# Patient Record
Sex: Male | Born: 1955
Health system: Southern US, Community
[De-identification: ages and names within clinical notes are randomized; demographics above are authoritative.]

## PROBLEM LIST (undated history)

## (undated) DIAGNOSIS — Z86010 Personal history of colonic polyps: Secondary | ICD-10-CM

## (undated) DIAGNOSIS — N481 Balanitis: Secondary | ICD-10-CM

## (undated) DIAGNOSIS — L309 Dermatitis, unspecified: Secondary | ICD-10-CM

## (undated) DIAGNOSIS — M545 Low back pain, unspecified: Secondary | ICD-10-CM

## (undated) DIAGNOSIS — N400 Enlarged prostate without lower urinary tract symptoms: Secondary | ICD-10-CM

## (undated) DIAGNOSIS — E538 Deficiency of other specified B group vitamins: Secondary | ICD-10-CM

## (undated) DIAGNOSIS — M25562 Pain in left knee: Secondary | ICD-10-CM

## (undated) DIAGNOSIS — K649 Unspecified hemorrhoids: Secondary | ICD-10-CM

## (undated) DIAGNOSIS — K219 Gastro-esophageal reflux disease without esophagitis: Secondary | ICD-10-CM

## (undated) DIAGNOSIS — M179 Osteoarthritis of knee, unspecified: Secondary | ICD-10-CM

## (undated) DIAGNOSIS — M1710 Unilateral primary osteoarthritis, unspecified knee: Secondary | ICD-10-CM

## (undated) DIAGNOSIS — S83249A Other tear of medial meniscus, current injury, unspecified knee, initial encounter: Secondary | ICD-10-CM

## (undated) HISTORY — DX: Pain in left knee: M25.562

## (undated) HISTORY — PX: COLONOSCOPY: SHX174

## (undated) HISTORY — DX: Personal history of colonic polyps: Z86.010

## (undated) HISTORY — DX: Other tear of medial meniscus, current injury, unspecified knee, initial encounter: S83.249A

---

## 1998-07-21 ENCOUNTER — Encounter: Payer: Self-pay | Admitting: Internal Medicine

## 1998-07-21 ENCOUNTER — Other Ambulatory Visit: Admission: RE | Admit: 1998-07-21 | Discharge: 1998-07-21 | Payer: Self-pay | Admitting: Internal Medicine

## 2002-03-14 ENCOUNTER — Ambulatory Visit (HOSPITAL_COMMUNITY): Admission: RE | Admit: 2002-03-14 | Discharge: 2002-03-14 | Payer: Self-pay | Admitting: Internal Medicine

## 2002-03-14 ENCOUNTER — Encounter: Payer: Self-pay | Admitting: Internal Medicine

## 2004-03-20 ENCOUNTER — Encounter: Admission: RE | Admit: 2004-03-20 | Discharge: 2004-03-20 | Payer: Self-pay | Admitting: Internal Medicine

## 2004-11-29 HISTORY — PX: OTHER SURGICAL HISTORY: SHX169

## 2005-05-06 ENCOUNTER — Ambulatory Visit: Payer: Self-pay | Admitting: Internal Medicine

## 2005-10-13 ENCOUNTER — Ambulatory Visit: Payer: Self-pay | Admitting: Internal Medicine

## 2005-12-16 ENCOUNTER — Ambulatory Visit: Payer: Self-pay | Admitting: Internal Medicine

## 2005-12-24 ENCOUNTER — Ambulatory Visit: Payer: Self-pay | Admitting: Cardiology

## 2006-02-17 ENCOUNTER — Ambulatory Visit: Payer: Self-pay | Admitting: Internal Medicine

## 2006-03-18 ENCOUNTER — Ambulatory Visit: Payer: Self-pay | Admitting: Internal Medicine

## 2006-03-23 ENCOUNTER — Encounter (INDEPENDENT_AMBULATORY_CARE_PROVIDER_SITE_OTHER): Payer: Self-pay | Admitting: *Deleted

## 2006-03-23 ENCOUNTER — Ambulatory Visit: Payer: Self-pay | Admitting: Internal Medicine

## 2006-03-30 ENCOUNTER — Encounter: Payer: Self-pay | Admitting: Internal Medicine

## 2006-05-04 ENCOUNTER — Ambulatory Visit: Payer: Self-pay | Admitting: Internal Medicine

## 2006-09-06 ENCOUNTER — Ambulatory Visit: Payer: Self-pay | Admitting: Internal Medicine

## 2006-09-13 ENCOUNTER — Ambulatory Visit: Payer: Self-pay | Admitting: Internal Medicine

## 2007-02-03 ENCOUNTER — Ambulatory Visit: Payer: Self-pay | Admitting: Internal Medicine

## 2007-02-03 LAB — CONVERTED CEMR LAB
ALT: 17 units/L (ref 0–40)
AST: 17 units/L (ref 0–37)
Albumin: 4.1 g/dL (ref 3.5–5.2)
Alkaline Phosphatase: 77 units/L (ref 39–117)
BUN: 16 mg/dL (ref 6–23)
Basophils Absolute: 0 10*3/uL (ref 0.0–0.1)
Basophils Relative: 0.3 % (ref 0.0–1.0)
Bilirubin, Direct: 0.2 mg/dL (ref 0.0–0.3)
CO2: 32 meq/L (ref 19–32)
Calcium: 9.3 mg/dL (ref 8.4–10.5)
Chloride: 103 meq/L (ref 96–112)
Creatinine, Ser: 1.1 mg/dL (ref 0.4–1.5)
Eosinophils Absolute: 0 10*3/uL (ref 0.0–0.6)
Eosinophils Relative: 1.2 % (ref 0.0–5.0)
GFR calc Af Amer: 91 mL/min
GFR calc non Af Amer: 75 mL/min
Glucose, Bld: 96 mg/dL (ref 70–99)
HCT: 43.3 % (ref 39.0–52.0)
Hemoglobin: 14.6 g/dL (ref 13.0–17.0)
Lymphocytes Relative: 36.9 % (ref 12.0–46.0)
MCHC: 33.8 g/dL (ref 30.0–36.0)
MCV: 91.2 fL (ref 78.0–100.0)
Monocytes Absolute: 0.4 10*3/uL (ref 0.2–0.7)
Monocytes Relative: 16.3 % — ABNORMAL HIGH (ref 3.0–11.0)
Neutro Abs: 1.2 10*3/uL — ABNORMAL LOW (ref 1.4–7.7)
Neutrophils Relative %: 45.3 % (ref 43.0–77.0)
Platelets: 239 10*3/uL (ref 150–400)
Potassium: 4.8 meq/L (ref 3.5–5.1)
RBC: 4.75 M/uL (ref 4.22–5.81)
RDW: 12.8 % (ref 11.5–14.6)
Sed Rate: 6 mm/hr (ref 0–20)
Sodium: 139 meq/L (ref 135–145)
TSH: 1.91 microintl units/mL (ref 0.35–5.50)
Total Bilirubin: 1.4 mg/dL — ABNORMAL HIGH (ref 0.3–1.2)
Total Protein: 7.1 g/dL (ref 6.0–8.3)
WBC: 2.6 10*3/uL — ABNORMAL LOW (ref 4.5–10.5)

## 2007-02-09 ENCOUNTER — Ambulatory Visit: Payer: Self-pay | Admitting: Cardiology

## 2007-02-23 ENCOUNTER — Ambulatory Visit: Payer: Self-pay | Admitting: Endocrinology

## 2007-03-14 ENCOUNTER — Ambulatory Visit: Payer: Self-pay | Admitting: Internal Medicine

## 2007-03-29 ENCOUNTER — Ambulatory Visit: Payer: Self-pay | Admitting: Internal Medicine

## 2007-06-13 ENCOUNTER — Ambulatory Visit: Payer: Self-pay | Admitting: Internal Medicine

## 2007-08-03 DIAGNOSIS — R6889 Other general symptoms and signs: Secondary | ICD-10-CM | POA: Insufficient documentation

## 2007-10-21 ENCOUNTER — Ambulatory Visit: Payer: Self-pay | Admitting: Internal Medicine

## 2007-10-21 DIAGNOSIS — M6580 Other synovitis and tenosynovitis, unspecified site: Secondary | ICD-10-CM | POA: Insufficient documentation

## 2008-04-23 ENCOUNTER — Ambulatory Visit: Payer: Self-pay | Admitting: Endocrinology

## 2008-04-23 DIAGNOSIS — K649 Unspecified hemorrhoids: Secondary | ICD-10-CM | POA: Insufficient documentation

## 2008-04-23 DIAGNOSIS — L723 Sebaceous cyst: Secondary | ICD-10-CM | POA: Insufficient documentation

## 2008-04-29 ENCOUNTER — Encounter: Payer: Self-pay | Admitting: Internal Medicine

## 2008-04-30 ENCOUNTER — Ambulatory Visit: Payer: Self-pay | Admitting: Internal Medicine

## 2008-04-30 DIAGNOSIS — K219 Gastro-esophageal reflux disease without esophagitis: Secondary | ICD-10-CM | POA: Insufficient documentation

## 2008-04-30 DIAGNOSIS — R634 Abnormal weight loss: Secondary | ICD-10-CM | POA: Insufficient documentation

## 2008-05-22 ENCOUNTER — Encounter: Payer: Self-pay | Admitting: Internal Medicine

## 2008-07-26 ENCOUNTER — Ambulatory Visit: Payer: Self-pay | Admitting: Internal Medicine

## 2008-07-26 LAB — CONVERTED CEMR LAB
BUN: 24 mg/dL — ABNORMAL HIGH (ref 6–23)
Creatinine, Ser: 1.1 mg/dL (ref 0.4–1.5)
GFR calc Af Amer: 91 mL/min
Glucose, Bld: 96 mg/dL (ref 70–99)
Potassium: 4.6 meq/L (ref 3.5–5.1)

## 2008-07-29 ENCOUNTER — Ambulatory Visit: Payer: Self-pay | Admitting: Internal Medicine

## 2008-07-29 DIAGNOSIS — B009 Herpesviral infection, unspecified: Secondary | ICD-10-CM | POA: Insufficient documentation

## 2008-07-29 DIAGNOSIS — M549 Dorsalgia, unspecified: Secondary | ICD-10-CM | POA: Insufficient documentation

## 2008-10-07 ENCOUNTER — Ambulatory Visit: Payer: Self-pay | Admitting: Internal Medicine

## 2008-10-07 DIAGNOSIS — H5710 Ocular pain, unspecified eye: Secondary | ICD-10-CM | POA: Insufficient documentation

## 2008-11-11 ENCOUNTER — Ambulatory Visit: Payer: Self-pay | Admitting: Internal Medicine

## 2009-04-12 ENCOUNTER — Ambulatory Visit: Payer: Self-pay | Admitting: Family Medicine

## 2009-04-12 DIAGNOSIS — B369 Superficial mycosis, unspecified: Secondary | ICD-10-CM | POA: Insufficient documentation

## 2009-04-25 ENCOUNTER — Ambulatory Visit: Payer: Self-pay | Admitting: Internal Medicine

## 2009-04-25 DIAGNOSIS — R071 Chest pain on breathing: Secondary | ICD-10-CM | POA: Insufficient documentation

## 2009-04-25 DIAGNOSIS — IMO0002 Reserved for concepts with insufficient information to code with codable children: Secondary | ICD-10-CM | POA: Insufficient documentation

## 2009-06-03 ENCOUNTER — Ambulatory Visit: Payer: Self-pay | Admitting: Internal Medicine

## 2009-06-03 DIAGNOSIS — E538 Deficiency of other specified B group vitamins: Secondary | ICD-10-CM | POA: Insufficient documentation

## 2009-06-03 DIAGNOSIS — M25539 Pain in unspecified wrist: Secondary | ICD-10-CM | POA: Insufficient documentation

## 2009-06-04 LAB — CONVERTED CEMR LAB
TSH: 2 microintl units/mL (ref 0.35–5.50)
Vitamin B-12: 262 pg/mL (ref 211–911)

## 2009-06-06 ENCOUNTER — Encounter: Payer: Self-pay | Admitting: Internal Medicine

## 2009-06-06 LAB — CONVERTED CEMR LAB: Vit D, 25-Hydroxy: 7 ng/mL — ABNORMAL LOW (ref 30–89)

## 2010-01-01 ENCOUNTER — Ambulatory Visit: Payer: Self-pay | Admitting: Internal Medicine

## 2010-01-01 DIAGNOSIS — M79609 Pain in unspecified limb: Secondary | ICD-10-CM | POA: Insufficient documentation

## 2010-01-01 LAB — CONVERTED CEMR LAB
Sed Rate: 3 mm/hr (ref 0–22)
Uric Acid, Serum: 5.5 mg/dL (ref 4.0–7.8)

## 2010-01-05 ENCOUNTER — Telehealth: Payer: Self-pay | Admitting: Internal Medicine

## 2010-01-06 ENCOUNTER — Encounter: Payer: Self-pay | Admitting: Internal Medicine

## 2010-01-16 ENCOUNTER — Ambulatory Visit: Payer: Self-pay | Admitting: Internal Medicine

## 2010-01-23 ENCOUNTER — Ambulatory Visit: Payer: Self-pay | Admitting: Internal Medicine

## 2010-01-23 DIAGNOSIS — N400 Enlarged prostate without lower urinary tract symptoms: Secondary | ICD-10-CM | POA: Insufficient documentation

## 2010-01-23 DIAGNOSIS — K921 Melena: Secondary | ICD-10-CM | POA: Insufficient documentation

## 2010-01-26 ENCOUNTER — Encounter (INDEPENDENT_AMBULATORY_CARE_PROVIDER_SITE_OTHER): Payer: Self-pay | Admitting: *Deleted

## 2010-01-26 ENCOUNTER — Telehealth: Payer: Self-pay | Admitting: Internal Medicine

## 2010-01-27 ENCOUNTER — Ambulatory Visit: Payer: Self-pay | Admitting: Internal Medicine

## 2010-01-27 DIAGNOSIS — K625 Hemorrhage of anus and rectum: Secondary | ICD-10-CM | POA: Insufficient documentation

## 2010-01-27 DIAGNOSIS — R9389 Abnormal findings on diagnostic imaging of other specified body structures: Secondary | ICD-10-CM | POA: Insufficient documentation

## 2010-01-27 DIAGNOSIS — N509 Disorder of male genital organs, unspecified: Secondary | ICD-10-CM | POA: Insufficient documentation

## 2010-01-27 DIAGNOSIS — R350 Frequency of micturition: Secondary | ICD-10-CM | POA: Insufficient documentation

## 2010-02-04 ENCOUNTER — Encounter: Payer: Self-pay | Admitting: Internal Medicine

## 2010-02-19 ENCOUNTER — Encounter: Payer: Self-pay | Admitting: Internal Medicine

## 2010-03-03 ENCOUNTER — Ambulatory Visit: Payer: Self-pay | Admitting: Internal Medicine

## 2010-03-03 DIAGNOSIS — N433 Hydrocele, unspecified: Secondary | ICD-10-CM | POA: Insufficient documentation

## 2010-03-03 DIAGNOSIS — Z860100 Personal history of colon polyps, unspecified: Secondary | ICD-10-CM

## 2010-03-03 DIAGNOSIS — R351 Nocturia: Secondary | ICD-10-CM | POA: Insufficient documentation

## 2010-03-03 HISTORY — DX: Personal history of colonic polyps: Z86.010

## 2010-03-03 HISTORY — DX: Personal history of colon polyps, unspecified: Z86.0100

## 2010-03-16 ENCOUNTER — Ambulatory Visit: Payer: Self-pay | Admitting: Internal Medicine

## 2010-03-19 ENCOUNTER — Encounter: Payer: Self-pay | Admitting: Internal Medicine

## 2010-03-23 ENCOUNTER — Ambulatory Visit: Payer: Self-pay | Admitting: Internal Medicine

## 2010-03-23 DIAGNOSIS — D235 Other benign neoplasm of skin of trunk: Secondary | ICD-10-CM | POA: Insufficient documentation

## 2010-03-23 DIAGNOSIS — L309 Dermatitis, unspecified: Secondary | ICD-10-CM | POA: Insufficient documentation

## 2010-10-19 ENCOUNTER — Ambulatory Visit: Payer: Self-pay | Admitting: Internal Medicine

## 2010-10-19 DIAGNOSIS — B029 Zoster without complications: Secondary | ICD-10-CM | POA: Insufficient documentation

## 2010-10-19 DIAGNOSIS — M542 Cervicalgia: Secondary | ICD-10-CM | POA: Insufficient documentation

## 2010-10-19 DIAGNOSIS — M25569 Pain in unspecified knee: Secondary | ICD-10-CM | POA: Insufficient documentation

## 2010-10-29 ENCOUNTER — Encounter: Payer: Self-pay | Admitting: Internal Medicine

## 2010-11-03 ENCOUNTER — Ambulatory Visit: Payer: Self-pay | Admitting: Internal Medicine

## 2010-11-04 ENCOUNTER — Ambulatory Visit: Payer: Self-pay | Admitting: Internal Medicine

## 2010-11-04 LAB — CONVERTED CEMR LAB
Albumin: 4.1 g/dL (ref 3.5–5.2)
Basophils Relative: 0.9 % (ref 0.0–3.0)
CO2: 28 meq/L (ref 19–32)
Chloride: 108 meq/L (ref 96–112)
Cholesterol: 228 mg/dL — ABNORMAL HIGH (ref 0–200)
Eosinophils Absolute: 0 10*3/uL (ref 0.0–0.7)
Hemoglobin: 15.2 g/dL (ref 13.0–17.0)
Lymphs Abs: 1 10*3/uL (ref 0.7–4.0)
MCHC: 33.9 g/dL (ref 30.0–36.0)
MCV: 91.7 fL (ref 78.0–100.0)
Monocytes Absolute: 0.4 10*3/uL (ref 0.1–1.0)
Neutro Abs: 1.1 10*3/uL — ABNORMAL LOW (ref 1.4–7.7)
PSA: 2.6 ng/mL (ref 0.10–4.00)
RBC: 4.89 M/uL (ref 4.22–5.81)
Sodium: 144 meq/L (ref 135–145)
TSH: 1.74 microintl units/mL (ref 0.35–5.50)
Total CHOL/HDL Ratio: 3
Total Protein: 6.9 g/dL (ref 6.0–8.3)
Urine Glucose: NEGATIVE mg/dL
Urobilinogen, UA: 0.2 (ref 0.0–1.0)

## 2010-11-13 ENCOUNTER — Ambulatory Visit
Admission: RE | Admit: 2010-11-13 | Discharge: 2010-11-13 | Payer: Self-pay | Source: Home / Self Care | Attending: Urology | Admitting: Urology

## 2010-11-13 HISTORY — PX: OTHER SURGICAL HISTORY: SHX169

## 2010-11-27 ENCOUNTER — Encounter: Payer: Self-pay | Admitting: Internal Medicine

## 2010-12-31 NOTE — Letter (Signed)
Summary: [facility] Instructions  [facility]  [address] [place], [place] [address]   Phone: [phone]  Fax: [phone]       Jorge Turner    [birth year]    MRN: [id number]        Procedure Day /Date:MONDAY, 03/16/10     Arrival Time:1:30 PM     Procedure Time:2:30 PM     Location of Procedure:                    X  [facility] (4th Floor)                        PREPARATION FOR COLONOSCOPY WITH MOVIPREP   Starting 5 days prior to your procedure 03/11/10 do not eat nuts, seeds, popcorn, corn, beans, peas,  salads, or any raw vegetables.  Do not take any fiber supplements (e.g. Metamucil, Citrucel, and Benefiber).  THE DAY BEFORE YOUR PROCEDURE         DATE: 03/15/10  DAY: SUNDAY  1.  Drink clear liquids the entire day-NO SOLID FOOD  2.  Do not drink anything colored red or purple.  Avoid juices with pulp.  No orange juice.  3.  Drink at least 64 oz. (8 glasses) of fluid/clear liquids during the day to prevent dehydration and help the prep work efficiently.  CLEAR LIQUIDS INCLUDE: Water Jello Ice Popsicles Tea (sugar ok, no milk/cream) Powdered fruit flavored drinks Coffee (sugar ok, no milk/cream) Gatorade Juice: apple, white grape, white cranberry  Lemonade Clear bullion, consomm, broth Carbonated beverages (any kind) Strained chicken noodle soup Hard Candy                             4.  In the morning, mix first dose of MoviPrep solution:    Empty 1 Pouch A and 1 Pouch B into the disposable container    Add lukewarm drinking water to the top line of the container. Mix to dissolve    Refrigerate (mixed solution should be used within 24 hrs)  5.  Begin drinking the prep at 5:00 p.m. The MoviPrep container is divided by 4 marks.   Every 15 minutes drink the solution down to the next mark (approximately 8 oz) until the full liter is complete.   6.  Follow completed prep with 16 oz of clear liquid of your choice (Nothing red or  purple).  Continue to drink clear liquids until bedtime.  7.  Before going to bed, mix second dose of MoviPrep solution:    Empty 1 Pouch A and 1 Pouch B into the disposable container    Add lukewarm drinking water to the top line of the container. Mix to dissolve    Refrigerate  THE DAY OF YOUR PROCEDURE      DATE: 03/16/10 DAY: MONDAY  Beginning at 9:30 a.m. (5 hours before procedure):         1. Every 15 minutes, drink the solution down to the next mark (approx 8 oz) until the full liter is complete.  2. Follow completed prep with 16 oz. of clear liquid of your choice.    3. You may drink clear liquids until 12:30 PM(2 HOURS BEFORE PROCEDURE).   MEDICATION INSTRUCTIONS  Unless otherwise instructed, you should take regular prescription medications with a small sip of water   as early as possible the morning of your procedure.  OTHER INSTRUCTIONS  You will need a responsible adult at least 54 years of age to accompany you and drive you home.   This person must remain in the waiting room during your procedure.  Wear loose fitting clothing that is easily removed.  Leave jewelry and other valuables at home.  However, you may wish to bring a book to read or  an iPod/MP3 player to listen to music as you wait for your procedure to start.  Remove all body piercing jewelry and leave at home.  Total time from sign-in until discharge is approximately 2-3 hours.  You should go home directly after your procedure and rest.  You can resume normal activities the  day after your procedure.  The day of your procedure you should not:   Drive   Make legal decisions   Operate machinery   Drink alcohol   Return to work  You will receive specific instructions about eating, activities and medications before you leave.    The above instructions have been reviewed and explained to me by   _______________________    I fully understand and can verbalize these instructions  _____________________________ Date _________

## 2010-12-31 NOTE — Procedures (Signed)
Summary: Colonoscopy  Patient: [name] Note: All result statuses are Final unless otherwise noted.  Tests: (1) Colonoscopy (COL)   COL Colonoscopy           DONE     [facility]     [address]     [place], [place]  [address]           COLONOSCOPY PROCEDURE REPORT           PATIENT:  Jorge, Turner  MR#:  [id number]     BIRTHDATE:  December 10, 1955, 54 yrs. old  GENDER:  male     ENDOSCOPIST:  Wilhemina Bonito. Eda Keys, MD     REF. BY:  Office     PROCEDURE DATE:  03/16/2010     PROCEDURE:  Colonoscopy with snare polypectomy     ASA CLASS:  Class I     INDICATIONS:  rectal bleeding ; hx nonadenomatous polyp 2007     MEDICATIONS:   Fentanyl 75 mcg IV, Versed 8 mg IV           DESCRIPTION OF PROCEDURE:   After the risks benefits and     alternatives of the procedure were thoroughly explained, informed     consent was obtained.  Digital rectal exam was performed and     revealed no abnormalities.   The LB CF-[id number] endoscope     was introduced through the anus and advanced to the cecum, which     was identified by both the appendix and ileocecal valve, without     limitations. Time to cecum = 1:55 min.The quality of the prep was     good, using MoviPrep.  The instrument was then slowly withdrawn     (time = 10:17 min) as the colon was fully examined.     <<PROCEDUREIMAGES>>           FINDINGS:  Severe diverticulosis was found throughout the colon.     A diminutive polyp was found in the ascending colon. Polyp was     snared without cautery. Retrieval was successful. snare polyp     Retroflexed views in the rectum revealed internal hemorrhoids.     The scope was then withdrawn from the patient and the procedure     completed.           COMPLICATIONS:  None     ENDOSCOPIC IMPRESSION:     1) Severe diverticulosis throughout the colon     2) Diminutive polyp in the ascending colon     3) Internal hemorrhoids           RECOMMENDATIONS:     1) Repeat colonoscopy in 5 years  if polyp adenomatous; otherwise     10 years           ______________________________     Wilhemina Bonito. Eda Keys, MD           CC:  The Patient; Oliver Barre, MD           n.     Rosalie DoctorWilhemina Bonito. Eda Keys at 03/16/2010 03:23 PM           [name], [id number]  Note: An exclamation mark (!) indicates a result that was not dispersed into the flowsheet. Document Creation Date: 03/16/2010 3:24 PM _______________________________________________________________________  (1) Order result status: Final Collection or observation date-time: 03/16/2010 15:14 Requested date-time:  Receipt date-time:  Reported date-time:  Referring Physician:   Ordering Physician: Yancey Flemings  Montez Hageman [phone]) Specimen Source:  Source: Launa Grill Order Number: [id number] Lab site:   Appended Document: Colonoscopy recall 10 yrs/02-2020     Procedures Next Due Date:    Colonoscopy: 02/2020

## 2010-12-31 NOTE — Medication Information (Signed)
Summary: Prior authorization/[employer]  Prior authorization/[employer]   Imported By: Lester  01/07/2010 08:45:19  _____________________________________________________________________  External Attachment:    Type:   Image     Comment:   External Document
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
## 2010-12-31 NOTE — Consult Note (Signed)
Summary: [facility]   Imported By: Sherian Rein 02/16/2010 08:02:38  _____________________________________________________________________  External Attachment:    Type:   Image     Comment:   External Document

## 2010-12-31 NOTE — Letter (Signed)
Summary: [facility]  [facility]   Imported By: Lennie Odor 12/04/2010 14:30:02  _____________________________________________________________________  External Attachment:    Type:   Image     Comment:   External Document

## 2010-12-31 NOTE — Letter (Signed)
Summary: [facility]  [facility]   Imported By: Sherian Rein 03/02/2010 08:01:41  _____________________________________________________________________  External Attachment:    Type:   Image     Comment:   External Document

## 2010-12-31 NOTE — Progress Notes (Signed)
Summary: Appt sooner than next available   Phone Note From Other Clinic   Caller: Corrie Dandy [phone] @ DR Jonny Ruiz Call For: Dr Marina Goodell Reason for Call: Schedule Patient Appt Summary of Call: Hematochezia wonders if he can be worked in a little sooner than 03-05-10. Call back to [facility] in primary care. Initial call taken by: Leanor Kail [facility],  January 26, 2010 8:50 AM  Follow-up for Phone Call         Pt. given Appt. with Gunnar Fusi Guenther,N.P. for tomorrow.Is aware. Follow-up by: Teryl Lucy RN,  January 26, 2010 10:15 AM

## 2010-12-31 NOTE — Letter (Signed)
Summary: New Patient letter  [facility]  [address] [place], [place] [address]   Phone: [phone]  Fax: [phone]       01/26/2010 MRN: [id number]  [facility] [address] [place], [place]  [address]  Dear Mr. LOVER,  Welcome to the Gastroenterology Division at [facility].    You are scheduled to see Dr.  Marina Goodell on 03-05-10 at  9:30AM _ on the 3rd floor at [facility], [address]  We ask that you try to arrive at our office 15 minutes prior to your appointment time to allow for check-in.  We would like you to complete the enclosed self-administered evaluation form prior to your visit and bring it with you on the day of your appointment.  We will review it with you.  Also, please bring a complete list of all your medications or, if you prefer, bring the medication bottles and we will list them.  Please bring your insurance card so that we may make a copy of it.  If your insurance requires a referral to see a specialist, please bring your referral form from your primary care physician.  Co-payments are due at the time of your visit and may be paid by cash, check or credit card.     Your office visit will consist of a consult with your physician (includes a physical exam), any laboratory testing he/she may order, scheduling of any necessary diagnostic testing (e.g. x-ray, ultrasound, CT-scan), and scheduling of a procedure (e.g. Endoscopy, Colonoscopy) if required.  Please allow enough time on your schedule to allow for any/all of these possibilities.    If you cannot keep your appointment, please call [phone] to cancel or reschedule prior to your appointment date.  This allows [place] the opportunity to schedule an appointment for another patient in need of care.  If you do not cancel or reschedule by 5 p.m. the business day prior to your appointment date, you will be charged a $50.00 late cancellation/no-show fee.    Thank you for choosing  [facility] for your medical needs.  We appreciate the opportunity to care for you.  Please visit [place] at our website  to learn more about our practice.                     Sincerely,                                                             The Gastroenterology Division
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
## 2010-12-31 NOTE — Progress Notes (Signed)
Summary: Meloxicam PA  Phone Note From Pharmacy   Caller: [employer] [phone] Call For: ID:  [id number]  Summary of Call: PA request--Meloxicam. They will fax form. Initial call taken by: Lucious Groves,  January 05, 2010 10:24 AM  Follow-up for Phone Call        Completed. What is the expected duration of the Meloxicam therapy? Please advise. Follow-up by: Lucious Groves,  January 05, 2010 11:31 AM  Additional Follow-up for Phone Call Additional follow up Details #1::        1-2 months  Additional Follow-up by: Tresa Garter MD,  January 05, 2010 11:51 AM    Additional Follow-up for Phone Call Additional follow up Details #2::    form faxed, will wait for reply. Follow-up by: Lucious Groves,  January 06, 2010 11:55 AM  Additional Follow-up for Phone Call Additional follow up Details #3:: Details for Additional Follow-up Action Taken: Called to check on status and it is denied due to non covered condition. Please advise. Lucious Groves  January 08, 2010 1:05 PM  OK Naproxen Tresa Garter MD  January 09, 2010 7:54 AM   Pt informed  Additional Follow-up by: Lamar Sprinkles, CMA,  January 09, 2010 9:49 AM  New/Updated Medications: NAPROXEN 500 MG TABS (NAPROXEN) 1 by mouth two times a day pc for pain/arthritis Prescriptions: NAPROXEN 500 MG TABS (NAPROXEN) 1 by mouth two times a day pc for pain/arthritis  #60 x 3   Entered and Authorized by:   Tresa Garter MD   Signed by:   Lamar Sprinkles, CMA on 01/09/2010   Method used:   [facility]        [facility] [address] [phone]* (retail)       [address]       [place], [place]  [address]       Ph: [phone] or [phone]       Fax: [phone]   RxID:   [id number]

## 2010-12-31 NOTE — Procedures (Signed)
Summary: LEC COLON   Colonoscopy  Procedure date:  03/23/2006  Findings:      Location:  [facility].    Procedures Next Due Date:    Colonoscopy: 03/2011 Patient Name: Jorge Turner, Jorge Turner MRN:  Procedure Procedures: Colonoscopy CPT: 95188.    with polypectomy. CPT: A3573898.  Personnel: Endoscopist: Wilhemina Bonito. Marina Goodell, MD.  Referred By: Linda Hedges Plotnikov, MD.  Exam Location: Exam performed in Outpatient Clinic. Outpatient  Patient Consent: Procedure, Alternatives, Risks and Benefits discussed, consent obtained, from patient. Consent was obtained by the RN.  Indications Symptoms: Hematochezia.  Comments: Colonoscopy 06-1998 revealed diverticulosis History  Current Medications: Patient is not currently taking Coumadin.  Pre-Exam Physical: Performed Mar 23, 2006. Cardio-pulmonary exam, Rectal exam, HEENT exam , Mental status exam WNL.  Comments: Pt. history reviewed/updated, physical exam performed prior to initiation of sedation?yes Exam Exam: Extent of exam reached: Cecum, extent intended: Cecum.  The cecum was identified by appendiceal orifice and IC valve. Patient position: on left side. The Cecum was reached at 12:24 PM. ended at 12:35 PM. Time for Withdrawl: 00:11. Colon retroflexion performed. Images taken. ASA Classification: I. Tolerance: excellent.  Monitoring: Pulse and BP monitoring, Oximetry used. Supplemental O2 given.  Colon Prep Used osmo prep for colon prep. Prep results: excellent.  Sedation Meds: Patient assessed and found to be appropriate for moderate (conscious) sedation. Fentanyl 50 mcg. given IV. Versed 6 mg. given IV.  Findings NORMAL EXAM: Cecum to Rectum.  POLYP: Cecum, Maximum size: 5 mm. pedunculated polyp. Procedure:  snare without cautery, removed, retrieved, Polyp sent to pathology. ICD9: Colon Polyps: 211.3. Comments: mild oozing at base cauterized.  - DIVERTICULOSIS: Cecum to Sigmoid Colon. ICD9: Diverticulosis,  Colon: 562.10. Comments: moderate changes.   Assessment  Diagnoses: 562.10: Diverticulosis, Colon.  211.3: Colon Polyps.   Events  Unplanned Interventions: No intervention was required.  Unplanned Events: There were no complications. Plans Disposition: After procedure patient sent to recovery. After recovery patient sent home.  Scheduling/Referral: Colonoscopy, to Wilhemina Bonito. Marina Goodell, MD, in 5 years,    cc:  Linda Hedges. Plotnikov, MD  This report was created from the original endoscopy report, which was reviewed and signed by the above listed endoscopist.

## 2010-12-31 NOTE — Letter (Signed)
Summary: Patient Notice- Polyp Results  [facility]  [address] [place], [place] [address]   Phone: [phone]  Fax: [phone]        March 19, 2010 MRN: [id number]    [facility] [address] [place], [place]  [address]    Dear Mr. ROBISON,  I am pleased to inform you that the colon polyp(s) removed during your recent colonoscopy was (were) found to be benign (no cancer detected) upon pathologic examination.  I recommend you have a repeat colonoscopy examination in 10 years to look for recurrent polyps, as having colon polyps increases your risk for having recurrent polyps or even colon cancer in the future.  Should you develop new or worsening symptoms of abdominal pain, bowel habit changes or bleeding from the rectum or bowels, please schedule an evaluation with either your primary care physician or with me.  Additional information/recommendations:  __ No further action with gastroenterology is needed at this time. Please      follow-up with your primary care physician for your other healthcare      needs.   Please call us if you are having persistent problems or have questions about your condition that have not been fully answered at this time.  Sincerely,  Hilarie Fredrickson MD  This letter has been electronically signed by your physician.  Appended Document: Patient Notice- Polyp Results letter mailed 4.26.11

## 2010-12-31 NOTE — Procedures (Signed)
Summary: [facility]   Imported By: Sherian Rein 03/04/2010 10:56:20  _____________________________________________________________________  External Attachment:    Type:   Image     Comment:   External Document

## 2010-12-31 NOTE — Letter (Signed)
Summary: [facility]  [facility]   Imported By: Lennie Odor 11/03/2010 15:16:41  _____________________________________________________________________  External Attachment:    Type:   Image     Comment:   External Document
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
## 2011-01-25 ENCOUNTER — Ambulatory Visit (INDEPENDENT_AMBULATORY_CARE_PROVIDER_SITE_OTHER): Payer: BC Managed Care – PPO | Admitting: Internal Medicine

## 2011-01-25 ENCOUNTER — Encounter: Payer: Self-pay | Admitting: Internal Medicine

## 2011-01-25 DIAGNOSIS — Z0000 Encounter for general adult medical examination without abnormal findings: Secondary | ICD-10-CM
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
## 2011-02-08 LAB — POCT HEMOGLOBIN-HEMACUE: Hemoglobin: 15.5 g/dL (ref 13.0–17.0)

## 2011-04-05 ENCOUNTER — Ambulatory Visit (INDEPENDENT_AMBULATORY_CARE_PROVIDER_SITE_OTHER): Payer: BC Managed Care – PPO | Admitting: Internal Medicine

## 2011-04-05 ENCOUNTER — Encounter: Payer: Self-pay | Admitting: Internal Medicine

## 2011-04-05 DIAGNOSIS — K219 Gastro-esophageal reflux disease without esophagitis: Secondary | ICD-10-CM

## 2011-04-05 DIAGNOSIS — R109 Unspecified abdominal pain: Secondary | ICD-10-CM | POA: Insufficient documentation

## 2011-04-05 DIAGNOSIS — R110 Nausea: Secondary | ICD-10-CM

## 2011-04-05 DIAGNOSIS — R519 Headache, unspecified: Secondary | ICD-10-CM | POA: Insufficient documentation

## 2011-04-05 DIAGNOSIS — R51 Headache: Secondary | ICD-10-CM

## 2011-04-05 DIAGNOSIS — J029 Acute pharyngitis, unspecified: Secondary | ICD-10-CM

## 2011-04-05 DIAGNOSIS — R1031 Right lower quadrant pain: Secondary | ICD-10-CM

## 2011-04-05 MED ORDER — OMEPRAZOLE 40 MG PO CPDR: 40.0000 mg | DELAYED_RELEASE_CAPSULE | Freq: Every day | ORAL | Status: DC

## 2011-04-05 MED ORDER — BUTALBITAL-APAP-CAFFEINE 50-325-40 MG PO TABS: 1.0000 | ORAL_TABLET | Freq: Two times a day (BID) | ORAL | Status: AC | PRN

## 2011-04-05 MED ORDER — AMOXICILLIN-POT CLAVULANATE 875-125 MG PO TABS: 1.0000 | ORAL_TABLET | Freq: Two times a day (BID) | ORAL | Status: AC

## 2011-04-05 NOTE — Assessment & Plan Note (Signed)
Worse - restart Rx

## 2011-04-05 NOTE — Patient Instructions (Signed)
Come back if not better

## 2011-04-05 NOTE — Assessment & Plan Note (Signed)
RTC if not better or if worse

## 2011-04-05 NOTE — Assessment & Plan Note (Signed)
Sinusitis vs migraine.

## 2011-04-05 NOTE — Assessment & Plan Note (Signed)
Better off Doxy

## 2011-04-05 NOTE — Assessment & Plan Note (Signed)
We will use another abx

## 2011-04-05 NOTE — Progress Notes (Signed)
  Subjective:    Patient ID: Jorge Turner, male    DOB: 09-11-1956, 55 y.o.   MRN: [id number]  HPI   C/o HA C/o sinus infection Dr Donnetta Simpers gave him Doxy - he got very nauseated - he stopped after 4 days C/o RLQ discomfort and GERD sx's  Review of Systems  Constitutional: Positive for appetite change (not hungry). Negative for fever and chills.  HENT: Negative for ear pain, nosebleeds, rhinorrhea, sneezing, neck pain and postnasal drip.   Eyes: Negative for discharge.  Gastrointestinal: Positive for nausea and abdominal pain (mild). Negative for vomiting and abdominal distention.  Genitourinary: Negative for flank pain.  Skin: Negative for rash.  Psychiatric/Behavioral: Negative for dysphoric mood.       Objective:   Physical Exam  Constitutional: He is oriented to person, place, and time. He appears well-developed. No distress.  HENT:  Mouth/Throat: Oropharynx is clear and moist. No oropharyngeal exudate.       eryth pharynx  Eyes: Conjunctivae are normal. Pupils are equal, round, and reactive to light.  Neck: Normal range of motion. No JVD present. No thyromegaly present.  Cardiovascular: Normal rate, regular rhythm, normal heart sounds and intact distal pulses.  Exam reveals no gallop and no friction rub.   No murmur heard. Pulmonary/Chest: Effort normal and breath sounds normal. No respiratory distress. He has no wheezes. He has no rales. He exhibits no tenderness.  Abdominal: Soft. Bowel sounds are normal. He exhibits no distension and no mass. There is no tenderness. There is no rebound and no guarding.       RLQ is hardly sensitive to deep palpation  Musculoskeletal: Normal range of motion. He exhibits no edema and no tenderness.  Lymphadenopathy:    He has no cervical adenopathy.  Neurological: He is alert and oriented to person, place, and time. He has normal reflexes. No cranial nerve deficit. He exhibits normal muscle tone. Coordination normal.  Skin: Skin is warm and  dry. No rash noted.  Psychiatric: He has a normal mood and affect. His behavior is normal. Judgment and thought content normal.          Assessment & Plan:  PHARYNGITIS We will use another abx  Nausea Better off Doxy  Headache Sinusitis vs migraine.  RLQ abdominal pain RTC if not better or if worse  GERD Worse - restart Rx

## 2011-06-01 ENCOUNTER — Ambulatory Visit (INDEPENDENT_AMBULATORY_CARE_PROVIDER_SITE_OTHER): Payer: BC Managed Care – PPO | Admitting: Internal Medicine

## 2011-06-01 ENCOUNTER — Other Ambulatory Visit: Payer: Self-pay | Admitting: Internal Medicine

## 2011-06-01 ENCOUNTER — Encounter: Payer: Self-pay | Admitting: Internal Medicine

## 2011-06-01 ENCOUNTER — Other Ambulatory Visit (INDEPENDENT_AMBULATORY_CARE_PROVIDER_SITE_OTHER): Payer: BC Managed Care – PPO

## 2011-06-01 DIAGNOSIS — N342 Other urethritis: Secondary | ICD-10-CM

## 2011-06-01 DIAGNOSIS — J029 Acute pharyngitis, unspecified: Secondary | ICD-10-CM | POA: Insufficient documentation

## 2011-06-01 LAB — URINALYSIS
Ketones, ur: NEGATIVE
Specific Gravity, Urine: 1.025 (ref 1.000–1.030)
Total Protein, Urine: NEGATIVE
Urine Glucose: NEGATIVE
Urobilinogen, UA: 0.2 (ref 0.0–1.0)
pH: 5.5 (ref 5.0–8.0)

## 2011-06-01 MED ORDER — LEVOFLOXACIN 500 MG PO TABS: 500.0000 mg | ORAL_TABLET | Freq: Every day | ORAL | Status: AC

## 2011-06-01 NOTE — Patient Instructions (Signed)
Call if problems 

## 2011-06-01 NOTE — Assessment & Plan Note (Signed)
Safe sex discussed Levaquin UA Urine GC and Chlam probe Declined blood tests

## 2011-06-01 NOTE — Progress Notes (Signed)
  Subjective:    Patient ID: Jorge Turner, male    DOB: [birth year], 55 y.o.   MRN: [id number]  HPI  C/o ST x 2 d, fever C/u urinary pain and burning (no d/c) x 1-2 wks. He broke up w/his long-term GF No rash  Review of Systems  Constitutional: Positive for fever. Negative for appetite change, fatigue and unexpected weight change.  HENT: Positive for sore throat. Negative for nosebleeds, congestion, sneezing, trouble swallowing and neck pain.   Eyes: Negative for itching and visual disturbance.  Respiratory: Negative for cough.   Cardiovascular: Negative for chest pain, palpitations and leg swelling.  Gastrointestinal: Negative for nausea, diarrhea, blood in stool and abdominal distention.  Genitourinary: Positive for dysuria. Negative for frequency and hematuria.  Musculoskeletal: Negative for back pain, joint swelling and gait problem.  Skin: Negative for rash.  Neurological: Negative for dizziness, tremors, speech difficulty and weakness.  Psychiatric/Behavioral: Negative for sleep disturbance, dysphoric mood and agitation. The patient is not nervous/anxious.        Objective:   Physical Exam  Constitutional: He is oriented to person, place, and time. He appears well-developed.  HENT:  Mouth/Throat: Oropharynx is clear and moist.       eryth throat  Eyes: Conjunctivae are normal. Pupils are equal, round, and reactive to light.  Neck: Normal range of motion. No JVD present. No thyromegaly present.  Cardiovascular: Normal rate, regular rhythm, normal heart sounds and intact distal pulses.  Exam reveals no gallop and no friction rub.   No murmur heard. Pulmonary/Chest: Effort normal and breath sounds normal. No respiratory distress. He has no wheezes. He has no rales. He exhibits no tenderness.  Abdominal: Soft. Bowel sounds are normal. He exhibits no distension and no mass. There is no tenderness. There is no rebound and no guarding.  Genitourinary: Penis normal. No penile  tenderness.       No rash or d/c  Musculoskeletal: Normal range of motion. He exhibits no edema and no tenderness.  Lymphadenopathy:    He has no cervical adenopathy.  Neurological: He is alert and oriented to person, place, and time. He has normal reflexes. No cranial nerve deficit. He exhibits normal muscle tone. Coordination normal.  Skin: Skin is warm and dry. No rash noted.  Psychiatric: He has a normal mood and affect. His behavior is normal. Judgment and thought content normal.          Assessment & Plan:

## 2011-06-01 NOTE — Assessment & Plan Note (Signed)
Start Abx 

## 2011-06-03 LAB — GC/CHLAMYDIA PROBE AMP, URINE

## 2011-06-05 LAB — GC/CHLAMYDIA PROBE AMP, URINE: Chlamydia, Swab/Urine, PCR: NEGATIVE

## 2011-06-07 ENCOUNTER — Telehealth: Payer: Self-pay | Admitting: Internal Medicine

## 2011-06-07 NOTE — Telephone Encounter (Signed)
Jorge Turner, please, inform patient that all urinary labs are normal Thx

## 2011-06-08 NOTE — Telephone Encounter (Signed)
Pt informed

## 2011-07-16 ENCOUNTER — Encounter: Payer: Self-pay | Admitting: *Deleted

## 2011-07-16 ENCOUNTER — Ambulatory Visit (INDEPENDENT_AMBULATORY_CARE_PROVIDER_SITE_OTHER): Payer: BC Managed Care – PPO | Admitting: Internal Medicine

## 2011-07-16 DIAGNOSIS — K219 Gastro-esophageal reflux disease without esophagitis: Secondary | ICD-10-CM

## 2011-07-16 DIAGNOSIS — R21 Rash and other nonspecific skin eruption: Secondary | ICD-10-CM | POA: Insufficient documentation

## 2011-07-16 DIAGNOSIS — J019 Acute sinusitis, unspecified: Secondary | ICD-10-CM

## 2011-07-16 MED ORDER — DOXYCYCLINE MONOHYDRATE 100 MG PO CAPS: 100.0000 mg | ORAL_CAPSULE | Freq: Two times a day (BID) | ORAL | Status: AC

## 2011-07-16 NOTE — Patient Instructions (Signed)
Take all new medications as prescribed Continue all other medications as before  

## 2011-07-16 NOTE — Progress Notes (Signed)
Subjective:    Patient ID: Jorge Turner, male    DOB: 1956/03/03, 55 y.o.   MRN: [id number]  HPI  Here with 3 days acute onset fever, facial pain and pain worst to behind the nose left more than right, pressure, general weakness and malaise, and greenish d/c, with slight ST, but little to no cough and Pt denies chest pain, increased sob or doe, wheezing, orthopnea, PND, increased LE swelling, palpitations, dizziness or syncope. Pt denies new neurological symptoms such as new headache, or facial or extremity weakness or numbness   Denies ongoing nasal allergy symptoms with clear congestion, itch and sneeze, cough or wheezing.  Pt denies polydipsia, polyuria.  Does have several red spots come up on the bilat shoudlers when awoke one morning a few days with minor itching but near gone now; no swelling or red streaks or axillary adenopathy.  Denies worsening reflux, dysphagia, abd pain, n/v, bowel change or blood.  Past Medical History  Diagnosis Date  . HERPES ZOSTER 10/19/2010  . VITAMIN B12 DEFICIENCY 06/03/2009  . HEMORRHOIDS 04/23/2008  . GERD 04/30/2008  . RECTAL BLEEDING 01/27/2010  . HEMATOCHEZIA 01/23/2010  . BENIGN PROSTATIC HYPERTROPHY 01/23/2010  . HYDROCELE, RIGHT 03/03/2010  . Unspecified disorder of male genital organs 01/27/2010  . ECZEMA 03/23/2010  . LOW BACK PAIN 07/29/2008  . CHEST PAIN 04/25/2009  . Personal history of colonic polyps 03/03/2010  . COLD SORE 07/29/2008  . DYSPLASTIC NEVUS, CHEST 03/23/2010   Past Surgical History  Procedure Date  . Thumb surgery     reports that he has never smoked. He does not have any smokeless tobacco history on file. He reports that he does not drink alcohol or use illicit drugs. family history includes Diabetes in his father and mother and Hypertension in his father and mother. Allergies  Allergen Reactions  . Doxycycline     nausea  . Erythromycin Ethylsuccinate     Current Outpatient Prescriptions on File Prior to Visit  Medication Sig  Dispense Refill  . butalbital-acetaminophen-caffeine (FIORICET) 50-325-40 MG per tablet Take 1-2 tablets by mouth 2 (two) times daily as needed for headache.  60 tablet  0  . cholecalciferol (VITAMIN D) 1000 UNITS tablet Take 1,000 Units by mouth daily.        Marland Kitchen ibuprofen (ADVIL,MOTRIN) 600 MG tablet Take 600 mg by mouth as needed.        . Omega-3 Fatty Acids (FISH OIL) 1200 MG CAPS Take 1 capsule by mouth daily.        Marland Kitchen omeprazole (PRILOSEC) 40 MG capsule Take 1 capsule (40 mg total) by mouth daily.  30 capsule  6  . polyethylene glycol (MIRALAX / GLYCOLAX) packet Take 17 g by mouth daily.        . traMADol (ULTRAM) 50 MG tablet Take 50-100 mg by mouth 2 (two) times daily.        Marland Kitchen triamcinolone (KENALOG) 0.5 % cream Apply 1 application topically 2 (two) times daily.         Review of Systems Review of Systems  Constitutional: Negative for diaphoresis and unexpected weight change.  HENT: Negative for drooling and tinnitus.   Eyes: Negative for photophobia and visual disturbance.  Respiratory: Negative for choking and stridor.   Gastrointestinal: Negative for vomiting and blood in stool.  Genitourinary: Negative for hematuria and decreased urine volume.        Objective:   Physical Exam BP 100/72  Pulse 61  Temp(Src) 98.1 F (36.7 C) (Oral)  Ht 5\' 10"  (1.778 m)  Wt 148 lb 1.9 oz (67.187 kg)  BMI 21.25 kg/m2  SpO2 96% Physical Exam  VS noted Constitutional: Pt appears well-developed and well-nourished.  HENT: Head: Normocephalic.  Right Ear: External ear normal.  Left Ear: External ear normal.  Bilat tm's mild erythema.  Sinus nontender except marked tender to immediate left nasal area with mild nondiscrete paranasal swelling without erythema, fluctuance.  Pharynx mild erythema Eyes: Conjunctivae and EOM are normal. Pupils are equal, round, and reactive to light.  Neck: Normal range of motion. Neck supple.  Cardiovascular: Normal rate and regular rhythm.   Pulmonary/Chest:  Effort normal and breath sounds normal.  Neurological: Pt is alert. No cranial nerve deficit.  Skin: Skin is warm. No erythema except Has several very small erythem spots to the upper arms but nontender, nonraised      Assessment & Plan:

## 2011-07-18 ENCOUNTER — Encounter: Payer: Self-pay | Admitting: Internal Medicine

## 2011-07-18 NOTE — Assessment & Plan Note (Signed)
Minimal rash today, nontender, afeb and nonpruritic - ? Insect related, no evidence cellultitis, ok to  to f/u any worsening symptoms or concerns

## 2011-07-18 NOTE — Assessment & Plan Note (Signed)
Mild to mod, for antibx course,  to f/u any worsening symptoms or concerns 

## 2011-07-18 NOTE — Assessment & Plan Note (Signed)
stable overall by hx and exam, most recent data reviewed with pt, and pt to continue medical treatment as before  Lab Results  Component Value Date   WBC 2.5* 11/03/2010   HGB 15.5 11/13/2010   HCT 44.8 11/03/2010   PLT 229.0 11/03/2010   CHOL 228* 11/03/2010   TRIG 44.0 11/03/2010   HDL 79.00 11/03/2010   LDLDIRECT 125.7 11/03/2010   ALT 13 11/03/2010   AST 19 11/03/2010   NA 144 11/03/2010   K 4.4 11/03/2010   CL 108 11/03/2010   CREATININE 1.2 11/03/2010   BUN 19 11/03/2010   CO2 28 11/03/2010   TSH 1.74 11/03/2010   PSA 2.60 11/03/2010

## 2011-07-22 ENCOUNTER — Encounter: Payer: Self-pay | Admitting: Internal Medicine

## 2011-07-22 DIAGNOSIS — E538 Deficiency of other specified B group vitamins: Secondary | ICD-10-CM

## 2011-07-23 ENCOUNTER — Other Ambulatory Visit: Payer: BC Managed Care – PPO

## 2011-07-26 ENCOUNTER — Ambulatory Visit (INDEPENDENT_AMBULATORY_CARE_PROVIDER_SITE_OTHER): Payer: BC Managed Care – PPO | Admitting: Internal Medicine

## 2011-07-26 ENCOUNTER — Encounter: Payer: Self-pay | Admitting: Internal Medicine

## 2011-07-26 VITALS — BP 94/68 | HR 63 | Temp 98.2°F | Wt 147.0 lb

## 2011-07-26 DIAGNOSIS — M542 Cervicalgia: Secondary | ICD-10-CM

## 2011-07-26 DIAGNOSIS — R110 Nausea: Secondary | ICD-10-CM

## 2011-07-26 DIAGNOSIS — J019 Acute sinusitis, unspecified: Secondary | ICD-10-CM

## 2011-07-26 DIAGNOSIS — G8929 Other chronic pain: Secondary | ICD-10-CM

## 2011-07-26 MED ORDER — AMOXICILLIN 500 MG PO CAPS: 1000.0000 mg | ORAL_CAPSULE | Freq: Two times a day (BID) | ORAL | Status: AC

## 2011-07-26 NOTE — Assessment & Plan Note (Signed)
Start PT 

## 2011-07-26 NOTE — Assessment & Plan Note (Signed)
D/c Doxy 

## 2011-07-26 NOTE — Assessment & Plan Note (Signed)
Not better on Doxy Start Amoxicillin
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
## 2011-07-27 ENCOUNTER — Telehealth: Payer: Self-pay | Admitting: *Deleted

## 2011-07-27 DIAGNOSIS — Z0389 Encounter for observation for other suspected diseases and conditions ruled out: Secondary | ICD-10-CM

## 2011-07-27 DIAGNOSIS — Z0000 Encounter for general adult medical examination without abnormal findings: Secondary | ICD-10-CM

## 2011-07-27 NOTE — Telephone Encounter (Signed)
done

## 2011-07-27 NOTE — Telephone Encounter (Signed)
Message copied by Merrilyn Puma on Tue Jul 27, 2011 11:39 AM ------      Message from: Earl Lagos      Created: Mon Jul 26, 2011  1:53 PM      Regarding: labs       Please to sch cpx labs for appt 10/25/11

## 2011-10-25 ENCOUNTER — Ambulatory Visit (INDEPENDENT_AMBULATORY_CARE_PROVIDER_SITE_OTHER): Payer: BC Managed Care – PPO | Admitting: Internal Medicine

## 2011-10-25 ENCOUNTER — Encounter: Payer: Self-pay | Admitting: Internal Medicine

## 2011-10-25 VITALS — BP 112/68 | HR 77 | Temp 97.9°F | Wt 152.0 lb

## 2011-10-25 DIAGNOSIS — Z136 Encounter for screening for cardiovascular disorders: Secondary | ICD-10-CM

## 2011-10-25 DIAGNOSIS — E538 Deficiency of other specified B group vitamins: Secondary | ICD-10-CM

## 2011-10-25 DIAGNOSIS — Z0000 Encounter for general adult medical examination without abnormal findings: Secondary | ICD-10-CM

## 2011-10-25 DIAGNOSIS — N320 Bladder-neck obstruction: Secondary | ICD-10-CM | POA: Insufficient documentation

## 2011-10-25 DIAGNOSIS — B359 Dermatophytosis, unspecified: Secondary | ICD-10-CM | POA: Insufficient documentation

## 2011-10-25 MED ORDER — KETOCONAZOLE 200 MG PO TABS: 200.0000 mg | ORAL_TABLET | Freq: Every day | ORAL | Status: DC

## 2011-10-25 NOTE — Patient Instructions (Signed)
Pityriasis rosea -- rash Try Saw Palmetto capsules

## 2011-10-25 NOTE — Assessment & Plan Note (Addendum)
We can try meds. A trial of Saw Palmetto now [employer] offered

## 2011-10-25 NOTE — Progress Notes (Signed)
  Subjective:    Patient ID: Jorge Turner, male    DOB: September 14, 1956, 55 y.o.   MRN: [id number]  HPI The patient is here for a wellness exam. The patient has been doing well overall without major physical or psychological issues going on lately. C/o prostate issues x 1 year    Review of Systems  Constitutional: Negative for appetite change, fatigue and unexpected weight change.  HENT: Negative for nosebleeds, congestion, sore throat, sneezing, trouble swallowing and neck pain.   Eyes: Negative for itching and visual disturbance.  Respiratory: Negative for cough.   Cardiovascular: Negative for chest pain, palpitations and leg swelling.  Gastrointestinal: Negative for nausea, diarrhea, blood in stool and abdominal distention.  Genitourinary: Positive for urgency and difficulty urinating (rare). Negative for frequency, hematuria, decreased urine volume and scrotal swelling.  Musculoskeletal: Negative for back pain, joint swelling and gait problem.  Skin: Negative for rash.  Neurological: Negative for dizziness, tremors, speech difficulty and weakness.  Psychiatric/Behavioral: Negative for suicidal ideas, sleep disturbance, dysphoric mood and agitation. The patient is not nervous/anxious.        Objective:   Physical Exam  Constitutional: He is oriented to person, place, and time. He appears well-developed. No distress.  HENT:  Head: Normocephalic and atraumatic.  Right Ear: External ear normal.  Left Ear: External ear normal.  Nose: Nose normal.  Mouth/Throat: Oropharynx is clear and moist. No oropharyngeal exudate.  Eyes: Conjunctivae and EOM are normal. Pupils are equal, round, and reactive to light. Right eye exhibits no discharge. Left eye exhibits no discharge. No scleral icterus.  Neck: Normal range of motion. Neck supple. No JVD present. No tracheal deviation present. No thyromegaly present.  Cardiovascular: Normal rate, regular rhythm, normal heart sounds and intact distal pulses.   Exam reveals no gallop and no friction rub.   No murmur heard. Pulmonary/Chest: Effort normal and breath sounds normal. No stridor. No respiratory distress. He has no wheezes. He has no rales. He exhibits no tenderness.  Abdominal: Soft. Bowel sounds are normal. He exhibits no distension and no mass. There is no tenderness. There is no rebound and no guarding.  Genitourinary: Rectum normal and penis normal. Guaiac negative stool. No penile tenderness.       Prostate 1+  Musculoskeletal: Normal range of motion. He exhibits no edema and no tenderness.  Lymphadenopathy:    He has no cervical adenopathy.  Neurological: He is alert and oriented to person, place, and time. He has normal reflexes. No cranial nerve deficit. He exhibits normal muscle tone. Coordination normal.  Skin: Skin is warm and dry. Rash (hypopigmented maculas) noted. He is not diaphoretic. No erythema. No pallor.  Psychiatric: He has a normal mood and affect. His behavior is normal. Judgment and thought content normal.    Hypopigmented mucules - diffuse 1+ prostate EKG ok     Assessment & Plan:

## 2011-10-25 NOTE — Assessment & Plan Note (Signed)
We discussed age appropriate health related issues, including available/recomended screening tests and vaccinations. We discussed a need for adhering to healthy diet and exercise. Labs/EKG were reviewed/ordered. All questions were answered. EKG was ok

## 2011-10-25 NOTE — Assessment & Plan Note (Signed)
Ketoconazole po 2012

## 2011-10-26 ENCOUNTER — Other Ambulatory Visit: Payer: Self-pay | Admitting: Internal Medicine

## 2011-10-26 ENCOUNTER — Other Ambulatory Visit (INDEPENDENT_AMBULATORY_CARE_PROVIDER_SITE_OTHER): Payer: BC Managed Care – PPO

## 2011-10-26 DIAGNOSIS — Z0000 Encounter for general adult medical examination without abnormal findings: Secondary | ICD-10-CM

## 2011-10-26 DIAGNOSIS — Z0389 Encounter for observation for other suspected diseases and conditions ruled out: Secondary | ICD-10-CM

## 2011-10-26 LAB — HEPATIC FUNCTION PANEL
AST: 16 U/L (ref 0–37)
Albumin: 4.1 g/dL (ref 3.5–5.2)
Alkaline Phosphatase: 79 U/L (ref 39–117)
Total Protein: 7.3 g/dL (ref 6.0–8.3)

## 2011-10-26 LAB — URINALYSIS, ROUTINE W REFLEX MICROSCOPIC
Leukocytes, UA: NEGATIVE
Nitrite: NEGATIVE
Specific Gravity, Urine: 1.01 (ref 1.000–1.030)
Urobilinogen, UA: 0.2 (ref 0.0–1.0)

## 2011-10-26 LAB — LIPID PANEL
HDL: 75 mg/dL (ref >39.00)
Triglycerides: 53 mg/dL (ref 0.0–149.0)

## 2011-10-26 LAB — BASIC METABOLIC PANEL
BUN: 22 mg/dL (ref 6–23)
CO2: 27 mEq/L (ref 19–32)
GFR: 75.11 mL/min (ref >60.00)
Glucose, Bld: 95 mg/dL (ref 70–99)
Potassium: 4.7 mEq/L (ref 3.5–5.1)
Sodium: 139 mEq/L (ref 135–145)

## 2011-10-26 LAB — CBC WITH DIFFERENTIAL/PLATELET
Basophils Absolute: 0 10*3/uL (ref 0.0–0.1)
Basophils Relative: 0.6 % (ref 0.0–3.0)
Eosinophils Absolute: 0 10*3/uL (ref 0.0–0.7)
Lymphocytes Relative: 23.4 % (ref 12.0–46.0)
MCHC: 33.5 g/dL (ref 30.0–36.0)
MCV: 91.9 fl (ref 78.0–100.0)
Monocytes Absolute: 0.6 10*3/uL (ref 0.1–1.0)
Neutrophils Relative %: 61.9 % (ref 43.0–77.0)
RDW: 13.5 % (ref 11.5–14.6)

## 2011-10-26 LAB — PSA: PSA: 3.15 ng/mL (ref 0.10–4.00)

## 2011-10-27 ENCOUNTER — Telehealth: Payer: Self-pay | Admitting: Internal Medicine

## 2011-10-27 ENCOUNTER — Encounter: Payer: Self-pay | Admitting: Internal Medicine

## 2011-10-27 NOTE — Telephone Encounter (Signed)
Stacey , please, inform the patient: labs are OK  Thank you !   

## 2011-10-28 NOTE — Telephone Encounter (Signed)
Pt informed

## 2011-12-31 ENCOUNTER — Other Ambulatory Visit (INDEPENDENT_AMBULATORY_CARE_PROVIDER_SITE_OTHER): Payer: BC Managed Care – PPO

## 2011-12-31 ENCOUNTER — Ambulatory Visit (INDEPENDENT_AMBULATORY_CARE_PROVIDER_SITE_OTHER): Payer: BC Managed Care – PPO | Admitting: Internal Medicine

## 2011-12-31 ENCOUNTER — Encounter: Payer: Self-pay | Admitting: Internal Medicine

## 2011-12-31 VITALS — BP 98/62 | HR 80 | Temp 97.8°F | Resp 16 | Wt 155.0 lb

## 2011-12-31 DIAGNOSIS — Z0000 Encounter for general adult medical examination without abnormal findings: Secondary | ICD-10-CM

## 2011-12-31 DIAGNOSIS — E538 Deficiency of other specified B group vitamins: Secondary | ICD-10-CM

## 2011-12-31 DIAGNOSIS — N342 Other urethritis: Secondary | ICD-10-CM

## 2011-12-31 DIAGNOSIS — R300 Dysuria: Secondary | ICD-10-CM

## 2011-12-31 LAB — URINALYSIS
Leukocytes, UA: NEGATIVE
Specific Gravity, Urine: 1.01 (ref 1.000–1.030)
Urine Glucose: NEGATIVE
Urobilinogen, UA: 0.2 (ref 0.0–1.0)
pH: 6 (ref 5.0–8.0)

## 2011-12-31 MED ORDER — CIPROFLOXACIN HCL 500 MG PO TABS: 500.0000 mg | ORAL_TABLET | Freq: Two times a day (BID) | ORAL | Status: AC

## 2011-12-31 NOTE — Progress Notes (Signed)
  Subjective:    Patient ID: Jorge Turner, male    DOB: [birth year], 56 y.o.   MRN: [id number]  HPI  C/o urinary frequency, pain, urgency x 3 d No d/c  Review of Systems  Constitutional: Positive for chills. Negative for fever and fatigue.  Cardiovascular: Negative for leg swelling.  Genitourinary: Positive for dysuria and frequency. Negative for flank pain and difficulty urinating.  Musculoskeletal: Negative for back pain.  Neurological: Negative for weakness.       Objective:   Physical Exam  Constitutional: He appears well-developed and well-nourished. No distress.  Abdominal: He exhibits no distension. There is no tenderness. There is no guarding.  Genitourinary:       declined  Skin: No rash noted.  Psychiatric: Judgment normal.          Assessment & Plan:

## 2011-12-31 NOTE — Assessment & Plan Note (Addendum)
1/13 - likely prostatitis AZO Cipro x 2 wks

## 2012-01-02 ENCOUNTER — Encounter: Payer: Self-pay | Admitting: Internal Medicine

## 2012-01-02 NOTE — Assessment & Plan Note (Signed)
Continue with current prescription therapy as reflected on the Med list.  

## 2012-01-02 NOTE — Assessment & Plan Note (Signed)
Start Cipro 

## 2012-04-25 ENCOUNTER — Ambulatory Visit: Payer: BC Managed Care – PPO | Admitting: Internal Medicine

## 2012-05-02 ENCOUNTER — Ambulatory Visit: Payer: BC Managed Care – PPO | Admitting: Internal Medicine

## 2012-05-02 DIAGNOSIS — Z0289 Encounter for other administrative examinations: Secondary | ICD-10-CM

## 2012-06-14 ENCOUNTER — Telehealth: Payer: Self-pay | Admitting: Internal Medicine

## 2012-06-14 NOTE — Telephone Encounter (Signed)
Noted. Thx.

## 2012-06-14 NOTE — Telephone Encounter (Signed)
Caller: Jorge Turner; PCP: Plotnikov, Alex; CB#: [phone]; Call regarding left side of neck, glands, tongue and jaw are sore.  No known trauma.  Denies any respiratory distress.  RN override to have pt. seen in 24 hours.  Scheduled for 215 on Thursday.  Triaged per Neck Lump.

## 2012-06-15 ENCOUNTER — Encounter: Payer: Self-pay | Admitting: Internal Medicine

## 2012-06-15 ENCOUNTER — Ambulatory Visit (INDEPENDENT_AMBULATORY_CARE_PROVIDER_SITE_OTHER): Payer: BC Managed Care – PPO | Admitting: Internal Medicine

## 2012-06-15 ENCOUNTER — Other Ambulatory Visit: Payer: Self-pay | Admitting: Internal Medicine

## 2012-06-15 VITALS — BP 104/64 | HR 62 | Temp 97.1°F | Ht 70.0 in | Wt 151.5 lb

## 2012-06-15 DIAGNOSIS — K047 Periapical abscess without sinus: Secondary | ICD-10-CM | POA: Insufficient documentation

## 2012-06-15 DIAGNOSIS — K044 Acute apical periodontitis of pulpal origin: Secondary | ICD-10-CM

## 2012-06-15 MED ORDER — AMOXICILLIN 500 MG PO CAPS: ORAL_CAPSULE | ORAL | Status: DC

## 2012-06-15 NOTE — Patient Instructions (Addendum)
Take all new medications as prescribed Continue all other medications as before  

## 2012-06-17 ENCOUNTER — Encounter: Payer: Self-pay | Admitting: Internal Medicine

## 2012-06-17 NOTE — Progress Notes (Signed)
  Subjective:    Patient ID: Jorge Turner, male    DOB: 1956-07-04, 56 y.o.   MRN: [id number]  HPI  Here with 2-3 days onset left gum pain near the last lower post tooth, now with tender knots to the neck on left, without fever, drainage, sT, cough and Pt denies chest pain, increased sob or doe, wheezing, orthopnea, PND, increased LE swelling, palpitations, dizziness or syncope. Past Medical History  Diagnosis Date  . HERPES ZOSTER 10/19/2010  . VITAMIN B12 DEFICIENCY 06/03/2009  . HEMORRHOIDS 04/23/2008  . GERD 04/30/2008  . RECTAL BLEEDING 01/27/2010  . HEMATOCHEZIA 01/23/2010  . BENIGN PROSTATIC HYPERTROPHY 01/23/2010  . HYDROCELE, RIGHT 03/03/2010  . Unspecified disorder of male genital organs 01/27/2010  . ECZEMA 03/23/2010  . LOW BACK PAIN 07/29/2008  . CHEST PAIN 04/25/2009  . Personal history of colonic polyps 03/03/2010  . COLD SORE 07/29/2008  . DYSPLASTIC NEVUS, CHEST 03/23/2010   Past Surgical History  Procedure Date  . Thumb surgery     reports that he has never smoked. He does not have any smokeless tobacco history on file. He reports that he does not drink alcohol or use illicit drugs. family history includes Diabetes in his father and mother and Hypertension in his father and mother. Allergies  Allergen Reactions  . Doxycycline     nausea  . Erythromycin Ethylsuccinate    Current Outpatient Prescriptions on File Prior to Visit  Medication Sig Dispense Refill  . cholecalciferol (VITAMIN D) 1000 UNITS tablet Take 1,000 Units by mouth daily.        Marland Kitchen ibuprofen (ADVIL,MOTRIN) 600 MG tablet Take 600 mg by mouth as needed.        Marland Kitchen ketoconazole (NIZORAL) 200 MG tablet Take 1 tablet (200 mg total) by mouth daily.  14 tablet  0  . Omega-3 Fatty Acids (FISH OIL) 1200 MG CAPS Take 1 capsule by mouth daily.        . polyethylene glycol (MIRALAX / GLYCOLAX) packet Take 17 g by mouth daily.        . traMADol (ULTRAM) 50 MG tablet Take 50-100 mg by mouth 2 (two) times daily.        Marland Kitchen  triamcinolone (KENALOG) 0.5 % cream Apply 1 application topically 2 (two) times daily.        Marland Kitchen omeprazole (PRILOSEC) 40 MG capsule TAKE 1 CAPSULE BY MOUTH ONCE DAILY  30 capsule  3   Review of Systems All otherwise neg per pt    Objective:   Physical Exam BP 104/64  Pulse 62  Temp 97.1 F (36.2 C) (Oral)  Ht 5\' 10"  (1.778 m)  Wt 151 lb 8 oz (68.72 kg)  BMI 21.74 kg/m2  SpO2 97% Physical Exam  VS noted Constitutional: Pt appears well-developed and well-nourished.  HENT: Head: Normocephalic.  Right Ear: External ear normal.  Left Ear: External ear normal.  Eyes: Conjunctivae and EOM are normal. Pupils are equal, round, and reactive to light.  Mouth:  Left gumline near lower molar mild red, tender, swelling without flucutance or drainage Neck: Normal range of motion. Neck supple. several small tender mobile left submandibular LA noted Cardiovascular: Normal rate and regular rhythm.   Pulmonary/Chest: Effort normal and breath sounds normal.  Neurological: Pt is alert.     Assessment & Plan:

## 2012-06-17 NOTE — Assessment & Plan Note (Signed)
Mild to mod, for antibx course,  to f/u any worsening symptoms or concerns 

## 2012-09-22 ENCOUNTER — Other Ambulatory Visit: Payer: Self-pay | Admitting: Internal Medicine

## 2012-10-24 ENCOUNTER — Encounter: Payer: Self-pay | Admitting: Internal Medicine

## 2012-10-24 ENCOUNTER — Ambulatory Visit (INDEPENDENT_AMBULATORY_CARE_PROVIDER_SITE_OTHER): Payer: BC Managed Care – PPO | Admitting: Internal Medicine

## 2012-10-24 VITALS — BP 100/72 | HR 60 | Temp 98.2°F | Resp 16 | Wt 155.0 lb

## 2012-10-24 DIAGNOSIS — M25561 Pain in right knee: Secondary | ICD-10-CM

## 2012-10-24 DIAGNOSIS — M25569 Pain in unspecified knee: Secondary | ICD-10-CM

## 2012-10-24 MED ORDER — IBUPROFEN 600 MG PO TABS: 600.0000 mg | ORAL_TABLET | Freq: Two times a day (BID) | ORAL | Status: DC

## 2012-10-24 NOTE — Assessment & Plan Note (Signed)
11/13 acute - lateral/posterior ACE Ibuprofen

## 2012-10-24 NOTE — Progress Notes (Signed)
Patient ID: Jorge Turner, male   DOB: [birth year], 56 y.o.   MRN: [id number]  Subjective:    Patient ID: Jorge Turner, male    DOB: 10-24-1956, 56 y.o.   MRN: [id number]  Knee Pain  The incident occurred 3 to 5 days ago. The incident occurred at work. The injury mechanism is unknown (stepped wrong). The pain is present in the right knee. The quality of the pain is described as aching. The pain is moderate. The pain has been intermittent since onset. He reports no foreign bodies present. Exacerbated by: sliding when bowling. He has tried ice for the symptoms. The treatment provided mild relief.      Review of Systems  Constitutional: Negative for appetite change, fatigue and unexpected weight change.  HENT: Negative for nosebleeds, congestion, sore throat, sneezing, trouble swallowing and neck pain.   Eyes: Negative for itching and visual disturbance.  Respiratory: Negative for cough.   Cardiovascular: Negative for chest pain, palpitations and leg swelling.  Gastrointestinal: Negative for nausea, diarrhea, blood in stool and abdominal distention.  Genitourinary: Positive for urgency and difficulty urinating (rare). Negative for frequency, hematuria, decreased urine volume and scrotal swelling.  Musculoskeletal: Negative for back pain, joint swelling and gait problem.  Skin: Negative for rash.  Neurological: Negative for dizziness, tremors, speech difficulty and weakness.  Psychiatric/Behavioral: Negative for suicidal ideas, sleep disturbance, dysphoric mood and agitation. The patient is not nervous/anxious.        Objective:   Physical Exam  Constitutional: He is oriented to person, place, and time. He appears well-developed. No distress.  HENT:  Head: Normocephalic and atraumatic.  Right Ear: External ear normal.  Left Ear: External ear normal.  Nose: Nose normal.  Mouth/Throat: Oropharynx is clear and moist. No oropharyngeal exudate.  Eyes: Conjunctivae normal and EOM are normal. Pupils  are equal, round, and reactive to light. Right eye exhibits no discharge. Left eye exhibits no discharge. No scleral icterus.  Neck: Normal range of motion. Neck supple. No JVD present. No tracheal deviation present. No thyromegaly present.  Cardiovascular: Normal rate, regular rhythm, normal heart sounds and intact distal pulses.  Exam reveals no gallop and no friction rub.   No murmur heard. Pulmonary/Chest: Effort normal and breath sounds normal. No stridor. No respiratory distress. He has no wheezes. He has no rales. He exhibits no tenderness.  Abdominal: Soft. Bowel sounds are normal. He exhibits no distension and no mass. There is no tenderness. There is no rebound and no guarding.  Genitourinary: Rectum normal and penis normal. Guaiac negative stool. No penile tenderness.       Prostate 1+  Musculoskeletal: Normal range of motion. He exhibits no edema and no tenderness.  Lymphadenopathy:    He has no cervical adenopathy.  Neurological: He is alert and oriented to person, place, and time. He has normal reflexes. No cranial nerve deficit. He exhibits normal muscle tone. Coordination normal.  Skin: Skin is warm and dry. Rash (hypopigmented maculas) noted. He is not diaphoretic. No erythema. No pallor.  Psychiatric: He has a normal mood and affect. His behavior is normal. Judgment and thought content normal.  R posterior knee fullness and tenderness is present       Assessment & Plan:

## 2012-12-06 ENCOUNTER — Ambulatory Visit (INDEPENDENT_AMBULATORY_CARE_PROVIDER_SITE_OTHER): Payer: BC Managed Care – PPO | Admitting: Internal Medicine

## 2012-12-06 ENCOUNTER — Encounter: Payer: Self-pay | Admitting: Internal Medicine

## 2012-12-06 ENCOUNTER — Telehealth: Payer: Self-pay | Admitting: Internal Medicine

## 2012-12-06 VITALS — BP 108/72 | HR 67 | Temp 98.0°F | Ht 70.0 in | Wt 155.5 lb

## 2012-12-06 DIAGNOSIS — J019 Acute sinusitis, unspecified: Secondary | ICD-10-CM

## 2012-12-06 MED ORDER — AMOXICILLIN-POT CLAVULANATE 875-125 MG PO TABS: 1.0000 | ORAL_TABLET | Freq: Two times a day (BID) | ORAL | Status: DC

## 2012-12-06 NOTE — Telephone Encounter (Signed)
Patient Information:  Caller Name: Spenser  Phone: [phone]  Patient: Jorge, Turner  Gender: Male  DOB: 28-Oct-1956  Age: 57 Years  PCP: Plotnikov, Alex (Adults only)  Office Follow Up:  Does the office need to follow up with this patient?: No  Instructions For The Office: N/A   Symptoms  Reason For Call & Symptoms: headache, cough  Reviewed Health History In EMR: Yes  Reviewed Medications In EMR: Yes  Reviewed Allergies In EMR: Yes  Reviewed Surgeries / Procedures: Yes  Date of Onset of Symptoms: 12/02/2012  Treatments Tried: mucinex  Treatments Tried Worked: No  Guideline(s) Used:  Sinus Pain and Congestion  Disposition Per Guideline:   See Today or Tomorrow in Office  Reason For Disposition Reached:   Patient wants to be seen  Advice Given:  N/A  Appointment Scheduled:  12/06/2012 11:15:00 Appointment Scheduled Provider:  Nicki Reaper  RN offered patient appt with Dr Posey Rea at 1600 or 1615 but pt could not make that appt time.  Pt requested earlier appt.

## 2012-12-06 NOTE — Progress Notes (Signed)
HPI  Pt presents to the clinic today with c/o fever, nasal congestion, sinus pain and pressure and headache. This started 4 days ago. He tried OTC Mucinex without relief. The symptoms seem to get worse every day. He has no history of asthma or allergies. He has had sick contacts.  Review of Systems    Past Medical History  Diagnosis Date  . HERPES ZOSTER 10/19/2010  . VITAMIN B12 DEFICIENCY 06/03/2009  . HEMORRHOIDS 04/23/2008  . GERD 04/30/2008  . RECTAL BLEEDING 01/27/2010  . HEMATOCHEZIA 01/23/2010  . BENIGN PROSTATIC HYPERTROPHY 01/23/2010  . HYDROCELE, RIGHT 03/03/2010  . Unspecified disorder of male genital organs 01/27/2010  . ECZEMA 03/23/2010  . LOW BACK PAIN 07/29/2008  . CHEST PAIN 04/25/2009  . Personal history of colonic polyps 03/03/2010  . COLD SORE 07/29/2008  . DYSPLASTIC NEVUS, CHEST 03/23/2010    Family History  Problem Relation Age of Onset  . Hypertension Mother   . Hypertension Father   . Diabetes Mother   . Diabetes Father     History   Social History  . Marital Status: Divorced    Spouse Name: N/A    Number of Children: N/A  . Years of Education: N/A   Occupational History  . Radio producer    Social History Main Topics  . Smoking status: Never Smoker   . Smokeless tobacco: Not on file  . Alcohol Use: No  . Drug Use: No  . Sexually Active: Yes   Other Topics Concern  . Not on file   Social History Narrative   He was in the army; jumped off the plane many timesRegular exercise-yes bowlingDaily Caffeine Use-Tea    Allergies  Allergen Reactions  . Doxycycline     nausea  . Erythromycin Ethylsuccinate      Constitutional: Positive headache, fatigue and fever. Denies abrupt weight changes.  HEENT:  Positive eye pain, pressure behind the eyes, facial pain, nasal congestion and sore throat. Denies eye redness, ear pain, ringing in the ears, wax buildup, runny nose or bloody nose. Respiratory: Positive cough and thick green sputum production.  Denies difficulty breathing or shortness of breath.  Cardiovascular: Denies chest pain, chest tightness, palpitations or swelling in the hands or feet.   No other specific complaints in a complete review of systems (except as listed in HPI above).  Objective:    BP 108/72  Pulse 67  Temp 98 F (36.7 C) (Oral)  Ht 5\' 10"  (1.778 m)  Wt 155 lb 8 oz (70.534 kg)  BMI 22.31 kg/m2  SpO2 97% Wt Readings from Last 3 Encounters:  12/06/12 155 lb 8 oz (70.534 kg)  10/24/12 155 lb (70.308 kg)  06/15/12 151 lb 8 oz (68.72 kg)    General: Appears his stated age, well developed, well nourished in NAD. HEENT: Head: normal shape and size; Eyes: sclera white, no icterus, conjunctiva pink, PERRLA and EOMs intact; Ears: Tm's gray and intact, normal light reflex; Nose: mucosa pink and moist, septum midline; Throat/Mouth: + PND. Teeth present, mucosa pink and moist, no exudate noted, no lesions or ulcerations noted.  Neck: Mild cervical lymphadenopathy. Neck supple, trachea midline. No massses, lumps or thyromegaly present.  Cardiovascular: Normal rate and rhythm. S1,S2 noted.  No murmur, rubs or gallops noted. No JVD or BLE edema. No carotid bruits noted. Pulmonary/Chest: Normal effort and positive vesicular breath sounds. No respiratory distress. No wheezes, rales or ronchi noted.      Assessment & Plan:   Acute bacterial sinusitis  Can use a Neti Pot which can be purchased from your local drug store. Flonase 2 sprays each nostril for 3 days and then as needed. Augmentin BID for 10 days  RTC as needed or if symptoms persist.

## 2012-12-06 NOTE — Patient Instructions (Signed)

## 2013-02-09 ENCOUNTER — Ambulatory Visit (INDEPENDENT_AMBULATORY_CARE_PROVIDER_SITE_OTHER): Payer: BC Managed Care – PPO | Admitting: Internal Medicine

## 2013-02-09 ENCOUNTER — Encounter: Payer: Self-pay | Admitting: Internal Medicine

## 2013-02-09 VITALS — BP 100/80 | HR 72 | Temp 98.1°F | Resp 16 | Wt 155.0 lb

## 2013-02-09 DIAGNOSIS — K219 Gastro-esophageal reflux disease without esophagitis: Secondary | ICD-10-CM

## 2013-02-09 DIAGNOSIS — R05 Cough: Secondary | ICD-10-CM

## 2013-02-09 DIAGNOSIS — E538 Deficiency of other specified B group vitamins: Secondary | ICD-10-CM

## 2013-02-09 DIAGNOSIS — M25569 Pain in unspecified knee: Secondary | ICD-10-CM

## 2013-02-09 DIAGNOSIS — R059 Cough, unspecified: Secondary | ICD-10-CM | POA: Insufficient documentation

## 2013-02-09 DIAGNOSIS — M25562 Pain in left knee: Secondary | ICD-10-CM

## 2013-02-09 DIAGNOSIS — K625 Hemorrhage of anus and rectum: Secondary | ICD-10-CM

## 2013-02-09 DIAGNOSIS — K649 Unspecified hemorrhoids: Secondary | ICD-10-CM

## 2013-02-09 DIAGNOSIS — M25561 Pain in right knee: Secondary | ICD-10-CM | POA: Insufficient documentation

## 2013-02-09 MED ORDER — VITAMIN B-12 1000 MCG SL SUBL: 1.0000 | SUBLINGUAL_TABLET | Freq: Every day | SUBLINGUAL | Status: AC

## 2013-02-09 MED ORDER — HYDROCORTISONE 2.5 % RE CREA: TOPICAL_CREAM | RECTAL | Status: DC

## 2013-02-09 MED ORDER — TRAMADOL HCL 50 MG PO TABS: 50.0000 mg | ORAL_TABLET | Freq: Two times a day (BID) | ORAL | Status: DC

## 2013-02-09 MED ORDER — IBUPROFEN 600 MG PO TABS: 600.0000 mg | ORAL_TABLET | Freq: Two times a day (BID) | ORAL | Status: DC

## 2013-02-09 MED ORDER — OMEPRAZOLE 40 MG PO CPDR: 40.0000 mg | DELAYED_RELEASE_CAPSULE | Freq: Every day | ORAL | Status: DC

## 2013-02-09 NOTE — Assessment & Plan Note (Signed)
Anusol HC prn 

## 2013-02-09 NOTE — Assessment & Plan Note (Signed)
Continue with current  therapy as reflected on the Med list.  

## 2013-02-09 NOTE — Assessment & Plan Note (Signed)
anusol [facility] Rx

## 2013-02-09 NOTE — Assessment & Plan Note (Signed)
Continue with current prescription therapy as reflected on the Med list.  

## 2013-02-09 NOTE — Progress Notes (Signed)
   Subjective:    HPI  C/o R>L knee pain worse w/walking up the steps x 3-4 wks. No pain w/walking, bowling C/o occ hemorrhoids flare up   Review of Systems  Constitutional: Negative for appetite change, fatigue and unexpected weight change.  HENT: Negative for nosebleeds, congestion, sore throat, sneezing, trouble swallowing and neck pain.   Eyes: Negative for itching and visual disturbance.  Respiratory: Negative for cough.   Cardiovascular: Negative for chest pain, palpitations and leg swelling.  Gastrointestinal: Negative for nausea, diarrhea, blood in stool and abdominal distention.  Genitourinary: Positive for urgency and difficulty urinating (rare). Negative for frequency, hematuria, decreased urine volume and scrotal swelling.  Musculoskeletal: Negative for back pain, joint swelling and gait problem.  Skin: Negative for rash.  Neurological: Negative for dizziness, tremors, speech difficulty and weakness.  Psychiatric/Behavioral: Negative for suicidal ideas, sleep disturbance, dysphoric mood and agitation. The patient is not nervous/anxious.        Objective:   Physical Exam  Constitutional: He is oriented to person, place, and time. He appears well-developed. No distress.  HENT:  Head: Normocephalic and atraumatic.  Right Ear: External ear normal.  Left Ear: External ear normal.  Nose: Nose normal.  Mouth/Throat: Oropharynx is clear and moist. No oropharyngeal exudate.  Eyes: Conjunctivae and EOM are normal. Pupils are equal, round, and reactive to light. Right eye exhibits no discharge. Left eye exhibits no discharge. No scleral icterus.  Neck: Normal range of motion. Neck supple. No JVD present. No tracheal deviation present. No thyromegaly present.  Cardiovascular: Normal rate, regular rhythm, normal heart sounds and intact distal pulses.  Exam reveals no gallop and no friction rub.   No murmur heard. Pulmonary/Chest: Effort normal and breath sounds normal. No  stridor. No respiratory distress. He has no wheezes. He has no rales. He exhibits no tenderness.  Abdominal: Soft. Bowel sounds are normal. He exhibits no distension and no mass. There is no tenderness. There is no rebound and no guarding.  Genitourinary: Rectum normal and penis normal. Guaiac negative stool. No penile tenderness.  Prostate 1+  Musculoskeletal: Normal range of motion. He exhibits no edema and no tenderness.  Lymphadenopathy:    He has no cervical adenopathy.  Neurological: He is alert and oriented to person, place, and time. He has normal reflexes. No cranial nerve deficit. He exhibits normal muscle tone. Coordination normal.  Skin: Skin is warm and dry. Rash (hypopigmented maculas) noted. He is not diaphoretic. No erythema. No pallor.  Psychiatric: He has a normal mood and affect. His behavior is normal. Judgment and thought content normal.  R posterior knee fullness and tenderness is present       Assessment & Plan:

## 2013-02-09 NOTE — Assessment & Plan Note (Addendum)
3/14 post URI Tramadol prn

## 2013-02-09 NOTE — Assessment & Plan Note (Addendum)
worse w/walking up the steps Ibuprofen/Tramadol prn ?patella chondromalacia vs other Sports Med consult

## 2013-02-21 ENCOUNTER — Ambulatory Visit (INDEPENDENT_AMBULATORY_CARE_PROVIDER_SITE_OTHER): Payer: BC Managed Care – PPO | Admitting: Sports Medicine

## 2013-02-21 VITALS — BP 118/80 | Ht 70.0 in | Wt 162.0 lb

## 2013-02-21 DIAGNOSIS — M25569 Pain in unspecified knee: Secondary | ICD-10-CM

## 2013-02-21 NOTE — Progress Notes (Signed)
Subjective: Jorge Turner is a 57-y.o. new patient who presents to clinic c/o bilateral knee pain and right shoulder pain.  His bilateral knee pain has started gradually over the past year with no apparent trauma or injury related to onset.  Pain is localized to the proximal aspect of the knee over the quadriceps tendon; he has no pain within the knee joint itself.  He also notes some pain on the posterior aspect of the right knee over the medial hamstring tendon.  Describes pain as aching, non-radiating, and intermittent.  It occurs mainly when he is walking up stairs.  He is an avid bowler, and denies any pain with bowling, crouching, or squatting.  He typically bowls 3 to 4 times per week.  No recent changes in the frequency or intensity at which he has been bowling.  His pain is alleviated with rest and Ibuprofen.  Denies swelling, paresthesias, or weakness.  No previous knee injuries.  His right shoulder pain began approximately 2 weeks ago.  Denies any trauma or injury related to onset.  No previous shoulder injuries.  He is right-handed.  Pain is localized to the posterior aspect of his shoulder over the trapezius muscle.  Described as "throbbing" but non-radiating.  He and his family just returned from a road trip to [place], and he states that sitting in the car for that extended time really aggravated the pain.  Denies paresthesias of the upper right extremity.  Some pain relief with Ibuprofen.  PMHx of hemorrhoids and esophageal reflux.  No surgical hx.  Current medications include Vitamin B12 and Motrin.  He is allergic to Erythromycin and Doxycycline.  Denies tobacco or alcohol use.  He works as an Ambulance person.  FHx is postive for diabetes [father], heart disease [father], and high blood pressure [father, mother, sisters].  Review of Systems: Negative except as noted above.  Objective: GENERAL - Well-developed, well-nourished. NAD. RIGHT SHOULDER - No apparent  deformity on inspection.  Patient sits with a slouched posture.  No swelling or bruising.  Mild TTP to posterior aspect of shoulder over the trapezius muscle.  Full range of motion of shoulder with no pain.  Good shoulder strength with normal muscle tone.  Sensation intact.  Upper extremity DTRs intact B/L.  Right upper extremity is neurovascularly intact. KNEES - No effusion or bruising.  No palpable defect over quadriceps or hamstring  tendons.  Mild TTP to distal quadriceps tendon B/L and to medial hamstring tendon on right leg.  Pain with resisted flexion of right knee.  Full range of motion.  Good quadriceps and hamstring strength with normal muscle tone.  Sensation intact.  Lower extremity DTRs intact B/L.  Lower extremities are neurovascularly intact.  Assessment/Plan:  1. Right shoulder pain secondary to trapezius muscle strain/spasm - Directed to apply moist heat to the area of pain.  He may continue taking Ibuprofen if it gives him relief and does not aggravate his stomach.  He should focus on sitting up straight and improving his posture.  His symptoms should resolve in the next 1 to 2 weeks.  2. Bilateral knee pain secondary to quadriceps and hamstring tendonitis - He was given a home exercise program to increase strength of his quadriceps and hamstrings.  He may continue to bowl and do other activities as limited by pain.  He will return to clinic in 6 weeks for follow-up.  If his symptoms have not improved or have worsened, we may consider an ultrasound of  his quadriceps and hamstring tendons.  Dictated by Lonzo Candy, MS4.

## 2013-03-07 ENCOUNTER — Other Ambulatory Visit: Payer: Self-pay | Admitting: *Deleted

## 2013-03-07 ENCOUNTER — Other Ambulatory Visit: Payer: Self-pay | Admitting: Internal Medicine

## 2013-03-07 DIAGNOSIS — Z0000 Encounter for general adult medical examination without abnormal findings: Secondary | ICD-10-CM

## 2013-03-07 DIAGNOSIS — Z0389 Encounter for observation for other suspected diseases and conditions ruled out: Secondary | ICD-10-CM

## 2013-03-07 MED ORDER — OMEPRAZOLE 40 MG PO CPDR: 40.0000 mg | DELAYED_RELEASE_CAPSULE | Freq: Every day | ORAL | Status: DC

## 2013-03-07 MED ORDER — IBUPROFEN 600 MG PO TABS: 600.0000 mg | ORAL_TABLET | Freq: Two times a day (BID) | ORAL | Status: DC

## 2013-04-02 ENCOUNTER — Ambulatory Visit (INDEPENDENT_AMBULATORY_CARE_PROVIDER_SITE_OTHER): Payer: BC Managed Care – PPO | Admitting: Sports Medicine

## 2013-04-02 VITALS — BP 120/73 | Ht 70.0 in | Wt 156.0 lb

## 2013-04-02 DIAGNOSIS — M6580 Other synovitis and tenosynovitis, unspecified site: Secondary | ICD-10-CM

## 2013-04-02 DIAGNOSIS — M76899 Other specified enthesopathies of unspecified lower limb, excluding foot: Secondary | ICD-10-CM

## 2013-04-03 NOTE — Progress Notes (Signed)
  Subjective:    Patient ID: Jorge Turner, male    DOB: [birth year], 57 y.o.   MRN: [id number]  HPI Patient comes in today for followup on both bilateral knee pain and right shoulder pain. Symptoms have improved in all areas. Still some mild discomfort in his quadriceps tendons with activity but he is able to bowl fairly comfortably. He has been doing his home exercises but admits that he has not been consistent with them. He is without complaint today.    Review of Systems     Objective:   Physical Exam Well-developed, well-nourished. No acute distress.  Right shoulder: The tenderness to palpation which was present over the right trapezius at his last visit has now resolved. There is no spasm. He has full cervical range of motion. Bilateral knees: Full range of motion without effusion. Still some slight crepitus over the distal quad tendon but it is nonpainful to palpation. No soft tissue swelling. Neurovascularly intact distally. Walking without a limp.       Assessment & Plan:  1. Resolved right shoulder pain secondary to trapezius muscle strain 2. Improving bilateral knee pain secondary to quadriceps tendinitis  I've asked the patient to be a little more diligent in his home exercises regarding his knees. Otherwise he can continue with activity as tolerated without restriction and followup when necessary.

## 2013-04-16 ENCOUNTER — Encounter: Payer: BC Managed Care – PPO | Admitting: Internal Medicine

## 2013-05-14 ENCOUNTER — Encounter: Payer: BC Managed Care – PPO | Admitting: Internal Medicine

## 2013-06-11 ENCOUNTER — Encounter: Payer: Self-pay | Admitting: Internal Medicine

## 2013-06-11 ENCOUNTER — Other Ambulatory Visit (INDEPENDENT_AMBULATORY_CARE_PROVIDER_SITE_OTHER): Payer: BC Managed Care – PPO

## 2013-06-11 ENCOUNTER — Ambulatory Visit (INDEPENDENT_AMBULATORY_CARE_PROVIDER_SITE_OTHER): Payer: BC Managed Care – PPO | Admitting: Internal Medicine

## 2013-06-11 VITALS — BP 100/76 | HR 76 | Temp 98.0°F | Resp 16 | Ht 70.0 in | Wt 147.0 lb

## 2013-06-11 DIAGNOSIS — N400 Enlarged prostate without lower urinary tract symptoms: Secondary | ICD-10-CM

## 2013-06-11 DIAGNOSIS — R972 Elevated prostate specific antigen [PSA]: Secondary | ICD-10-CM

## 2013-06-11 DIAGNOSIS — Z87898 Personal history of other specified conditions: Secondary | ICD-10-CM

## 2013-06-11 DIAGNOSIS — Z23 Encounter for immunization: Secondary | ICD-10-CM

## 2013-06-11 DIAGNOSIS — Z0389 Encounter for observation for other suspected diseases and conditions ruled out: Secondary | ICD-10-CM

## 2013-06-11 DIAGNOSIS — Z0000 Encounter for general adult medical examination without abnormal findings: Secondary | ICD-10-CM

## 2013-06-11 DIAGNOSIS — E538 Deficiency of other specified B group vitamins: Secondary | ICD-10-CM

## 2013-06-11 DIAGNOSIS — Z87438 Personal history of other diseases of male genital organs: Secondary | ICD-10-CM | POA: Insufficient documentation

## 2013-06-11 LAB — CBC WITH DIFFERENTIAL/PLATELET
Basophils Absolute: 0 10*3/uL (ref 0.0–0.1)
Eosinophils Absolute: 0 10*3/uL (ref 0.0–0.7)
HCT: 41.7 % (ref 39.0–52.0)
Lymphs Abs: 1 10*3/uL (ref 0.7–4.0)
MCHC: 33.2 g/dL (ref 30.0–36.0)
MCV: 92.2 fl (ref 78.0–100.0)
Monocytes Absolute: 0.4 10*3/uL (ref 0.1–1.0)
Neutrophils Relative %: 49.9 % (ref 43.0–77.0)
Platelets: 240 10*3/uL (ref 150.0–400.0)
RDW: 13.6 % (ref 11.5–14.6)
WBC: 3 10*3/uL — ABNORMAL LOW (ref 4.5–10.5)

## 2013-06-11 LAB — LIPID PANEL
Cholesterol: 179 mg/dL (ref 0–200)
LDL Cholesterol: 112 mg/dL — ABNORMAL HIGH (ref 0–99)
Total CHOL/HDL Ratio: 3
VLDL: 7.8 mg/dL (ref 0.0–40.0)

## 2013-06-11 LAB — URINALYSIS, ROUTINE W REFLEX MICROSCOPIC
Bilirubin Urine: NEGATIVE
Nitrite: NEGATIVE
Total Protein, Urine: NEGATIVE
Urobilinogen, UA: 0.2 (ref 0.0–1.0)

## 2013-06-11 LAB — PSA: PSA: 10.02 ng/mL — ABNORMAL HIGH (ref 0.10–4.00)

## 2013-06-11 LAB — COMPREHENSIVE METABOLIC PANEL
ALT: 15 U/L (ref 0–53)
AST: 16 U/L (ref 0–37)
Albumin: 3.8 g/dL (ref 3.5–5.2)
Alkaline Phosphatase: 63 U/L (ref 39–117)
Glucose, Bld: 94 mg/dL (ref 70–99)
Potassium: 4.3 mEq/L (ref 3.5–5.1)
Sodium: 141 mEq/L (ref 135–145)
Total Bilirubin: 0.8 mg/dL (ref 0.3–1.2)
Total Protein: 6.9 g/dL (ref 6.0–8.3)

## 2013-06-11 MED ORDER — TRAMADOL HCL 50 MG PO TABS: 50.0000 mg | ORAL_TABLET | Freq: Two times a day (BID) | ORAL | Status: DC

## 2013-06-11 MED ORDER — CIPROFLOXACIN HCL 500 MG PO TABS: 500.0000 mg | ORAL_TABLET | Freq: Two times a day (BID) | ORAL | Status: DC

## 2013-06-11 MED ORDER — KETOCONAZOLE 200 MG PO TABS: 200.0000 mg | ORAL_TABLET | Freq: Every day | ORAL | Status: DC

## 2013-06-11 MED ORDER — KETOCONAZOLE 2 % EX CREA: TOPICAL_CREAM | Freq: Every day | CUTANEOUS | Status: DC

## 2013-06-11 NOTE — Assessment & Plan Note (Signed)
Continue with current prescription therapy as reflected on the Med list.  

## 2013-06-11 NOTE — Assessment & Plan Note (Signed)
T corporis Ketocon po

## 2013-06-11 NOTE — Assessment & Plan Note (Addendum)
We discussed age appropriate health related issues, including available/recomended screening tests and vaccinations. We discussed a need for adhering to healthy diet and exercise. Labs/EKG were reviewed/ordered. All questions were answered.  tDAP 

## 2013-06-11 NOTE — Assessment & Plan Note (Signed)
urol ref re: poss circumcision

## 2013-06-11 NOTE — Progress Notes (Signed)
   Subjective:    HPI The patient is here for a wellness exam. The patient has been doing well overall without major physical or psychological issues going on lately. C/o L groin pain  x 2 mo    Review of Systems  Constitutional: Negative for appetite change, fatigue and unexpected weight change.  HENT: Negative for nosebleeds, congestion, sore throat, sneezing, trouble swallowing and neck pain.   Eyes: Negative for itching and visual disturbance.  Respiratory: Negative for cough.   Cardiovascular: Negative for chest pain, palpitations and leg swelling.  Gastrointestinal: Negative for nausea, diarrhea, blood in stool and abdominal distention.  Genitourinary: Positive for urgency and difficulty urinating (rare). Negative for frequency, hematuria, decreased urine volume and scrotal swelling.  Musculoskeletal: Negative for back pain, joint swelling and gait problem.  Skin: Negative for rash.  Neurological: Negative for dizziness, tremors, speech difficulty and weakness.  Psychiatric/Behavioral: Negative for suicidal ideas, sleep disturbance, dysphoric mood and agitation. The patient is not nervous/anxious.        Objective:   Physical Exam  Constitutional: He is oriented to person, place, and time. He appears well-developed. No distress.  HENT:  Head: Normocephalic and atraumatic.  Right Ear: External ear normal.  Left Ear: External ear normal.  Nose: Nose normal.  Mouth/Throat: Oropharynx is clear and moist. No oropharyngeal exudate.  Eyes: Conjunctivae and EOM are normal. Pupils are equal, round, and reactive to light. Right eye exhibits no discharge. Left eye exhibits no discharge. No scleral icterus.  Neck: Normal range of motion. Neck supple. No JVD present. No tracheal deviation present. No thyromegaly present.  Cardiovascular: Normal rate, regular rhythm, normal heart sounds and intact distal pulses.  Exam reveals no gallop and no friction rub.   No murmur  heard. Pulmonary/Chest: Effort normal and breath sounds normal. No stridor. No respiratory distress. He has no wheezes. He has no rales. He exhibits no tenderness.  Abdominal: Soft. Bowel sounds are normal. He exhibits no distension and no mass. There is no tenderness. There is no rebound and no guarding.  Genitourinary: Rectum normal and penis normal. Guaiac negative stool. No penile tenderness.  Prostate 1+  Musculoskeletal: Normal range of motion. He exhibits no edema and no tenderness.  Lymphadenopathy:    He has no cervical adenopathy.  Neurological: He is alert and oriented to person, place, and time. He has normal reflexes. No cranial nerve deficit. He exhibits normal muscle tone. Coordination normal.  Skin: Skin is warm and dry. Rash (hypopigmented maculas) noted. He is not diaphoretic. No erythema. No pallor.  Psychiatric: He has a normal mood and affect. His behavior is normal. Judgment and thought content normal.   Groin - sensitive on L , no mass      Assessment & Plan:

## 2013-06-11 NOTE — Assessment & Plan Note (Addendum)
Lab Results  Component Value Date   PSA 10.02* 06/11/2013   PSA 3.15 10/26/2011   PSA 2.60 11/03/2010   Cipro x2 wks PSA and free PSA Urol ref Dr Vernie Ammons

## 2013-06-11 NOTE — Assessment & Plan Note (Addendum)
No new sx's 

## 2013-06-11 NOTE — Patient Instructions (Signed)
Take ketoconazole in 2 wks after you finished Cipro

## 2013-06-28 ENCOUNTER — Other Ambulatory Visit: Payer: Self-pay | Admitting: Urology

## 2013-07-11 ENCOUNTER — Encounter (HOSPITAL_BASED_OUTPATIENT_CLINIC_OR_DEPARTMENT_OTHER): Payer: Self-pay | Admitting: *Deleted

## 2013-07-11 NOTE — Progress Notes (Signed)
NPO AFTER MN. ARRIVES AT 0600. NEED HG. WILL TAKE PRILOSEC AM OF SURG W/ SIP OF WATER.

## 2013-07-15 NOTE — H&P (Signed)
Reason For Visit     Jorge Turner is a 57 year old male who was seen for and elevated PSA and balanitis.   History of Present Illness            Right hydrocele: He had a history of a right hydrocele that over time progressively increased in size to the point where it was quite large and becoming uncomfortable and difficult to manage.  Tx: On 11/13/10 he underwent a right hydrocelectomy.      LUTS: He had voiding symptoms that consisted of urinary frequency, nocturia and some occasional hesitancy but reports his stream is strong for the most part. He does get up about 1-2 times every night. He says he drinks a lot of water and fruit juices during the day. He reports that when he does urinate it is not a small amount but rather a large amount each time. He therefore underwent a trial of alpha blockade therapy with Rapaflo but noted no significant change in his voiding symptoms on this medication. His chief complaint was that of nocturia which resulted in interruption of his sleep.  Interval history: He reports he was recently found to have an elevated PSA. He was told it may be elevated do to a lab error/spurious value and apparently was placed on antibiotics but he did not get that filled. He has no voiding symptoms and specifically denies any dysuria or change in his voiding pattern. He's not seen any hematuria. He denies any bone pain or unexplained weight loss. He has not had a history of UTIs or prostatitis in the past.  He also indicated that he has been having pain in the area of his foreskin when he has an erection. This has been occurring for a number of years. He has no difficulty retracting his foreskin. He also does not report infections beneath the foreskin in the past.  PSAs:  12/11 - 2.6 11/12 - 3.15 7/14 - 10.02   Past Medical History Problems  1. History of  Heartburn With Regurgitation 787.1  Surgical History Problems  1. History of  Neurorrhaphy Right Thumb 2. History of   Surgery Tunica Vaginalis Excision Of Hydrocele Right  Current Meds 1. Acyclovir 800 MG Oral Tablet; Therapy: 21Nov2011 to 2. Ceftin 250 MG Oral Tablet; Therapy: (Recorded:01Dec2011) to 3. ExeFen-IR 60-400 MG Oral Tablet; Therapy: 29Nov2011 to 4. Fish Oil CAPS; Therapy: (Recorded:24Mar2011) to 5. Ibuprofen TABS; Therapy: (Recorded:01Dec2011) to  Allergies Medication  1. Azithromycin TABS 2. Erythromycin Derivatives  Family History Problems  1. Paternal history of  Diabetes Mellitus V18.0 2. Maternal grandfather's history of  Diabetes Mellitus V18.0 3. Paternal grandfather's history of  Diabetes Mellitus V18.0 4. Family history of  Family Health Status Number Of Children 5. Maternal history of  Hypertension V17.49 6. Paternal history of  Hypertension V17.49 7. Sororal history of  Hypertension V17.49 8. Maternal grandmother's history of  Stroke Syndrome V17.1  Social History Problems    Caffeine Use   Marital History - Divorced V61.03  Review of Systems Genitourinary, constitutional, skin, eye, otolaryngeal, hematologic/lymphatic, cardiovascular, pulmonary, endocrine, musculoskeletal, gastrointestinal, neurological and psychiatric system(s) were reviewed and pertinent findings if present are noted.  Genitourinary: nocturia.  Integumentary: pruritus.  ENT: sinus problems.  Musculoskeletal: back pain and joint pain.    Vitals Vital Signs  BMI Calculated: 22.05 BSA Calculated: 1.87 Height: 5 ft 10 in Weight: 154 lb  Blood Pressure: 104 / 75 Heart Rate: 61  Physical Exam Constitutional: Well nourished and well developed . No  acute distress.  ENT:. The ears and nose are normal in appearance.  Neck: The appearance of the neck is normal and no neck mass is present.  Pulmonary: No respiratory distress and normal respiratory rhythm and effort.  Cardiovascular: Heart rate and rhythm are normal . No peripheral edema.  Abdomen: The abdomen is soft and nontender. No masses are  palpated. No CVA tenderness. No hernias are palpable. No hepatosplenomegaly noted.  Rectal: Rectal exam demonstrates normal sphincter tone, no tenderness and no masses. The prostate has no nodularity and is not tender. The left seminal vesicle is nonpalpable. The right seminal vesicle is nonpalpable. The perineum is normal on inspection.  Genitourinary: Examination of the penis demonstrates no discharge, no masses, no lesions and a normal meatus. The penis is uncircumcised. The scrotum is without lesions. The right epididymis is palpably normal and non-tender. The left epididymis is palpably normal and non-tender. The right testis is non-tender and without masses. The left testis is non-tender and without masses.  Lymphatics: The femoral and inguinal nodes are not enlarged or tender.  Skin: Normal skin turgor, no visible rash and no visible skin lesions.  Neuro/Psych:. Mood and affect are appropriate.    Results/Data Urine  COLOR YELLOW  APPEARANCE CLEAR  SPECIFIC GRAVITY 1.015  pH 6.0  GLUCOSE NEG mg/dL BILIRUBIN NEG  KETONE NEG mg/dL BLOOD TRACE  PROTEIN NEG mg/dL UROBILINOGEN 1 mg/dL NITRITE NEG  LEUKOCYTE ESTERASE NEG  SQUAMOUS EPITHELIAL/HPF RARE  WBC 0-2 WBC/hpf RBC NONE SEEN RBC/hpf BACTERIA NONE SEEN  CRYSTALS NONE SEEN  CASTS NONE SEEN   Old records or history reviewed: notes from Dr. Loren Racer office as above.  The following clinical lab reports were reviewed:  PSA is as above.    Assessment Assessed        I have discussed with the patient the need for further evaluation of his elevated PSA if I find it remains elevated. We have discussed the options which would be continued observation with serial DRE and PSAs versus proceeding with further evaluation at this time with TRUS/Bx. We have discussed the possible risk of progression and spread of prostate cancer if present currently as well as the fact that typically prostate cancer tends to be a relatively slow-growing  form of cancer that typically would not progress significantly over the relative short period of time between serial examinations. We also have discussed proceeding at this time with a prostate biopsy and I therefore have gone over the procedure with him in detail. We have discussed its potential risks and complications as well as limitations.   Indicates that he was placed on antibiotics he did not get that filled. There is no indication whatsoever of infection. Current guidelines therefore do not recommend antibiotic therapy for elevated PSA in the absence of symptoms suggestive of infection.  As far as proceeding with a circumcision we did discuss The procedure, the incision used, the risks and complications, the probability of success, the outpatient nature of the procedure and in the anticipated postoperative course. He understands and has elected to proceed.    Plan  1. His PSA was found to have returned to normal. I therefore recommended a repeat DRE and PSA in six-month period 2. He will be scheduled for circumcision under general anesthesia as an outpatient.

## 2013-07-19 DIAGNOSIS — N478 Other disorders of prepuce: Secondary | ICD-10-CM | POA: Diagnosis present

## 2013-07-19 NOTE — H&P (Signed)
[name] is a 57 year old male patient of Dr. Sinclair Grooms seen in office consultation today for and elevated PSA and balanitis.   History of Present Illness            Right hydrocele: He had a history of a right hydrocele that over time progressively increased in size to the point where it was quite large and becoming uncomfortable and difficult to manage.  Tx: On 11/13/10 he underwent a right hydrocelectomy.      LUTS: He had voiding symptoms that consisted of urinary frequency, nocturia and some occasional hesitancy but reports his stream is strong for the most part. He does get up about 1-2 times every night. He says he drinks a lot of water and fruit juices during the day. He reports that when he does urinate it is not a small amount but rather a large amount each time. He therefore underwent a trial of alpha blockade therapy with Rapaflo but noted no significant change in his voiding symptoms on this medication. His chief complaint was that of nocturia which resulted in interruption of his sleep.  Interval history: He reports he was recently found to have an elevated PSA. He was told it may be elevated do to a lab error/spurious value and apparently was placed on antibiotics but he did not get that filled. He has no voiding symptoms and specifically denies any dysuria or change in his voiding pattern. He's not seen any hematuria. He denies any bone pain or unexplained weight loss. He has not had a history of UTIs or prostatitis in the past.  He also indicated that he has been having pain in the area of his foreskin when he has an erection. This has been occurring for a number of years. He has no difficulty retracting his foreskin. He also does not report infections beneath the foreskin in the past.  PSAs:  12/11 - 2.6 11/12 - 3.15 7/14 - 10.02   Past Medical History Problems  1. History of  Heartburn With Regurgitation 787.1  Surgical History Problems  1. History of  Neurorrhaphy Right  Thumb 2. History of  Surgery Tunica Vaginalis Excision Of Hydrocele Right  Current Meds 1. Acyclovir 800 MG Oral Tablet; Therapy: 21Nov2011 to 2. Ceftin 250 MG Oral Tablet; Therapy: (Recorded:01Dec2011) to 3. ExeFen-IR 60-400 MG Oral Tablet; Therapy: 29Nov2011 to 4. Fish Oil CAPS; Therapy: (Recorded:24Mar2011) to 5. Ibuprofen TABS; Therapy: (Recorded:01Dec2011) to  Allergies Medication  1. Azithromycin TABS 2. Erythromycin Derivatives  Family History Problems  1. Paternal history of  Diabetes Mellitus V18.0 2. Maternal grandfather's history of  Diabetes Mellitus V18.0 3. Paternal grandfather's history of  Diabetes Mellitus V18.0 4. Family history of  Family Health Status Number Of Children 5. Maternal history of  Hypertension V17.49 6. Paternal history of  Hypertension V17.49 7. Sororal history of  Hypertension V17.49 8. Maternal grandmother's history of  Stroke Syndrome V17.1  Social History Problems    Caffeine Use   Marital History - Divorced V61.03  Review of Systems Genitourinary, constitutional, skin, eye, otolaryngeal, hematologic/lymphatic, cardiovascular, pulmonary, endocrine, musculoskeletal, gastrointestinal, neurological and psychiatric system(s) were reviewed and pertinent findings if present are noted.  Genitourinary: nocturia.  Integumentary: pruritus.  ENT: sinus problems.  Musculoskeletal: back pain and joint pain.    Vitals Vital Signs  BMI Calculated: 22.05 BSA Calculated: 1.87 Height: 5 ft 10 in Weight: 154 lb  Blood Pressure: 104 / 75 Heart Rate: 61  Physical Exam Constitutional: Well nourished and well developed . No acute  distress.  ENT:. The ears and nose are normal in appearance.  Neck: The appearance of the neck is normal and no neck mass is present.  Pulmonary: No respiratory distress and normal respiratory rhythm and effort.  Cardiovascular: Heart rate and rhythm are normal . No peripheral edema.  Abdomen: The abdomen is soft and  nontender. No masses are palpated. No CVA tenderness. No hernias are palpable. No hepatosplenomegaly noted.  Rectal: Rectal exam demonstrates normal sphincter tone, no tenderness and no masses. The prostate has no nodularity and is not tender. The left seminal vesicle is nonpalpable. The right seminal vesicle is nonpalpable. The perineum is normal on inspection.  Genitourinary: Examination of the penis demonstrates no discharge, no masses, no lesions and a normal meatus. The penis is uncircumcised. The scrotum is without lesions. The right epididymis is palpably normal and non-tender. The left epididymis is palpably normal and non-tender. The right testis is non-tender and without masses. The left testis is non-tender and without masses.  Lymphatics: The femoral and inguinal nodes are not enlarged or tender.  Skin: Normal skin turgor, no visible rash and no visible skin lesions.  Neuro/Psych:. Mood and affect are appropriate.    Results/Data Urine [Data Includes: Last 1 Day]   30Jul2014  COLOR YELLOW   APPEARANCE CLEAR   SPECIFIC GRAVITY 1.015   pH 6.0   GLUCOSE NEG mg/dL  BILIRUBIN NEG   KETONE NEG mg/dL  BLOOD TRACE   PROTEIN NEG mg/dL  UROBILINOGEN 1 mg/dL  NITRITE NEG   LEUKOCYTE ESTERASE NEG   SQUAMOUS EPITHELIAL/HPF RARE   WBC 0-2 WBC/hpf  RBC NONE SEEN RBC/hpf  BACTERIA NONE SEEN   CRYSTALS NONE SEEN   CASTS NONE SEEN    Old records or history reviewed: notes from Dr. Loren Racer office as above.  The following clinical lab reports were reviewed:  PSA is as above.    Assessment Assessed  1. PSA,Elevated 790.93 2. Referral diagnosis of  Balanitis 607.1 3. Benign Prostatic Hypertrophy Without Urinary Obstruction With Other Lower Urinary Tract  Symptoms 600.00      I have discussed with the patient the need for further evaluation of his elevated PSA if I find it remains elevated. We have discussed the options which would be continued observation with serial DRE and PSAs  versus proceeding with further evaluation at this time with TRUS/Bx. We have discussed the possible risk of progression and spread of prostate cancer if present currently as well as the fact that typically prostate cancer tends to be a relatively slow-growing form of cancer that typically would not progress significantly over the relative short period of time between serial examinations. We also have discussed proceeding at this time with a prostate biopsy and I therefore have gone over the procedure with him in detail. We have discussed its potential risks and complications as well as limitations.   Indicates that he was placed on antibiotics he did not get that filled. There is no indication whatsoever of infection. Current guidelines therefore do not recommend antibiotic therapy for elevated PSA in the absence of symptoms suggestive of infection.  He does desire circumcision. I therefore discussed the procedure with him in detail. We went over the incision used, the risks and complications, the probability of success, the outpatient nature of the procedure as well as the anticipated postoperative course. His questions were answered to his satisfaction regarding his procedure and he has elected to proceed.    Plan He will be scheduled for a circumcision as  an outpatient.

## 2013-07-20 ENCOUNTER — Ambulatory Visit (HOSPITAL_BASED_OUTPATIENT_CLINIC_OR_DEPARTMENT_OTHER): Payer: Federal, State, Local not specified - PPO | Admitting: Anesthesiology

## 2013-07-20 ENCOUNTER — Ambulatory Visit (HOSPITAL_BASED_OUTPATIENT_CLINIC_OR_DEPARTMENT_OTHER)
Admission: RE | Admit: 2013-07-20 | Discharge: 2013-07-20 | Disposition: A | Discharge status: Home / Self Care | Payer: Federal, State, Local not specified - PPO | Source: Ambulatory Visit | Attending: Urology | Admitting: Urology

## 2013-07-20 ENCOUNTER — Encounter (HOSPITAL_BASED_OUTPATIENT_CLINIC_OR_DEPARTMENT_OTHER)
Admission: RE | Disposition: A | Discharge status: Home / Self Care | Payer: Self-pay | Source: Ambulatory Visit | Attending: Urology

## 2013-07-20 ENCOUNTER — Encounter (HOSPITAL_BASED_OUTPATIENT_CLINIC_OR_DEPARTMENT_OTHER): Payer: Self-pay | Admitting: Anesthesiology

## 2013-07-20 ENCOUNTER — Encounter (HOSPITAL_BASED_OUTPATIENT_CLINIC_OR_DEPARTMENT_OTHER): Payer: Self-pay | Admitting: *Deleted

## 2013-07-20 DIAGNOSIS — N471 Phimosis: Secondary | ICD-10-CM | POA: Insufficient documentation

## 2013-07-20 DIAGNOSIS — K219 Gastro-esophageal reflux disease without esophagitis: Secondary | ICD-10-CM | POA: Insufficient documentation

## 2013-07-20 DIAGNOSIS — N478 Other disorders of prepuce: Secondary | ICD-10-CM

## 2013-07-20 HISTORY — DX: Balanitis: N48.1

## 2013-07-20 HISTORY — DX: Dermatitis, unspecified: L30.9

## 2013-07-20 HISTORY — DX: Benign prostatic hyperplasia without lower urinary tract symptoms: N40.0

## 2013-07-20 HISTORY — DX: Gastro-esophageal reflux disease without esophagitis: K21.9

## 2013-07-20 HISTORY — DX: Osteoarthritis of knee, unspecified: M17.9

## 2013-07-20 HISTORY — DX: Deficiency of other specified B group vitamins: E53.8

## 2013-07-20 HISTORY — PX: CIRCUMCISION: SHX1350

## 2013-07-20 HISTORY — DX: Low back pain: M54.5

## 2013-07-20 HISTORY — DX: Low back pain, unspecified: M54.50

## 2013-07-20 HISTORY — DX: Unspecified hemorrhoids: K64.9

## 2013-07-20 HISTORY — DX: Unilateral primary osteoarthritis, unspecified knee: M17.10

## 2013-07-20 SURGERY — CIRCUMCISION, ADULT
Anesthesia: General | Site: Penis | Wound class: Clean

## 2013-07-20 MED ORDER — LACTATED RINGERS IV SOLN
INTRAVENOUS | Status: DC
  Administered 2013-07-20 (×2): via INTRAVENOUS
  Filled 2013-07-20: qty 1000

## 2013-07-20 MED ORDER — PROMETHAZINE HCL 25 MG/ML IJ SOLN
6.2500 mg | INTRAMUSCULAR | Status: DC | PRN
  Filled 2013-07-20: qty 1

## 2013-07-20 MED ORDER — ONDANSETRON HCL 4 MG/2ML IJ SOLN
INTRAMUSCULAR | Status: DC | PRN
  Administered 2013-07-20: 4 mg via INTRAVENOUS

## 2013-07-20 MED ORDER — KETOROLAC TROMETHAMINE 30 MG/ML IJ SOLN
15.0000 mg | Freq: Once | INTRAMUSCULAR | Status: DC | PRN
  Filled 2013-07-20: qty 1

## 2013-07-20 MED ORDER — MIDAZOLAM HCL 5 MG/5ML IJ SOLN
INTRAMUSCULAR | Status: DC | PRN
  Administered 2013-07-20: 2 mg via INTRAVENOUS

## 2013-07-20 MED ORDER — PROPOFOL 10 MG/ML IV BOLUS
INTRAVENOUS | Status: DC | PRN
  Administered 2013-07-20: 200 mg via INTRAVENOUS

## 2013-07-20 MED ORDER — FENTANYL CITRATE 0.05 MG/ML IJ SOLN
INTRAMUSCULAR | Status: DC | PRN
  Administered 2013-07-20: 50 ug via INTRAVENOUS
  Administered 2013-07-20 (×2): 25 ug via INTRAVENOUS

## 2013-07-20 MED ORDER — CEFAZOLIN SODIUM-DEXTROSE 2-3 GM-% IV SOLR
2.0000 g | INTRAVENOUS | Status: AC
  Administered 2013-07-20: 2 g via INTRAVENOUS
  Filled 2013-07-20: qty 50

## 2013-07-20 MED ORDER — DEXAMETHASONE SODIUM PHOSPHATE 4 MG/ML IJ SOLN
INTRAMUSCULAR | Status: DC | PRN
  Administered 2013-07-20: 10 mg via INTRAVENOUS

## 2013-07-20 MED ORDER — LIDOCAINE HCL (CARDIAC) 20 MG/ML IV SOLN
INTRAVENOUS | Status: DC | PRN
  Administered 2013-07-20: 60 mg via INTRAVENOUS

## 2013-07-20 MED ORDER — OXYCODONE-ACETAMINOPHEN 10-325 MG PO TABS: 1.0000 | ORAL_TABLET | ORAL | Status: DC | PRN

## 2013-07-20 MED ORDER — FENTANYL CITRATE 0.05 MG/ML IJ SOLN
25.0000 ug | INTRAMUSCULAR | Status: DC | PRN
  Filled 2013-07-20: qty 1

## 2013-07-20 MED ORDER — BUPIVACAINE HCL 0.5 % IJ SOLN
INTRAMUSCULAR | Status: DC | PRN
  Administered 2013-07-20: 20 mL

## 2013-07-20 MED ORDER — BACITRACIN-NEOMYCIN-POLYMYXIN 400-5-5000 EX OINT
TOPICAL_OINTMENT | CUTANEOUS | Status: DC | PRN
  Administered 2013-07-20: 1 via TOPICAL

## 2013-07-20 MED ORDER — 0.9 % SODIUM CHLORIDE (POUR BTL) OPTIME
TOPICAL | Status: DC | PRN
  Administered 2013-07-20: 1000 mL

## 2013-07-20 SURGICAL SUPPLY — 30 items
BANDAGE CO FLEX L/F 1IN X 5YD (GAUZE/BANDAGES/DRESSINGS) IMPLANT
BANDAGE CO FLEX L/F 2IN X 5YD (GAUZE/BANDAGES/DRESSINGS) IMPLANT
BANDAGE COBAN STERILE 2 (GAUZE/BANDAGES/DRESSINGS) ×2 IMPLANT
BLADE SURG 15 STRL LF DISP TIS (BLADE) ×1 IMPLANT
BLADE SURG 15 STRL SS (BLADE) ×2
BLADE SURG ROTATE 9660 (MISCELLANEOUS) ×2 IMPLANT
CLOTH BEACON ORANGE TIMEOUT ST (SAFETY) ×2 IMPLANT
COVER MAYO STAND STRL (DRAPES) ×2 IMPLANT
COVER TABLE BACK 60X90 (DRAPES) ×2 IMPLANT
DRAPE PED LAPAROTOMY (DRAPES) ×2 IMPLANT
ELECT REM PT RETURN 9FT ADLT (ELECTROSURGICAL) ×2
ELECTRODE REM PT RTRN 9FT ADLT (ELECTROSURGICAL) ×1 IMPLANT
GAUZE SPONGE 4X4 12PLY STRL LF (GAUZE/BANDAGES/DRESSINGS) ×2 IMPLANT
GLOVE BIO SURGEON STRL SZ 6.5 (GLOVE) ×2 IMPLANT
GLOVE BIO SURGEON STRL SZ8 (GLOVE) ×2 IMPLANT
GLOVE INDICATOR 6.5 STRL GRN (GLOVE) ×4 IMPLANT
GOWN PREVENTION PLUS LG XLONG (DISPOSABLE) ×2 IMPLANT
GOWN STRL REIN XL XLG (GOWN DISPOSABLE) ×2 IMPLANT
IV NS IRRIG 3000ML ARTHROMATIC (IV SOLUTION) IMPLANT
NEEDLE HYPO 22GX1.5 SAFETY (NEEDLE) ×2 IMPLANT
NS IRRIG 500ML POUR BTL (IV SOLUTION) IMPLANT
PACK BASIN DAY SURGERY FS (CUSTOM PROCEDURE TRAY) ×2 IMPLANT
PENCIL BUTTON HOLSTER BLD 10FT (ELECTRODE) ×2 IMPLANT
SUT CHROMIC 3 0 SH 27 (SUTURE) ×6 IMPLANT
SUT CHROMIC 4 0 RB 1X27 (SUTURE) ×2 IMPLANT
SYR CONTROL 10ML LL (SYRINGE) ×2 IMPLANT
TOWEL OR 17X24 6PK STRL BLUE (TOWEL DISPOSABLE) ×4 IMPLANT
TRAY DSU PREP LF (CUSTOM PROCEDURE TRAY) ×2 IMPLANT
WATER STERILE IRR 3000ML UROMA (IV SOLUTION) IMPLANT
WATER STERILE IRR 500ML POUR (IV SOLUTION) ×2 IMPLANT

## 2013-07-20 NOTE — Transfer of Care (Signed)
Immediate Anesthesia Transfer of Care Note  Patient: Jorge Turner  Procedure(s) Performed: Procedure(s) (LRB): CIRCUMCISION ADULT (N/A)  Patient Location: PACU  Anesthesia Type: General  Level of Consciousness: awake, alert  and oriented  Airway & Oxygen Therapy: Patient Spontanous Breathing and Patient connected to face mask oxygen  Post-op Assessment: Report given to PACU RN and Post -op Vital signs reviewed and stable  Post vital signs: Reviewed and stable  Complications: No apparent anesthesia complications

## 2013-07-20 NOTE — Anesthesia Postprocedure Evaluation (Signed)
  Anesthesia Post-op Note  Patient: Jorge Turner  Procedure(s) Performed: Procedure(s) (LRB): CIRCUMCISION ADULT (N/A)  Patient Location: PACU  Anesthesia Type: General  Level of Consciousness: awake and alert   Airway and Oxygen Therapy: Patient Spontanous Breathing  Post-op Pain: mild  Post-op Assessment: Post-op Vital signs reviewed, Patient's Cardiovascular Status Stable, Respiratory Function Stable, Patent Airway and No signs of Nausea or vomiting  Last Vitals:  Filed Vitals:   07/20/13 0845  BP: 109/67  Pulse: 52  Temp:   Resp: 13    Post-op Vital Signs: stable   Complications: No apparent anesthesia complications

## 2013-07-20 NOTE — Interval H&P Note (Signed)
History and Physical Interval Note:  07/20/2013 7:22 AM  Jorge Turner  has presented today for surgery, with the diagnosis of BALANITIS  The various methods of treatment have been discussed with the patient and family. After consideration of risks, benefits and other options for treatment, the patient has consented to  Procedure(s): CIRCUMCISION ADULT (N/A) as a surgical intervention .  The patient's history has been reviewed, patient examined, no change in status, stable for surgery.  I have reviewed the patient's chart and labs.  Questions were answered to the patient's satisfaction.     Jorge Turner C   

## 2013-07-20 NOTE — Interval H&P Note (Signed)
History and Physical Interval Note:  07/20/2013 7:22 AM  Jorge Turner  has presented today for surgery, with the diagnosis of BALANITIS  The various methods of treatment have been discussed with the patient and family. After consideration of risks, benefits and other options for treatment, the patient has consented to  Procedure(s): CIRCUMCISION ADULT (N/A) as a surgical intervention .  The patient's history has been reviewed, patient examined, no change in status, stable for surgery.  I have reviewed the patient's chart and labs.  Questions were answered to the patient's satisfaction.     [place]

## 2013-07-20 NOTE — Anesthesia Procedure Notes (Signed)
Procedure Name: LMA Insertion Date/Time: 07/20/2013 7:33 AM Performed by: Norva Pavlov Pre-anesthesia Checklist: Patient identified, Emergency Drugs available, Suction available and Patient being monitored Patient Re-evaluated:Patient Re-evaluated prior to inductionOxygen Delivery Method: Circle System Utilized Preoxygenation: Pre-oxygenation with 100% oxygen Intubation Type: IV induction Ventilation: Mask ventilation without difficulty LMA: LMA inserted LMA Size: 4.0 Number of attempts: 1 Airway Equipment and Method: bite block Placement Confirmation: positive ETCO2 Tube secured with: Tape Dental Injury: Teeth and Oropharynx as per pre-operative assessment

## 2013-07-20 NOTE — Op Note (Signed)
PATIENT:  Jorge Turner  PRE-OPERATIVE DIAGNOSIS: Redundant foreskin with history of balanitis  POST-OPERATIVE DIAGNOSIS: Same  PROCEDURE: Circumcision  SURGEON:  Garnett Farm  INDICATION: Jorge Turner is a 57 year old male who reported experiencing pain in the area of the foreskin especially with correction. This had been occurring a number of years. He elected to proceed with circumcision.  ANESTHESIA:  General  EBL:  Minimal  DRAINS: None  LOCAL MEDICATIONS USED:  20 cc of Marcaine  SPECIMEN:  None  Description of procedure: After informed consent the patient was brought to the major or, placed on the table in the supine position and administered general anesthesia. He then had his genitalia sterilely prepped and draped in a standard fashion. An official timeout was then performed.  A dorsal penile block was performed by injecting Marcaine at the base of the penis at the 12:00 position. I then marked the location of the corona on his shaft skin with a surgical marker. I then retracted the foreskin and made a circumcising incision circumferentially approximately 4 mm from the corona. I noted a tethering frenulum. This was released. I then replaced the foreskin into its normal anatomic position and incised along the area previously marked circumferentially as well. I then excised the foreskin from the penis using sharp and blunt technique. Bleeding points were cauterized.  I reapproximated the frenulum edges with interrupted 4-0 chromic suture. I then placed a 3-0 chromic at the 6:00 position in a U stitch configuration. A second stitch was placed at the 12:00 position reapproximating the skin edges which were then approximated with running 3-0 chromic.  I applied Neosporin, gently wrapped 4 x 4 gauze around the incision and then loosely applied Coban. The patient tolerated the procedure well with no intraoperative complications. Needle, sponge and instrument counts were  correct.  PLAN OF CARE: Discharge to home after PACU  PATIENT DISPOSITION:  PACU - hemodynamically stable.

## 2013-07-20 NOTE — Anesthesia Preprocedure Evaluation (Signed)
Anesthesia Evaluation  Patient identified by MRN, date of birth, ID band Patient awake    Reviewed: Allergy & Precautions, H&P , NPO status , Patient's Chart, lab work & pertinent test results  Airway Mallampati: II TM Distance: >3 FB Neck ROM: Full    Dental no notable dental hx.    Pulmonary neg pulmonary ROS,  breath sounds clear to auscultation  Pulmonary exam normal       Cardiovascular negative cardio ROS  Rhythm:Regular Rate:Normal     Neuro/Psych negative neurological ROS  negative psych ROS   GI/Hepatic Neg liver ROS, GERD-  Medicated,  Endo/Other  negative endocrine ROS  Renal/GU negative Renal ROS  negative genitourinary   Musculoskeletal negative musculoskeletal ROS (+)   Abdominal   Peds negative pediatric ROS (+)  Hematology negative hematology ROS (+)   Anesthesia Other Findings   Reproductive/Obstetrics negative OB ROS                           Anesthesia Physical Anesthesia Plan  ASA: II  Anesthesia Plan: General   Post-op Pain Management:    Induction: Intravenous  Airway Management Planned: LMA  Additional Equipment:   Intra-op Plan:   Post-operative Plan: Extubation in OR  Informed Consent: I have reviewed the patients History and Physical, chart, labs and discussed the procedure including the risks, benefits and alternatives for the proposed anesthesia with the patient or authorized representative who has indicated his/her understanding and acceptance.     Plan Discussed with: CRNA and Surgeon  Anesthesia Plan Comments:         Anesthesia Quick Evaluation

## 2013-07-23 ENCOUNTER — Encounter (HOSPITAL_BASED_OUTPATIENT_CLINIC_OR_DEPARTMENT_OTHER): Payer: Self-pay | Admitting: Urology

## 2013-07-23 LAB — POCT HEMOGLOBIN-HEMACUE: Hemoglobin: 14.2 g/dL (ref 13.0–17.0)

## 2013-09-10 ENCOUNTER — Ambulatory Visit (INDEPENDENT_AMBULATORY_CARE_PROVIDER_SITE_OTHER): Payer: Federal, State, Local not specified - PPO | Admitting: Internal Medicine

## 2013-09-10 ENCOUNTER — Ambulatory Visit (INDEPENDENT_AMBULATORY_CARE_PROVIDER_SITE_OTHER): Payer: Federal, State, Local not specified - PPO | Admitting: Family Medicine

## 2013-09-10 ENCOUNTER — Encounter: Payer: Self-pay | Admitting: Internal Medicine

## 2013-09-10 ENCOUNTER — Encounter: Payer: Self-pay | Admitting: Family Medicine

## 2013-09-10 VITALS — BP 114/80 | HR 80 | Temp 97.7°F | Resp 16 | Wt 150.0 lb

## 2013-09-10 VITALS — BP 114/80 | HR 80 | Wt 150.0 lb

## 2013-09-10 DIAGNOSIS — M545 Low back pain, unspecified: Secondary | ICD-10-CM

## 2013-09-10 DIAGNOSIS — M25569 Pain in unspecified knee: Secondary | ICD-10-CM

## 2013-09-10 DIAGNOSIS — E538 Deficiency of other specified B group vitamins: Secondary | ICD-10-CM

## 2013-09-10 DIAGNOSIS — R634 Abnormal weight loss: Secondary | ICD-10-CM

## 2013-09-10 DIAGNOSIS — M25561 Pain in right knee: Secondary | ICD-10-CM

## 2013-09-10 DIAGNOSIS — Z23 Encounter for immunization: Secondary | ICD-10-CM

## 2013-09-10 MED ORDER — MELOXICAM 15 MG PO TABS: 15.0000 mg | ORAL_TABLET | Freq: Every day | ORAL | Status: DC

## 2013-09-10 NOTE — Assessment & Plan Note (Signed)
Wt Readings from Last 3 Encounters:  09/10/13 150 lb (68.04 kg)  07/20/13 150 lb (68.04 kg)  07/20/13 150 lb (68.04 kg)

## 2013-09-10 NOTE — Assessment & Plan Note (Signed)
R>L worse w/walking up the steps ?patella chondromalacia vs other Sports med consult

## 2013-09-10 NOTE — Addendum Note (Signed)
Addended by: Merrilyn Puma on: 09/10/2013 02:29 PM   Modules accepted: Orders

## 2013-09-10 NOTE — Progress Notes (Signed)
  I'm seeing this patient by the request  of:  Sonda Primes, MD   CC: bilateral knee pain  HPI: 57 yo male coming in with bilateral knee pain.  Patient states has had it for years but worsening, mostly above knee and on th eoutside, chronic ache with sharp pain that is worse going up stairs. Can ache at night, denies radiation, swelling or numbness, no true injury. Wants to bowl and has no pain with it.  Severity 7/10.   Past medical, surgical, family and social history reviewed. Medications reviewed all in the electronic medical record.   Review of Systems: No headache, visual changes, nausea, vomiting, diarrhea, constipation, dizziness, abdominal pain, skin rash, fevers, chills, night sweats, weight loss, swollen lymph nodes, body aches, joint swelling, muscle aches, chest pain, shortness of breath, mood changes.   Objective:    Blood pressure 114/80, pulse 80, weight 150 lb (68.04 kg).   General: No apparent distress alert and oriented x3 mood and affect normal, dressed appropriately.  HEENT: Pupils equal, extraocular movements intact Respiratory: Patient's speak in full sentences and does not appear short of breath Cardiovascular: No lower extremity edema, non tender, no erythema Skin: Warm dry intact with no signs of infection or rash on extremities or on axial skeleton. Abdomen: Soft nontender Neuro: Cranial nerves II through XII are intact, neurovascularly intact in all extremities with 2+ DTRs and 2+ pulses. Lymph: No lymphadenopathy of posterior or anterior cervical chain or axillae bilaterally.  Gait normal with good balance and coordination.  MSK: Non tender with full range of motion and good stability and symmetric strength and tone of shoulders, elbows, wrist, hip, and ankles bilaterally.  Knee: bilateral Normal to inspection with no erythema or effusion or obvious bony abnormalities except for bilateral atrophy.  Palpation normal with no warmth, joint line tenderness,  patellar tenderness, or condyle tenderness. ROM full in flexion and extension and lower leg rotation. Ligaments with solid consistent endpoints including ACL, PCL, LCL, MCL. Negative Mcmurray's, Apley's, and Thessalonian tests. Painful patellar compression. Patellar glide with crepitus. Patellar and quadriceps tendons unremarkable. Chronic tight hamstrings bilaterally. With significant weakness and numbness of the quadriceps bilaterally.    Impression and Recommendations:     This case required medical decision making of moderate complexity.

## 2013-09-10 NOTE — Assessment & Plan Note (Signed)
Continue with current prescription therapy as reflected on the Med list.  

## 2013-09-10 NOTE — Progress Notes (Signed)
   Subjective:    HPI  F/u LBP, knee pain when walking upstairs - not better; L groin pain - resolved   Review of Systems  Constitutional: Negative for appetite change, fatigue and unexpected weight change.  HENT: Negative for congestion, nosebleeds, sneezing, sore throat and trouble swallowing.   Eyes: Negative for itching and visual disturbance.  Respiratory: Negative for cough.   Cardiovascular: Negative for chest pain, palpitations and leg swelling.  Gastrointestinal: Negative for nausea, diarrhea, blood in stool and abdominal distention.  Genitourinary: Positive for urgency and difficulty urinating (rare). Negative for frequency, hematuria, decreased urine volume and scrotal swelling.  Musculoskeletal: Negative for back pain, gait problem, joint swelling and neck pain.  Skin: Negative for rash.  Neurological: Negative for dizziness, tremors, speech difficulty and weakness.  Psychiatric/Behavioral: Negative for suicidal ideas, sleep disturbance, dysphoric mood and agitation. The patient is not nervous/anxious.        Objective:   Physical Exam  Constitutional: He is oriented to person, place, and time. He appears well-developed. No distress.  HENT:  Head: Normocephalic and atraumatic.  Right Ear: External ear normal.  Left Ear: External ear normal.  Nose: Nose normal.  Mouth/Throat: Oropharynx is clear and moist. No oropharyngeal exudate.  Eyes: Conjunctivae and EOM are normal. Pupils are equal, round, and reactive to light. Right eye exhibits no discharge. Left eye exhibits no discharge. No scleral icterus.  Neck: Normal range of motion. Neck supple. No JVD present. No tracheal deviation present. No thyromegaly present.  Cardiovascular: Normal rate, regular rhythm, normal heart sounds and intact distal pulses.  Exam reveals no gallop and no friction rub.   No murmur heard. Pulmonary/Chest: Effort normal and breath sounds normal. No stridor. No respiratory distress. He has no  wheezes. He has no rales. He exhibits no tenderness.  Abdominal: Soft. Bowel sounds are normal. He exhibits no distension and no mass. There is no tenderness. There is no rebound and no guarding.  Genitourinary: Rectum normal and penis normal. Guaiac negative stool. No penile tenderness.  Prostate 1+  Musculoskeletal: Normal range of motion. He exhibits no edema and no tenderness.  Lymphadenopathy:    He has no cervical adenopathy.  Neurological: He is alert and oriented to person, place, and time. He has normal reflexes. No cranial nerve deficit. He exhibits normal muscle tone. Coordination normal.  Skin: Skin is warm and dry. Rash (hypopigmented maculas) noted. He is not diaphoretic. No erythema. No pallor.  Psychiatric: He has a normal mood and affect. His behavior is normal. Judgment and thought content normal.   B knees - no swelling      Assessment & Plan:

## 2013-09-10 NOTE — Assessment & Plan Note (Signed)
Patient does have bilateral patellofemoral syndrome secondary to severe atrophy of the quadriceps bilaterally. The patient is just not very active and has not done any weightlifting or any exercises outside of bowling. Home exercise programs given Patient was fitted with braces bilaterally. These were placed by me. Patient will do anti-inflammatories Patient will come back in 3-4 weeks and is continuing to have pain we'll consider corticosteroid injections.

## 2013-09-10 NOTE — Patient Instructions (Signed)
Very nice to meet you Do exercises daily Ice 20 minutes 3 times a day Wear braces with activity.  Come back and see me again in 4 weeks.

## 2013-09-21 ENCOUNTER — Telehealth: Payer: Self-pay | Admitting: *Deleted

## 2013-09-21 NOTE — Telephone Encounter (Signed)
lmovm for pt to return call.  

## 2013-09-21 NOTE — Telephone Encounter (Signed)
Pt states the braces Dr. Katrinka Blazing gave him are too small. He wants to bring them back and exchange for a larger size.

## 2013-09-24 NOTE — Telephone Encounter (Signed)
Spoke to pt, advised him we are currently out of that particular brace & i will call him when we get them in for him to come in & exchange for a larger size. Pt understood.

## 2013-12-08 ENCOUNTER — Other Ambulatory Visit: Payer: Self-pay | Admitting: Internal Medicine

## 2013-12-10 NOTE — Telephone Encounter (Signed)
Refill done.  

## 2014-01-01 ENCOUNTER — Ambulatory Visit (INDEPENDENT_AMBULATORY_CARE_PROVIDER_SITE_OTHER)
Admission: RE | Admit: 2014-01-01 | Discharge: 2014-01-01 | Disposition: A | Discharge status: Home / Self Care | Payer: Federal, State, Local not specified - PPO | Source: Ambulatory Visit | Attending: Internal Medicine | Admitting: Internal Medicine

## 2014-01-01 ENCOUNTER — Encounter: Payer: Self-pay | Admitting: Internal Medicine

## 2014-01-01 ENCOUNTER — Ambulatory Visit (INDEPENDENT_AMBULATORY_CARE_PROVIDER_SITE_OTHER): Payer: Federal, State, Local not specified - PPO | Admitting: Internal Medicine

## 2014-01-01 VITALS — BP 110/72 | HR 76 | Temp 97.4°F | Resp 16 | Wt 152.0 lb

## 2014-01-01 DIAGNOSIS — H109 Unspecified conjunctivitis: Secondary | ICD-10-CM | POA: Insufficient documentation

## 2014-01-01 DIAGNOSIS — M25561 Pain in right knee: Secondary | ICD-10-CM

## 2014-01-01 DIAGNOSIS — M25562 Pain in left knee: Principal | ICD-10-CM

## 2014-01-01 DIAGNOSIS — M25569 Pain in unspecified knee: Secondary | ICD-10-CM

## 2014-01-01 DIAGNOSIS — E538 Deficiency of other specified B group vitamins: Secondary | ICD-10-CM

## 2014-01-01 DIAGNOSIS — N320 Bladder-neck obstruction: Secondary | ICD-10-CM

## 2014-01-01 DIAGNOSIS — M545 Low back pain, unspecified: Secondary | ICD-10-CM

## 2014-01-01 MED ORDER — ERYTHROMYCIN 5 MG/GM OP OINT: TOPICAL_OINTMENT | OPHTHALMIC | Status: DC

## 2014-01-01 NOTE — Progress Notes (Signed)
   Subjective:    HPI  F/u LBP, B knee pain when walking upstairs - not better; L groin pain - resolved C/o pus in the L eye in am x 1 mo He [facility] a [facility]   Review of Systems  Constitutional: Negative for appetite change, fatigue and unexpected weight change.  HENT: Negative for congestion, nosebleeds, sneezing, sore throat and trouble swallowing.   Eyes: Negative for itching and visual disturbance.  Respiratory: Negative for cough.   Cardiovascular: Negative for chest pain, palpitations and leg swelling.  Gastrointestinal: Negative for nausea, diarrhea, blood in stool and abdominal distention.  Genitourinary: Positive for urgency and difficulty urinating (rare). Negative for frequency, hematuria, decreased urine volume and scrotal swelling.  Musculoskeletal: Negative for back pain, gait problem, joint swelling and neck pain.  Skin: Negative for rash.  Neurological: Negative for dizziness, tremors, speech difficulty and weakness.  Psychiatric/Behavioral: Negative for suicidal ideas, sleep disturbance, dysphoric mood and agitation. The patient is not nervous/anxious.        Objective:   Physical Exam  Constitutional: He is oriented to person, place, and time. He appears well-developed. No distress.  HENT:  Head: Normocephalic and atraumatic.  Right Ear: External ear normal.  Left Ear: External ear normal.  Nose: Nose normal.  Mouth/Throat: Oropharynx is clear and moist. No oropharyngeal exudate.  Eyes: Conjunctivae and EOM are normal. Pupils are equal, round, and reactive to light. Right eye exhibits no discharge. Left eye exhibits no discharge. No scleral icterus.  Neck: Normal range of motion. Neck supple. No JVD present. No tracheal deviation present. No thyromegaly present.  Cardiovascular: Normal rate, regular rhythm, normal heart sounds and intact distal pulses.  Exam reveals no gallop and no friction rub.   No murmur heard. Pulmonary/Chest: Effort normal and  breath sounds normal. No stridor. No respiratory distress. He has no wheezes. He has no rales. He exhibits no tenderness.  Abdominal: Soft. Bowel sounds are normal. He exhibits no distension and no mass. There is no tenderness. There is no rebound and no guarding.  Genitourinary: Rectum normal and penis normal. Guaiac negative stool. No penile tenderness.  Prostate 1+  Musculoskeletal: Normal range of motion. He exhibits no edema and no tenderness.  Lymphadenopathy:    He has no cervical adenopathy.  Neurological: He is alert and oriented to person, place, and time. He has normal reflexes. No cranial nerve deficit. He exhibits normal muscle tone. Coordination normal.  Skin: Skin is warm and dry. Rash (hypopigmented maculas) noted. He is not diaphoretic. No erythema. No pallor.  Psychiatric: He has a normal mood and affect. His behavior is normal. Judgment and thought content normal.  LS pine is stiff w/decr ROM B knees - no swelling      Assessment & Plan:

## 2014-01-01 NOTE — Progress Notes (Deleted)
Pre visit review using our clinic review tool, if applicable. No additional management support is needed unless otherwise documented below in the visit note. 

## 2014-01-01 NOTE — Assessment & Plan Note (Signed)
X ray B

## 2014-01-01 NOTE — Assessment & Plan Note (Signed)
erythro ophth oint qhs

## 2014-01-03 NOTE — Assessment & Plan Note (Signed)
Continue with current prescription therapy as reflected on the Med list.  

## 2014-03-11 ENCOUNTER — Ambulatory Visit (INDEPENDENT_AMBULATORY_CARE_PROVIDER_SITE_OTHER): Payer: Federal, State, Local not specified - PPO | Admitting: Internal Medicine

## 2014-03-11 ENCOUNTER — Ambulatory Visit (INDEPENDENT_AMBULATORY_CARE_PROVIDER_SITE_OTHER)
Admission: RE | Admit: 2014-03-11 | Discharge: 2014-03-11 | Disposition: A | Discharge status: Home / Self Care | Payer: Federal, State, Local not specified - PPO | Source: Ambulatory Visit | Attending: Internal Medicine | Admitting: Internal Medicine

## 2014-03-11 ENCOUNTER — Encounter: Payer: Self-pay | Admitting: Internal Medicine

## 2014-03-11 VITALS — BP 104/70 | HR 80 | Temp 97.9°F | Resp 16 | Wt 151.0 lb

## 2014-03-11 DIAGNOSIS — S139XXA Sprain of joints and ligaments of unspecified parts of neck, initial encounter: Secondary | ICD-10-CM

## 2014-03-11 DIAGNOSIS — E538 Deficiency of other specified B group vitamins: Secondary | ICD-10-CM

## 2014-03-11 DIAGNOSIS — S161XXA Strain of muscle, fascia and tendon at neck level, initial encounter: Secondary | ICD-10-CM | POA: Insufficient documentation

## 2014-03-11 MED ORDER — TRAMADOL HCL 50 MG PO TABS: 50.0000 mg | ORAL_TABLET | Freq: Four times a day (QID) | ORAL | Status: DC | PRN
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
## 2014-03-11 NOTE — Progress Notes (Signed)
Pre visit review using our clinic review tool, if applicable. No additional management support is needed unless otherwise documented below in the visit note. 

## 2014-03-12 ENCOUNTER — Encounter: Payer: Self-pay | Admitting: Nurse Practitioner

## 2014-03-12 ENCOUNTER — Ambulatory Visit (INDEPENDENT_AMBULATORY_CARE_PROVIDER_SITE_OTHER): Payer: Federal, State, Local not specified - PPO | Admitting: Nurse Practitioner

## 2014-03-12 VITALS — BP 106/71 | HR 63 | Temp 98.2°F | Ht 70.0 in | Wt 152.0 lb

## 2014-03-12 DIAGNOSIS — J029 Acute pharyngitis, unspecified: Secondary | ICD-10-CM

## 2014-03-12 DIAGNOSIS — B009 Herpesviral infection, unspecified: Secondary | ICD-10-CM

## 2014-03-12 MED ORDER — VALACYCLOVIR HCL 1 G PO TABS: ORAL_TABLET | ORAL | Status: DC

## 2014-03-12 NOTE — Progress Notes (Signed)
Pre visit review using our clinic review tool, if applicable. No additional management support is needed unless otherwise documented below in the visit note. 

## 2014-03-12 NOTE — Progress Notes (Signed)
   Subjective:    Patient ID: Jorge Turner, male    DOB: 20-Aug-1956, 58 y.o.   MRN: [id number]  Oral Pain  This is a new problem. The current episode started yesterday. The problem occurs constantly. The problem has been rapidly worsening. The pain is mild. Pertinent negatives include no difficulty swallowing, facial pain, fever, oral bleeding, sinus pressure or thermal sensitivity. Associated symptoms comments: Lesion, blister-like inside lower lip left side. He has tried nothing for the symptoms.      Review of Systems  Constitutional: Positive for fatigue. Negative for fever, chills, activity change and appetite change.  HENT: Negative for congestion, dental problem, sinus pressure, sore throat and voice change.   Musculoskeletal: Negative for arthralgias.  Neurological: Positive for headaches.       Objective:   Physical Exam  Vitals reviewed. Constitutional: He is oriented to person, place, and time. He appears well-developed and well-nourished. No distress.  HENT:  Head: Normocephalic and atraumatic.  Right Ear: External ear normal.  Left Ear: External ear normal.  Mouth/Throat: No oropharyngeal exudate.    Eyes: Conjunctivae are normal. Right eye exhibits no discharge. Left eye exhibits no discharge.  Neck: No thyromegaly present.  Cardiovascular: Normal rate.   Pulmonary/Chest: Effort normal. No respiratory distress.  Lymphadenopathy:    He has no cervical adenopathy.  Neurological: He is alert and oriented to person, place, and time.  Skin: Skin is warm and dry.  Psychiatric: He has a normal mood and affect. His behavior is normal. Thought content normal.          Assessment & Plan:  1. HSV infection Left lower lip - valACYclovir (VALTREX) 1000 MG tablet; Take 2t po q12h times 2 doses. Repeat with next fever blister episode.  Dispense: 20 tablet; Refill: 0 See pt instructions for supportive care.  2. Oral Lichen planus, L buccal mucosa. DD: oral cancer See  dentist for eval. Plan biopsy if persistent.

## 2014-03-12 NOTE — Patient Instructions (Signed)
I think the lesion in your mouth is a fever blister. Take the medication prescribed. In future, you may repeat medication as soon as you experience tingling sensation on lip. Also, you may take 1000 mg lysine twice daily as soon as you experience tingling/irritation. Rinse mouth with equal parts listerene, hydrogen peroxide, & warm water twice daily until blister resolves.   Herpes Simplex Herpes simplex is generally classified as Type 1 or Type 2. Type 1 is generally the type that is responsible for cold sores. Type 2 is generally associated with sexually transmitted diseases. We now know that most of the thoughts on these viruses are inaccurate. We find that HSV1 is also present genitally and HSV2 can be present orally, but this will vary in different locations of the world. Herpes simplex is usually detected by doing a culture. Blood tests are also available for this virus; however, the accuracy is often not as good.  PREPARATION FOR TEST No preparation or fasting is necessary. NORMAL FINDINGS  No virus present  No HSV antigens or antibodies present Ranges for normal findings may vary among different laboratories and hospitals. You should always check with your doctor after having lab work or other tests done to discuss the meaning of your test results and whether your values are considered within normal limits. MEANING OF TEST  Your caregiver will go over the test results with you and discuss the importance and meaning of your results, as well as treatment options and the need for additional tests if necessary. OBTAINING THE TEST RESULTS  It is your responsibility to obtain your test results. Ask the lab or department performing the test when and how you will get your results. Document Released: 12/18/2004 Document Revised: 02/07/2012 Document Reviewed: 10/26/2008 [facility] Patient Information 2014 [place], [place].

## 2014-03-14 ENCOUNTER — Ambulatory Visit: Payer: Federal, State, Local not specified - PPO | Attending: Internal Medicine | Admitting: Physical Therapy

## 2014-03-14 DIAGNOSIS — IMO0001 Reserved for inherently not codable concepts without codable children: Secondary | ICD-10-CM | POA: Insufficient documentation

## 2014-03-14 DIAGNOSIS — M2560 Stiffness of unspecified joint, not elsewhere classified: Secondary | ICD-10-CM | POA: Insufficient documentation

## 2014-03-14 DIAGNOSIS — M2550 Pain in unspecified joint: Secondary | ICD-10-CM | POA: Insufficient documentation

## 2014-03-17 ENCOUNTER — Encounter: Payer: Self-pay | Admitting: Internal Medicine

## 2014-03-17 NOTE — Assessment & Plan Note (Signed)
Continue with current prescription therapy as reflected on the Med list.  

## 2014-03-19 ENCOUNTER — Ambulatory Visit: Payer: Federal, State, Local not specified - PPO

## 2014-03-21 ENCOUNTER — Ambulatory Visit: Payer: Federal, State, Local not specified - PPO | Admitting: Physical Therapy

## 2014-03-26 ENCOUNTER — Ambulatory Visit: Payer: Federal, State, Local not specified - PPO

## 2014-03-28 ENCOUNTER — Encounter: Payer: Federal, State, Local not specified - PPO | Admitting: Physical Therapy

## 2014-03-28 ENCOUNTER — Ambulatory Visit: Payer: Federal, State, Local not specified - PPO

## 2014-04-02 ENCOUNTER — Ambulatory Visit: Payer: Federal, State, Local not specified - PPO | Attending: Internal Medicine

## 2014-04-02 DIAGNOSIS — IMO0001 Reserved for inherently not codable concepts without codable children: Secondary | ICD-10-CM | POA: Insufficient documentation

## 2014-04-02 DIAGNOSIS — M2560 Stiffness of unspecified joint, not elsewhere classified: Secondary | ICD-10-CM | POA: Insufficient documentation

## 2014-04-02 DIAGNOSIS — M2550 Pain in unspecified joint: Secondary | ICD-10-CM | POA: Insufficient documentation

## 2014-04-04 ENCOUNTER — Ambulatory Visit: Payer: Federal, State, Local not specified - PPO

## 2014-04-08 ENCOUNTER — Encounter: Payer: Self-pay | Admitting: Internal Medicine

## 2014-04-08 ENCOUNTER — Ambulatory Visit (INDEPENDENT_AMBULATORY_CARE_PROVIDER_SITE_OTHER): Payer: Federal, State, Local not specified - PPO | Admitting: Internal Medicine

## 2014-04-08 VITALS — BP 110/72 | HR 76 | Temp 98.0°F | Resp 16 | Wt 152.0 lb

## 2014-04-08 DIAGNOSIS — S161XXA Strain of muscle, fascia and tendon at neck level, initial encounter: Secondary | ICD-10-CM

## 2014-04-08 DIAGNOSIS — S139XXA Sprain of joints and ligaments of unspecified parts of neck, initial encounter: Secondary | ICD-10-CM

## 2014-04-08 DIAGNOSIS — M545 Low back pain, unspecified: Secondary | ICD-10-CM

## 2014-04-08 MED ORDER — TRAMADOL HCL 50 MG PO TABS: 50.0000 mg | ORAL_TABLET | Freq: Four times a day (QID) | ORAL | Status: DC | PRN

## 2014-04-08 NOTE — Progress Notes (Signed)
Pre visit review using our clinic review tool, if applicable. No additional management support is needed unless otherwise documented below in the visit note. 
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
## 2014-04-08 NOTE — Assessment & Plan Note (Signed)
  Will switch to [facility] Continue with current Ibuprofen/Tramadol therapy as reflected on the Med list.

## 2014-05-10 ENCOUNTER — Telehealth: Payer: Self-pay | Admitting: Internal Medicine

## 2014-05-10 NOTE — Telephone Encounter (Signed)
No AP

## 2014-05-10 NOTE — Telephone Encounter (Signed)
Dr. Mamie Nick- Have you seen these?

## 2014-05-10 NOTE — Telephone Encounter (Signed)
Patient states that Dr. Alain Marion should have received office visit notes from [facility].  Patient is stating that he needs Dr. Alain Marion to review as soon as possible, bc patient needs a copy to file a claim.  Patient states he went to medical records to see if recorded but there was nothing recorded yet.  He states he has contacted [facility] and they state they had faxed his office notes twice.  Patient is requesting a call in regards.

## 2014-05-16 ENCOUNTER — Telehealth: Payer: Self-pay | Admitting: Internal Medicine

## 2014-05-16 NOTE — Telephone Encounter (Signed)
Received 10 pages, sent to [facility] sent to Dr. Alain Marion. 05/16/14/ss.

## 2014-06-10 ENCOUNTER — Ambulatory Visit (INDEPENDENT_AMBULATORY_CARE_PROVIDER_SITE_OTHER): Payer: Federal, State, Local not specified - PPO | Admitting: Internal Medicine

## 2014-06-10 ENCOUNTER — Encounter: Payer: Self-pay | Admitting: Internal Medicine

## 2014-06-10 VITALS — BP 110/72 | HR 76 | Temp 98.2°F | Resp 16 | Wt 154.0 lb

## 2014-06-10 DIAGNOSIS — M545 Low back pain, unspecified: Secondary | ICD-10-CM

## 2014-06-10 DIAGNOSIS — Z5189 Encounter for other specified aftercare: Secondary | ICD-10-CM

## 2014-06-10 DIAGNOSIS — S139XXA Sprain of joints and ligaments of unspecified parts of neck, initial encounter: Secondary | ICD-10-CM

## 2014-06-10 DIAGNOSIS — S161XXD Strain of muscle, fascia and tendon at neck level, subsequent encounter: Secondary | ICD-10-CM

## 2014-06-10 MED ORDER — OMEPRAZOLE 40 MG PO CPDR: 40.0000 mg | DELAYED_RELEASE_CAPSULE | Freq: Every day | ORAL | Status: DC

## 2014-06-10 NOTE — Assessment & Plan Note (Signed)
Now in PT at [facility]. Using Ibuprofen, Tramadol prn.

## 2014-06-10 NOTE — Progress Notes (Signed)
Pre visit review using our clinic review tool, if applicable. No additional management support is needed unless otherwise documented below in the visit note. 
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
## 2014-06-19 ENCOUNTER — Other Ambulatory Visit: Payer: Self-pay | Admitting: Internal Medicine

## 2014-09-09 ENCOUNTER — Ambulatory Visit (INDEPENDENT_AMBULATORY_CARE_PROVIDER_SITE_OTHER): Payer: Federal, State, Local not specified - PPO | Admitting: Internal Medicine

## 2014-09-09 ENCOUNTER — Encounter: Payer: Self-pay | Admitting: Internal Medicine

## 2014-09-09 VITALS — BP 112/74 | HR 66 | Temp 98.3°F | Wt 147.0 lb

## 2014-09-09 DIAGNOSIS — S161XXD Strain of muscle, fascia and tendon at neck level, subsequent encounter: Secondary | ICD-10-CM

## 2014-09-09 DIAGNOSIS — M545 Low back pain, unspecified: Secondary | ICD-10-CM

## 2014-09-09 DIAGNOSIS — M79641 Pain in right hand: Secondary | ICD-10-CM

## 2014-09-09 MED ORDER — IBUPROFEN 600 MG PO TABS: ORAL_TABLET | ORAL | Status: DC

## 2014-09-09 MED ORDER — OMEPRAZOLE 40 MG PO CPDR: DELAYED_RELEASE_CAPSULE | ORAL | Status: DC

## 2014-09-09 NOTE — Progress Notes (Signed)
Pre visit review using our clinic review tool, if applicable. No additional management support is needed unless otherwise documented below in the visit note. 

## 2014-09-09 NOTE — Assessment & Plan Note (Signed)
Overall better In PT
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
## 2014-09-09 NOTE — Assessment & Plan Note (Signed)
R w/paresthesia R/o CTS. Discussed Splint
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
## 2014-09-09 NOTE — Assessment & Plan Note (Signed)
Continue with current prescription therapy as reflected on the Med list.  

## 2014-09-17 ENCOUNTER — Ambulatory Visit (INDEPENDENT_AMBULATORY_CARE_PROVIDER_SITE_OTHER)
Admission: RE | Admit: 2014-09-17 | Discharge: 2014-09-17 | Disposition: A | Discharge status: Home / Self Care | Payer: Federal, State, Local not specified - PPO | Source: Ambulatory Visit | Attending: Family Medicine | Admitting: Family Medicine

## 2014-09-17 ENCOUNTER — Encounter: Payer: Self-pay | Admitting: Family Medicine

## 2014-09-17 ENCOUNTER — Ambulatory Visit (INDEPENDENT_AMBULATORY_CARE_PROVIDER_SITE_OTHER): Payer: Federal, State, Local not specified - PPO | Admitting: Family Medicine

## 2014-09-17 VITALS — BP 112/80 | HR 62 | Ht 70.0 in | Wt 147.0 lb

## 2014-09-17 DIAGNOSIS — M25562 Pain in left knee: Secondary | ICD-10-CM

## 2014-09-17 DIAGNOSIS — M5416 Radiculopathy, lumbar region: Secondary | ICD-10-CM

## 2014-09-17 DIAGNOSIS — M545 Low back pain, unspecified: Secondary | ICD-10-CM

## 2014-09-17 DIAGNOSIS — M25561 Pain in right knee: Secondary | ICD-10-CM

## 2014-09-17 MED ORDER — DICLOFENAC SODIUM 2 % TD SOLN: TRANSDERMAL | Status: DC

## 2014-09-17 MED ORDER — GABAPENTIN 100 MG PO CAPS: ORAL_CAPSULE | ORAL | Status: DC

## 2014-09-17 NOTE — Patient Instructions (Addendum)
Good to see you Jorge Turner is your friend.  Continue physical therapy Xray of your back today.  Gabapentin nightly for next week, then increase to 2 pills nghtly second week and 3 pills nightly thereafter.  Pennsaid twice daily Turmeric 500mg  twice daily.  Come back in about 1 week and we will see how you are doing. At that time if knees and back are still hurting we will consider advance imaging.
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
## 2014-09-17 NOTE — Assessment & Plan Note (Signed)
Patient states that the pain is worse with going upstairs. Patient's does go exam findings are consistent with somewhat of a patellofemoral syndrome. Patient does have some weakness over all but this could also be from his back. Patient's x-ray show no bony abnormality some no significant osteoarthritic changes. It could be degenerative changes of the meniscus and only advance imaging would show this. Patient was given some mild home exercises and will try topical anti-inflammatories as well as bracing. When patient come back in one week for followup of his back x-ray and pain if he does not make any significant improvement I would like patient to get an MRI of his knees to further evaluate. He is normal at that time and there is nothing else that can be done. Patient has not tried a corticosteroid injection in this could be beneficial in the future.

## 2014-09-24 ENCOUNTER — Ambulatory Visit (INDEPENDENT_AMBULATORY_CARE_PROVIDER_SITE_OTHER): Payer: Federal, State, Local not specified - PPO | Admitting: Family Medicine

## 2014-09-24 ENCOUNTER — Encounter: Payer: Self-pay | Admitting: Family Medicine

## 2014-09-24 ENCOUNTER — Other Ambulatory Visit: Payer: Self-pay | Admitting: Family Medicine

## 2014-09-24 VITALS — BP 110/78 | HR 57 | Ht 70.0 in | Wt 150.0 lb

## 2014-09-24 DIAGNOSIS — M545 Low back pain, unspecified: Secondary | ICD-10-CM

## 2014-09-24 DIAGNOSIS — M5416 Radiculopathy, lumbar region: Secondary | ICD-10-CM

## 2014-09-24 DIAGNOSIS — M25562 Pain in left knee: Secondary | ICD-10-CM | POA: Insufficient documentation
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
## 2014-09-24 NOTE — Patient Instructions (Signed)
Good to see you Jorge Turner is still your friend Xrays are normal but with your pain I feel we should check an MRI, they will call you.  Conitnue the medicines See me 1-2 days after MRI and we will discuss results and what to do next.

## 2014-09-24 NOTE — Assessment & Plan Note (Signed)
Patient of left knee pain overall. Patient does have some internal derangement type symptoms that could be contributing. With patient's pain seeming to be out of proportion from his exam as well as x-ray findings I would like to get an MRI. MRI was ordered today. This will rule out any type of meniscal injury that could be contributing. If patient's imaging is normal I do not think we are going to be able to help patient's any may need to be seen by chronic pain type physicians. Once again declined doing any disability paperwork today.

## 2014-09-24 NOTE — Assessment & Plan Note (Signed)
Patient continues to have significant low back pain. X-rays were fairly unremarkable. Due to the chronicity of his back pain as well as the amount it is affecting his daily activities I do feel that further imaging is necessary. MRI is ordered today. Patient's knee pain could also be radicular symptoms from his back. Patient does not have any weakness at this time but does have chronic radicular symptoms. We will continue to work with conservative therapy as well as discussed with patient that I do think physical therapy will be helpful. Encourage patient to get the over-the-counter medications. Patient will come back within a few days after the MRI and we'll discuss findings.  Spent greater than 25 minutes with patient face-to-face and had greater than 50% of counseling including as described above in assessment and plan.

## 2014-09-27 DIAGNOSIS — Z0279 Encounter for issue of other medical certificate: Secondary | ICD-10-CM

## 2014-10-03 ENCOUNTER — Ambulatory Visit
Admission: RE | Admit: 2014-10-03 | Discharge: 2014-10-03 | Disposition: A | Discharge status: Home / Self Care | Payer: Federal, State, Local not specified - PPO | Source: Ambulatory Visit | Attending: Family Medicine | Admitting: Family Medicine

## 2014-10-03 DIAGNOSIS — M25562 Pain in left knee: Secondary | ICD-10-CM

## 2014-10-03 DIAGNOSIS — M5416 Radiculopathy, lumbar region: Secondary | ICD-10-CM

## 2014-10-14 ENCOUNTER — Telehealth: Payer: Self-pay | Admitting: Internal Medicine

## 2014-10-14 ENCOUNTER — Other Ambulatory Visit: Payer: Self-pay | Admitting: Geriatric Medicine

## 2014-10-14 NOTE — Telephone Encounter (Signed)
States patient is out of amlodipine.  She is establishing care/transfering from [facility] on 1/7.  Pharmacy is requesting script to be sent over.

## 2014-10-15 ENCOUNTER — Ambulatory Visit (INDEPENDENT_AMBULATORY_CARE_PROVIDER_SITE_OTHER): Payer: Federal, State, Local not specified - PPO | Admitting: Family Medicine

## 2014-10-15 ENCOUNTER — Other Ambulatory Visit: Payer: Self-pay | Admitting: *Deleted

## 2014-10-15 VITALS — BP 102/68 | HR 64 | Ht 70.0 in | Wt 151.0 lb

## 2014-10-15 DIAGNOSIS — M25562 Pain in left knee: Secondary | ICD-10-CM

## 2014-10-15 DIAGNOSIS — M545 Low back pain, unspecified: Secondary | ICD-10-CM

## 2014-10-15 DIAGNOSIS — S83249A Other tear of medial meniscus, current injury, unspecified knee, initial encounter: Secondary | ICD-10-CM | POA: Insufficient documentation

## 2014-10-15 DIAGNOSIS — S83242A Other tear of medial meniscus, current injury, left knee, initial encounter: Secondary | ICD-10-CM

## 2014-10-15 MED ORDER — GABAPENTIN 100 MG PO CAPS: ORAL_CAPSULE | ORAL | Status: DC

## 2014-10-15 NOTE — Telephone Encounter (Signed)
Refill done.  

## 2014-10-15 NOTE — Assessment & Plan Note (Signed)
Patient does have a medial meniscal tear. Patient has failed all other conservative therapy including formal physical therapy, icing protocol, and bracing as well as critical steroid injection.  Patient is going to be referred for surgical intervention on the posterior meniscal tear.  Spent greater than 25 minutes with patient face-to-face and had greater than 50% of counseling including as described above in assessment and plan.

## 2014-10-15 NOTE — Assessment & Plan Note (Signed)
Patient back pain is likely secondary to more viable multifactorial approach. Patient does have L4-L5 mild narrowing noted of the spinal canal on MRI. Discussed with patient that an epidural steroid injection would be diagnostic as well as potentially therapeutic if this is giving him difficulty. Patient has elected to try this. If patient does not make any improvement we need to consider more of a chronic pain syndrome that is contributing to his pain as well as muscle imbalances. Patient will come back 2 weeks after the epidural steroid injection and we will discuss further treatment options. Patient will continue the home exercises as well as the current medications.
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
## 2014-10-15 NOTE — Patient Instructions (Addendum)
Good to see you For your back we will try the epidural.  Continue the B12  B6 200mg   Daily.  For your knee will send you to Dr. Rhona Raider, Dr. Berenice Primas.  See me 2 weeks after injection and we will discuss.  Continue everything.

## 2014-10-16 ENCOUNTER — Other Ambulatory Visit: Payer: Self-pay | Admitting: Geriatric Medicine

## 2014-10-16 NOTE — Telephone Encounter (Signed)
This is one of Plotnikov's patients. I was going to refill it, but I did not see amlodipine on his med list.

## 2014-11-25 ENCOUNTER — Other Ambulatory Visit: Payer: Self-pay | Admitting: Internal Medicine

## 2014-11-27 NOTE — Telephone Encounter (Signed)
Called [facility] spoke with Adonis Huguenin gave md approval.../lmb

## 2014-12-10 ENCOUNTER — Ambulatory Visit (INDEPENDENT_AMBULATORY_CARE_PROVIDER_SITE_OTHER): Payer: Federal, State, Local not specified - PPO | Admitting: Internal Medicine

## 2014-12-10 ENCOUNTER — Encounter: Payer: Self-pay | Admitting: Internal Medicine

## 2014-12-10 VITALS — BP 100/72 | HR 72 | Temp 98.1°F | Wt 151.0 lb

## 2014-12-10 DIAGNOSIS — M545 Low back pain, unspecified: Secondary | ICD-10-CM

## 2014-12-10 DIAGNOSIS — M542 Cervicalgia: Secondary | ICD-10-CM

## 2014-12-10 DIAGNOSIS — M25562 Pain in left knee: Secondary | ICD-10-CM

## 2014-12-10 NOTE — Assessment & Plan Note (Signed)
Better In PT
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
## 2014-12-10 NOTE — Assessment & Plan Note (Signed)
In PT  Now has R hand paresthesia Will re-assess later - L knee surgery pending next wk - Dr Rhona Raider

## 2014-12-10 NOTE — Progress Notes (Signed)
Pre visit review using our clinic review tool, if applicable. No additional management support is needed unless otherwise documented below in the visit note. 

## 2014-12-10 NOTE — Assessment & Plan Note (Signed)
-   surgery pending next wk - Dr Rhona Raider

## 2015-02-13 ENCOUNTER — Encounter: Payer: Self-pay | Admitting: Internal Medicine

## 2015-02-13 ENCOUNTER — Ambulatory Visit (INDEPENDENT_AMBULATORY_CARE_PROVIDER_SITE_OTHER): Payer: Federal, State, Local not specified - PPO | Admitting: Internal Medicine

## 2015-02-13 VITALS — BP 108/82 | HR 56 | Wt 157.0 lb

## 2015-02-13 DIAGNOSIS — M545 Low back pain, unspecified: Secondary | ICD-10-CM

## 2015-02-13 DIAGNOSIS — E538 Deficiency of other specified B group vitamins: Secondary | ICD-10-CM

## 2015-02-13 DIAGNOSIS — M25562 Pain in left knee: Secondary | ICD-10-CM

## 2015-02-13 MED ORDER — TRAMADOL HCL 50 MG PO TABS: 50.0000 mg | ORAL_TABLET | Freq: Four times a day (QID) | ORAL | Status: DC | PRN

## 2015-02-13 MED ORDER — IBUPROFEN 600 MG PO TABS: 600.0000 mg | ORAL_TABLET | Freq: Two times a day (BID) | ORAL | Status: DC

## 2015-02-13 MED ORDER — OMEPRAZOLE 40 MG PO CPDR: 40.0000 mg | DELAYED_RELEASE_CAPSULE | Freq: Every day | ORAL | Status: DC

## 2015-02-13 NOTE — Progress Notes (Signed)
Pre visit review using our clinic review tool, if applicable. No additional management support is needed unless otherwise documented below in the visit note. 

## 2015-02-13 NOTE — Assessment & Plan Note (Signed)
Better  

## 2015-02-13 NOTE — Assessment & Plan Note (Signed)
s/p surgery 12/19/14 - Dr Rhona Raider - better
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
## 2015-02-13 NOTE — Assessment & Plan Note (Signed)
Vit B12 po

## 2015-02-17 ENCOUNTER — Encounter: Payer: Self-pay | Admitting: Family Medicine

## 2015-02-17 ENCOUNTER — Ambulatory Visit (INDEPENDENT_AMBULATORY_CARE_PROVIDER_SITE_OTHER): Payer: Federal, State, Local not specified - PPO | Admitting: Family Medicine

## 2015-02-17 VITALS — BP 102/82 | HR 60 | Ht 70.0 in | Wt 154.0 lb

## 2015-02-17 DIAGNOSIS — M25562 Pain in left knee: Secondary | ICD-10-CM

## 2015-02-17 DIAGNOSIS — M545 Low back pain, unspecified: Secondary | ICD-10-CM

## 2015-02-17 NOTE — Assessment & Plan Note (Signed)
Patient is doing relatively well at this time. Patient is making some progress. Patient would like to be off the gabapentin but we discussed slowly titrating off. Patient will do this in 3 day intervals. This is a seems to help I will be fine. She does have tramadol for any breakthrough pain. We discussed icing regimen as well. We discussed avoiding any significant twisting sensations. I believe the patient is going to do well overall. Patient does have topical anti-inflammatory as well. Patient will come back again in 4 weeks for further evaluation.

## 2015-02-17 NOTE — Patient Instructions (Signed)
It is good to see you I am glad you are doing better Ice 20 minutes at night Try the duexis 3 times daily for 3 days then as needed continue the pennsaid as needed Get back to bowling in April full swing For back take gabapentin at night 3 nights in a row  In 4 weeks if right knee still hurts come on back and we will check

## 2015-02-17 NOTE — Progress Notes (Signed)
Pre visit review using our clinic review tool, if applicable. No additional management support is needed unless otherwise documented below in the visit note. 
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
## 2015-02-17 NOTE — Assessment & Plan Note (Signed)
'  s 2 months now after meniscal repair. Patient is feeling significantly better. Patient is not to start increasing his activity and patient given some exercise prescription today. Patient's will come back in 4 weeks if not completely resolved. Patient was having right knee pain we may need to further work this up if patient is not better.  Spent  25 minutes with patient face-to-face and had greater than 50% of counseling including as described above in assessment and plan.

## 2015-03-11 ENCOUNTER — Ambulatory Visit (INDEPENDENT_AMBULATORY_CARE_PROVIDER_SITE_OTHER): Payer: Federal, State, Local not specified - PPO | Admitting: Internal Medicine

## 2015-03-11 ENCOUNTER — Encounter: Payer: Self-pay | Admitting: Internal Medicine

## 2015-03-11 VITALS — BP 102/72 | HR 61 | Temp 98.5°F | Resp 13 | Ht 70.0 in | Wt 157.2 lb

## 2015-03-11 DIAGNOSIS — L089 Local infection of the skin and subcutaneous tissue, unspecified: Secondary | ICD-10-CM

## 2015-03-11 DIAGNOSIS — L720 Epidermal cyst: Secondary | ICD-10-CM

## 2015-03-11 MED ORDER — CEPHALEXIN 500 MG PO CAPS: 500.0000 mg | ORAL_CAPSULE | Freq: Two times a day (BID) | ORAL | Status: DC

## 2015-03-11 MED ORDER — MUPIROCIN 2 % EX OINT: TOPICAL_OINTMENT | CUTANEOUS | Status: DC

## 2015-03-11 NOTE — Patient Instructions (Signed)
The Surgery  referral will be scheduled and you'll be notified of the time.Please call the Referral Co-Ordinator @ [phone] if you have not been notified of appointment time within 7-10 days.

## 2015-03-11 NOTE — Progress Notes (Signed)
   Subjective:    Patient ID: Jorge Turner, male    DOB: Mar 17, 1956, 59 y.o.   MRN: [id number]  HPI Approximately 5 days ago he noted an extremely tender bump over the right upper posterior thorax. The pain was sharp and constant for 48 hours. He then noted that there was foul smelling discharge from this area with associated decrease in the pain.  In this same area he had a "cyst" removed in 2011. Apparently this was simply a lancing procedure as a minor surgery was done in this office and not in a surgical suite.  He's been applying Neosporin and hydrogen peroxide to this lesion.  Review of Systems He denies fever, chills, sweats, weight loss.  He has no associated oral lesions.  He has no genitourinary symptoms.    Objective:   Physical Exam   General appearance :adequately nourished; in no distress.  Eyes: No conjunctival inflammation or scleral icterus is present.  Oral exam:  Lips and gums are healthy appearing.There is no oropharyngeal erythema or exudate noted. Dental hygiene is good.  Heart:  Normal rate and regular rhythm. S1 and S2 normal without gallop, murmur, click, rub or other extra sounds    Lungs:Chest clear to auscultation; no wheezes, rhonchi,rales ,or rubs present.No increased work of breathing.   Abdomen: bowel sounds normal, soft and non-tender without masses, organomegaly or hernias noted.  No guarding or rebound.   Vascular : all pulses equal ; no bruits present.  Skin:Warm & dry; no tenting. He has a 2 x 2 0.5 indurated area of cellulitis with a central punctate deficit over the RU posterior thorax.  Lymphatic: No lymphadenopathy is noted about the head, neck, axilla  Neuro: Strength, tone normal.       Assessment & Plan:  #1 infected epidermoid inclusion cyst, recurrent  Plan: See orders and after visit summary.

## 2015-03-11 NOTE — Progress Notes (Signed)
Pre visit review using our clinic review tool, if applicable. No additional management support is needed unless otherwise documented below in the visit note. 

## 2015-03-17 ENCOUNTER — Ambulatory Visit: Payer: Federal, State, Local not specified - PPO | Admitting: Family Medicine

## 2015-03-17 DIAGNOSIS — Z0289 Encounter for other administrative examinations: Secondary | ICD-10-CM

## 2015-04-21 ENCOUNTER — Other Ambulatory Visit: Payer: Self-pay

## 2015-05-15 ENCOUNTER — Other Ambulatory Visit: Payer: Self-pay | Admitting: Internal Medicine

## 2015-05-16 ENCOUNTER — Ambulatory Visit (INDEPENDENT_AMBULATORY_CARE_PROVIDER_SITE_OTHER): Payer: Federal, State, Local not specified - PPO | Admitting: Internal Medicine

## 2015-05-16 ENCOUNTER — Other Ambulatory Visit (INDEPENDENT_AMBULATORY_CARE_PROVIDER_SITE_OTHER): Payer: Federal, State, Local not specified - PPO

## 2015-05-16 ENCOUNTER — Encounter: Payer: Self-pay | Admitting: Internal Medicine

## 2015-05-16 VITALS — BP 110/78 | HR 67 | Temp 98.7°F | Wt 156.0 lb

## 2015-05-16 DIAGNOSIS — E538 Deficiency of other specified B group vitamins: Secondary | ICD-10-CM

## 2015-05-16 DIAGNOSIS — R634 Abnormal weight loss: Secondary | ICD-10-CM

## 2015-05-16 DIAGNOSIS — M25562 Pain in left knee: Secondary | ICD-10-CM

## 2015-05-16 DIAGNOSIS — M542 Cervicalgia: Secondary | ICD-10-CM

## 2015-05-16 LAB — BASIC METABOLIC PANEL
BUN: 22 mg/dL (ref 6–23)
CALCIUM: 9.8 mg/dL (ref 8.4–10.5)
CO2: 27 mEq/L (ref 19–32)
Chloride: 104 mEq/L (ref 96–112)
Creatinine, Ser: 1.25 mg/dL (ref 0.40–1.50)
GFR: 76.22 mL/min (ref >60.00)
Glucose, Bld: 99 mg/dL (ref 70–99)
Potassium: 4.6 mEq/L (ref 3.5–5.1)
Sodium: 137 mEq/L (ref 135–145)

## 2015-05-16 LAB — TSH: TSH: 2.18 u[IU]/mL (ref 0.35–4.50)

## 2015-05-16 LAB — VITAMIN B12: Vitamin B-12: 806 pg/mL (ref 211–911)

## 2015-05-16 LAB — SEDIMENTATION RATE: Sed Rate: 8 mm/hr (ref 0–22)

## 2015-05-16 MED ORDER — TRAMADOL HCL 50 MG PO TABS: 50.0000 mg | ORAL_TABLET | Freq: Four times a day (QID) | ORAL | Status: DC | PRN

## 2015-05-16 NOTE — Assessment & Plan Note (Signed)
Neck pain and paresthesias are worse on the [facility] C[facility] spine MRI

## 2015-05-16 NOTE — Assessment & Plan Note (Signed)
s/p surgery 12/19/14 - Dr Rhona Raider
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
## 2015-05-16 NOTE — Assessment & Plan Note (Signed)
Labs Cont

## 2015-05-16 NOTE — Assessment & Plan Note (Signed)
Wt Readings from Last 3 Encounters:  05/16/15 156 lb (70.761 kg)  03/11/15 157 lb 4 oz (71.328 kg)  02/17/15 154 lb (69.854 kg)

## 2015-05-30 ENCOUNTER — Ambulatory Visit
Admission: RE | Admit: 2015-05-30 | Discharge: 2015-05-30 | Disposition: A | Discharge status: Home / Self Care | Payer: Federal, State, Local not specified - PPO | Source: Ambulatory Visit | Attending: Internal Medicine | Admitting: Internal Medicine

## 2015-05-30 DIAGNOSIS — M542 Cervicalgia: Secondary | ICD-10-CM

## 2015-06-03 ENCOUNTER — Telehealth: Payer: Self-pay | Admitting: Internal Medicine

## 2015-06-03 DIAGNOSIS — M5010 Cervical disc disorder with radiculopathy, unspecified cervical region: Secondary | ICD-10-CM

## 2015-06-03 NOTE — Telephone Encounter (Signed)
Please call patient regarding MRI results

## 2015-06-03 NOTE — Telephone Encounter (Signed)
Pt informed - please enter referral for neurosurgeon.    Entered by [name], MD at 05/30/2015 10:13 PM    Dear Nicole Kindred,  Your MRI shows some progression of the degenerative changes in the spinal canal causing more narrowing of the space around your cervical spinal cord. It may be responsible for your symptoms. We can have you see a neurosurgeon for a consultation. It is not urgent. Let me know. Happy 4th!  Sincerely,  Walker Kehr, MD

## 2015-06-03 NOTE — Telephone Encounter (Signed)
OK. Thx

## 2015-10-16 ENCOUNTER — Ambulatory Visit (INDEPENDENT_AMBULATORY_CARE_PROVIDER_SITE_OTHER): Payer: Federal, State, Local not specified - PPO | Admitting: Internal Medicine

## 2015-10-16 ENCOUNTER — Encounter: Payer: Self-pay | Admitting: Internal Medicine

## 2015-10-16 VITALS — BP 110/72 | HR 56 | Wt 168.0 lb

## 2015-10-16 DIAGNOSIS — E538 Deficiency of other specified B group vitamins: Secondary | ICD-10-CM

## 2015-10-16 MED ORDER — TRAMADOL HCL 50 MG PO TABS: 50.0000 mg | ORAL_TABLET | Freq: Four times a day (QID) | ORAL | Status: DC | PRN

## 2015-10-16 MED ORDER — TRIAMCINOLONE ACETONIDE 0.5 % EX OINT: 1.0000 "application " | TOPICAL_OINTMENT | Freq: Four times a day (QID) | CUTANEOUS | Status: DC

## 2015-10-16 MED ORDER — GABAPENTIN 100 MG PO CAPS: 100.0000 mg | ORAL_CAPSULE | Freq: Three times a day (TID) | ORAL | Status: DC | PRN

## 2015-10-16 MED ORDER — HYDROXYZINE HCL 25 MG PO TABS: 25.0000 mg | ORAL_TABLET | Freq: Every evening | ORAL | Status: DC | PRN

## 2015-10-16 NOTE — Progress Notes (Signed)
Pre visit review using our clinic review tool, if applicable. No additional management support is needed unless otherwise documented below in the visit note. 

## 2015-10-16 NOTE — Assessment & Plan Note (Signed)
On Vit B12 

## 2015-10-16 NOTE — Progress Notes (Signed)
Subjective:  Patient ID: Jorge Turner, male    DOB: Sep 16, 1956  Age: 59 y.o. MRN: [id number]  CC: No chief complaint on file.   HPI Jorge Turner presents for rash on L ankle x 2-3 days. F/u neck pain. Pt had   Outpatient Prescriptions Prior to Visit  Medication Sig Dispense Refill  . cholecalciferol (VITAMIN D) 1000 UNITS tablet Take 1,000 Units by mouth daily.      . Cyanocobalamin (VITAMIN B-12) 1000 MCG SUBL Place 1 tablet (1,000 mcg total) under the tongue daily. 100 tablet 3  . Diclofenac Sodium 2 % SOLN Apply twice daily. 112 g 3  . ibuprofen (ADVIL,MOTRIN) 600 MG tablet TAKE 1 TABLET (600 MG TOTAL) BY MOUTH 2 (TWO) TIMES DAILY. 180 tablet 2  . ketoconazole (NIZORAL) 2 % cream Apply topically as needed.    . mupirocin ointment (BACTROBAN) 2 % Applied twice a day to the affected area;NOT into eyes. 15 g 0  . Omega-3 Fatty Acids (FISH OIL) 1200 MG CAPS Take 1 capsule by mouth daily.      Marland Kitchen omeprazole (PRILOSEC) 40 MG capsule Take 1 capsule (40 mg total) by mouth daily. 90 capsule 3  . valACYclovir (VALTREX) 1000 MG tablet Take 2t po q12h times 2 doses. Repeat with next fever blister episode. 20 tablet 0  . cephALEXin (KEFLEX) 500 MG capsule Take 1 capsule (500 mg total) by mouth 2 (two) times daily. 14 capsule 0  . gabapentin (NEURONTIN) 100 MG capsule 1 pill nightly first week, 2 pills second week and 3 pills nightly thereafter. 270 capsule 0  . hydrocortisone (ANUSOL-HC) 2.5 % rectal cream Bid prn 30 g 1  . traMADol (ULTRAM) 50 MG tablet Take 1-2 tablets (50-100 mg total) by mouth every 6 (six) hours as needed. 60 tablet 1   No facility-administered medications prior to visit.    ROS  Review of Systems  Constitutional: Negative for appetite change, fatigue and unexpected weight change.  HENT: Negative for congestion, nosebleeds, sneezing, sore throat and trouble swallowing.   Eyes: Negative for itching and visual disturbance.  Respiratory: Negative for cough.     Cardiovascular: Negative for chest pain, palpitations and leg swelling.  Gastrointestinal: Negative for nausea, diarrhea, blood in stool and abdominal distention.  Genitourinary: Negative for frequency and hematuria.  Musculoskeletal: Positive for back pain and arthralgias. Negative for joint swelling, gait problem and neck pain.  Skin: Negative for rash.  Neurological: Negative for dizziness, tremors, speech difficulty and weakness.  Psychiatric/Behavioral: Negative for suicidal ideas, sleep disturbance, dysphoric mood and agitation. The patient is not nervous/anxious.    L ankle w/eryth papules  Objective:  BP 110/72 mmHg  Pulse 56  Wt 168 lb (76.204 kg)  SpO2 97%  BP Readings from Last 3 Encounters:  10/16/15 110/72  05/16/15 110/78  03/11/15 102/72    Wt Readings from Last 3 Encounters:  10/16/15 168 lb (76.204 kg)  05/30/15 156 lb (70.761 kg)  05/16/15 156 lb (70.761 kg)    Physical Exam  Constitutional: He is oriented to person, place, and time. He appears well-developed. No distress.  NAD  HENT:  Mouth/Throat: Oropharynx is clear and moist.  Eyes: Conjunctivae are normal. Pupils are equal, round, and reactive to light.  Neck: Normal range of motion. No JVD present. No thyromegaly present.  Cardiovascular: Normal rate, regular rhythm, normal heart sounds and intact distal pulses.  Exam reveals no gallop and no friction rub.   No murmur heard. Pulmonary/Chest: Effort normal and  breath sounds normal. No respiratory distress. He has no wheezes. He has no rales. He exhibits no tenderness.  Abdominal: Soft. Bowel sounds are normal. He exhibits no distension and no mass. There is tenderness. There is no rebound and no guarding.  Musculoskeletal: Normal range of motion. He exhibits no edema or tenderness.  Lymphadenopathy:    He has no cervical adenopathy.  Neurological: He is alert and oriented to person, place, and time. He has normal reflexes. No cranial nerve deficit.  He exhibits normal muscle tone. He displays a negative Romberg sign. Coordination and gait normal.  Skin: Skin is warm and dry. No rash noted.  Psychiatric: He has a normal mood and affect. His behavior is normal. Judgment and thought content normal.  LS is tender  Lab Results  Component Value Date   WBC 3.0* 06/11/2013   HGB 14.2 07/20/2013   HCT 41.7 06/11/2013   PLT 240.0 06/11/2013   GLUCOSE 99 05/16/2015   CHOL 179 06/11/2013   TRIG 39.0 06/11/2013   HDL 58.80 06/11/2013   LDLDIRECT 140.3 10/26/2011   LDLCALC 112* 06/11/2013   ALT 15 06/11/2013   AST 16 06/11/2013   NA 137 05/16/2015   K 4.6 05/16/2015   CL 104 05/16/2015   CREATININE 1.25 05/16/2015   BUN 22 05/16/2015   CO2 27 05/16/2015   TSH 2.18 05/16/2015   PSA 10.02* 06/11/2013    Mr Cervical Spine Wo Contrast  05/30/2015  CLINICAL DATA:  Worsening right side neck and right shoulder pain for 3 months with tingling in the right fingers and medial right wrist. Subsequent encounter EXAM: MRI CERVICAL SPINE WITHOUT CONTRAST TECHNIQUE: Multiplanar, multisequence MR imaging of the cervical spine was performed. No intravenous contrast was administered. COMPARISON:  Plain film cervical spine 03/11/2014. MRI cervical spine 03/20/2004. FINDINGS: Vertebral body height and alignment are maintained. There is some degenerative endplate signal change at C5-6. Somewhat attenuated appearance of the tip of the dens is unchanged compared to the prior MRI. The craniocervical junction is normal and cervical cord signal is normal. Imaged paraspinous structures are unremarkable. C2-3:  Negative. C3-4: Shallow disc bulge without central canal or foraminal narrowing. C4-5: Disc osteophyte complex slightly deforms the ventral cord. Uncovertebral disease causes moderate bilateral foraminal narrowing. Spondylosis shows progression since the 2005 MRI. C5-6: There is a disc osteophyte complex and some uncovertebral disease. The ventral cord is mildly  deformed by disc. Moderate bilateral foraminal narrowing is seen. Spondylosis shows progression since the 2005 exam. C6-7: Minimal disc bulge and mild uncovertebral disease. The central canal and foramina appear open. C7-T1: No disc bulge or protrusion. The central canal and foramina appear open. IMPRESSION: Some progression of spondylosis at C4-5 and C5-6 where there is mild deformity of the ventral cord and moderate bilateral foraminal narrowing at each level. Electronically Signed   By: Inge Rise M.D.   On: 05/30/2015 16:18    Assessment & Plan:   Diagnoses and all orders for this visit:  B12 deficiency  Other orders -     gabapentin (NEURONTIN) 100 MG capsule; Take 1 capsule (100 mg total) by mouth 3 (three) times daily as needed. -     traMADol (ULTRAM) 50 MG tablet; Take 1-2 tablets (50-100 mg total) by mouth every 6 (six) hours as needed. -     triamcinolone ointment (KENALOG) 0.5 %; Apply 1 application topically 4 (four) times daily. -     hydrOXYzine (ATARAX/VISTARIL) 25 MG tablet; Take 1-2 tablets (25-50 mg total) by mouth  at bedtime as needed for itching.  I have discontinued Mr. Penagos's cephALEXin. I have also changed his gabapentin. Additionally, I am having him start on triamcinolone ointment and hydrOXYzine. Lastly, I am having him maintain his Fish Oil, cholecalciferol, Vitamin B-12, ketoconazole, valACYclovir, Diclofenac Sodium, omeprazole, mupirocin ointment, ibuprofen, and traMADol.  Meds ordered this encounter  Medications  . gabapentin (NEURONTIN) 100 MG capsule    Sig: Take 1 capsule (100 mg total) by mouth 3 (three) times daily as needed.    Dispense:  270 capsule    Refill:  1  . traMADol (ULTRAM) 50 MG tablet    Sig: Take 1-2 tablets (50-100 mg total) by mouth every 6 (six) hours as needed.    Dispense:  60 tablet    Refill:  1  . triamcinolone ointment (KENALOG) 0.5 %    Sig: Apply 1 application topically 4 (four) times daily.    Dispense:  45 g     Refill:  1  . hydrOXYzine (ATARAX/VISTARIL) 25 MG tablet    Sig: Take 1-2 tablets (25-50 mg total) by mouth at bedtime as needed for itching.    Dispense:  60 tablet    Refill:  0     Follow-up: Return in about 3 months (around 01/16/2016) for a follow-up visit.  Walker Kehr, MD

## 2015-10-19 ENCOUNTER — Other Ambulatory Visit: Payer: Self-pay | Admitting: Internal Medicine

## 2015-10-26 ENCOUNTER — Encounter: Payer: Self-pay | Admitting: Internal Medicine

## 2015-10-31 ENCOUNTER — Ambulatory Visit (INDEPENDENT_AMBULATORY_CARE_PROVIDER_SITE_OTHER): Payer: Federal, State, Local not specified - PPO

## 2015-10-31 ENCOUNTER — Telehealth: Payer: Self-pay

## 2015-10-31 DIAGNOSIS — Z23 Encounter for immunization: Secondary | ICD-10-CM

## 2015-10-31 NOTE — Telephone Encounter (Signed)
Patient will be calling back to give [facility] name of medication he needs refilled----please get name of med and dosage----was prescribed by dr Tamala Julian according to patient

## 2015-11-15 ENCOUNTER — Other Ambulatory Visit: Payer: Self-pay | Admitting: Internal Medicine

## 2015-11-17 NOTE — Telephone Encounter (Signed)
Erroneous

## 2015-12-22 ENCOUNTER — Other Ambulatory Visit: Payer: Self-pay | Admitting: Internal Medicine

## 2015-12-23 ENCOUNTER — Telehealth: Payer: Self-pay | Admitting: *Deleted

## 2015-12-23 MED ORDER — DICLOFENAC SODIUM 2 % TD SOLN: TRANSDERMAL | Status: DC

## 2015-12-23 NOTE — Telephone Encounter (Signed)
Sent rx to special pharmacy they should be calling him shortly.

## 2015-12-23 NOTE — Telephone Encounter (Signed)
Received call pt is requesting refill on pennsaid...Jorge Turner

## 2016-01-05 ENCOUNTER — Ambulatory Visit (INDEPENDENT_AMBULATORY_CARE_PROVIDER_SITE_OTHER): Payer: Federal, State, Local not specified - PPO | Admitting: Family

## 2016-01-05 ENCOUNTER — Encounter: Payer: Self-pay | Admitting: Family

## 2016-01-05 VITALS — BP 128/88 | HR 64 | Temp 97.8°F | Resp 16 | Ht 70.0 in | Wt 166.0 lb

## 2016-01-05 DIAGNOSIS — M659 Synovitis and tenosynovitis, unspecified: Secondary | ICD-10-CM

## 2016-01-05 NOTE — Assessment & Plan Note (Signed)
Symptoms and exam consistent with CMC synovitis. Treat conservatively at this time with ice and OTC medications as needed for symptom relief. Recommend thumb spica splint and continued use of previously prescribed Pennsaid. If symptoms worsen or do not improve consider imaging and possible corticosteroid injection.

## 2016-01-05 NOTE — Progress Notes (Signed)
Subjective:    Patient ID: Jorge Turner, male    DOB: May 11, 1956, 60 y.o.   MRN: [id number]  Chief Complaint  Patient presents with  . Hand Pain    x2 weeks, has a throbbing and aching pain in his right thumb that starts at the bottom    HPI:  Jorge Turner is a 60 y.o. male who  has a past medical history of Personal history of colonic polyps (03/03/2010); BPH (benign prostatic hypertrophy); GERD (gastroesophageal reflux disease); Eczema; Vitamin B12 deficiency; Hemorrhoid; Balanitis; DJD (degenerative joint disease) of knee; and Low back pain. and presents today for an acute office visit.   This is a new problem. Associated symptom of pain located in his right thumb has been going on for about 2 weeks and is described as aching and throbbing. Denies trauma. He is right hand dominant. Modifying factors include Pennsaid which does help slightly. Works as an Insurance underwriter. Does not text a lot.    Allergies  Allergen Reactions  . Erythromycin Ethylsuccinate Anaphylaxis  . Doxycycline Nausea And Vomiting     Current Outpatient Prescriptions on File Prior to Visit  Medication Sig Dispense Refill  . ANUSOL-HC 2.5 % rectal cream APPLY TO AFFECTED AREA TWICE A DAY AS NEEDED 30 g 0  . cholecalciferol (VITAMIN D) 1000 UNITS tablet Take 1,000 Units by mouth daily.      . Cyanocobalamin (VITAMIN B-12) 1000 MCG SUBL Place 1 tablet (1,000 mcg total) under the tongue daily. 100 tablet 3  . Diclofenac Sodium 2 % SOLN Apply twice daily. 112 g 3  . gabapentin (NEURONTIN) 100 MG capsule Take 1 capsule (100 mg total) by mouth 3 (three) times daily as needed. 270 capsule 1  . hydrOXYzine (ATARAX/VISTARIL) 25 MG tablet TAKE 1-2 TABLETS (25-50 MG TOTAL) BY MOUTH AT BEDTIME AS NEEDED FOR ITCHING. 60 tablet 0  . ibuprofen (ADVIL,MOTRIN) 600 MG tablet TAKE 1 TABLET (600 MG TOTAL) BY MOUTH 2 (TWO) TIMES DAILY. 180 tablet 2  . ketoconazole (NIZORAL) 2 % cream Apply topically as needed.    . mupirocin  ointment (BACTROBAN) 2 % Applied twice a day to the affected area;NOT into eyes. 15 g 0  . Omega-3 Fatty Acids (FISH OIL) 1200 MG CAPS Take 1 capsule by mouth daily.      Marland Kitchen omeprazole (PRILOSEC) 40 MG capsule Take 1 capsule (40 mg total) by mouth daily. 90 capsule 3  . traMADol (ULTRAM) 50 MG tablet Take 1-2 tablets (50-100 mg total) by mouth every 6 (six) hours as needed. 60 tablet 1  . triamcinolone ointment (KENALOG) 0.5 % Apply 1 application topically 4 (four) times daily. 45 g 1  . valACYclovir (VALTREX) 1000 MG tablet Take 2t po q12h times 2 doses. Repeat with next fever blister episode. 20 tablet 0   No current facility-administered medications on file prior to visit.     Past Surgical History  Procedure Laterality Date  . Cysto removal right thumb  2006  . Right hydrocelectomy  11-13-2010  . Circumcision N/A 07/20/2013    Procedure: CIRCUMCISION ADULT;  Surgeon: Claybon Jabs, MD;  Location: [facility];  Service: Urology;  Laterality: N/A;      Review of Systems  Musculoskeletal:       Positive for right thumb pain.   Neurological: Negative for weakness and numbness.      Objective:    BP 128/88 mmHg  Pulse 64  Temp(Src) 97.8 F (36.6 C) (Oral)  Resp 16  Ht 5\' 10"  (1.778 m)  Wt 166 lb (75.297 kg)  BMI 23.82 kg/m2  SpO2 98% Nursing note and vital signs reviewed.  Physical Exam  Constitutional: He is oriented to person, place, and time. He appears well-developed and well-nourished. No distress.  Cardiovascular: Normal rate, regular rhythm, normal heart sounds and intact distal pulses.   Pulmonary/Chest: Effort normal and breath sounds normal.  Musculoskeletal:  Right thumb - mild edema with no obvious deformity or discoloration. Increased temperature noted. Range of motion is normal. There is tenderness of the CMC joint. Negative Finkelstein's. Pulses and capillary refill are intact and appropriate.  Neurological: He is alert and oriented to person,  place, and time.  Skin: Skin is warm and dry.  Psychiatric: He has a normal mood and affect. His behavior is normal. Judgment and thought content normal.       Assessment & Plan:   Problem List Items Addressed This Visit      Musculoskeletal and Integument   CMC (carpometacarpal) synovitis - Primary    Symptoms and exam consistent with CMC synovitis. Treat conservatively at this time with ice and OTC medications as needed for symptom relief. Recommend thumb spica splint and continued use of previously prescribed Pennsaid. If symptoms worsen or do not improve consider imaging and possible corticosteroid injection.

## 2016-01-05 NOTE — Patient Instructions (Signed)
Thank you for choosing [facility].  Summary/Instructions:  Ice multiple throughout the day.   Thumb spica splint  Continue Pennsaid  If your symptoms worsen or fail to improve, please contact our office for further instruction, or in case of emergency go directly to the emergency room at the closest medical facility.

## 2016-01-05 NOTE — Progress Notes (Signed)
Pre visit review using our clinic review tool, if applicable. No additional management support is needed unless otherwise documented below in the visit note. 

## 2016-01-19 ENCOUNTER — Ambulatory Visit: Payer: Federal, State, Local not specified - PPO | Admitting: Internal Medicine

## 2016-01-20 ENCOUNTER — Encounter: Payer: Self-pay | Admitting: Internal Medicine

## 2016-01-20 ENCOUNTER — Ambulatory Visit (INDEPENDENT_AMBULATORY_CARE_PROVIDER_SITE_OTHER): Payer: Federal, State, Local not specified - PPO | Admitting: Internal Medicine

## 2016-01-20 VITALS — BP 112/80 | HR 65 | Wt 165.0 lb

## 2016-01-20 DIAGNOSIS — G8929 Other chronic pain: Secondary | ICD-10-CM | POA: Diagnosis not present

## 2016-01-20 DIAGNOSIS — M542 Cervicalgia: Secondary | ICD-10-CM | POA: Diagnosis not present

## 2016-01-20 DIAGNOSIS — R202 Paresthesia of skin: Secondary | ICD-10-CM

## 2016-01-20 DIAGNOSIS — M545 Low back pain: Secondary | ICD-10-CM | POA: Diagnosis not present

## 2016-01-20 MED ORDER — TRIAMCINOLONE ACETONIDE 0.5 % EX OINT: 1.0000 "application " | TOPICAL_OINTMENT | Freq: Two times a day (BID) | CUTANEOUS | Status: DC

## 2016-01-20 MED ORDER — TRAMADOL HCL 50 MG PO TABS: 50.0000 mg | ORAL_TABLET | Freq: Four times a day (QID) | ORAL | Status: DC | PRN

## 2016-01-20 MED ORDER — IBUPROFEN 600 MG PO TABS: ORAL_TABLET | ORAL | Status: DC

## 2016-01-20 NOTE — Progress Notes (Signed)
Pre visit review using our clinic review tool, if applicable. No additional management support is needed unless otherwise documented below in the visit note. 
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
## 2016-01-20 NOTE — Assessment & Plan Note (Signed)
In PT Tramadol  Potential benefits of a long term opioids use as well as potential risks (i.e. addiction risk, apnea etc) and complications (i.e. Somnolence, constipation and others) were explained to the patient and were aknowledged.

## 2016-01-20 NOTE — Assessment & Plan Note (Signed)
2/17 R hand - ?radiculopaty Body pillow, cntour pillow Ibuprofen x10 d

## 2016-01-30 ENCOUNTER — Other Ambulatory Visit: Payer: Self-pay | Admitting: Internal Medicine

## 2016-02-12 ENCOUNTER — Other Ambulatory Visit: Payer: Self-pay | Admitting: Internal Medicine

## 2016-02-12 DIAGNOSIS — E559 Vitamin D deficiency, unspecified: Secondary | ICD-10-CM | POA: Insufficient documentation

## 2016-02-12 DIAGNOSIS — E785 Hyperlipidemia, unspecified: Secondary | ICD-10-CM | POA: Insufficient documentation

## 2016-03-03 ENCOUNTER — Other Ambulatory Visit: Payer: Self-pay | Admitting: Internal Medicine

## 2016-03-18 ENCOUNTER — Encounter: Payer: Self-pay | Admitting: Internal Medicine

## 2016-04-15 DIAGNOSIS — H43393 Other vitreous opacities, bilateral: Secondary | ICD-10-CM | POA: Diagnosis not present

## 2016-05-16 ENCOUNTER — Other Ambulatory Visit: Payer: Self-pay | Admitting: Internal Medicine

## 2016-05-17 ENCOUNTER — Ambulatory Visit (INDEPENDENT_AMBULATORY_CARE_PROVIDER_SITE_OTHER): Payer: Federal, State, Local not specified - PPO | Admitting: Internal Medicine

## 2016-05-17 ENCOUNTER — Ambulatory Visit (INDEPENDENT_AMBULATORY_CARE_PROVIDER_SITE_OTHER)
Admission: RE | Admit: 2016-05-17 | Discharge: 2016-05-17 | Disposition: A | Discharge status: Home / Self Care | Payer: Federal, State, Local not specified - PPO | Source: Ambulatory Visit | Attending: Internal Medicine | Admitting: Internal Medicine

## 2016-05-17 ENCOUNTER — Encounter: Payer: Self-pay | Admitting: Internal Medicine

## 2016-05-17 VITALS — BP 110/88 | HR 69 | Wt 163.0 lb

## 2016-05-17 DIAGNOSIS — B009 Herpesviral infection, unspecified: Secondary | ICD-10-CM

## 2016-05-17 DIAGNOSIS — G8929 Other chronic pain: Secondary | ICD-10-CM

## 2016-05-17 DIAGNOSIS — M79646 Pain in unspecified finger(s): Secondary | ICD-10-CM | POA: Insufficient documentation

## 2016-05-17 DIAGNOSIS — M79644 Pain in right finger(s): Secondary | ICD-10-CM | POA: Diagnosis not present

## 2016-05-17 DIAGNOSIS — M7989 Other specified soft tissue disorders: Secondary | ICD-10-CM | POA: Diagnosis not present

## 2016-05-17 DIAGNOSIS — E538 Deficiency of other specified B group vitamins: Secondary | ICD-10-CM

## 2016-05-17 DIAGNOSIS — M545 Low back pain: Secondary | ICD-10-CM | POA: Diagnosis not present

## 2016-05-17 DIAGNOSIS — Z0000 Encounter for general adult medical examination without abnormal findings: Secondary | ICD-10-CM

## 2016-05-17 MED ORDER — HYDROXYZINE HCL 25 MG PO TABS: ORAL_TABLET | ORAL | Status: DC

## 2016-05-17 MED ORDER — VALACYCLOVIR HCL 1 G PO TABS: ORAL_TABLET | ORAL | Status: DC

## 2016-05-17 MED ORDER — TRAMADOL HCL 50 MG PO TABS: 50.0000 mg | ORAL_TABLET | Freq: Four times a day (QID) | ORAL | Status: DC | PRN

## 2016-05-17 MED ORDER — DICLOFENAC SODIUM 2 % TD SOLN: TRANSDERMAL | Status: DC

## 2016-05-17 NOTE — Assessment & Plan Note (Addendum)
Finished PT. Better - cont w/chiropratic Rx maintenance 2/mo Tramadol prn

## 2016-05-17 NOTE — Assessment & Plan Note (Signed)
On B12 po 

## 2016-05-17 NOTE — Assessment & Plan Note (Signed)
pain in the R thumb over 1st MCP  and R wrist over abd poll longus palpation X ray  ref to Dr Tamala Julian

## 2016-05-17 NOTE — Progress Notes (Signed)
Pre visit review using our clinic review tool, if applicable. No additional management support is needed unless otherwise documented below in the visit note. 

## 2016-05-17 NOTE — Progress Notes (Signed)
Subjective:  Patient ID: Jorge Turner, male    DOB: 1956-04-20  Age: 60 y.o. MRN: [id number]  CC: No chief complaint on file.   HPI CARLESS FABRIS presents for LBP, cervical pain - better after PT and chiropractic treatments. C/o ongoing pain in the R thumb and R wrist - not better  Outpatient Prescriptions Prior to Visit  Medication Sig Dispense Refill  . ANUSOL-HC 2.5 % rectal cream APPLY TO AFFECTED AREA TWICE A DAY AS NEEDED 30 g 0  . cholecalciferol (VITAMIN D) 1000 UNITS tablet Take 1,000 Units by mouth daily.      . Cyanocobalamin (VITAMIN B-12) 1000 MCG SUBL Place 1 tablet (1,000 mcg total) under the tongue daily. 100 tablet 3  . Diclofenac Sodium 2 % SOLN Apply twice daily. 112 g 3  . gabapentin (NEURONTIN) 100 MG capsule Take 1 capsule (100 mg total) by mouth 3 (three) times daily as needed. 270 capsule 1  . hydrOXYzine (ATARAX/VISTARIL) 25 MG tablet TAKE 1-2 TABLETS (25-50 MG TOTAL) BY MOUTH AT BEDTIME AS NEEDED FOR ITCHING. 60 tablet 0  . ibuprofen (ADVIL,MOTRIN) 600 MG tablet TAKE 1 TABLET (600 MG TOTAL) BY MOUTH 2 (TWO) TIMES DAILY. 180 tablet 1  . ibuprofen (ADVIL,MOTRIN) 600 MG tablet TAKE 1 TABLET BY MOUTH TWICE A DAY 180 tablet 0  . ketoconazole (NIZORAL) 2 % cream Apply topically as needed.    . mupirocin ointment (BACTROBAN) 2 % Applied twice a day to the affected area;NOT into eyes. 15 g 0  . Omega-3 Fatty Acids (FISH OIL) 1200 MG CAPS Take 1 capsule by mouth daily.      Marland Kitchen omeprazole (PRILOSEC) 40 MG capsule TAKE 1 CAPSULE (40 MG TOTAL) BY MOUTH DAILY. 90 capsule 2  . traMADol (ULTRAM) 50 MG tablet Take 1-2 tablets (50-100 mg total) by mouth every 6 (six) hours as needed. 60 tablet 2  . triamcinolone ointment (KENALOG) 0.5 % Apply 1 application topically 2 (two) times daily. 45 g 3  . valACYclovir (VALTREX) 1000 MG tablet Take 2t po q12h times 2 doses. Repeat with next fever blister episode. 20 tablet 0   No facility-administered medications prior to visit.     ROS Review of Systems  Constitutional: Negative for appetite change, fatigue and unexpected weight change.  HENT: Negative for congestion, nosebleeds, sneezing, sore throat and trouble swallowing.   Eyes: Negative for itching and visual disturbance.  Respiratory: Negative for cough.   Cardiovascular: Negative for chest pain, palpitations and leg swelling.  Gastrointestinal: Negative for nausea, diarrhea, blood in stool and abdominal distention.  Genitourinary: Negative for frequency and hematuria.  Musculoskeletal: Positive for back pain, arthralgias and neck pain. Negative for joint swelling and gait problem.  Skin: Negative for rash.  Neurological: Negative for dizziness, tremors, speech difficulty and weakness.  Psychiatric/Behavioral: Negative for sleep disturbance, dysphoric mood and agitation. The patient is not nervous/anxious.     Objective:  BP 110/88 mmHg  Pulse 69  Wt 163 lb (73.936 kg)  SpO2 97%  BP Readings from Last 3 Encounters:  05/17/16 110/88  01/20/16 112/80  01/05/16 128/88    Wt Readings from Last 3 Encounters:  05/17/16 163 lb (73.936 kg)  01/20/16 165 lb (74.844 kg)  01/05/16 166 lb (75.297 kg)    Physical Exam  Constitutional: He is oriented to person, place, and time. He appears well-developed. No distress.  NAD  HENT:  Mouth/Throat: Oropharynx is clear and moist.  Eyes: Conjunctivae are normal. Pupils are equal, round, and  reactive to light.  Neck: Normal range of motion. No JVD present. No thyromegaly present.  Cardiovascular: Normal rate, regular rhythm, normal heart sounds and intact distal pulses.  Exam reveals no gallop and no friction rub.   No murmur heard. Pulmonary/Chest: Effort normal and breath sounds normal. No respiratory distress. He has no wheezes. He has no rales. He exhibits no tenderness.  Abdominal: Soft. Bowel sounds are normal. He exhibits no distension and no mass. There is no tenderness. There is no rebound and no  guarding.  Musculoskeletal: Normal range of motion. He exhibits tenderness. He exhibits no edema.  Lymphadenopathy:    He has no cervical adenopathy.  Neurological: He is alert and oriented to person, place, and time. He has normal reflexes. No cranial nerve deficit. He exhibits normal muscle tone. He displays a negative Romberg sign. Coordination and gait normal.  Skin: Skin is warm and dry. No rash noted.  Psychiatric: He has a normal mood and affect. His behavior is normal. Judgment and thought content normal.  pain in the R thumb over 1st MCP  and R wrist over abd poll longus palpation  Lab Results  Component Value Date   WBC 3.0* 06/11/2013   HGB 14.2 07/20/2013   HCT 41.7 06/11/2013   PLT 240.0 06/11/2013   GLUCOSE 99 05/16/2015   CHOL 179 06/11/2013   TRIG 39.0 06/11/2013   HDL 58.80 06/11/2013   LDLDIRECT 140.3 10/26/2011   LDLCALC 112* 06/11/2013   ALT 15 06/11/2013   AST 16 06/11/2013   NA 137 05/16/2015   K 4.6 05/16/2015   CL 104 05/16/2015   CREATININE 1.25 05/16/2015   BUN 22 05/16/2015   CO2 27 05/16/2015   TSH 2.18 05/16/2015   PSA 10.02* 06/11/2013    Mr Cervical Spine Wo Contrast  05/30/2015  CLINICAL DATA:  Worsening right side neck and right shoulder pain for 3 months with tingling in the right fingers and medial right wrist. Subsequent encounter EXAM: MRI CERVICAL SPINE WITHOUT CONTRAST TECHNIQUE: Multiplanar, multisequence MR imaging of the cervical spine was performed. No intravenous contrast was administered. COMPARISON:  Plain film cervical spine 03/11/2014. MRI cervical spine 03/20/2004. FINDINGS: Vertebral body height and alignment are maintained. There is some degenerative endplate signal change at C5-6. Somewhat attenuated appearance of the tip of the dens is unchanged compared to the prior MRI. The craniocervical junction is normal and cervical cord signal is normal. Imaged paraspinous structures are unremarkable. C2-3:  Negative. C3-4: Shallow disc  bulge without central canal or foraminal narrowing. C4-5: Disc osteophyte complex slightly deforms the ventral cord. Uncovertebral disease causes moderate bilateral foraminal narrowing. Spondylosis shows progression since the 2005 MRI. C5-6: There is a disc osteophyte complex and some uncovertebral disease. The ventral cord is mildly deformed by disc. Moderate bilateral foraminal narrowing is seen. Spondylosis shows progression since the 2005 exam. C6-7: Minimal disc bulge and mild uncovertebral disease. The central canal and foramina appear open. C7-T1: No disc bulge or protrusion. The central canal and foramina appear open. IMPRESSION: Some progression of spondylosis at C4-5 and C5-6 where there is mild deformity of the ventral cord and moderate bilateral foraminal narrowing at each level. Electronically Signed   By: Inge Rise M.D.   On: 05/30/2015 16:18    Assessment & Plan:   Diagnoses and all orders for this visit:  HSV infection -     valACYclovir (VALTREX) 1000 MG tablet; Take 2t po q12h times 2 doses. Repeat with next fever blister episode.  Other  orders -     traMADol (ULTRAM) 50 MG tablet; Take 1-2 tablets (50-100 mg total) by mouth every 6 (six) hours as needed. -     Diclofenac Sodium 2 % SOLN; Apply twice daily. -     hydrOXYzine (ATARAX/VISTARIL) 25 MG tablet;    I am having Mr. Millstein maintain his Fish Oil, cholecalciferol, Vitamin B-12, ketoconazole, valACYclovir, mupirocin ointment, gabapentin, ANUSOL-HC, Diclofenac Sodium, triamcinolone ointment, traMADol, ibuprofen, hydrOXYzine, omeprazole, and ibuprofen.  No orders of the defined types were placed in this encounter.     Follow-up: No Follow-up on file.  Walker Kehr, MD

## 2016-06-07 ENCOUNTER — Ambulatory Visit (INDEPENDENT_AMBULATORY_CARE_PROVIDER_SITE_OTHER): Payer: Federal, State, Local not specified - PPO | Admitting: Family Medicine

## 2016-06-07 ENCOUNTER — Encounter: Payer: Self-pay | Admitting: Family Medicine

## 2016-06-07 ENCOUNTER — Other Ambulatory Visit: Payer: Self-pay

## 2016-06-07 ENCOUNTER — Ambulatory Visit (INDEPENDENT_AMBULATORY_CARE_PROVIDER_SITE_OTHER)
Admission: RE | Admit: 2016-06-07 | Discharge: 2016-06-07 | Disposition: A | Discharge status: Home / Self Care | Payer: Federal, State, Local not specified - PPO | Source: Ambulatory Visit | Attending: Family Medicine | Admitting: Family Medicine

## 2016-06-07 DIAGNOSIS — M79644 Pain in right finger(s): Secondary | ICD-10-CM | POA: Diagnosis not present

## 2016-06-07 DIAGNOSIS — G5601 Carpal tunnel syndrome, right upper limb: Secondary | ICD-10-CM | POA: Diagnosis not present

## 2016-06-07 DIAGNOSIS — M25531 Pain in right wrist: Secondary | ICD-10-CM | POA: Diagnosis not present

## 2016-06-07 MED ORDER — DICLOFENAC SODIUM 2 % TD SOLN: TRANSDERMAL | Status: DC
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
## 2016-06-07 NOTE — Assessment & Plan Note (Signed)
Patient 2 years ago did have an injury. Does appear the patient may have some posterior medical arthritic changes of CMC joint. Patient is also had pulsatile changes of the IP joint of this finger. This could be contribute in. With patient having more of a tingling sensation as well as enlargement of the median nerve possible some carpal tunnel could also be contribute in. We discussed different treatment options. Patient given a brace, home exercises, icing protocol as well. Topical anti-inflammatory's prescribed. Patient come back and see me again in 3-4 weeks.  Worsening pain we'll consider injections as well as formal physical therapy. X-rays are pending.

## 2016-06-07 NOTE — Patient Instructions (Signed)
Good to see you.  Ice 20 minutes 2 times daily. Usually after activity and before bed. pennsaid pinkie amount topically 2 times daily as needed.  Wear brace day and night for the next week then nightly for 2 weeks.  Exercises 3 times a week.  See me again in 3 weeks and if not better we will consider injection in the median nerve or a joint.
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
## 2016-06-29 ENCOUNTER — Ambulatory Visit (INDEPENDENT_AMBULATORY_CARE_PROVIDER_SITE_OTHER): Payer: Federal, State, Local not specified - PPO | Admitting: Family Medicine

## 2016-06-29 ENCOUNTER — Encounter: Payer: Self-pay | Admitting: Family Medicine

## 2016-06-29 DIAGNOSIS — G5601 Carpal tunnel syndrome, right upper limb: Secondary | ICD-10-CM | POA: Diagnosis not present

## 2016-06-29 DIAGNOSIS — M659 Synovitis and tenosynovitis, unspecified: Secondary | ICD-10-CM | POA: Diagnosis not present

## 2016-06-29 NOTE — Assessment & Plan Note (Signed)
Patient did have some [place] arthritic changes as well as a synovitis. Seems to be much better at this time. Patient will continue with conservative therapy and we will see him again on an as-needed basis.

## 2016-06-29 NOTE — Patient Instructions (Addendum)
Great to see you.  You are doing great  Decrease exercises 2 times a week.  Wear brace at night pennsaid pinkie amount topically 2 times daily as needed.  See me when you need me and watch the knee.

## 2016-06-29 NOTE — Assessment & Plan Note (Signed)
Significant improvement at this time. We warned patient warning signs and when to seek medical attention. We discussed forcing symptoms injections could be beneficial. Does have topical anti-inflammatories as needed and has responded fairly well to gabapentin. Patient does well we'll follow-up as needed

## 2016-08-07 ENCOUNTER — Other Ambulatory Visit: Payer: Self-pay | Admitting: Internal Medicine

## 2016-08-11 ENCOUNTER — Other Ambulatory Visit: Payer: Self-pay | Admitting: Internal Medicine

## 2016-09-13 ENCOUNTER — Ambulatory Visit: Payer: Federal, State, Local not specified - PPO | Admitting: Internal Medicine

## 2016-09-13 ENCOUNTER — Other Ambulatory Visit: Payer: Self-pay | Admitting: *Deleted

## 2016-09-13 MED ORDER — HYDROXYZINE HCL 25 MG PO TABS: 25.0000 mg | ORAL_TABLET | Freq: Every evening | ORAL | 0 refills | Status: DC | PRN

## 2016-10-04 ENCOUNTER — Ambulatory Visit: Payer: Federal, State, Local not specified - PPO | Admitting: Internal Medicine

## 2016-10-31 DIAGNOSIS — J069 Acute upper respiratory infection, unspecified: Secondary | ICD-10-CM | POA: Diagnosis not present

## 2016-11-05 ENCOUNTER — Ambulatory Visit: Payer: Federal, State, Local not specified - PPO | Admitting: Internal Medicine

## 2016-11-08 ENCOUNTER — Other Ambulatory Visit: Payer: Self-pay | Admitting: Internal Medicine

## 2016-11-11 ENCOUNTER — Other Ambulatory Visit (INDEPENDENT_AMBULATORY_CARE_PROVIDER_SITE_OTHER): Payer: Federal, State, Local not specified - PPO

## 2016-11-11 DIAGNOSIS — M79644 Pain in right finger(s): Secondary | ICD-10-CM | POA: Diagnosis not present

## 2016-11-11 DIAGNOSIS — Z0000 Encounter for general adult medical examination without abnormal findings: Secondary | ICD-10-CM

## 2016-11-11 DIAGNOSIS — E538 Deficiency of other specified B group vitamins: Secondary | ICD-10-CM

## 2016-11-11 LAB — BASIC METABOLIC PANEL
BUN: 26 mg/dL — ABNORMAL HIGH (ref 6–23)
CALCIUM: 9.5 mg/dL (ref 8.4–10.5)
CHLORIDE: 104 meq/L (ref 96–112)
CO2: 30 meq/L (ref 19–32)
Creatinine, Ser: 1.38 mg/dL (ref 0.40–1.50)
GFR: 67.65 mL/min (ref >60.00)
GLUCOSE: 100 mg/dL — AB (ref 70–99)
Potassium: 4.6 mEq/L (ref 3.5–5.1)
Sodium: 139 mEq/L (ref 135–145)

## 2016-11-11 LAB — LIPID PANEL
CHOL/HDL RATIO: 4
Cholesterol: 218 mg/dL — ABNORMAL HIGH (ref 0–200)
HDL: 55.5 mg/dL (ref >39.00)
LDL CALC: 137 mg/dL — AB (ref 0–99)
NONHDL: 162.58
Triglycerides: 127 mg/dL (ref 0.0–149.0)
VLDL: 25.4 mg/dL (ref 0.0–40.0)

## 2016-11-11 LAB — TSH: TSH: 2.94 u[IU]/mL (ref 0.35–4.50)

## 2016-11-11 LAB — PSA: PSA: 5.28 ng/mL — AB (ref 0.10–4.00)

## 2016-11-11 LAB — VITAMIN B12: Vitamin B-12: 250 pg/mL (ref 211–911)

## 2016-11-12 ENCOUNTER — Ambulatory Visit (INDEPENDENT_AMBULATORY_CARE_PROVIDER_SITE_OTHER): Payer: Federal, State, Local not specified - PPO | Admitting: Internal Medicine

## 2016-11-12 ENCOUNTER — Encounter: Payer: Self-pay | Admitting: Internal Medicine

## 2016-11-12 VITALS — BP 112/82 | HR 69 | Wt 165.0 lb

## 2016-11-12 DIAGNOSIS — E538 Deficiency of other specified B group vitamins: Secondary | ICD-10-CM | POA: Diagnosis not present

## 2016-11-12 DIAGNOSIS — R972 Elevated prostate specific antigen [PSA]: Secondary | ICD-10-CM | POA: Diagnosis not present

## 2016-11-12 DIAGNOSIS — Z0000 Encounter for general adult medical examination without abnormal findings: Secondary | ICD-10-CM

## 2016-11-12 DIAGNOSIS — N320 Bladder-neck obstruction: Secondary | ICD-10-CM | POA: Diagnosis not present

## 2016-11-12 LAB — CBC WITH DIFFERENTIAL/PLATELET
BASOS PCT: 1.7 % (ref 0.0–3.0)
Basophils Absolute: 0.1 10*3/uL (ref 0.0–0.1)
Eosinophils Absolute: 0.1 10*3/uL (ref 0.0–0.7)
Eosinophils Relative: 3.6 % (ref 0.0–5.0)
HEMATOCRIT: 40.8 % (ref 39.0–52.0)
Hemoglobin: 13.8 g/dL (ref 13.0–17.0)
LYMPHS PCT: 26.9 % (ref 12.0–46.0)
Lymphs Abs: 1.1 10*3/uL (ref 0.7–4.0)
MCHC: 33.8 g/dL (ref 30.0–36.0)
MCV: 89.4 fl (ref 78.0–100.0)
Monocytes Absolute: 0.5 10*3/uL (ref 0.1–1.0)
Monocytes Relative: 12.8 % — ABNORMAL HIGH (ref 3.0–12.0)
NEUTROS ABS: 2.2 10*3/uL (ref 1.4–7.7)
NEUTROS PCT: 55 % (ref 43.0–77.0)
PLATELETS: 268 10*3/uL (ref 150.0–400.0)
RBC: 4.56 Mil/uL (ref 4.22–5.81)
RDW: 13.8 % (ref 11.5–15.5)
WBC: 4 10*3/uL (ref 4.0–10.5)

## 2016-11-12 LAB — HEPATITIS C ANTIBODY: HCV Ab: NEGATIVE

## 2016-11-12 MED ORDER — FINASTERIDE 5 MG PO TABS: 5.0000 mg | ORAL_TABLET | Freq: Every day | ORAL | 3 refills | Status: DC

## 2016-11-12 MED ORDER — IBUPROFEN-FAMOTIDINE 800-26.6 MG PO TABS: 1.0000 | ORAL_TABLET | Freq: Two times a day (BID) | ORAL | 3 refills | Status: DC | PRN

## 2016-11-12 MED ORDER — OMEPRAZOLE 40 MG PO CPDR: 40.0000 mg | DELAYED_RELEASE_CAPSULE | Freq: Every day | ORAL | 3 refills | Status: DC

## 2016-11-12 MED ORDER — TRAMADOL HCL 50 MG PO TABS: 50.0000 mg | ORAL_TABLET | Freq: Four times a day (QID) | ORAL | 3 refills | Status: DC | PRN

## 2016-11-12 NOTE — Assessment & Plan Note (Signed)
We discussed age appropriate health related issues, including available/recomended screening tests and vaccinations. We discussed a need for adhering to healthy diet and exercise. Labs ordered. All questions were answered. 

## 2016-11-12 NOTE — Assessment & Plan Note (Signed)
Worse Start Proscar

## 2016-11-12 NOTE — Assessment & Plan Note (Signed)
On B12 

## 2016-11-12 NOTE — Progress Notes (Signed)
Subjective:  Patient ID: Jorge Turner, male    DOB: 1956-02-06  Age: 60 y.o. MRN: [id number]  CC: No chief complaint on file.   HPI Jorge Turner presents for a well exam C/o urinary frequency  Outpatient Medications Prior to Visit  Medication Sig Dispense Refill  . ANUSOL-HC 2.5 % rectal cream APPLY TO AFFECTED AREA TWICE A DAY AS NEEDED 30 g 0  . cholecalciferol (VITAMIN D) 1000 UNITS tablet Take 1,000 Units by mouth daily.      . Cyanocobalamin (VITAMIN B-12) 1000 MCG SUBL Place 1 tablet (1,000 mcg total) under the tongue daily. 100 tablet 3  . Diclofenac Sodium 2 % SOLN Apply twice daily. 112 g 3  . Diclofenac Sodium 2 % SOLN Apply 1 pump twice daily. 112 g 3  . gabapentin (NEURONTIN) 100 MG capsule Take 1 capsule (100 mg total) by mouth 3 (three) times daily as needed. 270 capsule 1  . hydrOXYzine (ATARAX/VISTARIL) 25 MG tablet Take 1-2 tablets (25-50 mg total) by mouth at bedtime as needed for itching. 180 tablet 0  . ibuprofen (ADVIL,MOTRIN) 600 MG tablet TAKE 1 TABLET BY MOUTH TWICE A DAY 180 tablet 0  . ketoconazole (NIZORAL) 2 % cream Apply topically as needed.    . mupirocin ointment (BACTROBAN) 2 % Applied twice a day to the affected area;NOT into eyes. 15 g 0  . Omega-3 Fatty Acids (FISH OIL) 1200 MG CAPS Take 1 capsule by mouth daily.      Marland Kitchen omeprazole (PRILOSEC) 40 MG capsule TAKE 1 CAPSULE (40 MG TOTAL) BY MOUTH DAILY. 90 capsule 2  . traMADol (ULTRAM) 50 MG tablet Take 1-2 tablets (50-100 mg total) by mouth every 6 (six) hours as needed. 60 tablet 3  . triamcinolone ointment (KENALOG) 0.5 % Apply 1 application topically 2 (two) times daily. 45 g 3  . valACYclovir (VALTREX) 1000 MG tablet Take 2t po q12h times 2 doses. Repeat with next fever blister episode. 20 tablet 3  . ibuprofen (ADVIL,MOTRIN) 600 MG tablet TAKE 1 TABLET (600 MG TOTAL) BY MOUTH 2 (TWO) TIMES DAILY. 180 tablet 1   No facility-administered medications prior to visit.     ROS Review of Systems    Constitutional: Negative for appetite change, fatigue and unexpected weight change.  HENT: Negative for congestion, nosebleeds, sneezing, sore throat and trouble swallowing.   Eyes: Negative for itching and visual disturbance.  Respiratory: Negative for cough.   Cardiovascular: Negative for chest pain, palpitations and leg swelling.  Gastrointestinal: Negative for abdominal distention, blood in stool, diarrhea and nausea.  Genitourinary: Positive for frequency and urgency. Negative for hematuria.  Musculoskeletal: Negative for back pain, gait problem, joint swelling and neck pain.  Skin: Negative for rash.  Neurological: Negative for dizziness, tremors, speech difficulty and weakness.  Psychiatric/Behavioral: Negative for agitation, dysphoric mood and sleep disturbance. The patient is not nervous/anxious.     Objective:  BP 112/82   Pulse 69   Wt 165 lb (74.8 kg)   SpO2 93%   BMI 23.68 kg/m   BP Readings from Last 3 Encounters:  11/12/16 112/82  06/29/16 110/70  05/17/16 110/88    Wt Readings from Last 3 Encounters:  11/12/16 165 lb (74.8 kg)  06/29/16 165 lb (74.8 kg)  05/17/16 163 lb (73.9 kg)    Physical Exam  Constitutional: He is oriented to person, place, and time. He appears well-developed and well-nourished. No distress.  HENT:  Head: Normocephalic and atraumatic.  Right Ear: External ear  normal.  Left Ear: External ear normal.  Nose: Nose normal.  Mouth/Throat: Oropharynx is clear and moist. No oropharyngeal exudate.  Eyes: Conjunctivae and EOM are normal. Pupils are equal, round, and reactive to light. Right eye exhibits no discharge. Left eye exhibits no discharge. No scleral icterus.  Neck: Normal range of motion. Neck supple. No JVD present. No tracheal deviation present. No thyromegaly present.  Cardiovascular: Normal rate, regular rhythm, normal heart sounds and intact distal pulses.  Exam reveals no gallop and no friction rub.   No murmur  heard. Pulmonary/Chest: Effort normal and breath sounds normal. No stridor. No respiratory distress. He has no wheezes. He has no rales. He exhibits no tenderness.  Abdominal: Soft. Bowel sounds are normal. He exhibits no distension and no mass. There is no tenderness. There is no rebound and no guarding.  Genitourinary: Rectum normal and penis normal. Rectal exam shows guaiac negative stool. No penile tenderness.  Musculoskeletal: Normal range of motion. He exhibits no edema or tenderness.  Lymphadenopathy:    He has no cervical adenopathy.  Neurological: He is alert and oriented to person, place, and time. He has normal reflexes. No cranial nerve deficit. He exhibits normal muscle tone. Coordination normal.  Skin: Skin is warm and dry. No rash noted. He is not diaphoretic. No erythema. No pallor.  Psychiatric: He has a normal mood and affect. His behavior is normal. Judgment and thought content normal.  1+ prostate  Lab Results  Component Value Date   WBC 4.0 11/11/2016   HGB 13.8 11/11/2016   HCT 40.8 11/11/2016   PLT 268.0 11/11/2016   GLUCOSE 100 (H) 11/11/2016   CHOL 218 (H) 11/11/2016   TRIG 127.0 11/11/2016   HDL 55.50 11/11/2016   LDLDIRECT 140.3 10/26/2011   LDLCALC 137 (H) 11/11/2016   ALT 15 06/11/2013   AST 16 06/11/2013   NA 139 11/11/2016   K 4.6 11/11/2016   CL 104 11/11/2016   CREATININE 1.38 11/11/2016   BUN 26 (H) 11/11/2016   CO2 30 11/11/2016   TSH 2.94 11/11/2016   PSA 5.28 (H) 11/11/2016    Dg Wrist Complete Right  Result Date: 06/07/2016 CLINICAL DATA:  Generalized wrist pain. EXAM: RIGHT WRIST - COMPLETE 3+ VIEW COMPARISON:  05/17/2016 thumb images FINDINGS: Evidence of old ulnar styloid fracture. No acute fracture, subluxation or dislocation. Joint spaces are maintained. IMPRESSION: No acute bony abnormality. Electronically Signed   By: Rolm Baptise M.D.   On: 06/07/2016 11:08   [place] Extrem Up Right Ltd  Result Date: 06/10/2016 MSK US performed of:  right wrist This study was ordered, performed, and interpreted by Charlann Boxer D.O. Wrist: All extensor compartments visualized and tendons all normal in appearance without fraying, tears, or sheath effusions. No effusion seen. TFCC intact assessment degenerative changes Scapholunate ligament intact appears to have post arthritic changes of the scaphoid lunate joint CMC joint has some mild arthritic changes as well.Postsurgical changes of the IP joint on the thumb Carpal tunnel visualized and median nerve area enlarged with an area of 2.0 cm. Patient does have hypoechoic changes and mild increase in Doppler flow. IMPRESSION: Mild diffuse arthritic changes with carpal tunnel enlargement of 2.0 cm   Assessment & Plan:   There are no diagnoses linked to this encounter. I am having Mr. Ante maintain his Fish Oil, cholecalciferol, Vitamin B-12, ketoconazole, mupirocin ointment, gabapentin, ANUSOL-HC, triamcinolone ointment, omeprazole, traMADol, Diclofenac Sodium, valACYclovir, Diclofenac Sodium, hydrOXYzine, and ibuprofen.  No orders of the defined types were placed  in this encounter.    Follow-up: No Follow-up on file.  Walker Kehr, MD

## 2016-11-12 NOTE — Progress Notes (Signed)
Pre visit review using our clinic review tool, if applicable. No additional management support is needed unless otherwise documented below in the visit note. 

## 2016-11-16 ENCOUNTER — Other Ambulatory Visit: Payer: Self-pay | Admitting: Internal Medicine

## 2016-12-08 ENCOUNTER — Ambulatory Visit: Payer: No Typology Code available for payment source | Admitting: Internal Medicine

## 2016-12-09 ENCOUNTER — Ambulatory Visit (INDEPENDENT_AMBULATORY_CARE_PROVIDER_SITE_OTHER): Payer: Federal, State, Local not specified - PPO | Admitting: Internal Medicine

## 2016-12-09 ENCOUNTER — Encounter: Payer: Self-pay | Admitting: Internal Medicine

## 2016-12-09 ENCOUNTER — Ambulatory Visit: Payer: No Typology Code available for payment source | Admitting: Internal Medicine

## 2016-12-09 VITALS — BP 128/72 | HR 81 | Temp 98.3°F | Resp 20 | Wt 166.0 lb

## 2016-12-09 DIAGNOSIS — R062 Wheezing: Secondary | ICD-10-CM | POA: Diagnosis not present

## 2016-12-09 DIAGNOSIS — R05 Cough: Secondary | ICD-10-CM

## 2016-12-09 DIAGNOSIS — R059 Cough, unspecified: Secondary | ICD-10-CM

## 2016-12-09 DIAGNOSIS — L309 Dermatitis, unspecified: Secondary | ICD-10-CM | POA: Diagnosis not present

## 2016-12-09 MED ORDER — PREDNISONE 10 MG PO TABS: ORAL_TABLET | ORAL | 0 refills | Status: DC

## 2016-12-09 MED ORDER — HYDROCODONE-HOMATROPINE 5-1.5 MG/5ML PO SYRP: 5.0000 mL | ORAL_SOLUTION | Freq: Four times a day (QID) | ORAL | 0 refills | Status: AC | PRN

## 2016-12-09 MED ORDER — TRIAMCINOLONE ACETONIDE 0.5 % EX OINT
1.0000 | TOPICAL_OINTMENT | Freq: Two times a day (BID) | CUTANEOUS | 3 refills | Status: DC
Start: 2016-12-09 — End: 2018-04-04

## 2016-12-09 MED ORDER — LEVOFLOXACIN 500 MG PO TABS: 500.0000 mg | ORAL_TABLET | Freq: Every day | ORAL | 0 refills | Status: DC

## 2016-12-09 MED ORDER — LEVOFLOXACIN 500 MG PO TABS: 500.0000 mg | ORAL_TABLET | Freq: Every day | ORAL | 0 refills | Status: AC

## 2016-12-09 NOTE — Patient Instructions (Signed)
You had the steroid shot today  Please take all new medication as prescribed - the antibiotic, cough medicine, and prednisone  Please call in 2-3 days or sooner if not improved or worse, to consider chest xray  Please continue all other medications as before, and refills have been done if requested.  Please have the pharmacy call with any other refills you may need.  Please keep your appointments with your specialists as you may have planned

## 2016-12-09 NOTE — Assessment & Plan Note (Signed)
Mild to mod, for depomedrol IM 80, for predpac asd,  to f/u any worsening symptoms or concerns 

## 2016-12-09 NOTE — Progress Notes (Signed)
Pre visit review using our clinic review tool, if applicable. No additional management support is needed unless otherwise documented below in the visit note. 

## 2016-12-09 NOTE — Progress Notes (Signed)
Subjective:    Patient ID: Jorge Turner, male    DOB: 10/23/1956, 61 y.o.   MRN: [id number]  HPI  Here with acute onset mild to mod 2-3 days ST, HA, general weakness and malaise, with prod cough greenish sputum, but Pt denies chest pain, increased sob or doe, wheezing, orthopnea, PND, increased LE swelling, palpitations, dizziness or syncope, except for onset mild wheezing and sob last PM.  Pt denies new neurological symptoms such as new headache, or facial or extremity weakness or numbness   Pt denies polydipsia, polyuria.  Also with a flare of itchy rash to the left lateral ankle, just cant stop scratching. Past Medical History:  Diagnosis Date  . Balanitis   . BPH (benign prostatic hypertrophy)   . DJD (degenerative joint disease) of knee   . Eczema   . GERD (gastroesophageal reflux disease)   . Hemorrhoid   . Low back pain   . Personal history of colonic polyps 03/03/2010  . Vitamin B12 deficiency    Past Surgical History:  Procedure Laterality Date  . CIRCUMCISION N/A 07/20/2013   Procedure: CIRCUMCISION ADULT;  Surgeon: Claybon Jabs, MD;  Location: [facility];  Service: Urology;  Laterality: N/A;  . CYSTO REMOVAL RIGHT THUMB  2006  . RIGHT HYDROCELECTOMY  11-13-2010    reports that he has never smoked. He has never used smokeless tobacco. He reports that he does not drink alcohol or use drugs. family history includes Diabetes in his father and mother; Hypertension in his father and mother. Allergies  Allergen Reactions  . Erythromycin Ethylsuccinate Anaphylaxis  . Doxycycline Nausea And Vomiting   Current Outpatient Prescriptions on File Prior to Visit  Medication Sig Dispense Refill  . ANUSOL-HC 2.5 % rectal cream APPLY TO AFFECTED AREA TWICE A DAY AS NEEDED 30 g 0  . cholecalciferol (VITAMIN D) 1000 UNITS tablet Take 1,000 Units by mouth daily.      . Cyanocobalamin (VITAMIN B-12) 1000 MCG SUBL Place 1 tablet (1,000 mcg total) under the tongue daily. 100  tablet 3  . Diclofenac Sodium 2 % SOLN Apply twice daily. 112 g 3  . Diclofenac Sodium 2 % SOLN Apply 1 pump twice daily. 112 g 3  . finasteride (PROSCAR) 5 MG tablet Take 1 tablet (5 mg total) by mouth daily. 90 tablet 3  . gabapentin (NEURONTIN) 100 MG capsule Take 1 capsule (100 mg total) by mouth 3 (three) times daily as needed. 270 capsule 1  . hydrOXYzine (ATARAX/VISTARIL) 25 MG tablet Take 1-2 tablets (25-50 mg total) by mouth at bedtime as needed for itching. 180 tablet 0  . Ibuprofen-Famotidine (DUEXIS) 800-26.6 MG TABS Take 1 tablet by mouth 2 (two) times daily as needed. 60 tablet 3  . ketoconazole (NIZORAL) 2 % cream Apply topically as needed.    . mupirocin ointment (BACTROBAN) 2 % Applied twice a day to the affected area;NOT into eyes. 15 g 0  . Omega-3 Fatty Acids (FISH OIL) 1200 MG CAPS Take 1 capsule by mouth daily.      Marland Kitchen omeprazole (PRILOSEC) 40 MG capsule Take 1 capsule (40 mg total) by mouth daily. 90 capsule 3  . traMADol (ULTRAM) 50 MG tablet Take 1-2 tablets (50-100 mg total) by mouth every 6 (six) hours as needed. 60 tablet 3  . triamcinolone ointment (KENALOG) 0.5 % Apply 1 application topically 2 (two) times daily. 45 g 3  . valACYclovir (VALTREX) 1000 MG tablet Take 2t po q12h times 2 doses. Repeat  with next fever blister episode. 20 tablet 3   No current facility-administered medications on file prior to visit.    Review of Systems  Constitutional: Negative for unusual diaphoresis or night sweats HENT: Negative for ear swelling or discharge Eyes: Negative for worsening visual haziness  Respiratory: Negative for choking and stridor.   Gastrointestinal: Negative for distension or worsening eructation Genitourinary: Negative for retention or change in urine volume.  Musculoskeletal: Negative for other MSK pain or swelling Skin: Negative for color change and worsening wound Neurological: Negative for tremors and numbness other than noted  Psychiatric/Behavioral:  Negative for decreased concentration or agitation other than above   All other system neg per pt    Objective:   Physical Exam BP 128/72   Pulse 81   Temp 98.3 F (36.8 C) (Oral)   Resp 20   Wt 166 lb (75.3 kg)   SpO2 97%   BMI 23.82 kg/m  VS noted, mild ill Constitutional: Pt appears in no apparent distress HENT: Head: NCAT.  Right Ear: External ear normal.  Left Ear: External ear normal.  Eyes: . Pupils are equal, round, and reactive to light. Conjunctivae and EOM are normal Bilat tm's with mild erythema.  Max sinus areas non tender.  Pharynx with mild erythema, no exudate  Neck: Normal range of motion. Neck supple.  Cardiovascular: Normal rate and regular rhythm.   Pulmonary/Chest: Effort normal and breath sounds decreased without rales but with few mild scattered wheezing.  Neurological: Pt is alert. Not confused , motor grossly intact Skin: Skin is warm. No rash, no LE edema Psychiatric: Pt behavior is normal. No agitation.  No other new exam findings    Assessment & Plan:

## 2016-12-09 NOTE — Assessment & Plan Note (Signed)
Mild to mod, c/w bronchitis vs pna, declines cxr, for antibx course, cough med prn,  to f/u any worsening symptoms or concerns 

## 2016-12-09 NOTE — Assessment & Plan Note (Signed)
[place] for refill triam cream prn,  to f/u any worsening symptoms or concerns

## 2016-12-17 ENCOUNTER — Encounter: Payer: Self-pay | Admitting: Sports Medicine

## 2016-12-17 ENCOUNTER — Ambulatory Visit (INDEPENDENT_AMBULATORY_CARE_PROVIDER_SITE_OTHER)
Admission: RE | Admit: 2016-12-17 | Discharge: 2016-12-17 | Disposition: A | Discharge status: Home / Self Care | Payer: Federal, State, Local not specified - PPO | Source: Ambulatory Visit | Attending: Sports Medicine | Admitting: Sports Medicine

## 2016-12-17 ENCOUNTER — Ambulatory Visit (INDEPENDENT_AMBULATORY_CARE_PROVIDER_SITE_OTHER): Payer: Federal, State, Local not specified - PPO | Admitting: Sports Medicine

## 2016-12-17 VITALS — BP 120/84 | HR 76 | Temp 98.3°F | Ht 70.0 in | Wt 166.0 lb

## 2016-12-17 DIAGNOSIS — R05 Cough: Secondary | ICD-10-CM

## 2016-12-17 DIAGNOSIS — R059 Cough, unspecified: Secondary | ICD-10-CM

## 2016-12-17 NOTE — Patient Instructions (Signed)
We will call you with the results of your chest x-ray.  Please start taking your gabapentin at nighttime for the next 3 days then increase to twice per day for an additional 3 days then it is okay to take it 3 times per day as needed.  I would also like for you to take your Prilosec every day for the next 6 days.  If you're feeling better and her cough is decreased it is okay to stop 1 of the above medicines.
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
## 2016-12-22 ENCOUNTER — Ambulatory Visit (INDEPENDENT_AMBULATORY_CARE_PROVIDER_SITE_OTHER): Payer: Federal, State, Local not specified - PPO | Admitting: Family Medicine

## 2016-12-22 ENCOUNTER — Encounter: Payer: Self-pay | Admitting: Family Medicine

## 2016-12-22 DIAGNOSIS — M6289 Other specified disorders of muscle: Secondary | ICD-10-CM

## 2016-12-22 DIAGNOSIS — M65831 Other synovitis and tenosynovitis, right forearm: Secondary | ICD-10-CM

## 2016-12-22 DIAGNOSIS — M25561 Pain in right knee: Secondary | ICD-10-CM | POA: Diagnosis not present

## 2016-12-22 DIAGNOSIS — M25562 Pain in left knee: Secondary | ICD-10-CM

## 2016-12-22 DIAGNOSIS — S83242D Other tear of medial meniscus, current injury, left knee, subsequent encounter: Secondary | ICD-10-CM

## 2016-12-22 DIAGNOSIS — M629 Disorder of muscle, unspecified: Secondary | ICD-10-CM

## 2016-12-22 DIAGNOSIS — G8929 Other chronic pain: Secondary | ICD-10-CM

## 2016-12-22 DIAGNOSIS — G5601 Carpal tunnel syndrome, right upper limb: Secondary | ICD-10-CM | POA: Diagnosis not present

## 2016-12-22 MED ORDER — IBUPROFEN-FAMOTIDINE 800-26.6 MG PO TABS: ORAL_TABLET | ORAL | 3 refills | Status: DC

## 2016-12-22 NOTE — Patient Instructions (Signed)
Good to see you  Ice is your friend Compression on the thigh can help the hamstring.  Try to get back to walking I think it will get better.  pennsaid pinkie amount topically 2 times daily as needed.  For the wrist then wear brace day and night for 1 weeks.  See me again in 4 weeks.

## 2016-12-23 DIAGNOSIS — J040 Acute laryngitis: Secondary | ICD-10-CM | POA: Diagnosis not present

## 2016-12-23 DIAGNOSIS — J320 Chronic maxillary sinusitis: Secondary | ICD-10-CM | POA: Diagnosis not present

## 2016-12-23 DIAGNOSIS — M65831 Other synovitis and tenosynovitis, right forearm: Secondary | ICD-10-CM | POA: Insufficient documentation

## 2016-12-23 DIAGNOSIS — J322 Chronic ethmoidal sinusitis: Secondary | ICD-10-CM | POA: Diagnosis not present

## 2016-12-23 DIAGNOSIS — M6289 Other specified disorders of muscle: Secondary | ICD-10-CM | POA: Insufficient documentation

## 2016-12-23 DIAGNOSIS — J039 Acute tonsillitis, unspecified: Secondary | ICD-10-CM | POA: Diagnosis not present

## 2016-12-23 NOTE — Assessment & Plan Note (Signed)
Still chronic. Patellofemoral syndrome noted. We'll continue with conservative therapy. Some tightness of hamstrings today. We will start some new exercises including askling.

## 2016-12-23 NOTE — Assessment & Plan Note (Signed)
Home exercises given, discussed compression sleeve, discussed icing. Follow-up 4 weeks
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
## 2016-12-23 NOTE — Assessment & Plan Note (Signed)
Stable.  No change in management 

## 2016-12-29 ENCOUNTER — Encounter: Payer: Self-pay | Admitting: Internal Medicine

## 2016-12-29 ENCOUNTER — Ambulatory Visit (INDEPENDENT_AMBULATORY_CARE_PROVIDER_SITE_OTHER): Payer: Federal, State, Local not specified - PPO | Admitting: Internal Medicine

## 2016-12-29 DIAGNOSIS — R071 Chest pain on breathing: Secondary | ICD-10-CM

## 2016-12-29 DIAGNOSIS — E538 Deficiency of other specified B group vitamins: Secondary | ICD-10-CM | POA: Diagnosis not present

## 2016-12-29 NOTE — Assessment & Plan Note (Signed)
On B12 

## 2016-12-29 NOTE — Progress Notes (Signed)
Pre visit review using our clinic review tool, if applicable. No additional management support is needed unless otherwise documented below in the visit note. 

## 2016-12-29 NOTE — Assessment & Plan Note (Signed)
?  pleuresy on the L Start Duexis x 2 weeks Recent CXR was ok Use a cough syrup

## 2016-12-29 NOTE — Progress Notes (Signed)
Subjective:  Patient ID: Jorge Turner, male    DOB: 11-20-1956  Age: 61 y.o. MRN: [id number]  CC: Cough (x 1 month) and Left rib pain (x 3 days)   HPI Jorge Turner presents for a cough x 5 weeks and L CP w/cough since Sunday - it was severe, better now He had a recent abx w/Dr Ernesto Rutherford recently   Outpatient Medications Prior to Visit  Medication Sig Dispense Refill  . ANUSOL-HC 2.5 % rectal cream APPLY TO AFFECTED AREA TWICE A DAY AS NEEDED 30 g 0  . cholecalciferol (VITAMIN D) 1000 UNITS tablet Take 1,000 Units by mouth daily.      . Cyanocobalamin (VITAMIN B-12) 1000 MCG SUBL Place 1 tablet (1,000 mcg total) under the tongue daily. 100 tablet 3  . Diclofenac Sodium 2 % SOLN Apply 1 pump twice daily. 112 g 3  . finasteride (PROSCAR) 5 MG tablet Take 1 tablet (5 mg total) by mouth daily. 90 tablet 3  . gabapentin (NEURONTIN) 100 MG capsule Take 1 capsule (100 mg total) by mouth 3 (three) times daily as needed. 270 capsule 1  . hydrOXYzine (ATARAX/VISTARIL) 25 MG tablet Take 1-2 tablets (25-50 mg total) by mouth at bedtime as needed for itching. 180 tablet 0  . Ibuprofen-Famotidine (DUEXIS) 800-26.6 MG TABS Take 1 tablet by mouth 2 (two) times daily as needed. 60 tablet 3  . ketoconazole (NIZORAL) 2 % cream Apply topically as needed.    . mupirocin ointment (BACTROBAN) 2 % Applied twice a day to the affected area;NOT into eyes. 15 g 0  . Omega-3 Fatty Acids (FISH OIL) 1200 MG CAPS Take 1 capsule by mouth daily.      Marland Kitchen omeprazole (PRILOSEC) 40 MG capsule Take 1 capsule (40 mg total) by mouth daily. 90 capsule 3  . traMADol (ULTRAM) 50 MG tablet Take 1-2 tablets (50-100 mg total) by mouth every 6 (six) hours as needed. 60 tablet 3  . triamcinolone ointment (KENALOG) 0.5 % Apply 1 application topically 2 (two) times daily. 45 g 3  . valACYclovir (VALTREX) 1000 MG tablet Take 2t po q12h times 2 doses. Repeat with next fever blister episode. 20 tablet 3  . Diclofenac Sodium 2 % SOLN Apply  twice daily. 112 g 3  . Ibuprofen-Famotidine (DUEXIS) 800-26.6 MG TABS Take 1 tablet 3 times daily as needed. 270 tablet 3  . predniSONE (DELTASONE) 10 MG tablet 3 tabs by mouth per day for 3 days,2tabs per day for 3 days,1tab per day for 3 days 18 tablet 0   No facility-administered medications prior to visit.     ROS Review of Systems  Constitutional: Negative for appetite change, fatigue and unexpected weight change.  HENT: Negative for congestion, nosebleeds, sneezing, sore throat and trouble swallowing.   Eyes: Negative for itching and visual disturbance.  Respiratory: Positive for cough, chest tightness, shortness of breath and wheezing.   Cardiovascular: Positive for chest pain. Negative for palpitations and leg swelling.  Gastrointestinal: Negative for abdominal distention, blood in stool, diarrhea and nausea.  Genitourinary: Negative for frequency and hematuria.  Musculoskeletal: Negative for back pain, gait problem, joint swelling and neck pain.  Skin: Negative for rash.  Neurological: Negative for dizziness, tremors, speech difficulty and weakness.  Psychiatric/Behavioral: Negative for agitation, dysphoric mood and sleep disturbance. The patient is not nervous/anxious.     Objective:  BP 120/70 (BP Location: Left Arm, Patient Position: Sitting, Cuff Size: Normal)   Pulse 81   Temp 97.7 F (36.5  C) (Oral)   Resp 20   Ht 5\' 10"  (1.778 m)   Wt 163 lb 8 oz (74.2 kg)   SpO2 96%   BMI 23.46 kg/m   BP Readings from Last 3 Encounters:  12/29/16 120/70  12/22/16 110/80  12/17/16 120/84    Wt Readings from Last 3 Encounters:  12/29/16 163 lb 8 oz (74.2 kg)  12/22/16 162 lb (73.5 kg)  12/17/16 166 lb (75.3 kg)    Physical Exam  Constitutional: He is oriented to person, place, and time. He appears well-developed. No distress.  NAD  HENT:  Mouth/Throat: Oropharynx is clear and moist.  Eyes: Conjunctivae are normal. Pupils are equal, round, and reactive to light.    Neck: Normal range of motion. No JVD present. No thyromegaly present.  Cardiovascular: Normal rate, regular rhythm, normal heart sounds and intact distal pulses.  Exam reveals no gallop and no friction rub.   No murmur heard. Pulmonary/Chest: Effort normal and breath sounds normal. No respiratory distress. He has no wheezes. He has no rales. He exhibits no tenderness.  Abdominal: Soft. Bowel sounds are normal. He exhibits no distension and no mass. There is no tenderness. There is no rebound and no guarding.  Musculoskeletal: Normal range of motion. He exhibits tenderness. He exhibits no edema.  Lymphadenopathy:    He has no cervical adenopathy.  Neurological: He is alert and oriented to person, place, and time. He has normal reflexes. No cranial nerve deficit. He exhibits normal muscle tone. He displays a negative Romberg sign. Coordination and gait normal.  Skin: Skin is warm and dry. No rash noted.  Psychiatric: He has a normal mood and affect. His behavior is normal. Judgment and thought content normal.   L chest wall is tender to palpation  Lab Results  Component Value Date   WBC 4.0 11/11/2016   HGB 13.8 11/11/2016   HCT 40.8 11/11/2016   PLT 268.0 11/11/2016   GLUCOSE 100 (H) 11/11/2016   CHOL 218 (H) 11/11/2016   TRIG 127.0 11/11/2016   HDL 55.50 11/11/2016   LDLDIRECT 140.3 10/26/2011   LDLCALC 137 (H) 11/11/2016   ALT 15 06/11/2013   AST 16 06/11/2013   NA 139 11/11/2016   K 4.6 11/11/2016   CL 104 11/11/2016   CREATININE 1.38 11/11/2016   BUN 26 (H) 11/11/2016   CO2 30 11/11/2016   TSH 2.94 11/11/2016   PSA 5.28 (H) 11/11/2016    Dg Chest 2 View  Result Date: 12/17/2016 CLINICAL DATA:  Cough and congestion.  Headaches. EXAM: CHEST  2 VIEW COMPARISON:  None. FINDINGS: The heart size and mediastinal contours are within normal limits. Both lungs are clear. The visualized skeletal structures are unremarkable. IMPRESSION: Negative two view chest x-ray Electronically  Signed   By: San Morelle M.D.   On: 12/17/2016 14:43    Assessment & Plan:   There are no diagnoses linked to this encounter. I have discontinued Mr. Dun predniSONE. I am also having him maintain his Fish Oil, cholecalciferol, Vitamin B-12, ketoconazole, mupirocin ointment, gabapentin, ANUSOL-HC, valACYclovir, Diclofenac Sodium, hydrOXYzine, traMADol, omeprazole, finasteride, Ibuprofen-Famotidine, and triamcinolone ointment.  No orders of the defined types were placed in this encounter.    Follow-up: No Follow-up on file.  Walker Kehr, MD

## 2017-01-02 ENCOUNTER — Other Ambulatory Visit: Payer: Self-pay | Admitting: Internal Medicine

## 2017-01-16 ENCOUNTER — Other Ambulatory Visit: Payer: Self-pay | Admitting: Internal Medicine

## 2017-02-07 ENCOUNTER — Ambulatory Visit: Payer: Federal, State, Local not specified - PPO | Admitting: Internal Medicine

## 2017-02-17 ENCOUNTER — Telehealth: Payer: Self-pay | Admitting: *Deleted

## 2017-02-17 NOTE — Telephone Encounter (Signed)
Left msg on triage stating pt is needing to get a PA on the duexis requesting clinical notes to be faxed to [phone]. Faxed dr. Tamala Julian last ov...Johny Chess

## 2017-02-17 NOTE — Telephone Encounter (Signed)
Duplicate...Jorge Turner

## 2017-02-18 ENCOUNTER — Ambulatory Visit (INDEPENDENT_AMBULATORY_CARE_PROVIDER_SITE_OTHER): Payer: Federal, State, Local not specified - PPO | Admitting: Internal Medicine

## 2017-02-18 ENCOUNTER — Encounter: Payer: Self-pay | Admitting: Internal Medicine

## 2017-02-18 DIAGNOSIS — E538 Deficiency of other specified B group vitamins: Secondary | ICD-10-CM

## 2017-02-18 DIAGNOSIS — R059 Cough, unspecified: Secondary | ICD-10-CM

## 2017-02-18 DIAGNOSIS — K219 Gastro-esophageal reflux disease without esophagitis: Secondary | ICD-10-CM

## 2017-02-18 DIAGNOSIS — M545 Low back pain: Secondary | ICD-10-CM

## 2017-02-18 DIAGNOSIS — R05 Cough: Secondary | ICD-10-CM

## 2017-02-18 DIAGNOSIS — G8929 Other chronic pain: Secondary | ICD-10-CM

## 2017-02-18 MED ORDER — OMEPRAZOLE 40 MG PO CPDR: 40.0000 mg | DELAYED_RELEASE_CAPSULE | Freq: Every day | ORAL | 3 refills | Status: DC

## 2017-02-18 MED ORDER — FLUTICASONE FUROATE-VILANTEROL 100-25 MCG/INH IN AEPB: 1.0000 | INHALATION_SPRAY | Freq: Every day | RESPIRATORY_TRACT | 5 refills | Status: DC

## 2017-02-18 MED ORDER — METHYLPREDNISOLONE ACETATE 80 MG/ML IJ SUSP
80.0000 mg | Freq: Once | INTRAMUSCULAR | Status: AC
  Administered 2017-02-18: 80 mg via INTRAMUSCULAR

## 2017-02-18 NOTE — Assessment & Plan Note (Signed)
Try Protonix x 1 mo due to cough

## 2017-02-18 NOTE — Assessment & Plan Note (Signed)
On B12 

## 2017-02-18 NOTE — Assessment & Plan Note (Signed)
Tramadol prn 

## 2017-02-18 NOTE — Progress Notes (Signed)
Pre-visit discussion using our clinic review tool. No additional management support is needed unless otherwise documented below in the visit note.  

## 2017-02-18 NOTE — Assessment & Plan Note (Addendum)
post-URI x 3 mo Asthma in the familly Breo qd Tramadol prn Use PPI daily x 1 mo Depomedrol IM 80 mg given

## 2017-02-18 NOTE — Progress Notes (Signed)
Subjective:  Patient ID: Jorge Turner, male    DOB: January 05, 1956  Age: 61 y.o. MRN: [id number]  CC: Follow-up (PSA, GERD, ); Cough (4 months); and Wrist Pain (right, aches, weaker, several months )   HPI [name] presents for dry cough x 3 mo, LBP, GERD f/u wrist pain   Outpatient Medications Prior to Visit  Medication Sig Dispense Refill  . ANUSOL-HC 2.5 % rectal cream APPLY TO AFFECTED AREA TWICE A DAY AS NEEDED 30 g 0  . cholecalciferol (VITAMIN D) 1000 UNITS tablet Take 1,000 Units by mouth daily.      . Cyanocobalamin (VITAMIN B-12) 1000 MCG SUBL Place 1 tablet (1,000 mcg total) under the tongue daily. 100 tablet 3  . Diclofenac Sodium 2 % SOLN Apply 1 pump twice daily. 112 g 3  . finasteride (PROSCAR) 5 MG tablet Take 1 tablet (5 mg total) by mouth daily. 90 tablet 3  . gabapentin (NEURONTIN) 100 MG capsule Take 1 capsule (100 mg total) by mouth 3 (three) times daily as needed. 270 capsule 1  . hydrOXYzine (ATARAX/VISTARIL) 25 MG tablet TAKE 1-2 TABLETS (25-50 MG TOTAL) BY MOUTH AT BEDTIME AS NEEDED FOR ITCHING. 180 tablet 0  . ibuprofen (ADVIL,MOTRIN) 600 MG tablet TAKE 1 TABLET BY MOUTH TWICE A DAY 180 tablet 0  . Ibuprofen-Famotidine (DUEXIS) 800-26.6 MG TABS Take 1 tablet by mouth 2 (two) times daily as needed. 60 tablet 3  . ketoconazole (NIZORAL) 2 % cream Apply topically as needed.    . mupirocin ointment (BACTROBAN) 2 % Applied twice a day to the affected area;NOT into eyes. 15 g 0  . Omega-3 Fatty Acids (FISH OIL) 1200 MG CAPS Take 1 capsule by mouth daily.      Marland Kitchen omeprazole (PRILOSEC) 40 MG capsule Take 1 capsule (40 mg total) by mouth daily. 90 capsule 3  . traMADol (ULTRAM) 50 MG tablet Take 1-2 tablets (50-100 mg total) by mouth every 6 (six) hours as needed. 60 tablet 3  . triamcinolone ointment (KENALOG) 0.5 % Apply 1 application topically 2 (two) times daily. 45 g 3  . valACYclovir (VALTREX) 1000 MG tablet Take 2t po q12h times 2 doses. Repeat with next fever  blister episode. 20 tablet 3   No facility-administered medications prior to visit.     ROS Review of Systems  Constitutional: Negative for appetite change, fatigue and unexpected weight change.  HENT: Negative for congestion, nosebleeds, sneezing, sore throat and trouble swallowing.   Eyes: Negative for itching and visual disturbance.  Respiratory: Positive for cough.   Cardiovascular: Negative for chest pain, palpitations and leg swelling.  Gastrointestinal: Negative for abdominal distention, blood in stool, diarrhea and nausea.  Genitourinary: Negative for frequency and hematuria.  Musculoskeletal: Positive for arthralgias and back pain. Negative for gait problem, joint swelling and neck pain.  Skin: Negative for rash.  Neurological: Negative for dizziness, tremors, speech difficulty and weakness.  Psychiatric/Behavioral: Negative for agitation, dysphoric mood and sleep disturbance. The patient is not nervous/anxious.     Objective:  BP 98/72   Pulse 63   Temp 98.3 F (36.8 C) (Oral)   Resp 16   Ht 5\' 10"  (1.778 m)   Wt 162 lb 8 oz (73.7 kg)   SpO2 95%   BMI 23.32 kg/m   BP Readings from Last 3 Encounters:  02/18/17 98/72  12/29/16 120/70  12/22/16 110/80    Wt Readings from Last 3 Encounters:  02/18/17 162 lb 8 oz (73.7 kg)  12/29/16  163 lb 8 oz (74.2 kg)  12/22/16 162 lb (73.5 kg)    Physical Exam  Constitutional: He is oriented to person, place, and time. He appears well-developed. No distress.  NAD  HENT:  Mouth/Throat: Oropharynx is clear and moist.  Eyes: Conjunctivae are normal. Pupils are equal, round, and reactive to light.  Neck: Normal range of motion. No JVD present. No thyromegaly present.  Cardiovascular: Normal rate, regular rhythm, normal heart sounds and intact distal pulses.  Exam reveals no gallop and no friction rub.   No murmur heard. Pulmonary/Chest: Effort normal and breath sounds normal. No respiratory distress. He has no wheezes. He has  no rales. He exhibits no tenderness.  Abdominal: Soft. Bowel sounds are normal. He exhibits no distension and no mass. There is no tenderness. There is no rebound and no guarding.  Musculoskeletal: Normal range of motion. He exhibits no edema or tenderness.  Lymphadenopathy:    He has no cervical adenopathy.  Neurological: He is alert and oriented to person, place, and time. He has normal reflexes. No cranial nerve deficit. He exhibits normal muscle tone. He displays a negative Romberg sign. Coordination and gait normal.  Skin: Skin is warm and dry. No rash noted.  Psychiatric: He has a normal mood and affect. His behavior is normal. Judgment and thought content normal.  wrist - tender on the R I personally provided Breo inhaler use teaching. After the teaching patient was able to demonstrate it's use effectively. All questions were answered   Lab Results  Component Value Date   WBC 4.0 11/11/2016   HGB 13.8 11/11/2016   HCT 40.8 11/11/2016   PLT 268.0 11/11/2016   GLUCOSE 100 (H) 11/11/2016   CHOL 218 (H) 11/11/2016   TRIG 127.0 11/11/2016   HDL 55.50 11/11/2016   LDLDIRECT 140.3 10/26/2011   LDLCALC 137 (H) 11/11/2016   ALT 15 06/11/2013   AST 16 06/11/2013   NA 139 11/11/2016   K 4.6 11/11/2016   CL 104 11/11/2016   CREATININE 1.38 11/11/2016   BUN 26 (H) 11/11/2016   CO2 30 11/11/2016   TSH 2.94 11/11/2016   PSA 5.28 (H) 11/11/2016    Dg Chest 2 View  Result Date: 12/17/2016 CLINICAL DATA:  Cough and congestion.  Headaches. EXAM: CHEST  2 VIEW COMPARISON:  None. FINDINGS: The heart size and mediastinal contours are within normal limits. Both lungs are clear. The visualized skeletal structures are unremarkable. IMPRESSION: Negative two view chest x-ray Electronically Signed   By: San Morelle M.D.   On: 12/17/2016 14:43    Assessment & Plan:   There are no diagnoses linked to this encounter. I am having Mr. Loescher maintain his Fish Oil, cholecalciferol, Vitamin  B-12, ketoconazole, mupirocin ointment, gabapentin, ANUSOL-HC, valACYclovir, Diclofenac Sodium, traMADol, omeprazole, finasteride, Ibuprofen-Famotidine, triamcinolone ointment, hydrOXYzine, and ibuprofen.  No orders of the defined types were placed in this encounter.    Follow-up: No Follow-up on file.  Walker Kehr, MD

## 2017-03-06 NOTE — Progress Notes (Signed)
[name] [place], [place] [address] Phone: [phone] Subjective:    I'm seeing this patient by the request  of:    CC: Right wrist pain, posterior right knee pain  [id number]  Jorge Turner is a 61 y.o. male coming in with complaint of right wrist pain. Has had a history of carpal tunnel previously. Patient was having more of an extensor tendinitis. Seem to be getting somewhat better with conservative therapy. Patient states Not having as much of the numbness. Unfortunately continues to have no weakness of the wrist. States that this is more concerning at this time. Patient states certain things such as holding a bowling ball seems to be okay then unfortunately patient has worsening symptoms with such things as holding a cup. Patient notices any arm pain but states that sometimes it feels uncomfortable as well.     patient previous imaging of the patient's wrist was in July 2017. Independently visualized by me showing no significant bony normality.  Past Medical History:  Diagnosis Date  . Balanitis   . BPH (benign prostatic hypertrophy)   . DJD (degenerative joint disease) of knee   . Eczema   . GERD (gastroesophageal reflux disease)   . Hemorrhoid   . Low back pain   . Personal history of colonic polyps 03/03/2010  . Vitamin B12 deficiency    Past Surgical History:  Procedure Laterality Date  . CIRCUMCISION N/A 07/20/2013   Procedure: CIRCUMCISION ADULT;  Surgeon: Claybon Jabs, MD;  Location: [facility];  Service: Urology;  Laterality: N/A;  . CYSTO REMOVAL RIGHT THUMB  2006  . RIGHT HYDROCELECTOMY  11-13-2010   Social History   Social History  . Marital status: Single    Spouse name: N/A  . Number of children: N/A  . Years of education: N/A   Occupational History  . Hospital doctor Retired   Social History Main Topics  . Smoking status: Never Smoker  . Smokeless tobacco: Never Used  .  Alcohol use No  . Drug use: No  . Sexual activity: Yes   Other Topics Concern  . None   Social History Narrative   He was in the army; jumped off the plane many times   Regular exercise-yes bowling   Daily Caffeine Use-Tea   Allergies  Allergen Reactions  . Erythromycin Ethylsuccinate Anaphylaxis  . Doxycycline Nausea And Vomiting   Family History  Problem Relation Age of Onset  . Hypertension Mother   . Diabetes Mother   . Hypertension Father   . Diabetes Father     Past medical history, social, surgical and family history all reviewed in electronic medical record.  No pertanent information unless stated regarding to the chief complaint.   Review of Systems: No headache, visual changes, nausea, vomiting, diarrhea, constipation, dizziness, abdominal pain, skin rash, fevers, chills, night sweats, weight loss, swollen lymph nodes, body aches, joint swelling,, chest pain, shortness of breath, mood changes.  Positive for muscle aches  Objective  Blood pressure 108/80, pulse 70, resp. rate 16, weight 162 lb 6 oz (73.7 kg), SpO2 98 %.    Systems examined below as of 03/07/17 General: NAD A&O x3 mood, affect normal  HEENT: Pupils equal, extraocular movements intact no nystagmus Respiratory: not short of breath at rest or with speaking Cardiovascular: No lower extremity edema, non tender Skin: Warm dry intact with no signs of infection or rash on extremities or on axial skeleton. Abdomen: Soft  nontender, no masses Neuro: Cranial nerves  intact, neurovascularly intact in all extremities with 2+ DTRs and 2+ pulses. Lymph: No lymphadenopathy appreciated today  Gait normal with good balance and coordination.  MSK: Non tender with full range of motion and good stability and symmetric strength and tone of shoulders, elbows, wrist,  knee hips and ankles bilaterally.   Wrist: Right Inspection normal with no visible erythema or swelling. Patient does have increase bone hypertrophia of  the dorsal aspect of the wrist. This is different than previous exam. ROM smooth and normal with good flexion and extension and ulnar/radial deviation that is symmetrical with opposite wrist. There is minimal pain with complete extension of the wrist Palpation is normal over metacarpals, navicular, lunate, and TFCC; tendons without tenderness/ swelling she does have some mild tenderness over the first and second extensor compartments. No snuffbox tenderness. No tenderness over Canal of Guyon. Strength shows the patient does have weakness in the C7 C8 distribution Negative Finkelstein, mild positive Tinel still noted Negative Watson's test. Contralateral wrist unremarkable  Neck: Inspection unremarkable. No palpable stepoffs. Negative Spurling's maneuver. Full neck range of motion Grip strength and sensation normal in bilateral hands Strength good C4 to T1 distribution No sensory change to C4 to T1 Negative Hoffman sign bilaterally Reflexes normal  Neck: Inspection unremarkable. No palpable stepoffs. Negative Spurling's maneuver. Mild limited range of motion especially with right-sided side bending and rotation. Grip strength and sensation normal in bilateral hands Weakness in the C8 T1 distribution noted today. No sensory change to C4 to T1 Negative Hoffman sign bilaterally Reflexes normal   Impression and Recommendations:     This case required medical decision making of moderate complexity.      Note: This dictation was prepared with Dragon dictation along with smaller phrase technology. Any transcriptional errors that result from this process are unintentional.

## 2017-03-07 ENCOUNTER — Other Ambulatory Visit: Payer: Self-pay

## 2017-03-07 ENCOUNTER — Ambulatory Visit (INDEPENDENT_AMBULATORY_CARE_PROVIDER_SITE_OTHER): Payer: Federal, State, Local not specified - PPO | Admitting: Family Medicine

## 2017-03-07 ENCOUNTER — Encounter: Payer: Self-pay | Admitting: Family Medicine

## 2017-03-07 ENCOUNTER — Ambulatory Visit (INDEPENDENT_AMBULATORY_CARE_PROVIDER_SITE_OTHER)
Admission: RE | Admit: 2017-03-07 | Discharge: 2017-03-07 | Disposition: A | Discharge status: Home / Self Care | Payer: Federal, State, Local not specified - PPO | Source: Ambulatory Visit | Attending: Family Medicine | Admitting: Family Medicine

## 2017-03-07 VITALS — BP 108/80 | HR 70 | Resp 16 | Wt 162.4 lb

## 2017-03-07 DIAGNOSIS — R29898 Other symptoms and signs involving the musculoskeletal system: Secondary | ICD-10-CM | POA: Insufficient documentation

## 2017-03-07 DIAGNOSIS — R202 Paresthesia of skin: Secondary | ICD-10-CM | POA: Diagnosis not present

## 2017-03-07 DIAGNOSIS — M79643 Pain in unspecified hand: Secondary | ICD-10-CM

## 2017-03-07 DIAGNOSIS — M542 Cervicalgia: Secondary | ICD-10-CM

## 2017-03-07 DIAGNOSIS — M25531 Pain in right wrist: Secondary | ICD-10-CM | POA: Diagnosis not present

## 2017-03-07 DIAGNOSIS — M19041 Primary osteoarthritis, right hand: Secondary | ICD-10-CM | POA: Diagnosis not present

## 2017-03-07 MED ORDER — PREDNISONE 50 MG PO TABS: 50.0000 mg | ORAL_TABLET | Freq: Every day | ORAL | 0 refills | Status: DC

## 2017-03-07 NOTE — Patient Instructions (Signed)
Good to see you.  Ice 20 minutes 2 times daily. Usually after activity and before bed. Xray downstairs today  We will also get an EMG of the right arm to see where this nerve is firing or not.  We will do prednisone daily for 5 days Continue the gabapentin  See me again in 3 weeks.

## 2017-03-07 NOTE — Assessment & Plan Note (Signed)
Patient does have some right hand weakness noted. Patient is going to do home exercises, icing protocol, which activities to do a which ones to avoid. I do feel that patient's weakness is fairly severe. Given prednisone today that hopefully will help. In addition this x-rays the patient's neck as well as hand ordered today. Especially with patient's bony atrophy. In addition of this patient will have a EMG for further evaluation. Has carpal tunnel and I would like to make sure that there is no cervical radiculopathy that is contribute in. Depending on findings patient follow-up again in 3-4 weeks and we will discuss different treatment options.

## 2017-03-08 ENCOUNTER — Encounter: Payer: Self-pay | Admitting: Neurology

## 2017-03-08 ENCOUNTER — Other Ambulatory Visit: Payer: Self-pay | Admitting: *Deleted

## 2017-03-08 DIAGNOSIS — M79641 Pain in right hand: Secondary | ICD-10-CM

## 2017-03-15 ENCOUNTER — Encounter: Payer: Self-pay | Admitting: Neurology

## 2017-03-22 ENCOUNTER — Ambulatory Visit (INDEPENDENT_AMBULATORY_CARE_PROVIDER_SITE_OTHER): Payer: Federal, State, Local not specified - PPO | Admitting: Neurology

## 2017-03-22 DIAGNOSIS — G5601 Carpal tunnel syndrome, right upper limb: Secondary | ICD-10-CM

## 2017-03-22 DIAGNOSIS — M79641 Pain in right hand: Secondary | ICD-10-CM | POA: Diagnosis not present

## 2017-03-22 DIAGNOSIS — G5621 Lesion of ulnar nerve, right upper limb: Secondary | ICD-10-CM

## 2017-03-22 NOTE — Procedures (Signed)
[facility]  [address], [address]  [place], [place] [address] Tel: [phone] Fax:  [phone] Test Date:  03/22/2017  Patient: Jorge Turner DOB: 01/03/1956 Physician: Narda Amber, DO  Sex: Male Height: 5\' 10"  Ref Phys: Hulan Saas, D.O.  ID#: [id number]   Technician:    Patient Complaints: This is a 61 year-old gentleman referred for evaluation of right wrist pain, paresthesias, and hand weakness.  NCV & EMG Findings: Extensive electrodiagnostic testing of the right upper extremity shows:  1. Right median sensory response shows prolonged distal peak latency (5.2 ms).  Right ulnar sensory response is within normal limits. 2. Right median motor response shows prolonged distal onset latency (5.2 ms).  Right ulnar motor response shows decreased conduction velocity (A Elbow-B Elbow, 40 m/s), with normal latency and amplitude.   3. Sparse chronic motor axon loss changes are seen in the right first dorsal interosseous, abductor indicis brevis, and flexor carpi ulnaris muscles. There is no evidence of active denervation.   Impression: 1. Right median neuropathy at or distal to the wrist, consistent with the clinical diagnosis of carpal tunnel syndrome; moderate in degree electrically. 2. Right ulnar neuropathy with slowing across the elbow, purely demyelinating in type; mild in degree electrically. 3. There is no evidence of a right cervical radiculopathy.   ___________________________ Narda Amber, DO    Nerve Conduction Studies Anti Sensory Summary Table   Site NR Peak (ms) Norm Peak (ms) P-T Amp (V) Norm P-T Amp  Right Median Anti Sensory (2nd Digit)  34.8C  Wrist    5.2 <3.8 11.7 >10  Right Ulnar Anti Sensory (5th Digit)  34.8C  Wrist    2.5 <3.2 14.4 >5   Motor Summary Table   Site NR Onset (ms) Norm Onset (ms) O-P Amp (mV) Norm O-P Amp Site1 Site2 Delta-0 (ms) Dist (cm) Vel (m/s) Norm Vel (m/s)  Right Median Motor (Abd Poll Brev)  34.8C  Wrist    5.2  <4.0 6.4 >5 Elbow Wrist 4.8 29.0 60 >50  Elbow    10.0  5.8         Right Ulnar Motor (Abd Dig Minimi)  34.8C  Wrist    2.1 <3.1 9.4 >7 B Elbow Wrist 3.8 25.0 66 >50  B Elbow    5.9  9.4  A Elbow B Elbow 2.5 10.0 40 >50  A Elbow    8.4  8.7          EMG   Side Muscle Ins Act Fibs Psw Fasc Number Recrt Dur Dur. Amp Amp. Poly Poly. Comment  Right 1stDorInt Nml Nml Nml Nml 1- Rapid Few 1+ Few 1+ Nml Nml N/A  Right Abd Poll Brev Nml Nml Nml Nml 1- Rapid Few 1+ Few 1+ Nml Nml N/A  Right Ext Indicis Nml Nml Nml Nml Nml Nml Nml Nml Nml Nml Nml Nml N/A  Right PronatorTeres Nml Nml Nml Nml Nml Nml Nml Nml Nml Nml Nml Nml N/A  Right Biceps Nml Nml Nml Nml Nml Nml Nml Nml Nml Nml Nml Nml N/A  Right Triceps Nml Nml Nml Nml Nml Nml Nml Nml Nml Nml Nml Nml N/A  Right Deltoid Nml Nml Nml Nml Nml Nml Nml Nml Nml Nml Nml Nml N/A  Right ABD Dig Min Nml Nml Nml Nml 1- Rapid Few 1+ Few 1+ Nml Nml N/A  Right FlexCarpiUln Nml Nml Nml Nml Nml Nml Nml Nml Nml Nml Nml Nml N/A      Waveforms:

## 2017-03-29 ENCOUNTER — Encounter: Payer: Self-pay | Admitting: Family Medicine

## 2017-03-29 ENCOUNTER — Ambulatory Visit (INDEPENDENT_AMBULATORY_CARE_PROVIDER_SITE_OTHER): Payer: Federal, State, Local not specified - PPO | Admitting: Family Medicine

## 2017-03-29 DIAGNOSIS — G5601 Carpal tunnel syndrome, right upper limb: Secondary | ICD-10-CM

## 2017-03-29 NOTE — Patient Instructions (Signed)
Good to see you  Carpal tunnel syndrome is what the test shows.  We will try injection on Friday  Exercises 3 times a week.  See you Friday!

## 2017-03-29 NOTE — Progress Notes (Signed)
[facility] [place], [place] [address] Phone: [phone] Subjective:      CC: Right wrist pain, f/u  [id number]  Jorge Turner is a 61 y.o. male coming in with complaint of right wrist pain. Has had a history of carpal tunnel previously. Patient was having more of an extensor tendinitis. Seem to be getting somewhat better with conservative therapy. Patient unfortunately was having more numbness. Patient was having difficulty overall was sent for an EMG. EMG was independently visualized by me and showed the patient did have more of a carpal tunnel syndrome. Patient states that unfortunate seems to be worsening. Patient did not find to have any significant cervical radiculopathy.     patient previous imaging of the patient's wrist was in July 2017. Independently visualized by me showing no significant bony normality.  Past Medical History:  Diagnosis Date  . Balanitis   . BPH (benign prostatic hypertrophy)   . DJD (degenerative joint disease) of knee   . Eczema   . GERD (gastroesophageal reflux disease)   . Hemorrhoid   . Low back pain   . Personal history of colonic polyps 03/03/2010  . Vitamin B12 deficiency    Past Surgical History:  Procedure Laterality Date  . CIRCUMCISION N/A 07/20/2013   Procedure: CIRCUMCISION ADULT;  Surgeon: Claybon Jabs, MD;  Location: [facility];  Service: Urology;  Laterality: N/A;  . CYSTO REMOVAL RIGHT THUMB  2006  . RIGHT HYDROCELECTOMY  11-13-2010   Social History   Social History  . Marital status: Single    Spouse name: N/A  . Number of children: N/A  . Years of education: N/A   Occupational History  . Hospital doctor Retired   Social History Main Topics  . Smoking status: Never Smoker  . Smokeless tobacco: Never Used  . Alcohol use No  . Drug use: No  . Sexual activity: Yes   Other Topics Concern  . None   Social History Narrative   He was in the army;  jumped off the plane many times   Regular exercise-yes bowling   Daily Caffeine Use-Tea   Allergies  Allergen Reactions  . Erythromycin Ethylsuccinate Anaphylaxis  . Doxycycline Nausea And Vomiting   Family History  Problem Relation Age of Onset  . Hypertension Mother   . Diabetes Mother   . Hypertension Father   . Diabetes Father     Past medical history, social, surgical and family history all reviewed in electronic medical record.  No pertanent information unless stated regarding to the chief complaint.   Review of Systems: No headache, visual changes, nausea, vomiting, diarrhea, constipation, dizziness, abdominal pain, skin rash, fevers, chills, night sweats, weight loss, swollen lymph nodes, body aches, joint swelling, muscle aches, chest pain, shortness of breath, mood changes.    Objective  Blood pressure 120/72, pulse 60, resp. rate 16, weight 158 lb 9.6 oz (71.9 kg), SpO2 98 %.    Systems examined below as of 03/29/17 General: NAD A&O x3 mood, affect normal  HEENT: Pupils equal, extraocular movements intact no nystagmus Respiratory: not short of breath at rest or with speaking Cardiovascular: No lower extremity edema, non tender Skin: Warm dry intact with no signs of infection or rash on extremities or on axial skeleton. Abdomen: Soft nontender, no masses Neuro: Cranial nerves  intact, neurovascularly intact in all extremities with 2+ DTRs and 2+ pulses. Lymph: No lymphadenopathy appreciated today  Gait normal with good balance  and coordination.  MSK: Non tender with full range of motion and good stability and symmetric strength and tone of shoulders, elbows,  knee hips and ankles bilaterally.    Wrist:Right Inspection normal with no visible erythema or swelling. Still some mild bony Normality on the dorsal aspect of the wrist Palpation is normal over metacarpals, navicular, lunate, and TFCC; tendons without tenderness/ swelling No snuffbox tenderness. No tenderness  over Canal of Guyon. Strength 5/5 in all directions without pain. Negative Finkelstein, positive tinel's and phalens. Negative Watson's test.   Neck: Inspection unremarkable. No palpable stepoffs. Negative Spurling's maneuver. Mild limitation in range of motion lacking the last 10 in all planes Grip strength and sensation normal in bilateral hands Strength good C4 to T1 distribution No sensory change to C4 to T1 Negative Hoffman sign bilaterally Reflexes normal  Procedure note 79024; 15 minutes spent for Therapeutic exercises as stated in above notes.  This included exercises focusing on stretching, strengthening, with significant focus on eccentric aspects. Carpal Tunnel Syndrome: We discussed the anatomy involved, and that carpal tunnel syndrome primarily involves the median nerve, and this typically affects digits one through 3. We also discussed that mild cases of carpal tunnel syndrome are often improved with night splints, and it is very reasonable to consider a carpal tunnel injection. If the patient does have moderate to severe carpal tunnel syndrome based on NCV, then it is certainly reasonable to consider carpal tunnel release, which was discussed with the patient. We also discussed his severe carpal tunnel syndrome can lead to permanent nerve impairment even if released. At this point, the patient like to proceed conservatively.   Proper technique shown and discussed handout in great detail with ATC.  All questions were discussed and answered.     Impression and Recommendations:     This case required medical decision making of moderate complexity.      Note: This dictation was prepared with Dragon dictation along with smaller phrase technology. Any transcriptional errors that result from this process are unintentional.

## 2017-03-29 NOTE — Assessment & Plan Note (Signed)
Patient does have carpal tunnel syndrome. Discussed with patient at great length. We will do injection and a later date. Patient isn't to consider other the conservative therapy. We discussed icing regimen and patient work with Product/process development scientist to learn home exercises in greater detail. Spent  25 minutes with patient face-to-face and had greater than 50% of counseling including as described above in assessment and plan.

## 2017-04-01 ENCOUNTER — Ambulatory Visit: Payer: No Typology Code available for payment source | Admitting: Family Medicine

## 2017-04-17 ENCOUNTER — Other Ambulatory Visit: Payer: Self-pay | Admitting: Internal Medicine

## 2017-04-21 NOTE — Progress Notes (Deleted)
[facility] [name], [place] [address] Phone: [phone] Subjective:      CC: Right wrist pain, f/u  [id number]  Jorge Turner is a 61 y.o. male coming in with complaint of right wrist pain. Has had a history of carpal tunnel previously. Patient was having more of an extensor tendinitis. Seem to be getting somewhat better with conservative therapy. Patient unfortunately was having more numbness. Patient was having difficulty overall was sent for an EMG. EMG was independently visualized by me and showed the patient did have more of a carpal tunnel syndrome. Patient was seen in 1 to continue with conservative therapy. Patient states     Past Medical History:  Diagnosis Date  . Balanitis   . BPH (benign prostatic hypertrophy)   . DJD (degenerative joint disease) of knee   . Eczema   . GERD (gastroesophageal reflux disease)   . Hemorrhoid   . Low back pain   . Personal history of colonic polyps 03/03/2010  . Vitamin B12 deficiency    Past Surgical History:  Procedure Laterality Date  . CIRCUMCISION N/A 07/20/2013   Procedure: CIRCUMCISION ADULT;  Surgeon: Claybon Jabs, MD;  Location: [facility];  Service: Urology;  Laterality: N/A;  . CYSTO REMOVAL RIGHT THUMB  2006  . RIGHT HYDROCELECTOMY  11-13-2010   Social History   Social History  . Marital status: Single    Spouse name: N/A  . Number of children: N/A  . Years of education: N/A   Occupational History  . Hospital doctor Retired   Social History Main Topics  . Smoking status: Never Smoker  . Smokeless tobacco: Never Used  . Alcohol use No  . Drug use: No  . Sexual activity: Yes   Other Topics Concern  . Not on file   Social History Narrative   He was in the army; jumped off the plane many times   Regular exercise-yes bowling   Daily Caffeine Use-Tea   Allergies  Allergen Reactions  . Erythromycin Ethylsuccinate Anaphylaxis  .  Doxycycline Nausea And Vomiting   Family History  Problem Relation Age of Onset  . Hypertension Mother   . Diabetes Mother   . Hypertension Father   . Diabetes Father     Past medical history, social, surgical and family history all reviewed in electronic medical record.  No pertanent information unless stated regarding to the chief complaint.   Review of Systems: No headache, visual changes, nausea, vomiting, diarrhea, constipation, dizziness, abdominal pain, skin rash, fevers, chills, night sweats, weight loss, swollen lymph nodes, body aches, joint swelling, muscle aches, chest pain, shortness of breath, mood changes.    Objective  There were no vitals taken for this visit.    Systems examined below as of 04/21/17 General: NAD A&O x3 mood, affect normal  HEENT: Pupils equal, extraocular movements intact no nystagmus Respiratory: not short of breath at rest or with speaking Cardiovascular: No lower extremity edema, non tender Skin: Warm dry intact with no signs of infection or rash on extremities or on axial skeleton. Abdomen: Soft nontender, no masses Neuro: Cranial nerves  intact, neurovascularly intact in all extremities with 2+ DTRs and 2+ pulses. Lymph: No lymphadenopathy appreciated today  Gait normal with good balance and coordination.  MSK: Non tender with full range of motion and good stability and symmetric strength and tone of shoulders, elbows,  knee hips and ankles bilaterally.    Wrist:Right Inspection normal with  no visible erythema or swelling. Still some mild bony Normality on the dorsal aspect of the wrist Palpation is normal over metacarpals, navicular, lunate, and TFCC; tendons without tenderness/ swelling No snuffbox tenderness. No tenderness over Canal of Guyon. Strength 5/5 in all directions without pain. Negative Finkelstein, positive tinel's and phalens. Negative Watson's test.   Neck: Inspection unremarkable. No palpable stepoffs. Negative  Spurling's maneuver. Mild limitation in range of motion lacking the last 10 in all planes Grip strength and sensation normal in bilateral hands Strength good C4 to T1 distribution No sensory change to C4 to T1 Negative Hoffman sign bilaterally Reflexes normal  P    Impression and Recommendations:     This case required medical decision making of moderate complexity.      Note: This dictation was prepared with Dragon dictation along with smaller phrase technology. Any transcriptional errors that result from this process are unintentional.

## 2017-04-22 ENCOUNTER — Ambulatory Visit: Payer: Self-pay | Admitting: Family Medicine

## 2017-04-22 DIAGNOSIS — Z7689 Persons encountering health services in other specified circumstances: Secondary | ICD-10-CM

## 2017-07-19 ENCOUNTER — Encounter: Payer: Self-pay | Admitting: Internal Medicine

## 2017-07-19 ENCOUNTER — Ambulatory Visit (INDEPENDENT_AMBULATORY_CARE_PROVIDER_SITE_OTHER): Payer: Federal, State, Local not specified - PPO | Admitting: Internal Medicine

## 2017-07-19 ENCOUNTER — Other Ambulatory Visit: Payer: Self-pay | Admitting: Internal Medicine

## 2017-07-19 VITALS — BP 108/70 | HR 68 | Temp 98.8°F | Resp 16 | Ht 70.0 in | Wt 161.0 lb

## 2017-07-19 DIAGNOSIS — J988 Other specified respiratory disorders: Secondary | ICD-10-CM | POA: Insufficient documentation

## 2017-07-19 MED ORDER — CEFDINIR 300 MG PO CAPS: 300.0000 mg | ORAL_CAPSULE | Freq: Two times a day (BID) | ORAL | 0 refills | Status: DC

## 2017-07-19 NOTE — Progress Notes (Signed)
Subjective:  Patient ID: Jorge Turner, male    DOB: 1956-04-27  Age: 61 y.o. MRN: [id number]  CC: Sinusitis   HPI Jorge Turner presents for a 10 day hx of facial pain, runny nose, NP cough, and chills but no fever.  Outpatient Medications Prior to Visit  Medication Sig Dispense Refill  . ANUSOL-HC 2.5 % rectal cream APPLY TO AFFECTED AREA TWICE A DAY AS NEEDED 30 g 0  . cholecalciferol (VITAMIN D) 1000 UNITS tablet Take 1,000 Units by mouth daily.      . Cyanocobalamin (VITAMIN B-12) 1000 MCG SUBL Place 1 tablet (1,000 mcg total) under the tongue daily. 100 tablet 3  . Diclofenac Sodium 2 % SOLN Apply 1 pump twice daily. 112 g 3  . finasteride (PROSCAR) 5 MG tablet Take 1 tablet (5 mg total) by mouth daily. 90 tablet 3  . fluticasone furoate-vilanterol (BREO ELLIPTA) 100-25 MCG/INH AEPB Inhale 1 puff into the lungs daily. 1 each 5  . gabapentin (NEURONTIN) 100 MG capsule Take 1 capsule (100 mg total) by mouth 3 (three) times daily as needed. 270 capsule 1  . hydrOXYzine (ATARAX/VISTARIL) 25 MG tablet TAKE 1-2 TABLETS (25-50 MG TOTAL) BY MOUTH AT BEDTIME AS NEEDED FOR ITCHING. 180 tablet 0  . ibuprofen (ADVIL,MOTRIN) 600 MG tablet TAKE 1 TABLET BY MOUTH TWICE A DAY 180 tablet 0  . Ibuprofen-Famotidine (DUEXIS) 800-26.6 MG TABS Take 1 tablet by mouth 2 (two) times daily as needed. 60 tablet 3  . ketoconazole (NIZORAL) 2 % cream Apply topically as needed.    . mupirocin ointment (BACTROBAN) 2 % Applied twice a day to the affected area;NOT into eyes. 15 g 0  . omeprazole (PRILOSEC) 40 MG capsule Take 1 capsule (40 mg total) by mouth daily. 90 capsule 3  . traMADol (ULTRAM) 50 MG tablet Take 1-2 tablets (50-100 mg total) by mouth every 6 (six) hours as needed. 60 tablet 3  . triamcinolone ointment (KENALOG) 0.5 % Apply 1 application topically 2 (two) times daily. 45 g 3  . valACYclovir (VALTREX) 1000 MG tablet Take 2t po q12h times 2 doses. Repeat with next fever blister episode. 20 tablet 3    . predniSONE (DELTASONE) 50 MG tablet Take 1 tablet (50 mg total) by mouth daily. 5 tablet 0   No facility-administered medications prior to visit.     ROS Review of Systems  Constitutional: Positive for chills. Negative for fatigue and fever.  HENT: Positive for congestion, postnasal drip, rhinorrhea, sinus pain and sinus pressure. Negative for facial swelling, sore throat, trouble swallowing and voice change.   Eyes: Negative.   Respiratory: Positive for cough. Negative for chest tightness, shortness of breath and wheezing.   Cardiovascular: Negative for chest pain, palpitations and leg swelling.  Gastrointestinal: Negative.  Negative for abdominal pain, constipation, diarrhea, nausea and vomiting.  Endocrine: Negative.   Genitourinary: Negative.  Negative for difficulty urinating.  Musculoskeletal: Negative.  Negative for back pain and myalgias.  Skin: Negative.  Negative for color change and rash.  Allergic/Immunologic: Negative.   Neurological: Negative.  Negative for dizziness and weakness.  Hematological: Negative for adenopathy. Does not bruise/bleed easily.  Psychiatric/Behavioral: Negative.     Objective:  BP 108/70 (BP Location: Left Arm, Patient Position: Sitting, Cuff Size: Normal)   Pulse 68   Temp 98.8 F (37.1 C) (Oral)   Resp 16   Ht 5\' 10"  (1.778 m)   Wt 161 lb (73 kg)   SpO2 99%   BMI 23.10  kg/m   BP Readings from Last 3 Encounters:  07/19/17 108/70  03/29/17 120/72  03/07/17 108/80    Wt Readings from Last 3 Encounters:  07/19/17 161 lb (73 kg)  03/29/17 158 lb 9.6 oz (71.9 kg)  03/07/17 162 lb 6 oz (73.7 kg)    Physical Exam  Constitutional: He is oriented to person, place, and time.  Non-toxic appearance. He does not have a sickly appearance. He does not appear ill. No distress.  HENT:  Right Ear: Hearing, tympanic membrane, external ear and ear canal normal.  Left Ear: Hearing, tympanic membrane, external ear and ear canal normal.  Nose: No  rhinorrhea or sinus tenderness. Right sinus exhibits no maxillary sinus tenderness and no frontal sinus tenderness. Left sinus exhibits no maxillary sinus tenderness and no frontal sinus tenderness.  Mouth/Throat: Oropharynx is clear and moist and mucous membranes are normal. Mucous membranes are not pale, not dry and not cyanotic. No oral lesions. No trismus in the jaw. No uvula swelling. No oropharyngeal exudate, posterior oropharyngeal edema, posterior oropharyngeal erythema or tonsillar abscesses.  Eyes: Conjunctivae are normal. Right eye exhibits no discharge. Left eye exhibits no discharge. No scleral icterus.  Neck: Normal range of motion. Neck supple. No JVD present. No thyromegaly present.  Cardiovascular: Normal rate, regular rhythm and intact distal pulses.  Exam reveals no gallop and no friction rub.   No murmur heard. Pulmonary/Chest: Effort normal and breath sounds normal. No respiratory distress. He has no wheezes. He has no rales. He exhibits no tenderness.  Abdominal: Soft. Bowel sounds are normal. He exhibits no distension and no mass. There is no tenderness. There is no rebound and no guarding.  Musculoskeletal: Normal range of motion. He exhibits no edema, tenderness or deformity.  Lymphadenopathy:    He has no cervical adenopathy.  Neurological: He is alert and oriented to person, place, and time.  Skin: Skin is warm and dry. No rash noted. He is not diaphoretic. No erythema. No pallor.  Vitals reviewed.   Lab Results  Component Value Date   WBC 4.0 11/11/2016   HGB 13.8 11/11/2016   HCT 40.8 11/11/2016   PLT 268.0 11/11/2016   GLUCOSE 100 (H) 11/11/2016   CHOL 218 (H) 11/11/2016   TRIG 127.0 11/11/2016   HDL 55.50 11/11/2016   LDLDIRECT 140.3 10/26/2011   LDLCALC 137 (H) 11/11/2016   ALT 15 06/11/2013   AST 16 06/11/2013   NA 139 11/11/2016   K 4.6 11/11/2016   CL 104 11/11/2016   CREATININE 1.38 11/11/2016   BUN 26 (H) 11/11/2016   CO2 30 11/11/2016   TSH  2.94 11/11/2016   PSA 5.28 (H) 11/11/2016    Dg Cervical Spine Complete  Result Date: 03/08/2017 CLINICAL DATA:  Intermittent right upper extremity numbness EXAM: CERVICAL SPINE - COMPLETE 4+ VIEW COMPARISON:  Cervical MRI May 30, 2015 FINDINGS: Frontal, lateral, open-mouth odontoid, and bilateral oblique views were obtained. There is no fracture or spondylolisthesis. Prevertebral soft tissues and predental space regions are normal. There is moderately severe disc space narrowing at C5-6. There are anterior osteophytes at C5 and C6. The disc spaces appear unremarkable. There is facet hypertrophy with exit foraminal narrowing at C5-6 bilaterally and to a lesser extent at C4-5 bilaterally. No erosive change. Lung apices are clear. IMPRESSION: Osteoarthritic change, primarily at C5-6. No fracture or spondylolisthesis. Electronically Signed   By: Lowella Grip III M.D.   On: 03/08/2017 08:14   Dg Wrist Complete Right  Result Date: 03/08/2017 CLINICAL  DATA:  Chronic pain EXAM: RIGHT WRIST - COMPLETE 3+ VIEW COMPARISON:  January 01, 2010 FINDINGS: Frontal, oblique lateral, and ulnar deviation scaphoid images were obtained. There is evidence of old trauma involving the ulnar styloid with remodeling. There is no evident acute fracture or dislocation. Joint spaces appear normal. No erosive change or periostitis. IMPRESSION: Evidence of old trauma involving the ulnar styloid with remodeling. No acute fracture or dislocation. No appreciable arthropathic change. Electronically Signed   By: Lowella Grip III M.D.   On: 03/08/2017 08:17   Dg Hand Complete Right  Result Date: 03/08/2017 CLINICAL DATA:  Intermittent right hand numbness. Pain second digit for 2 weeks EXAM: RIGHT HAND - COMPLETE 3+ VIEW COMPARISON:  None. FINDINGS: Frontal, oblique, and lateral views were obtained it. There is no acute fracture or dislocation. There is evidence of old trauma in the ulnar styloid region with remodeling. There is  osteoarthritic change in the third and fourth DIP and fourth PIP joints. No erosive change or periostitis. IMPRESSION: Osteoarthritic change in several distal joints. Old trauma involving the ulnar styloid with remodeling. No acute fracture or dislocation. No erosion. Electronically Signed   By: Lowella Grip III M.D.   On: 03/08/2017 08:15    Assessment & Plan:   Benney was seen today for sinusitis.  Diagnoses and all orders for this visit:  RTI (respiratory tract infection)- will treat the infection with omnicef -     cefdinir (OMNICEF) 300 MG capsule; Take 1 capsule (300 mg total) by mouth 2 (two) times daily.   I have discontinued Mr. Rendell predniSONE. I am also having him start on cefdinir. Additionally, I am having him maintain his cholecalciferol, Vitamin B-12, ketoconazole, mupirocin ointment, gabapentin, ANUSOL-HC, valACYclovir, Diclofenac Sodium, traMADol, finasteride, Ibuprofen-Famotidine, triamcinolone ointment, hydrOXYzine, fluticasone furoate-vilanterol, omeprazole, and ibuprofen.  Meds ordered this encounter  Medications  . cefdinir (OMNICEF) 300 MG capsule    Sig: Take 1 capsule (300 mg total) by mouth 2 (two) times daily.    Dispense:  20 capsule    Refill:  0     Follow-up: Return in about 3 weeks (around 08/09/2017).  Scarlette Calico, MD

## 2017-07-19 NOTE — Telephone Encounter (Signed)
Routing to dr plotnikov, please advise, thanks 

## 2017-07-19 NOTE — Patient Instructions (Signed)
Cough, Adult Coughing is a reflex that clears your throat and your airways. Coughing helps to heal and protect your lungs. It is normal to cough occasionally, but a cough that happens with other symptoms or lasts a long time may be a sign of a condition that needs treatment. A cough may last only 2-3 weeks (acute), or it may last longer than 8 weeks (chronic). What are the causes? Coughing is commonly caused by:  Breathing in substances that irritate your lungs.  A viral or bacterial respiratory infection.  Allergies.  Asthma.  Postnasal drip.  Smoking.  Acid backing up from the stomach into the esophagus (gastroesophageal reflux).  Certain medicines.  Chronic lung problems, including COPD (or rarely, lung cancer).  Other medical conditions such as heart failure.  Follow these instructions at home: Pay attention to any changes in your symptoms. Take these actions to help with your discomfort:  Take medicines only as told by your health care provider. ? If you were prescribed an antibiotic medicine, take it as told by your health care provider. Do not stop taking the antibiotic even if you start to feel better. ? Talk with your health care provider before you take a cough suppressant medicine.  Drink enough fluid to keep your urine clear or pale yellow.  If the air is dry, use a cold steam vaporizer or humidifier in your bedroom or your home to help loosen secretions.  Avoid anything that causes you to cough at work or at home.  If your cough is worse at night, try sleeping in a semi-upright position.  Avoid cigarette smoke. If you smoke, quit smoking. If you need help quitting, ask your health care provider.  Avoid caffeine.  Avoid alcohol.  Rest as needed.  Contact a health care provider if:  You have new symptoms.  You cough up pus.  Your cough does not get better after 2-3 weeks, or your cough gets worse.  You cannot control your cough with suppressant  medicines and you are losing sleep.  You develop pain that is getting worse or pain that is not controlled with pain medicines.  You have a fever.  You have unexplained weight loss.  You have night sweats. Get help right away if:  You cough up blood.  You have difficulty breathing.  Your heartbeat is very fast. This information is not intended to replace advice given to you by your health care provider. Make sure you discuss any questions you have with your health care provider. Document Released: 05/14/2011 Document Revised: 04/22/2016 Document Reviewed: 01/22/2015 Elsevier Interactive Patient Education  2017 [employer]  

## 2017-08-10 ENCOUNTER — Encounter: Payer: Self-pay | Admitting: Internal Medicine

## 2017-08-10 ENCOUNTER — Other Ambulatory Visit: Payer: Self-pay | Admitting: Internal Medicine

## 2017-08-10 ENCOUNTER — Ambulatory Visit (INDEPENDENT_AMBULATORY_CARE_PROVIDER_SITE_OTHER): Payer: Federal, State, Local not specified - PPO | Admitting: Internal Medicine

## 2017-08-10 ENCOUNTER — Other Ambulatory Visit (INDEPENDENT_AMBULATORY_CARE_PROVIDER_SITE_OTHER): Payer: Federal, State, Local not specified - PPO

## 2017-08-10 ENCOUNTER — Ambulatory Visit (INDEPENDENT_AMBULATORY_CARE_PROVIDER_SITE_OTHER)
Admission: RE | Admit: 2017-08-10 | Discharge: 2017-08-10 | Disposition: A | Discharge status: Home / Self Care | Payer: Federal, State, Local not specified - PPO | Source: Ambulatory Visit | Attending: Internal Medicine | Admitting: Internal Medicine

## 2017-08-10 DIAGNOSIS — R1031 Right lower quadrant pain: Secondary | ICD-10-CM

## 2017-08-10 DIAGNOSIS — R935 Abnormal findings on diagnostic imaging of other abdominal regions, including retroperitoneum: Secondary | ICD-10-CM

## 2017-08-10 LAB — SEDIMENTATION RATE: SED RATE: 1 mm/h (ref 0–20)

## 2017-08-10 LAB — CBC WITH DIFFERENTIAL/PLATELET
BASOS PCT: 0.4 % (ref 0.0–3.0)
Basophils Absolute: 0 10*3/uL (ref 0.0–0.1)
EOS ABS: 0.1 10*3/uL (ref 0.0–0.7)
EOS PCT: 2.1 % (ref 0.0–5.0)
HCT: 43.6 % (ref 39.0–52.0)
Hemoglobin: 14.2 g/dL (ref 13.0–17.0)
LYMPHS ABS: 0.9 10*3/uL (ref 0.7–4.0)
Lymphocytes Relative: 29.2 % (ref 12.0–46.0)
MCHC: 32.4 g/dL (ref 30.0–36.0)
MCV: 91.9 fl (ref 78.0–100.0)
Monocytes Absolute: 0.4 10*3/uL (ref 0.1–1.0)
Monocytes Relative: 14.4 % — ABNORMAL HIGH (ref 3.0–12.0)
NEUTROS PCT: 53.9 % (ref 43.0–77.0)
Neutro Abs: 1.7 10*3/uL (ref 1.4–7.7)
Platelets: 238 10*3/uL (ref 150.0–400.0)
RBC: 4.75 Mil/uL (ref 4.22–5.81)
RDW: 13.5 % (ref 11.5–15.5)
WBC: 3.1 10*3/uL — ABNORMAL LOW (ref 4.0–10.5)

## 2017-08-10 LAB — URINALYSIS
BILIRUBIN URINE: NEGATIVE
Hgb urine dipstick: NEGATIVE
KETONES UR: NEGATIVE
Leukocytes, UA: NEGATIVE
NITRITE: NEGATIVE
PH: 6 (ref 5.0–8.0)
SPECIFIC GRAVITY, URINE: 1.02 (ref 1.000–1.030)
Total Protein, Urine: NEGATIVE
Urine Glucose: NEGATIVE
Urobilinogen, UA: 0.2 (ref 0.0–1.0)

## 2017-08-10 LAB — BASIC METABOLIC PANEL
BUN: 24 mg/dL — AB (ref 6–23)
CHLORIDE: 109 meq/L (ref 96–112)
CO2: 26 mEq/L (ref 19–32)
CREATININE: 1.42 mg/dL (ref 0.40–1.50)
Calcium: 9.5 mg/dL (ref 8.4–10.5)
GFR: 65.29 mL/min (ref >60.00)
GLUCOSE: 89 mg/dL (ref 70–99)
POTASSIUM: 4.7 meq/L (ref 3.5–5.1)
Sodium: 142 mEq/L (ref 135–145)

## 2017-08-10 LAB — HEPATIC FUNCTION PANEL
ALT: 11 U/L (ref 0–53)
AST: 12 U/L (ref 0–37)
Albumin: 4.1 g/dL (ref 3.5–5.2)
Alkaline Phosphatase: 78 U/L (ref 39–117)
BILIRUBIN TOTAL: 0.5 mg/dL (ref 0.2–1.2)
Bilirubin, Direct: 0.1 mg/dL (ref 0.0–0.3)
Total Protein: 6.8 g/dL (ref 6.0–8.3)

## 2017-08-10 MED ORDER — IOPAMIDOL (ISOVUE-300) INJECTION 61%
100.0000 mL | Freq: Once | INTRAVENOUS | Status: AC | PRN
  Administered 2017-08-10: 100 mL via INTRAVENOUS

## 2017-08-10 NOTE — Progress Notes (Signed)
Subjective:  Patient ID: Jorge Turner, male    DOB: [birth year]  Age: 61 y.o. MRN: [id number]  CC: No chief complaint on file.   HPI Jorge Turner presents for R abd pain x 3-4 days. Now abd pain is more in the RLQ starting this am - he was doubled over.  Outpatient Medications Prior to Visit  Medication Sig Dispense Refill  . ANUSOL-HC 2.5 % rectal cream APPLY TO AFFECTED AREA TWICE A DAY AS NEEDED 30 g 0  . cholecalciferol (VITAMIN D) 1000 UNITS tablet Take 1,000 Units by mouth daily.      . Cyanocobalamin (VITAMIN B-12) 1000 MCG SUBL Place 1 tablet (1,000 mcg total) under the tongue daily. 100 tablet 3  . Diclofenac Sodium 2 % SOLN Apply 1 pump twice daily. 112 g 3  . finasteride (PROSCAR) 5 MG tablet Take 1 tablet (5 mg total) by mouth daily. 90 tablet 3  . fluticasone furoate-vilanterol (BREO ELLIPTA) 100-25 MCG/INH AEPB Inhale 1 puff into the lungs daily. 1 each 5  . gabapentin (NEURONTIN) 100 MG capsule Take 1 capsule (100 mg total) by mouth 3 (three) times daily as needed. 270 capsule 1  . hydrOXYzine (ATARAX/VISTARIL) 25 MG tablet TAKE 1-2 TABLETS (25-50 MG TOTAL) BY MOUTH AT BEDTIME AS NEEDED FOR ITCHING. 180 tablet 0  . ibuprofen (ADVIL,MOTRIN) 600 MG tablet TAKE 1 TABLET BY MOUTH TWICE A DAY 180 tablet 0  . Ibuprofen-Famotidine (DUEXIS) 800-26.6 MG TABS Take 1 tablet by mouth 2 (two) times daily as needed. 60 tablet 3  . ketoconazole (NIZORAL) 2 % cream Apply topically as needed.    . mupirocin ointment (BACTROBAN) 2 % Applied twice a day to the affected area;NOT into eyes. 15 g 0  . omeprazole (PRILOSEC) 40 MG capsule Take 1 capsule (40 mg total) by mouth daily. 90 capsule 3  . traMADol (ULTRAM) 50 MG tablet Take 1-2 tablets (50-100 mg total) by mouth every 6 (six) hours as needed. 60 tablet 3  . triamcinolone ointment (KENALOG) 0.5 % Apply 1 application topically 2 (two) times daily. 45 g 3  . valACYclovir (VALTREX) 1000 MG tablet Take 2t po q12h times 2 doses. Repeat with next  fever blister episode. 20 tablet 3  . cefdinir (OMNICEF) 300 MG capsule Take 1 capsule (300 mg total) by mouth 2 (two) times daily. (Patient not taking: Reported on 08/10/2017) 20 capsule 0   No facility-administered medications prior to visit.     ROS Review of Systems  Constitutional: Negative for appetite change, fatigue and unexpected weight change.  HENT: Negative for congestion, nosebleeds, sneezing, sore throat and trouble swallowing.   Eyes: Negative for itching and visual disturbance.  Respiratory: Negative for cough.   Cardiovascular: Negative for chest pain, palpitations and leg swelling.  Gastrointestinal: Positive for abdominal pain. Negative for abdominal distention, blood in stool, constipation, diarrhea, nausea and vomiting.  Genitourinary: Negative for frequency and hematuria.  Musculoskeletal: Negative for back pain, gait problem, joint swelling and neck pain.  Skin: Negative for rash.  Neurological: Negative for dizziness, tremors, speech difficulty and weakness.  Psychiatric/Behavioral: Negative for agitation, dysphoric mood and sleep disturbance. The patient is not nervous/anxious.     Objective:  BP 106/70 (BP Location: Left Arm, Patient Position: Sitting, Cuff Size: Normal)   Pulse 60   Temp 98 F (36.7 C) (Oral)   Ht 5\' 10"  (1.778 m)   Wt 166 lb (75.3 kg)   SpO2 99%   BMI 23.82 kg/m   BP  Readings from Last 3 Encounters:  08/10/17 106/70  07/19/17 108/70  03/29/17 120/72    Wt Readings from Last 3 Encounters:  08/10/17 166 lb (75.3 kg)  07/19/17 161 lb (73 kg)  03/29/17 158 lb 9.6 oz (71.9 kg)    Physical Exam  Constitutional: He is oriented to person, place, and time. He appears well-developed. No distress.  NAD  HENT:  Mouth/Throat: Oropharynx is clear and moist.  Eyes: Pupils are equal, round, and reactive to light. Conjunctivae are normal.  Neck: Normal range of motion. No JVD present. No thyromegaly present.  Cardiovascular: Normal rate,  regular rhythm, normal heart sounds and intact distal pulses.  Exam reveals no gallop and no friction rub.   No murmur heard. Pulmonary/Chest: Effort normal and breath sounds normal. No respiratory distress. He has no wheezes. He has no rales. He exhibits no tenderness.  Abdominal: Soft. Bowel sounds are normal. He exhibits no distension and no mass. There is no tenderness. There is no rebound and no guarding.  Musculoskeletal: Normal range of motion. He exhibits no edema or tenderness.  Lymphadenopathy:    He has no cervical adenopathy.  Neurological: He is alert and oriented to person, place, and time. He has normal reflexes. No cranial nerve deficit. He exhibits normal muscle tone. He displays a negative Romberg sign. Coordination and gait normal.  Skin: Skin is warm and dry. No rash noted.  Psychiatric: He has a normal mood and affect. His behavior is normal. Judgment and thought content normal.  RLQ - tender; no rebound  Lab Results  Component Value Date   WBC 4.0 11/11/2016   HGB 13.8 11/11/2016   HCT 40.8 11/11/2016   PLT 268.0 11/11/2016   GLUCOSE 100 (H) 11/11/2016   CHOL 218 (H) 11/11/2016   TRIG 127.0 11/11/2016   HDL 55.50 11/11/2016   LDLDIRECT 140.3 10/26/2011   LDLCALC 137 (H) 11/11/2016   ALT 15 06/11/2013   AST 16 06/11/2013   NA 139 11/11/2016   K 4.6 11/11/2016   CL 104 11/11/2016   CREATININE 1.38 11/11/2016   BUN 26 (H) 11/11/2016   CO2 30 11/11/2016   TSH 2.94 11/11/2016   PSA 5.28 (H) 11/11/2016    Dg Cervical Spine Complete  Result Date: 03/08/2017 CLINICAL DATA:  Intermittent right upper extremity numbness EXAM: CERVICAL SPINE - COMPLETE 4+ VIEW COMPARISON:  Cervical MRI May 30, 2015 FINDINGS: Frontal, lateral, open-mouth odontoid, and bilateral oblique views were obtained. There is no fracture or spondylolisthesis. Prevertebral soft tissues and predental space regions are normal. There is moderately severe disc space narrowing at C5-6. There are  anterior osteophytes at C5 and C6. The disc spaces appear unremarkable. There is facet hypertrophy with exit foraminal narrowing at C5-6 bilaterally and to a lesser extent at C4-5 bilaterally. No erosive change. Lung apices are clear. IMPRESSION: Osteoarthritic change, primarily at C5-6. No fracture or spondylolisthesis. Electronically Signed   By: Lowella Grip III M.D.   On: 03/08/2017 08:14   Dg Wrist Complete Right  Result Date: 03/08/2017 CLINICAL DATA:  Chronic pain EXAM: RIGHT WRIST - COMPLETE 3+ VIEW COMPARISON:  January 01, 2010 FINDINGS: Frontal, oblique lateral, and ulnar deviation scaphoid images were obtained. There is evidence of old trauma involving the ulnar styloid with remodeling. There is no evident acute fracture or dislocation. Joint spaces appear normal. No erosive change or periostitis. IMPRESSION: Evidence of old trauma involving the ulnar styloid with remodeling. No acute fracture or dislocation. No appreciable arthropathic change. Electronically Signed  By: Lowella Grip III M.D.   On: 03/08/2017 08:17   Dg Hand Complete Right  Result Date: 03/08/2017 CLINICAL DATA:  Intermittent right hand numbness. Pain second digit for 2 weeks EXAM: RIGHT HAND - COMPLETE 3+ VIEW COMPARISON:  None. FINDINGS: Frontal, oblique, and lateral views were obtained it. There is no acute fracture or dislocation. There is evidence of old trauma in the ulnar styloid region with remodeling. There is osteoarthritic change in the third and fourth DIP and fourth PIP joints. No erosive change or periostitis. IMPRESSION: Osteoarthritic change in several distal joints. Old trauma involving the ulnar styloid with remodeling. No acute fracture or dislocation. No erosion. Electronically Signed   By: Lowella Grip III M.D.   On: 03/08/2017 08:15    Assessment & Plan:   There are no diagnoses linked to this encounter. I have discontinued Mr. Krist cefdinir. I am also having him maintain his  cholecalciferol, Vitamin B-12, ketoconazole, mupirocin ointment, gabapentin, ANUSOL-HC, valACYclovir, Diclofenac Sodium, traMADol, finasteride, Ibuprofen-Famotidine, triamcinolone ointment, hydrOXYzine, fluticasone furoate-vilanterol, omeprazole, and ibuprofen.  No orders of the defined types were placed in this encounter.    Follow-up: No Follow-up on file.  Walker Kehr, MD

## 2017-08-10 NOTE — Assessment & Plan Note (Signed)
Labs UA CT abd today To ER if problems

## 2017-08-10 NOTE — Patient Instructions (Signed)
Go to ER if worse 

## 2017-08-14 ENCOUNTER — Other Ambulatory Visit: Payer: Self-pay | Admitting: Internal Medicine

## 2017-08-16 ENCOUNTER — Telehealth: Payer: Self-pay | Admitting: Internal Medicine

## 2017-08-16 NOTE — Telephone Encounter (Signed)
Pt scheduled to see Ellouise Newer PA 08/22/17@1 :15pm. Please notify pt of appt.

## 2017-08-16 NOTE — Telephone Encounter (Signed)
Patient has been notified of this appointment and plans to be here 08/22/17 at 1:15pm.

## 2017-08-22 ENCOUNTER — Ambulatory Visit (INDEPENDENT_AMBULATORY_CARE_PROVIDER_SITE_OTHER): Payer: Federal, State, Local not specified - PPO | Admitting: Physician Assistant

## 2017-08-22 ENCOUNTER — Encounter: Payer: Self-pay | Admitting: Physician Assistant

## 2017-08-22 VITALS — BP 110/60 | HR 78 | Ht 70.0 in | Wt 162.0 lb

## 2017-08-22 DIAGNOSIS — R938 Abnormal findings on diagnostic imaging of other specified body structures: Secondary | ICD-10-CM | POA: Diagnosis not present

## 2017-08-22 DIAGNOSIS — K219 Gastro-esophageal reflux disease without esophagitis: Secondary | ICD-10-CM | POA: Diagnosis not present

## 2017-08-22 DIAGNOSIS — R1031 Right lower quadrant pain: Secondary | ICD-10-CM

## 2017-08-22 DIAGNOSIS — R9389 Abnormal findings on diagnostic imaging of other specified body structures: Secondary | ICD-10-CM

## 2017-08-22 MED ORDER — NA SULFATE-K SULFATE-MG SULF 17.5-3.13-1.6 GM/177ML PO SOLN: 1.0000 | ORAL | 0 refills | Status: DC

## 2017-08-22 MED ORDER — HYOSCYAMINE SULFATE 0.125 MG SL SUBL: 0.1250 mg | SUBLINGUAL_TABLET | SUBLINGUAL | 2 refills | Status: DC | PRN

## 2017-08-22 NOTE — Progress Notes (Signed)
Chief Complaint: Abnormal CT the abdomen, right lower quadrant pain  HPI:  Jorge Turner is a 61 year old African-American male with a past medical history as listed below, who was referred to me by Plotnikov, [name], MD for a complaint of abnormal CT the abdomen and pelvis as well as right lower quadrant pain .     Patient follows with Dr. Henrene Pastor and was last seen 03/16/10 for a colonoscopy. This revealed severe diverticulosis throughout the colon, diminutive polyp in the ascending colon and internal hemorrhoids. Polyp was benign and patient was told to repeat in 10 years.    Review of chart shows a CT abdomen pelvis with contrast completed 08/10/17 which revealed normal appendix, small cysts and nonobstructing calculus in the left kidney, scattered colonic diverticulosis without definitive evidence of diverticulitis, cannot exclude 11 mm diameter nodule/polyp at cecum and a prostatic enlargement.   Today, the patient presents to clinic and explains that he started with severe right lower quadrant abdominal pain on 08/10/17. This seemed to come and go like "a contraction might", he describes that it would build in intensity and then release and then build again. This was so severe that it would bend him over. He rates it as a 10/10 at that time. He had CT as above. Since then, the patient has continued with a somewhat "crampy feeling" in the right lower quadrant, but this is nothing like it was previously. This tends to come and go and is maybe rated at a 1-2/10 currently. Associated symptoms include nausea. Patient denies a change in bowel habits.    Patient also describes his chronic reflux today for which he is now taking Omeprazole 40 mg as needed. Patient tells me he is able to control his symptoms with diet and "time of what I eat", but if he knows he is going to eat something which bothers him more eat really late, then he will take an Omeprazole beforehand which stops his symptoms before they start.    Patient denies fever, chills, blood in his stool, melena, change in bowel habits, weight loss, fatigue, vomiting or symptoms that awaken him at night.  Past Medical History:  Diagnosis Date  . Balanitis   . BPH (benign prostatic hypertrophy)   . DJD (degenerative joint disease) of knee   . Eczema   . GERD (gastroesophageal reflux disease)   . Hemorrhoid   . Low back pain   . Personal history of colonic polyps 03/03/2010  . Vitamin B12 deficiency     Past Surgical History:  Procedure Laterality Date  . CIRCUMCISION N/A 07/20/2013   Procedure: CIRCUMCISION ADULT;  Surgeon: Claybon Jabs, MD;  Location: [facility];  Service: Urology;  Laterality: N/A;  . CYSTO REMOVAL RIGHT THUMB  2006  . RIGHT HYDROCELECTOMY  11-13-2010    Current Outpatient Prescriptions  Medication Sig Dispense Refill  . ANUSOL-HC 2.5 % rectal cream APPLY TO AFFECTED AREA TWICE A DAY AS NEEDED 30 g 0  . BREO ELLIPTA 100-25 MCG/INH AEPB INHALE 1 PUFF INTO THE LUNGS EVERY DAY 60 each 11  . cholecalciferol (VITAMIN D) 1000 UNITS tablet Take 1,000 Units by mouth daily.      . Cyanocobalamin (VITAMIN B-12) 1000 MCG SUBL Place 1 tablet (1,000 mcg total) under the tongue daily. 100 tablet 3  . Diclofenac Sodium 2 % SOLN Apply 1 pump twice daily. 112 g 3  . finasteride (PROSCAR) 5 MG tablet Take 1 tablet (5 mg total) by mouth daily. [place]  tablet 3  . gabapentin (NEURONTIN) 100 MG capsule Take 1 capsule (100 mg total) by mouth 3 (three) times daily as needed. 270 capsule 1  . hydrOXYzine (ATARAX/VISTARIL) 25 MG tablet TAKE 1-2 TABLETS (25-50 MG TOTAL) BY MOUTH AT BEDTIME AS NEEDED FOR ITCHING. 180 tablet 0  . ibuprofen (ADVIL,MOTRIN) 600 MG tablet TAKE 1 TABLET BY MOUTH TWICE A DAY 180 tablet 0  . Ibuprofen-Famotidine (DUEXIS) 800-26.6 MG TABS Take 1 tablet by mouth 2 (two) times daily as needed. 60 tablet 3  . ketoconazole (NIZORAL) 2 % cream Apply topically as needed.    . mupirocin ointment (BACTROBAN) 2 %  Applied twice a day to the affected area;NOT into eyes. 15 g 0  . omeprazole (PRILOSEC) 40 MG capsule Take 1 capsule (40 mg total) by mouth daily. 90 capsule 3  . traMADol (ULTRAM) 50 MG tablet Take 1-2 tablets (50-100 mg total) by mouth every 6 (six) hours as needed. 60 tablet 3  . triamcinolone ointment (KENALOG) 0.5 % Apply 1 application topically 2 (two) times daily. 45 g 3  . valACYclovir (VALTREX) 1000 MG tablet Take 2t po q12h times 2 doses. Repeat with next fever blister episode. 20 tablet 3  . Na Sulfate-K Sulfate-Mg Sulf 17.5-3.13-1.6 GM/180ML SOLN Take 1 kit by mouth as directed. 354 mL 0   No current facility-administered medications for this visit.     Allergies as of 08/22/2017 - Review Complete 08/22/2017  Allergen Reaction Noted  . Erythromycin ethylsuccinate Anaphylaxis   . Doxycycline Nausea And Vomiting 04/05/2011    Family History  Problem Relation Age of Onset  . Hypertension Mother   . Diabetes Mother   . Hypertension Father   . Diabetes Father   . Colon cancer Neg Hx     Social History   Social History  . Marital status: Single    Spouse name: N/A  . Number of children: 5  . Years of education: N/A   Occupational History  . Insurance underwriter Retired   Social History Main Topics  . Smoking status: Never Smoker  . Smokeless tobacco: Never Used  . Alcohol use No  . Drug use: No  . Sexual activity: Yes   Other Topics Concern  . Not on file   Social History Narrative   He was in the army; jumped off the plane many times   Regular exercise-yes bowling   Daily Caffeine Use-Tea    Review of Systems:    Constitutional: No weight loss, fever or chills Skin: No rash Cardiovascular: No chest pain  Respiratory: No SOB Gastrointestinal: See HPI and otherwise negative Genitourinary: No dysuria  Neurological: No headache Musculoskeletal: No new muscle or joint pain Hematologic: No bleeding Psychiatric: No history of depression or anxiety    Physical Exam:  Vital signs: BP 110/60   Pulse 78   Ht '5\' 10"'  (1.778 m)   Wt 162 lb (73.5 kg)   BMI 23.24 kg/m   Constitutional:   Pleasant AA male appears to be in NAD, Well developed, Well nourished, alert and cooperative Head:  Normocephalic and atraumatic. Eyes:   PEERL, EOMI. No icterus. Conjunctiva pink. Ears:  Normal auditory acuity. Neck:  Supple Throat: Oral cavity and pharynx without inflammation, swelling or lesion.  Respiratory: Respirations even and unlabored. Lungs clear to auscultation bilaterally.   No wheezes, crackles, or rhonchi.  Cardiovascular: Normal S1, S2. No MRG. Regular rate and rhythm. No peripheral edema, cyanosis or pallor.  Gastrointestinal:  Soft, nondistended, mild RLQ ttp  No rebound or guarding. Normal bowel sounds. No appreciable masses or hepatomegaly. Rectal:  Not performed.  Msk:  Symmetrical without gross deformities. Without edema, no deformity or joint abnormality.  Neurologic:  Alert and  oriented x4;  grossly normal neurologically.  Skin:   Dry and intact without significant lesions or rashes. Psychiatric: Demonstrates good judgement and reason without abnormal affect or behaviors.  MOST RECENT LABS AND IMAGING: CBC    Component Value Date/Time   WBC 3.1 (L) 08/10/2017 1404   RBC 4.75 08/10/2017 1404   HGB 14.2 08/10/2017 1404   HCT 43.6 08/10/2017 1404   PLT 238.0 08/10/2017 1404   MCV 91.9 08/10/2017 1404   MCHC 32.4 08/10/2017 1404   RDW 13.5 08/10/2017 1404   LYMPHSABS 0.9 08/10/2017 1404   MONOABS 0.4 08/10/2017 1404   EOSABS 0.1 08/10/2017 1404   BASOSABS 0.0 08/10/2017 1404    CMP     Component Value Date/Time   NA 142 08/10/2017 1404   K 4.7 08/10/2017 1404   CL 109 08/10/2017 1404   CO2 26 08/10/2017 1404   GLUCOSE 89 08/10/2017 1404   BUN 24 (H) 08/10/2017 1404   CREATININE 1.42 08/10/2017 1404   CALCIUM 9.5 08/10/2017 1404   PROT 6.8 08/10/2017 1404   ALBUMIN 4.1 08/10/2017 1404   AST 12 08/10/2017 1404   ALT  11 08/10/2017 1404   ALKPHOS 78 08/10/2017 1404   BILITOT 0.5 08/10/2017 1404   GFRNONAA 82.81 11/03/2010 1137   GFRAA 91 07/26/2008 1006   EXAM: CT ABDOMEN AND PELVIS WITH CONTRAST 08/10/17  TECHNIQUE: Multidetector CT imaging of the abdomen and pelvis was performed using the standard protocol following bolus administration of intravenous contrast.  CONTRAST:  130m ISOVUE-300 IOPAMIDOL (ISOVUE-300) INJECTION 61% IV. Dilute oral contrast.  COMPARISON:  None  FINDINGS: Lower chest: Lung bases clear  Hepatobiliary: Tiny hypervascular nodule posterior RIGHT lobe liver 7 mm diameter question atypical hemangioma. Additional tiny hepatic cysts. Gallbladder and liver otherwise normal appearance.  Pancreas: Normal appearance  Spleen: Normal appearance  Adrenals/Urinary Tract: Adrenal glands normal appearance. Nonobstructing 5 mm LEFT renal calculus image 28. Small LEFT renal cysts. No hydronephrosis or hydroureter. Bladder contains only a small amount of urine, incompletely distended, limiting assessment of wall thickness.  Stomach/Bowel: Normal appendix. Scattered descending colonic diverticulosis. Retained stool in colon limits assessment for masses. Questionable small cecal nodule versus stool artifact 11 mm diameter, having a different appearance from stool throughout remainder of colon. Stomach and bowel loops otherwise normal appearance.  Vascular/Lymphatic: Aorta normal caliber without aneurysm. No definite adenopathy.  Reproductive: Prostatic enlargement 5.5 x 4.3 x 4.1 cm. Seminal vesicles unremarkable.  Other: No free air or free fluid. No inflammatory process or definite hernia.  Musculoskeletal: Unremarkable  IMPRESSION: Normal appendix.  Small cysts and nonobstructing calculus in LEFT kidney.  Scattered colonic diverticulosis without definite evidence of diverticulitis.  Cannot exclude 11 mm diameter nodule/polyp at cecum ; followup  non emergent colonoscopy recommended to exclude colonic polyp.  Prostatic enlargement.   Electronically Signed   By: MLavonia DanaM.D.   On: 08/10/2017 16:33  Assessment: 1. Abnormal CT of the abdomen and pelvis: As above, cannot exclude 11 mm diameter nodule/polyp at the cecum, patient also with right lower quadrant pain; question possible obstructive symptoms related to this large possible polyp vs mass 2. Right lower quadrant pain: With above  Plan: 1. Patient was [facility] discuss risks, benefits, limitations and alternatives  the patient agrees to proceed. 2. Prescribed Hyoscyamine sulfate 0.125 mg every 4-6 hours as needed for abdominal cramping #30 with 1 refill 3. Patient may continue his Omeprazole 40 mg when necessary, as it seems to be working for him 4. Reviewed antireflux diet and lifestyle modifications. 5. Patient to follow in clinic per recommendations from Dr. Henrene Pastor after time of procedure.  Jorge Newer, PA-C [facility] 08/22/2017, 1:56 PM  Cc: Plotnikov, [name], MD

## 2017-08-22 NOTE — Progress Notes (Signed)
Assessment and plans for colonoscopy reviewed

## 2017-08-22 NOTE — Patient Instructions (Addendum)
Continue Omeprazole 40 mg as needed.   We have sent the following medications to your pharmacy for you to pick up at your convenience: Hyoscyamine 0.125 mg every 4-6 hours as needed for pain or cramping.    You have been scheduled for a colonoscopy. Please follow written instructions given to you at your visit today.  Please pick up your prep supplies at the pharmacy within the next 1-3 days. If you use inhalers (even only as needed), please bring them with you on the day of your procedure. Your physician has requested that you go to www.startemmi.com and enter the access code given to you at your visit today. This web site gives a general overview about your procedure. However, you should still follow specific instructions given to you by our office regarding your preparation for the procedure.

## 2017-08-30 ENCOUNTER — Encounter: Payer: Self-pay | Admitting: Internal Medicine

## 2017-09-12 ENCOUNTER — Ambulatory Visit (AMBULATORY_SURGERY_CENTER): Payer: Federal, State, Local not specified - PPO | Admitting: Internal Medicine

## 2017-09-12 ENCOUNTER — Encounter: Payer: Self-pay | Admitting: Internal Medicine

## 2017-09-12 VITALS — BP 97/58 | HR 76 | Temp 98.6°F | Resp 16 | Ht 70.0 in | Wt 162.0 lb

## 2017-09-12 DIAGNOSIS — D122 Benign neoplasm of ascending colon: Secondary | ICD-10-CM

## 2017-09-12 DIAGNOSIS — R1031 Right lower quadrant pain: Secondary | ICD-10-CM | POA: Diagnosis not present

## 2017-09-12 DIAGNOSIS — R9389 Abnormal findings on diagnostic imaging of other specified body structures: Secondary | ICD-10-CM | POA: Diagnosis not present

## 2017-09-12 DIAGNOSIS — R933 Abnormal findings on diagnostic imaging of other parts of digestive tract: Secondary | ICD-10-CM | POA: Diagnosis not present

## 2017-09-12 DIAGNOSIS — D120 Benign neoplasm of cecum: Secondary | ICD-10-CM | POA: Diagnosis not present

## 2017-09-12 MED ORDER — SODIUM CHLORIDE 0.9 % IV SOLN: 500.0000 mL | INTRAVENOUS | Status: DC

## 2017-09-12 NOTE — Progress Notes (Signed)
No changes in medical hx since office visit per pt. Pt became light headed after first attempt of IV.  HOB down, covers removed and cold washcloth applied to forehead,

## 2017-09-12 NOTE — Progress Notes (Signed)
Called to room to assist during endoscopic procedure.  Patient ID and intended procedure confirmed with present staff. Received instructions for my participation in the procedure from the performing physician.  

## 2017-09-12 NOTE — Patient Instructions (Signed)
YOU HAD AN ENDOSCOPIC PROCEDURE TODAY AT THE [facility]:   Refer to the procedure report that was given to you for any specific questions about what was found during the examination.  If the procedure report does not answer your questions, please call your gastroenterologist to clarify.  If you requested that your care partner not be given the details of your procedure findings, then the procedure report has been included in a sealed envelope for you to review at your convenience later.  YOU SHOULD EXPECT: Some feelings of bloating in the abdomen. Passage of more gas than usual.  Walking can help get rid of the air that was put into your GI tract during the procedure and reduce the bloating. If you had a lower endoscopy (such as a colonoscopy or flexible sigmoidoscopy) you may notice spotting of blood in your stool or on the toilet paper. If you underwent a bowel prep for your procedure, you may not have a normal bowel movement for a few days.  Please Note:  You might notice some irritation and congestion in your nose or some drainage.  This is from the oxygen used during your procedure.  There is no need for concern and it should clear up in a day or so.  SYMPTOMS TO REPORT IMMEDIATELY:   Following lower endoscopy (colonoscopy or flexible sigmoidoscopy):  Excessive amounts of blood in the stool  Significant tenderness or worsening of abdominal pains  Swelling of the abdomen that is new, acute  Fever of 100F or higher   For urgent or emergent issues, a gastroenterologist can be reached at any hour by calling [phone].   DIET:  We do recommend a small meal at first, but then you may proceed to your regular diet.  Drink plenty of fluids but you should avoid alcoholic beverages for 24 hours. Try to increase the fiber in your diet, and drink plenty of water.  ACTIVITY:  You should plan to take it easy for the rest of today and you should NOT DRIVE or use heavy machinery until  tomorrow (because of the sedation medicines used during the test).    FOLLOW UP: Our staff will call the number listed on your records the next business day following your procedure to check on you and address any questions or concerns that you may have regarding the information given to you following your procedure. If we do not reach you, we will leave a message.  However, if you are feeling well and you are not experiencing any problems, there is no need to return our call.  We will assume that you have returned to your regular daily activities without incident.  If any biopsies were taken you will be contacted by phone or by letter within the next 1-3 weeks.  Please call us at [phone] if you have not heard about the biopsies in 3 weeks.    SIGNATURES/CONFIDENTIALITY: You and/or your care partner have signed paperwork which will be entered into your electronic medical record.  These signatures attest to the fact that that the information above on your After Visit Summary has been reviewed and is understood.  Full responsibility of the confidentiality of this discharge information lies with you and/or your care-partner.  Read all handouts given to you by your recovery room nurse. 

## 2017-09-12 NOTE — Progress Notes (Signed)
Report to PACU, RN, vss, BBS= Clear.  

## 2017-09-12 NOTE — Op Note (Signed)
Jorge Turner Patient Name: Jorge Turner Procedure Date: 09/12/2017 2:20 PM MRN: [id number] Endoscopist: Docia Chuck. Henrene Pastor , MD Age: 61 Referring MD:  Date of Birth: 04-09-1956 Gender: Male Account #: [id number] Procedure:                Colonoscopywith cold snare polypectomy x 1 Indications:              Abdominal pain, Abnormal CT of the GI tract Medicines:                Monitored Anesthesia Care Procedure:                Pre-Anesthesia Assessment:                           - Prior to the procedure, a History and Physical                            was performed, and patient medications and                            allergies were reviewed. The patient's tolerance of                            previous anesthesia was also reviewed. The risks                            and benefits of the procedure and the sedation                            options and risks were discussed with the patient.                            All questions were answered, and informed consent                            was obtained. Prior Anticoagulants: The patient has                            taken no previous anticoagulant or antiplatelet                            agents. ASA Grade Assessment: II - A patient with                            mild systemic disease. After reviewing the risks                            and benefits, the patient was deemed in                            satisfactory condition to undergo the procedure.                           After obtaining informed consent, the colonoscope  was passed under direct vision. Throughout the                            procedure, the patient's blood pressure, pulse, and                            oxygen saturations were monitored continuously. The                            Colonoscope was introduced through the anus and                            advanced to the the cecum, identified by   appendiceal orifice and ileocecal valve. The                            terminal ileum, ileocecal valve, appendiceal                            orifice, and rectum were photographed. The quality                            of the bowel preparation was excellent. The                            colonoscopy was performed without difficulty. The                            patient tolerated the procedure well. The bowel                            preparation used was SUPREP. Scope In: 2:40:33 PM Scope Out: 2:54:29 PM Scope Withdrawal Time: 0 hours 12 minutes 20 seconds  Total Procedure Duration: 0 hours 13 minutes 56 seconds  Findings:                 A 1 mm polyp was found in the mid ascending colon.                            The polyp was removed with a cold snare. Resection                            and retrieval were complete.                           Multiple small and large-mouthed diverticula were                            found in the entire colon.                           Internal hemorrhoids were found during retroflexion.                           The terminal ileum was normal.The exam was  otherwise without abnormality on direct and                            retroflexion views. Complications:            No immediate complications. Estimated blood loss:                            None. Estimated Blood Loss:     Estimated blood loss: none. Impression:               - One 1 mm polyp in the mid ascending colon,                            removed with a cold snare. Resected and retrieved.                           - Marked Diverticulosis in the entire examined                            colon, including the cecum.                           - Internal hemorrhoids.                           - Normal terminal ileum                           - The examination was otherwise normal on direct                            and retroflexion views. No cecal lesion  identified. Recommendation:           - Repeat colonoscopy in 5-10 years for surveillance.                           - Patient has a contact number available for                            emergencies. The signs and symptoms of potential                            delayed complications were discussed with the                            patient. Return to normal activities tomorrow.                            Written discharge instructions were provided to the                            patient.                           - High-fiber diet.                           -  Continue present medications. Try previously                            prescribed antispasmodics as needed for abdominal                            discomfort.                           - Await pathology results. Docia Chuck. Henrene Pastor, MD 09/12/2017 3:04:19 PM This report has been signed electronically.

## 2017-09-13 ENCOUNTER — Telehealth: Payer: Self-pay | Admitting: *Deleted

## 2017-09-13 ENCOUNTER — Ambulatory Visit (INDEPENDENT_AMBULATORY_CARE_PROVIDER_SITE_OTHER): Payer: Federal, State, Local not specified - PPO | Admitting: Family Medicine

## 2017-09-13 ENCOUNTER — Other Ambulatory Visit (INDEPENDENT_AMBULATORY_CARE_PROVIDER_SITE_OTHER): Payer: Federal, State, Local not specified - PPO

## 2017-09-13 ENCOUNTER — Encounter: Payer: Self-pay | Admitting: Family Medicine

## 2017-09-13 VITALS — BP 118/72 | HR 55 | Temp 97.8°F | Ht 70.0 in | Wt 162.0 lb

## 2017-09-13 DIAGNOSIS — R300 Dysuria: Secondary | ICD-10-CM

## 2017-09-13 DIAGNOSIS — N342 Other urethritis: Secondary | ICD-10-CM

## 2017-09-13 LAB — URINALYSIS
Bilirubin Urine: NEGATIVE
KETONES UR: NEGATIVE
Leukocytes, UA: NEGATIVE
Nitrite: NEGATIVE
Specific Gravity, Urine: 1.02 (ref 1.000–1.030)
TOTAL PROTEIN, URINE-UPE24: NEGATIVE
UROBILINOGEN UA: 0.2 (ref 0.0–1.0)
Urine Glucose: NEGATIVE
pH: 5.5 (ref 5.0–8.0)

## 2017-09-13 NOTE — Progress Notes (Signed)
Jorge Turner - 61 y.o. male MRN [id number]  Date of birth: 06-26-1956  SUBJECTIVE:  Including CC & ROS.  Chief Complaint  Patient presents with  . pain with urination    started 2-3 weeks ago    Jorge Turner is a 61 year old male that is presenting with dysuria. This is been intermittent for the past 2-3 weeks. He denies any fevers or chills. Denies any flank pain. He reports a history of having a bladder infection. Reports to being in amount relationship and has not had any new sexual partners. Is not having any rashes. Denies any penile discharge. Denies any blood in his urine. Has not been started on any new medications   He had a colonoscopy done and was told that he had an enlarged prostate. He has multiple diverticula throughout the entire colon. He had a polyp removed. Review his CT abdomen pelvis from 08/10/17 shows a small cyst in the left kidney prostatic enlargement, and polyp in the colon.  Has a history of balanitis and a history of urethritis. Review of chart from February 2013 had a history of prostatitis and provided Cipro.  Review of Systems  Constitutional: Negative for fever.  Genitourinary: Positive for dysuria.    HISTORY: Past Medical, Surgical, Social, and Family History Reviewed & Updated per EMR.   Pertinent Historical Findings include:  Past Medical History:  Diagnosis Date  . Balanitis   . BPH (benign prostatic hypertrophy)   . DJD (degenerative joint disease) of knee   . Eczema   . GERD (gastroesophageal reflux disease)   . Hemorrhoid   . Low back pain   . Personal history of colonic polyps 03/03/2010  . Vitamin B12 deficiency     Past Surgical History:  Procedure Laterality Date  . CIRCUMCISION N/A 07/20/2013   Procedure: CIRCUMCISION ADULT;  Surgeon: Claybon Jabs, MD;  Location: [facility];  Service: Urology;  Laterality: N/A;  . COLONOSCOPY    . CYSTO REMOVAL RIGHT THUMB  2006  . RIGHT HYDROCELECTOMY  11-13-2010    Allergies    Allergen Reactions  . Erythromycin Ethylsuccinate Anaphylaxis  . Doxycycline Nausea And Vomiting    Family History  Problem Relation Age of Onset  . Hypertension Mother   . Diabetes Mother   . Hypertension Father   . Diabetes Father   . Colon cancer Neg Hx   . Rectal cancer Neg Hx   . Stomach cancer Neg Hx      Social History   Social History  . Marital status: Single    Spouse name: N/A  . Number of children: 5  . Years of education: N/A   Occupational History  . Insurance underwriter Retired   Social History Main Topics  . Smoking status: Never Smoker  . Smokeless tobacco: Never Used  . Alcohol use No  . Drug use: No  . Sexual activity: Yes   Other Topics Concern  . Not on file   Social History Narrative   He was in the army; jumped off the plane many times   Regular exercise-yes bowling   Daily Caffeine Use-Tea     PHYSICAL EXAM:  VS: BP 118/72 (BP Location: Left Arm, Patient Position: Sitting, Cuff Size: Large)   Pulse (!) 55   Temp 97.8 F (36.6 C) (Oral)   Ht 5\' 10"  (1.778 m)   Wt 162 lb (73.5 kg)   SpO2 100%   BMI 23.24 kg/m  Physical Exam Gen: NAD, alert, cooperative with  exam, well-appearing ENT: normal lips, normal nasal mucosa,  Eye: normal EOM, normal conjunctiva and lids CV:  no edema, +2 pedal pulses, S1-S2   Resp: no accessory muscle use, non-labored, clear to auscultation bilaterally GI: no masses or tenderness, no hernia soft, normal bowel sounds, no distention Skin: no rashes, no areas of induration  Neuro: normal tone, normal sensation to touch Psych:  normal insight, alert and oriented MSK: Normal gait, normal strength, no CVA tenderness      ASSESSMENT & PLAN:   Dysuria Has no systemic features and CT did not show a stone. Colonoscopy performed yesterday showed enlarged prostate. Possible for a urethral right is versus prostatitis - Urinalysis, urine culture, diarrhea and chlamydia - Could initiate Cipro pending on  results.

## 2017-09-13 NOTE — Telephone Encounter (Signed)
  Follow up Call-  Call back number 09/12/2017  Post procedure Call Back phone  # [phone]  Permission to leave phone message Yes  Some recent data might be hidden     Patient questions:  Do you have a fever, pain , or abdominal swelling? No. Pain Score  0 *  Have you tolerated food without any problems? Yes.    Have you been able to return to your normal activities? Yes.    Do you have any questions about your discharge instructions: Diet   No. Medications  No. Follow up visit  No.  Do you have questions or concerns about your Care? No.  Actions: * If pain score is 4 or above: No action needed, pain <4.

## 2017-09-13 NOTE — Patient Instructions (Addendum)
Thank you for coming in,   We will call you with the results from today.   I may need to send in an antibiotic. Please take a probiotic with this.      Please feel free to call with any questions or concerns at any time, at [phone]. --Dr. Raeford Razor

## 2017-09-13 NOTE — Assessment & Plan Note (Signed)
Has no systemic features and CT did not show a stone. Colonoscopy performed yesterday showed enlarged prostate. Possible for a urethral right is versus prostatitis - Urinalysis, urine culture, diarrhea and chlamydia - Could initiate Cipro pending on results.

## 2017-09-14 LAB — URINE CULTURE
MICRO NUMBER:: 81152799
Result:: NO GROWTH
SPECIMEN QUALITY:: ADEQUATE

## 2017-09-15 ENCOUNTER — Telehealth: Payer: Self-pay | Admitting: Family Medicine

## 2017-09-15 ENCOUNTER — Telehealth: Payer: Self-pay | Admitting: *Deleted

## 2017-09-15 ENCOUNTER — Encounter: Payer: Self-pay | Admitting: Internal Medicine

## 2017-09-15 LAB — GC/CHLAMYDIA PROBE AMP
Chlamydia trachomatis, NAA: NEGATIVE
Neisseria gonorrhoeae by PCR: NEGATIVE

## 2017-09-15 MED ORDER — CEFTRIAXONE SODIUM 500 MG IJ SOLR
500.0000 mg | Freq: Once | INTRAMUSCULAR | Status: AC
  Administered 2017-09-16: 500 mg via INTRAMUSCULAR

## 2017-09-15 MED ORDER — DOXYCYCLINE HYCLATE 100 MG PO TABS: 100.0000 mg | ORAL_TABLET | Freq: Two times a day (BID) | ORAL | 0 refills | Status: DC

## 2017-09-15 NOTE — Telephone Encounter (Addendum)
-----   Message from Rosemarie Ax, MD sent at 09/15/2017  1:36 PM EDT ----- Please advise that he should come into a nurse clinic to receive a intramuscular injection of CTX 500 mg and I will send in doxy   Jorge Turner  ----- Message ----- From: Shanon Ace, CMA Sent: 09/15/2017  11:28 AM To: Rosemarie Ax, MD  Patient notified as instructed, verbalized understanding. Patient states he is still having symptoms and may need an antibiotic as stated.

## 2017-09-15 NOTE — Telephone Encounter (Signed)
Has anaphylaxis to azithromycin. Will treat suspected urethritis with IM CTX and doxy.   Rosemarie Ax, MD [facility] 09/15/2017, 1:43 PM

## 2017-09-15 NOTE — Addendum Note (Signed)
Addended by: Verlene Mayer A on: 09/15/2017 03:29 PM   Modules accepted: Orders

## 2017-09-15 NOTE — Telephone Encounter (Signed)
Patient aware to pick up prescription and nurse visit appointment made for 09/16/17 for rocephin injection.

## 2017-09-16 ENCOUNTER — Ambulatory Visit (INDEPENDENT_AMBULATORY_CARE_PROVIDER_SITE_OTHER): Payer: Federal, State, Local not specified - PPO

## 2017-09-16 DIAGNOSIS — N342 Other urethritis: Secondary | ICD-10-CM

## 2017-09-16 MED ORDER — CEFTRIAXONE SODIUM 500 MG IJ SOLR: 500.0000 mg | Freq: Once | INTRAMUSCULAR | 0 refills | Status: AC

## 2017-10-11 ENCOUNTER — Ambulatory Visit: Payer: Federal, State, Local not specified - PPO | Admitting: Family Medicine

## 2017-10-11 ENCOUNTER — Encounter: Payer: Self-pay | Admitting: Family Medicine

## 2017-10-11 ENCOUNTER — Ambulatory Visit: Payer: Self-pay

## 2017-10-11 VITALS — BP 110/82 | HR 66 | Ht 70.0 in | Wt 164.0 lb

## 2017-10-11 DIAGNOSIS — M222X2 Patellofemoral disorders, left knee: Secondary | ICD-10-CM | POA: Diagnosis not present

## 2017-10-11 DIAGNOSIS — M25531 Pain in right wrist: Secondary | ICD-10-CM

## 2017-10-11 DIAGNOSIS — G5601 Carpal tunnel syndrome, right upper limb: Secondary | ICD-10-CM | POA: Diagnosis not present

## 2017-10-11 DIAGNOSIS — M222X1 Patellofemoral disorders, right knee: Secondary | ICD-10-CM | POA: Diagnosis not present

## 2017-10-11 DIAGNOSIS — M222X9 Patellofemoral disorders, unspecified knee: Secondary | ICD-10-CM | POA: Insufficient documentation

## 2017-10-11 MED ORDER — DICLOFENAC SODIUM 2 % TD SOLN: TRANSDERMAL | 3 refills | Status: DC

## 2017-10-11 NOTE — Assessment & Plan Note (Signed)
Patient does have carpal tunnel syndrome.  Referred to formal physical therapy.  We discussed gabapentin again.  We discussed icing regimen, home exercises.  Patient would also likely improve with a steroid injection if needed.  Patient declined that at this time but will reconsider if worsening pain.  Follow-up with me 3-4 weeks

## 2017-10-11 NOTE — Progress Notes (Signed)
[facility] [address] [place], [place] [address] Phone: [phone] Subjective:      CC: Right wrist and bilateral knee pain  [id number]  Jorge Turner is a 61 y.o. male coming in for right wrist and bilateral knee pain.   His wrist has been bothering him for years. He broke the right wrist when in the [employer]. He has constant pain. He is unable to pick up weight with that hand without feeling as if he is going to drop it and he also has tingling into his fingers.  Patient has been diagnosed with carpal tunnel but had been doing very well with conservative therapy for quite some time.  His knee pain is chronic. His pain is above the patella. He has a prior history of surgery on left knee for torn MCL. His pain is constant. He notices more pain with stair climbing. He use to bowl but has cut back due to the pain in both the knees and wrist.  She states that the pain seems to be on the anterior aspect of the knee.  Describes it as a dull, throbbing aching pain.  1 some direction on what to do so we can increase activity.     Past Medical History:  Diagnosis Date  . Balanitis   . BPH (benign prostatic hypertrophy)   . DJD (degenerative joint disease) of knee   . Eczema   . GERD (gastroesophageal reflux disease)   . Hemorrhoid   . Low back pain   . Personal history of colonic polyps 03/03/2010  . Vitamin B12 deficiency    Past Surgical History:  Procedure Laterality Date  . COLONOSCOPY    . CYSTO REMOVAL RIGHT THUMB  2006  . RIGHT HYDROCELECTOMY  11-13-2010   Social History   Socioeconomic History  . Marital status: Single    Spouse name: Not on file  . Number of children: 5  . Years of education: Not on file  . Highest education level: Not on file  Social Needs  . Financial resource strain: Not on file  . Food insecurity - worry: Not on file  . Food insecurity - inability: Not on file  . Transportation needs - medical: Not on file  .  Transportation needs - non-medical: Not on file  Occupational History  . Occupation: Programmer, applications: RETIRED  Tobacco Use  . Smoking status: Never Smoker  . Smokeless tobacco: Never Used  Substance and Sexual Activity  . Alcohol use: No  . Drug use: No  . Sexual activity: Yes  Other Topics Concern  . Not on file  Social History Narrative   He was in the army; jumped off the plane many times   Regular exercise-yes bowling   Daily Caffeine Use-Tea   Allergies  Allergen Reactions  . Erythromycin Ethylsuccinate Anaphylaxis  . Doxycycline Nausea And Vomiting   Family History  Problem Relation Age of Onset  . Hypertension Mother   . Diabetes Mother   . Hypertension Father   . Diabetes Father   . Colon cancer Neg Hx   . Rectal cancer Neg Hx   . Stomach cancer Neg Hx      Past medical history, social, surgical and family history all reviewed in electronic medical record.  No pertanent information unless stated regarding to the chief complaint.   Review of Systems:Review of systems updated and as accurate as of 10/11/17  No headache, visual changes, nausea, vomiting,  diarrhea, constipation, dizziness, abdominal pain, skin rash, fevers, chills, night sweats, weight loss, swollen lymph nodes, chest pain, shortness of breath, mood changes.  The body aches, muscle aches  Objective  There were no vitals taken for this visit. Systems examined below as of 10/11/17   General: No apparent distress alert and oriented x3 mood and affect normal, dressed appropriately.  HEENT: Pupils equal, extraocular movements intact  Respiratory: Patient's speak in full sentences and does not appear short of breath  Cardiovascular: No lower extremity edema, non tender, no erythema  Skin: Warm dry intact with no signs of infection or rash on extremities or on axial skeleton.  Abdomen: Soft nontender  Neuro: Cranial nerves II through XII are intact, neurovascularly intact in all  extremities with 2+ DTRs and 2+ pulses.  Lymph: No lymphadenopathy of posterior or anterior cervical chain or axillae bilaterally.  Gait normal with good balance and coordination.  MSK: Mild diffuse tender with full range of motion and good stability and symmetric strength and tone of shoulders, elbows, , hip, and ankles bilaterally.   Knee: Right lateral Diffuse tenderness Tender over the patellofemoral joint bilaterally ROM full in flexion and extension and lower leg rotation. Ligaments with solid consistent endpoints including ACL, PCL, LCL, MCL. Negative Mcmurray's, Apley's, and Thessalonian tests. painful patellar compression. Patellar glide with mild crepitus. Patellar and quadriceps tendons unremarkable. Hamstring and quadriceps strength is normal.  Wrist: Right Inspection normal with no visible erythema or swelling. ROM smooth and normal with good flexion and extension and ulnar/radial deviation that is symmetrical with opposite wrist. Palpation is normal over metacarpals, navicular, lunate, and TFCC; tendons without tenderness/ swelling No snuffbox tenderness. No tenderness over Canal of Guyon. Strength 5/5 in all directions without pain. Negative Finkelstein, tinel's and phalens. Negative Watson's test.    Impression and Recommendations:     This case required medical decision making of moderate complexity.      Note: This dictation was prepared with Dragon dictation along with smaller phrase technology. Any transcriptional errors that result from this process are unintentional.

## 2017-10-11 NOTE — Assessment & Plan Note (Signed)
Bilateral. Patellofemoral Syndrome  Reviewed anatomy using anatomical model and how PFS occurs.  Given rehab exercises handout for VMO, hip abductors, core, entire kinetic chain including proprioception exercises including cone touches, step downs, hip elevations and turn outs.  Could benefit from PT, regular exercise, upright biking, and a PFS knee brace to assist with tracking abnormalities. Patient will start PT.  Will follow up with me again in 3-4 weeks.

## 2017-10-11 NOTE — Patient Instructions (Signed)
Good to see you  We will inject you on Friday  pennsaid pinkie amount topically 2 times daily as needed.   Gabapentin 2 pills at night if you can tolerate it.  See you soon

## 2017-10-14 ENCOUNTER — Ambulatory Visit: Payer: Federal, State, Local not specified - PPO | Admitting: Family Medicine

## 2017-10-22 ENCOUNTER — Other Ambulatory Visit: Payer: Self-pay | Admitting: Internal Medicine

## 2017-10-25 ENCOUNTER — Ambulatory Visit (INDEPENDENT_AMBULATORY_CARE_PROVIDER_SITE_OTHER): Payer: Federal, State, Local not specified - PPO | Admitting: Rehabilitative and Restorative Service Providers"

## 2017-10-25 ENCOUNTER — Encounter: Payer: Self-pay | Admitting: Rehabilitative and Restorative Service Providers"

## 2017-10-25 DIAGNOSIS — R293 Abnormal posture: Secondary | ICD-10-CM | POA: Diagnosis not present

## 2017-10-25 DIAGNOSIS — M25562 Pain in left knee: Secondary | ICD-10-CM

## 2017-10-25 DIAGNOSIS — R29898 Other symptoms and signs involving the musculoskeletal system: Secondary | ICD-10-CM

## 2017-10-25 DIAGNOSIS — M25561 Pain in right knee: Secondary | ICD-10-CM

## 2017-10-25 DIAGNOSIS — M25531 Pain in right wrist: Secondary | ICD-10-CM | POA: Diagnosis not present

## 2017-10-25 DIAGNOSIS — G8929 Other chronic pain: Secondary | ICD-10-CM

## 2017-10-25 NOTE — Therapy (Signed)
[facility] [address] [place], [place], [address] Phone: [phone]   Fax:  [phone]  Physical Therapy Treatment  Patient Details  Name: Jorge Turner MRN: [id number] Date of Birth: May 03, 1956 Referring Provider: Dr Antoine Primas   Encounter Date: 10/25/2017  PT End of Session - 10/25/17 0840    Visit Number  1    Number of Visits  12    Date for PT Re-Evaluation  12/06/17    PT Start Time  0841    PT Stop Time  0937    PT Time Calculation (min)  56 min    Activity Tolerance  Patient tolerated treatment well       Past Medical History:  Diagnosis Date  . Balanitis   . BPH (benign prostatic hypertrophy)   . DJD (degenerative joint disease) of knee   . Eczema   . GERD (gastroesophageal reflux disease)   . Hemorrhoid   . Low back pain   . Personal history of colonic polyps 03/03/2010  . Vitamin B12 deficiency     Past Surgical History:  Procedure Laterality Date  . CIRCUMCISION N/A 07/20/2013   Procedure: CIRCUMCISION ADULT;  Surgeon: Garnett Farm, MD;  Location: [facility];  Service: Urology;  Laterality: N/A;  . COLONOSCOPY    . CYSTO REMOVAL RIGHT THUMB  2006  . RIGHT HYDROCELECTOMY  11-13-2010    There were no vitals filed for this visit.  Subjective Assessment - 10/25/17 0850    Subjective  Darle reports that he has had Rt wrist pain for the past several years with symptoms worsening in the past 18 months. In the past 6 months he has difficulty lifting items during the daand usinng Rt hand for functional activities. He has pain on a daily basis. He also has pain in both knees which has been present for several years. knee pain has increased in the past 6 months woth no accident or injury. 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     Pertinent History  Rt wrist fx 1977; Lt knee surgery 1/16 for torn MCL     Diagnostic tests  xrays      Patient Stated Goals  patient wants ot get wrist and knees stronger and less painful so he can use wrist and knees for normal function - avoid surgery     Currently in Pain?  Yes    Pain Score  6     Pain Location  Wrist    Pain Orientation  Right    Pain Descriptors / Indicators  Nagging    Pain Type  Chronic pain    Pain Radiating Towards  into the long and ring fingers and thumb mostly     Pain Onset  More than a month ago    Pain Frequency  Constant    Aggravating Factors   using arm to lift    Pain Relieving Factors  brace; Rx meds     Multiple Pain Sites  Yes    Pain Score  5    Pain Location  Knee    Pain Orientation  Right;Left    Pain Descriptors / Indicators  Nagging    Pain Onset  More than a month ago    Pain Frequency  Intermittent    Aggravating Factors   walking; stairs     Pain Relieving Factors  therapy; meds  [facility] PT Assessment - 10/25/17 0001      Assessment   Medical Diagnosis  Rt carpal tunnel syndrome; Bilat patellofemoral syndrome    Referring Provider  Dr Antoine Primas    Onset Date/Surgical Date  03/29/17 flare up of symptoms - present for years     Hand Dominance  Right    Next MD Visit  12/18    Prior Therapy  yes - for Lt knee following surgery       Precautions   Precautions  None      Balance Screen   Has the patient fallen in the past 6 months  No    Has the patient had a decrease in activity level because of a fear of falling?   No    Is the patient reluctant to leave their home because of a fear of falling?   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     Leisure  yard work; household chores; cooking; watching sports; bowling - infrequently       Observation/Other Assessments   Focus on Therapeutic Outcomes (FOTO)   52% limitation - wrist        Sensation   Additional Comments  intermittent tingling Rt long and ring fingers; Rt thumb       Posture/Postural Control   Posture Comments  head forward; shoudlers rounded and elevated;       AROM   Right/Left Shoulder  -- tight end ranges bilat    Right/Left Elbow  -- WFL    Right/Left Forearm  -- supination WFL's; pronation tight Rt 72 deg; Lt 75    Right Wrist Extension  68 Degrees    Right Wrist Flexion  43 Degrees    Left Wrist Extension  74 Degrees    Left Wrist Flexion  64 Degrees    Right/Left Hip  -- WFL's bilat     Right/Left Knee  -- WFL's bilat     Right/Left Ankle  -- neutral DF only bilat       Strength   Right/Left Shoulder  -- grossly 5-/5 Rt shd flex/ER/abd - Lt 5/5    Right Elbow Flexion  4/5    Right Elbow Extension  4/5    Left Elbow Flexion  5/5    Left Elbow Extension  5/5    Right Wrist Flexion  4-/5    Right Wrist Extension  4-/5    Left Wrist Flexion  5/5    Left Wrist Extension  5/5    Right Hand Grip (lbs)  22/15/14    Left Hand Grip (lbs)  72/68/64    Right/Left Hip  -- 5/5 bilat except hip abd 5-/5    Right/Left Knee  -- 5/5 bilat     Right/Left Ankle  -- 5/5 bilat       Flexibility   Hamstrings  Rt 70 deg; Lt 65 deg     Quadriceps  Rt 111 deg; Lt 101    ITB  tight bilat     Piriformis  tight bilat       Palpation   Patella mobility  WFL    Palpation comment  tight bilat distal quads; tender and tight Rt wrist and forearm  [facility] Adult PT Treatment/Exercise - 10/25/17 0001      Knee/Hip Exercises: Stretches   Passive Hamstring Stretch  Right;Left;3 reps;30 seconds supine with strap     Quad Stretch  Right;Left;3 reps;30 seconds prone with strap     ITB Stretch  Right;Left;2 reps;30 seconds supine with strap       Wrist Exercises   Other wrist exercises  wrist flexion and extension stretches 20 sec x 3 each       Moist Heat Therapy   Number Minutes Moist Heat  15 Minutes    Moist Heat Location  Wrist Rt       Cryotherapy   Number Minutes Cryotherapy  15 Minutes    Cryotherapy Location  Knee bilat     Type of Cryotherapy  Ice pack      Electrical Stimulation   Electrical Stimulation Location  Rt dorsal and volar wrist and forearm     Electrical Stimulation Action  IFC    Electrical Stimulation Parameters  to tolerance    Electrical Stimulation Goals  Pain             PT Education - 10/25/17 0931    Education provided  Yes    Education Details  HEP TENS     Person(s) Educated  Patient    Methods  Explanation;Demonstration;Tactile cues;Verbal cues;Handout    Comprehension  Verbalized understanding;Returned demonstration;Verbal cues required;Tactile cues required          PT Long Term Goals - 10/25/17 1240      PT LONG TERM GOAL #1   Title  Improve posture and alignment with patient to engage posterior shoulder girdle musculature 12/06/17    Time  6    Period  Weeks    Status  New      PT LONG TERM GOAL #2   Title  Increase ROM Rt UE shoulder and wrist to WFL's and pain free allowing improved UE function 12/06/17    Time  6    Period  Weeks    Status  New      PT LONG TERM GOAL #3   Title  Decrease pain in Rt wrist by 50-75% allowing patient to increase functional activity level 12/06/17    Time  6    Period  Weeks    Status  New      PT LONG TERM GOAL #4   Title  Increase quad extensibility allowing patient to decrease knee pain with funcitonal activities including stiars     Time  6    Period  Weeks    Status  New      PT LONG TERM GOAL #5   Title  Decrease bilat knee pai nby 50-75% allowing patient to ascend and descend 8-10 steps without difficutly 12/06/17    Time  6    Period  Weeks      Additional Long Term Goals   Additional Long Term Goals  Yes      PT LONG TERM GOAL #6   Title  Independent in HEP 12/06/17    Time  6    Period  Weeks    Status  New      PT LONG TERM GOAL #7   Title  Improve FOTO to </= 40% limitation 12/06/17    Time  6    Period   Weeks    Status  New            Plan - 10/25/17 1236  Clinical Impression Statement  Brent presents with history of chronic Rt wrist and bilat knee pain over many years. He has experienced increased symtpoms in the past 6 months. Patient has limited ROM Rt wrist and UE as well as weakness and decresaed functional abilities. he has muscular tightness through bilat quads wit hpoor patellar tracking and pain with functional activities. There is muscular imbalance in bilaty LE's. Patient will benefit from PT to address problems identified.     Clinical Presentation  Evolving    Clinical Decision Making  Moderate    Rehab Potential  Good    Clinical Impairments Affecting Rehab Potential  chronic nature of problems     PT Frequency  2x / week    PT Duration  6 weeks    PT Treatment/Interventions  Patient/family education;ADLs/Self Care Home Management;Cryotherapy;Iontophoresis 4mg /ml Dexamethasone;Moist Heat;Ultrasound;Dry needling;Manual techniques;Neuromuscular re-education;Therapeutic activities;Therapeutic exercise;Taping    PT Next Visit Plan  review HEP; progress stretching; add UE stretching for shoulder/pecs; add postural exercise; consider taping knees; progress exercise; modalities and manual work as indicated     Financial planner with Plan of Care  Patient       Patient will benefit from skilled therapeutic intervention in order to improve the following deficits and impairments:  Impaired UE functional use, Postural dysfunction, Improper body mechanics, Increased fascial restricitons, Increased muscle spasms, Decreased range of motion, Decreased strength, Decreased mobility, Decreased activity tolerance  Visit Diagnosis: Pain in right wrist - Plan: PT plan of care cert/re-cert  Chronic pain of left knee - Plan: PT plan of care cert/re-cert  Chronic pain of right knee - Plan: PT plan of care cert/re-cert  Other symptoms and signs involving the musculoskeletal system - Plan:  PT plan of care cert/re-cert  Abnormal posture - Plan: PT plan of care cert/re-cert     Problem List Patient Active Problem List   Diagnosis Date Noted  . Patella-femoral syndrome 10/11/2017  . RTI (respiratory tract infection) 07/19/2017  . Right hand weakness 03/07/2017  . Extensor intersection syndrome of right wrist 12/23/2016  . Hamstring tightness of right lower extremity 12/23/2016  . Carpal tunnel syndrome, right 06/07/2016  . Pain of right thumb 05/17/2016  . Paresthesia 01/20/2016  . CMC (carpometacarpal) synovitis 01/05/2016  . Medial meniscus tear 10/15/2014  . Left knee pain 09/24/2014  . Neck muscle strain 03/11/2014  . Elevated PSA 06/11/2013  . Hx of balanitis 06/11/2013  . Knee pain, bilateral 02/09/2013  . Cough 02/09/2013  . Dental infection 06/15/2012  . Dysuria 12/31/2011  . Bladder neck obstruction 10/25/2011  . Rash 07/16/2011  . Urethritis 06/01/2011  . Headache(784.0) 04/05/2011  . RLQ abdominal pain 04/05/2011  . NECK PAIN 10/19/2010  . DYSPLASTIC NEVUS, CHEST 03/23/2010  . Eczema 03/23/2010  . HYDROCELE, RIGHT 03/03/2010  . Nocturia 03/03/2010  . Personal history of colonic polyps 03/03/2010  . RECTAL BLEEDING 01/27/2010  . Unspecified disorder of male genital organs 01/27/2010  . NONSPECIFIC ABN FINDING RAD & OTH EXAM GU ORGAN 01/27/2010  . HEMATOCHEZIA 01/23/2010  . BENIGN PROSTATIC HYPERTROPHY 01/23/2010  . HAND PAIN 01/01/2010  . B12 deficiency 06/03/2009  . WRIST PAIN 06/03/2009  . Chest pain on respiration 04/25/2009  . SHOULDER STRAIN, RIGHT 04/25/2009  . FUNGAL DERMATITIS 04/12/2009  . LOW BACK PAIN 07/29/2008  . GERD 04/30/2008  . WEIGHT LOSS, ABNORMAL 04/30/2008  . HEMORRHOIDS 04/23/2008  . SEBACEOUS CYST, INFECTED 04/23/2008  . TENDINITIS, RIGHT ELBOW 10/21/2007    Keeara Frees Rober Minion PT, MPH  10/25/2017,  12:47 PM  [facility] [address] [place], [place],  [address] Phone: [phone]   Fax:  [phone]  Name: MUKUL KRAHMER MRN: [id number] Date of Birth: [birth year]

## 2017-10-25 NOTE — Patient Instructions (Addendum)
HIP: Hamstrings - Supine  Place strap around foot. Raise leg up, keeping knee straight.  Bend opposite knee to protect back if indicated. Hold 30 seconds. 3 reps per set, 2-3 sets per day  Outer Hip Stretch: Reclined IT Band Stretch (Strap)   Strap around one foot, pull leg across body until you feel a pull or stretch in the outside of your hip, with shoulders on mat. Hold for 30 seconds. Repeat 3 times each leg. 2-3 times/day.   Quads / HF, Prone KNEE: Quadriceps - Prone    Place strap around ankle. Bring ankle toward buttocks. Press hip into surface. Hold 30 seconds. Repeat 3 times per session. Do 2-3 sessions per day.  Wrist Flexor Stretch    Keeping elbow straight, grasp left hand and slowly bend wrist back until stretch is felt. Hold __20__ seconds. Relax. Repeat _3___ times per set. Do ___2-3_ sessions per day.     Wrist Extensor Stretch    Keeping elbow straight, grasp left hand and slowly bend wrist forward until stretch is felt. Hold __20__ seconds. Relax. Repeat ___3_ times per set. Do _2-3___ sessions per day.   TENS UNIT: This is helpful for muscle pain and spasm.   Search and Purchase a TENS 7000 2nd edition at www.tenspros.com. It should be less than $30.     TENS unit instructions: Do not shower or bathe with the unit on Turn the unit off before removing electrodes or batteries If the electrodes lose stickiness add a drop of water to the electrodes after they are disconnected from the unit and place on plastic sheet. If you continued to have difficulty, call the TENS unit company to purchase more electrodes. Do not apply lotion on the skin area prior to use. Make sure the skin is clean and dry as this will help prolong the life of the electrodes. After use, always check skin for unusual red areas, rash or other skin difficulties. If there are any skin problems, does not apply electrodes to the same area. Never remove the electrodes from the unit by  pulling the wires. Do not use the TENS unit or electrodes other than as directed. Do not change electrode placement without consultating your therapist or physician. Keep 2 fingers with between each electrode.

## 2017-10-28 ENCOUNTER — Ambulatory Visit: Payer: Federal, State, Local not specified - PPO | Admitting: Physical Therapy

## 2017-10-28 DIAGNOSIS — R293 Abnormal posture: Secondary | ICD-10-CM

## 2017-10-28 DIAGNOSIS — M25561 Pain in right knee: Secondary | ICD-10-CM

## 2017-10-28 DIAGNOSIS — M25531 Pain in right wrist: Secondary | ICD-10-CM

## 2017-10-28 DIAGNOSIS — M25562 Pain in left knee: Secondary | ICD-10-CM | POA: Diagnosis not present

## 2017-10-28 DIAGNOSIS — G8929 Other chronic pain: Secondary | ICD-10-CM | POA: Diagnosis not present

## 2017-10-28 DIAGNOSIS — R29898 Other symptoms and signs involving the musculoskeletal system: Secondary | ICD-10-CM

## 2017-10-28 NOTE — Patient Instructions (Signed)
Stretching: Quadriceps (Standing)    Pull right heel toward buttock until stretch is felt in front of thigh. Hold _30___ seconds. Repeat __2-3__ times per set. Do __1__ sets per session. Do _2___ sessions per day.  Calf Stretch    Place hands on wall at shoulder height. Keeping back leg straight, bend front leg, feet pointing forward, heels flat on floor. Lean forward slightly until stretch is felt in calf of back leg. Hold stretch __30_ seconds, breathing slowly in and out. Repeat stretch with other leg back. Do _1-2__ sessions per day.  Doorway stretch    Stand in doorframe with palms against frame and arms at 90. Lean forward and squeeze shoulder blades. Hold _30__ seconds. Repeat _2__ times per session. Do _2__ sessions per day.   HIP: Hamstrings - Short Sitting    Rest leg on raised surface. Keep knee straight. Lift chest. Hold _30__ seconds. _2-3__ reps per set, _2_ sets per day  Hip Stretch    Put right ankle over left knee. Let right knee fall downward, but keep ankle in place. Feel the stretch in hip. May push down gently with hand to feel stretch. Can pull knee towards opposite shoulder.  Hold __30__ seconds while counting out loud. Repeat with other leg. Repeat __2__ times. Do __2__ sessions per day.    [facility] [address], [place] [address]  [phone] (office) [phone] (fax)

## 2017-10-28 NOTE — Therapy (Signed)
[place], [place], [address] Phone: [phone]   Fax:  [phone]  Physical Therapy Treatment  Patient Details  Name: Jorge Turner MRN: [id number] Date of Birth: 04-08-1956 Referring Provider: Dr. Hulan Saas   Encounter Date: 10/28/2017  PT End of Session - 10/28/17 1459    Visit Number  2    Number of Visits  12    Date for PT Re-Evaluation  12/06/17    PT Start Time  2992    PT Stop Time  1512    PT Time Calculation (min)  67 min    Activity Tolerance  Patient tolerated treatment well    Behavior During Therapy  [facility] for tasks assessed/performed       Past Medical History:  Diagnosis Date  . Balanitis   . BPH (benign prostatic hypertrophy)   . DJD (degenerative joint disease) of knee   . Eczema   . GERD (gastroesophageal reflux disease)   . Hemorrhoid   . Low back pain   . Personal history of colonic polyps 03/03/2010  . Vitamin B12 deficiency     Past Surgical History:  Procedure Laterality Date  . CIRCUMCISION N/A 07/20/2013   Procedure: CIRCUMCISION ADULT;  Surgeon: Claybon Jabs, MD;  Location: [facility];  Service: Urology;  Laterality: N/A;  . COLONOSCOPY    . CYSTO REMOVAL RIGHT THUMB  2006  . RIGHT HYDROCELECTOMY  11-13-2010    There were no vitals filed for this visit.  Subjective Assessment - 10/28/17 1405    Subjective  Pt reports he was getting up and down from chair a lot a work today.  His legs are a little sore from the new stretches.  He doesn't feel as active as he use to be; would like to return to bowling.     Pertinent History  Rt wrist fx 1977; Lt knee surgery 1/16 for torn MCL     Patient Stated Goals  patient wants ot get wrist and knees stronger and less painful so he can use wrist and knees for normal function - avoid surgery.  Return to bowling     Currently in Pain?  Yes    Pain Score  5     Pain Location  Wrist    Pain Orientation   Right    Pain Descriptors / Indicators  Nagging    Aggravating Factors   using arm to lift.     Pain Relieving Factors  brace, medicine    Pain Score  6    Pain Location  Knee    Pain Orientation  Right;Left    Pain Descriptors / Indicators  Nagging    Aggravating Factors   walking; stairs    Pain Relieving Factors  medication          [facility] PT Assessment - 10/28/17 0001      Assessment   Medical Diagnosis  Rt carpal tunnel syndrome; Bilat patellofemoral syndrome    Referring Provider  Dr. Hulan Saas    Onset Date/Surgical Date  03/29/17 flare up of symptoms - present for years     Hand Dominance  Right    Next MD Visit  12/18       [facility] Adult PT Treatment/Exercise - 10/28/17 0001      Self-Care   Self-Care  Other Self-Care Comments    Other Self-Care Comments   pt educated on self massage with stick roller to bilat quads; pt  returned demo and verbalized understanding.       Knee/Hip Exercises: Stretches   Passive Hamstring Stretch  Right;Left;2 reps;30 seconds    Quad Stretch  Right;Left;2 reps;30 seconds standing    Hip Flexor Stretch  Right;Left;1 rep;30 seconds seated with leg extended back    Piriformis Stretch  Right;Left;2 reps;30 seconds    Gastroc Stretch  Right;Left;2 reps      Knee/Hip Exercises: Aerobic   Stationary Bike  L2: 5 min       Shoulder Exercises: Stretch   Other Shoulder Stretches  mid-level doorway stretch (bilat) x 30 sec x 2 reps      Wrist Exercises   Other wrist exercises  wrist flexion and extension stretches 20 sec x 3 each       Moist Heat Therapy   Number Minutes Moist Heat  15 Minutes    Moist Heat Location  Wrist;Knee Rt forearm, bilat knees      Electrical Stimulation   Electrical Stimulation Location  Rt dorsal and volar wrist and forearm     Electrical Stimulation Action  IFC    Electrical Stimulation Parameters  to tolerance     Electrical Stimulation Goals  Pain      Manual Therapy   Manual Therapy  Soft tissue  mobilization    Soft tissue mobilization  STM to Rt wrist flexors/ext; repeated with Edge tool assistance to decrease fascial tightness and improve ROM.         Patient educated on HEP, given handout.  Pt verbalized understanding and returned demo of HEP exercises.    PT Long Term Goals - 10/25/17 1240      PT LONG TERM GOAL #1   Title  Improve posture and alignment with patient to engage posterior shoulder girdle musculature 12/06/17    Time  6    Period  Weeks    Status  New      PT LONG TERM GOAL #2   Title  Increase ROM Rt UE shoulder and wrist to WFL's and pain free allowing improved UE function 12/06/17    Time  6    Period  Weeks    Status  New      PT LONG TERM GOAL #3   Title  Decrease pain in Rt wrist by 50-75% allowing patient to increase functional activity level 12/06/17    Time  6    Period  Weeks    Status  New      PT LONG TERM GOAL #4   Title  Increase quad extensibility allowing patient to decrease knee pain with funcitonal activities including stiars     Time  6    Period  Weeks    Status  New      PT LONG TERM GOAL #5   Title  Decrease bilat knee pai nby 50-75% allowing patient to ascend and descend 8-10 steps without difficutly 12/06/17    Time  6    Period  Weeks      Additional Long Term Goals   Additional Long Term Goals  Yes      PT LONG TERM GOAL #6   Title  Independent in HEP 12/06/17    Time  6    Period  Weeks    Status  New      PT LONG TERM GOAL #7   Title  Improve FOTO to </= 40% limitation 12/06/17    Time  6    Period  Weeks  Status  New            Plan - 10/28/17 1521    Clinical Impression Statement  Pt tolerated all exercises well; required minor cues to correct form.  Pt given alternative versions of some stretches to improve compliance.  Pt reported reduction of pain in knees and wrist at end of treatment.  Progressing towards goals.     Rehab Potential  Good    PT Frequency  2x / week    PT Duration  6 weeks    PT  Treatment/Interventions  Patient/family education;ADLs/Self Care Home Management;Cryotherapy;Iontophoresis 4mg /ml Dexamethasone;Moist Heat;Ultrasound;Dry needling;Manual techniques;Neuromuscular re-education;Therapeutic activities;Therapeutic exercise;Taping    PT Next Visit Plan  Add postural exercises to HEP; modalities and manual therapy as indicated.     Consulted and Agree with Plan of Care  Patient       Patient will benefit from skilled therapeutic intervention in order to improve the following deficits and impairments:  Impaired UE functional use, Postural dysfunction, Improper body mechanics, Increased fascial restricitons, Increased muscle spasms, Decreased range of motion, Decreased strength, Decreased mobility, Decreased activity tolerance  Visit Diagnosis: Pain in right wrist  Chronic pain of left knee  Chronic pain of right knee  Other symptoms and signs involving the musculoskeletal system  Abnormal posture     Problem List Patient Active Problem List   Diagnosis Date Noted  . Patella-femoral syndrome 10/11/2017  . RTI (respiratory tract infection) 07/19/2017  . Right hand weakness 03/07/2017  . Extensor intersection syndrome of right wrist 12/23/2016  . Hamstring tightness of right lower extremity 12/23/2016  . Carpal tunnel syndrome, right 06/07/2016  . Pain of right thumb 05/17/2016  . Paresthesia 01/20/2016  . CMC (carpometacarpal) synovitis 01/05/2016  . Medial meniscus tear 10/15/2014  . Left knee pain 09/24/2014  . Neck muscle strain 03/11/2014  . Elevated PSA 06/11/2013  . Hx of balanitis 06/11/2013  . Knee pain, bilateral 02/09/2013  . Cough 02/09/2013  . Dental infection 06/15/2012  . Dysuria 12/31/2011  . Bladder neck obstruction 10/25/2011  . Rash 07/16/2011  . Urethritis 06/01/2011  . Headache(784.0) 04/05/2011  . RLQ abdominal pain 04/05/2011  . NECK PAIN 10/19/2010  . DYSPLASTIC NEVUS, CHEST 03/23/2010  . Eczema 03/23/2010  . HYDROCELE,  RIGHT 03/03/2010  . Nocturia 03/03/2010  . Personal history of colonic polyps 03/03/2010  . RECTAL BLEEDING 01/27/2010  . Unspecified disorder of male genital organs 01/27/2010  . NONSPECIFIC ABN FINDING RAD & OTH EXAM GU ORGAN 01/27/2010  . HEMATOCHEZIA 01/23/2010  . BENIGN PROSTATIC HYPERTROPHY 01/23/2010  . HAND PAIN 01/01/2010  . B12 deficiency 06/03/2009  . WRIST PAIN 06/03/2009  . Chest pain on respiration 04/25/2009  . SHOULDER STRAIN, RIGHT 04/25/2009  . FUNGAL DERMATITIS 04/12/2009  . LOW BACK PAIN 07/29/2008  . GERD 04/30/2008  . WEIGHT LOSS, ABNORMAL 04/30/2008  . HEMORRHOIDS 04/23/2008  . SEBACEOUS CYST, INFECTED 04/23/2008  . TENDINITIS, RIGHT ELBOW 10/21/2007   Kerin Perna, PTA 10/28/17 3:26 PM  [place], [place], [address] Phone: [phone]   Fax:  [phone]  Name: Jorge Turner MRN: [id number] Date of Birth: 1956-06-25

## 2017-10-31 ENCOUNTER — Ambulatory Visit: Payer: Federal, State, Local not specified - PPO | Admitting: Internal Medicine

## 2017-11-01 ENCOUNTER — Ambulatory Visit: Payer: Federal, State, Local not specified - PPO | Admitting: Physical Therapy

## 2017-11-01 ENCOUNTER — Ambulatory Visit: Payer: Federal, State, Local not specified - PPO | Admitting: Internal Medicine

## 2017-11-01 ENCOUNTER — Encounter: Payer: Self-pay | Admitting: Internal Medicine

## 2017-11-01 DIAGNOSIS — M25562 Pain in left knee: Secondary | ICD-10-CM

## 2017-11-01 DIAGNOSIS — G8929 Other chronic pain: Secondary | ICD-10-CM

## 2017-11-01 DIAGNOSIS — M25561 Pain in right knee: Secondary | ICD-10-CM

## 2017-11-01 DIAGNOSIS — R293 Abnormal posture: Secondary | ICD-10-CM | POA: Diagnosis not present

## 2017-11-01 DIAGNOSIS — M25531 Pain in right wrist: Secondary | ICD-10-CM

## 2017-11-01 DIAGNOSIS — R1084 Generalized abdominal pain: Secondary | ICD-10-CM

## 2017-11-01 DIAGNOSIS — R29898 Other symptoms and signs involving the musculoskeletal system: Secondary | ICD-10-CM | POA: Diagnosis not present

## 2017-11-01 DIAGNOSIS — E538 Deficiency of other specified B group vitamins: Secondary | ICD-10-CM

## 2017-11-01 DIAGNOSIS — M79646 Pain in unspecified finger(s): Secondary | ICD-10-CM | POA: Diagnosis not present

## 2017-11-01 MED ORDER — METRONIDAZOLE 500 MG PO TABS: 500.0000 mg | ORAL_TABLET | Freq: Three times a day (TID) | ORAL | 0 refills | Status: DC

## 2017-11-01 MED ORDER — ALIGN 4 MG PO CAPS: 1.0000 | ORAL_CAPSULE | Freq: Every day | ORAL | 0 refills | Status: DC

## 2017-11-01 NOTE — Progress Notes (Signed)
Subjective:  Patient ID: Jorge Turner, male    DOB: 1956-04-23  Age: 61 y.o. MRN: [id number]  CC: No chief complaint on file.   HPI [name] presents for abd pain and gas on the right side. Abd CT and colonoscopy were OK. No loose stools.   Outpatient Medications Prior to Visit  Medication Sig Dispense Refill  . ANUSOL-HC 2.5 % rectal cream APPLY TO AFFECTED AREA TWICE A DAY AS NEEDED 30 g 0  . BREO ELLIPTA 100-25 MCG/INH AEPB INHALE 1 PUFF INTO THE LUNGS EVERY DAY 60 each 11  . cholecalciferol (VITAMIN D) 1000 UNITS tablet Take 1,000 Units by mouth daily.      . Cyanocobalamin (VITAMIN B-12) 1000 MCG SUBL Place 1 tablet (1,000 mcg total) under the tongue daily. 100 tablet 3  . Diclofenac Sodium 2 % SOLN Apply 1 pump twice daily. 112 g 3  . doxycycline (VIBRA-TABS) 100 MG tablet Take 1 tablet (100 mg total) by mouth 2 (two) times daily. For 7 days. 14 tablet 0  . finasteride (PROSCAR) 5 MG tablet Take 1 tablet (5 mg total) by mouth daily. 90 tablet 3  . gabapentin (NEURONTIN) 100 MG capsule Take 1 capsule (100 mg total) by mouth 3 (three) times daily as needed. 270 capsule 1  . hydrOXYzine (ATARAX/VISTARIL) 25 MG tablet TAKE 1-2 TABLETS (25-50 MG TOTAL) BY MOUTH AT BEDTIME AS NEEDED FOR ITCHING. 180 tablet 0  . hyoscyamine (LEVSIN SL) 0.125 MG SL tablet Place 1 tablet (0.125 mg total) under the tongue every 4 (four) hours as needed. 30 tablet 2  . ibuprofen (ADVIL,MOTRIN) 600 MG tablet TAKE 1 TABLET BY MOUTH TWICE A DAY 180 tablet 1  . Ibuprofen-Famotidine (DUEXIS) 800-26.6 MG TABS Take 1 tablet by mouth 2 (two) times daily as needed. 60 tablet 3  . ketoconazole (NIZORAL) 2 % cream Apply topically as needed.    . mupirocin ointment (BACTROBAN) 2 % Applied twice a day to the affected area;NOT into eyes. 15 g 0  . omeprazole (PRILOSEC) 40 MG capsule Take 1 capsule (40 mg total) by mouth daily. 90 capsule 3  . traMADol (ULTRAM) 50 MG tablet Take 1-2 tablets (50-100 mg total) by mouth  every 6 (six) hours as needed. 60 tablet 3  . triamcinolone ointment (KENALOG) 0.5 % Apply 1 application topically 2 (two) times daily. 45 g 3  . valACYclovir (VALTREX) 1000 MG tablet Take 2t po q12h times 2 doses. Repeat with next fever blister episode. 20 tablet 3   No facility-administered medications prior to visit.     ROS Review of Systems  Constitutional: Negative for appetite change, fatigue and unexpected weight change.  HENT: Negative for congestion, nosebleeds, sneezing, sore throat and trouble swallowing.   Eyes: Negative for itching and visual disturbance.  Respiratory: Negative for cough.   Cardiovascular: Negative for chest pain, palpitations and leg swelling.  Gastrointestinal: Positive for abdominal distention, abdominal pain and constipation. Negative for blood in stool, diarrhea, rectal pain and vomiting.  Genitourinary: Negative for frequency and hematuria.  Musculoskeletal: Negative for back pain, gait problem, joint swelling and neck pain.  Skin: Negative for rash.  Neurological: Negative for dizziness, tremors, speech difficulty and weakness.  Psychiatric/Behavioral: Negative for agitation, dysphoric mood and sleep disturbance. The patient is not nervous/anxious.     Objective:  BP 108/84 (BP Location: Left Arm, Patient Position: Sitting, Cuff Size: Large)   Pulse (!) 58   Temp 98.2 F (36.8 C) (Oral)   Ht 5'  10" (1.778 m)   Wt 165 lb (74.8 kg)   SpO2 99%   BMI 23.68 kg/m   BP Readings from Last 3 Encounters:  11/01/17 108/84  10/11/17 110/82  09/13/17 118/72    Wt Readings from Last 3 Encounters:  11/01/17 165 lb (74.8 kg)  10/11/17 164 lb (74.4 kg)  09/13/17 162 lb (73.5 kg)    Physical Exam  Constitutional: He is oriented to person, place, and time. He appears well-developed. No distress.  NAD  HENT:  Mouth/Throat: Oropharynx is clear and moist.  Eyes: Conjunctivae are normal. Pupils are equal, round, and reactive to light.  Neck: Normal  range of motion. No JVD present. No thyromegaly present.  Cardiovascular: Normal rate, regular rhythm, normal heart sounds and intact distal pulses. Exam reveals no gallop and no friction rub.  No murmur heard. Pulmonary/Chest: Effort normal and breath sounds normal. No respiratory distress. He has no wheezes. He has no rales. He exhibits no tenderness.  Abdominal: Soft. Bowel sounds are normal. He exhibits no distension and no mass. There is no tenderness. There is no rebound and no guarding.  Musculoskeletal: Normal range of motion. He exhibits no edema or tenderness.  Lymphadenopathy:    He has no cervical adenopathy.  Neurological: He is alert and oriented to person, place, and time. He has normal reflexes. No cranial nerve deficit. He exhibits normal muscle tone. He displays a negative Romberg sign. Coordination and gait normal.  Skin: Skin is warm and dry. No rash noted.  Psychiatric: He has a normal mood and affect. His behavior is normal. Judgment and thought content normal.  L thumb is sensitive at base  Lab Results  Component Value Date   WBC 3.1 (L) 08/10/2017   HGB 14.2 08/10/2017   HCT 43.6 08/10/2017   PLT 238.0 08/10/2017   GLUCOSE 89 08/10/2017   CHOL 218 (H) 11/11/2016   TRIG 127.0 11/11/2016   HDL 55.50 11/11/2016   LDLDIRECT 140.3 10/26/2011   LDLCALC 137 (H) 11/11/2016   ALT 11 08/10/2017   AST 12 08/10/2017   NA 142 08/10/2017   K 4.7 08/10/2017   CL 109 08/10/2017   CREATININE 1.42 08/10/2017   BUN 24 (H) 08/10/2017   CO2 26 08/10/2017   TSH 2.94 11/11/2016   PSA 5.28 (H) 11/11/2016    Ct Abdomen Pelvis W Contrast  Result Date: 08/10/2017 CLINICAL DATA:  Worsening RIGHT lower quadrant pain over 3-4 days question appendicitis EXAM: CT ABDOMEN AND PELVIS WITH CONTRAST TECHNIQUE: Multidetector CT imaging of the abdomen and pelvis was performed using the standard protocol following bolus administration of intravenous contrast. CONTRAST:  145mL ISOVUE-300  IOPAMIDOL (ISOVUE-300) INJECTION 61% IV. Dilute oral contrast. COMPARISON:  None FINDINGS: Lower chest: Lung bases clear Hepatobiliary: Tiny hypervascular nodule posterior RIGHT lobe liver 7 mm diameter question atypical hemangioma. Additional tiny hepatic cysts. Gallbladder and liver otherwise normal appearance. Pancreas: Normal appearance Spleen: Normal appearance Adrenals/Urinary Tract: Adrenal glands normal appearance. Nonobstructing 5 mm LEFT renal calculus image 28. Small LEFT renal cysts. No hydronephrosis or hydroureter. Bladder contains only a small amount of urine, incompletely distended, limiting assessment of wall thickness. Stomach/Bowel: Normal appendix. Scattered descending colonic diverticulosis. Retained stool in colon limits assessment for masses. Questionable small cecal nodule versus stool artifact 11 mm diameter, having a different appearance from stool throughout remainder of colon. Stomach and bowel loops otherwise normal appearance. Vascular/Lymphatic: Aorta normal caliber without aneurysm. No definite adenopathy. Reproductive: Prostatic enlargement 5.5 x 4.3 x 4.1 cm. Seminal vesicles  unremarkable. Other: No free air or free fluid. No inflammatory process or definite hernia. Musculoskeletal: Unremarkable IMPRESSION: Normal appendix. Small cysts and nonobstructing calculus in LEFT kidney. Scattered colonic diverticulosis without definite evidence of diverticulitis. Cannot exclude 11 mm diameter nodule/polyp at cecum ; followup non emergent colonoscopy recommended to exclude colonic polyp. Prostatic enlargement. Electronically Signed   By: Lavonia Dana M.D.   On: 08/10/2017 16:33    Assessment & Plan:   There are no diagnoses linked to this encounter. I am having Jorge Turner maintain his cholecalciferol, Vitamin B-12, ketoconazole, mupirocin ointment, gabapentin, ANUSOL-HC, valACYclovir, traMADol, finasteride, Ibuprofen-Famotidine, triamcinolone ointment, hydrOXYzine, omeprazole, BREO  ELLIPTA, hyoscyamine, doxycycline, Diclofenac Sodium, and ibuprofen.  No orders of the defined types were placed in this encounter.    Follow-up: No Follow-up on file.  Walker Kehr, MD

## 2017-11-01 NOTE — Patient Instructions (Addendum)
Radial Deviation: Resisted    With tubing wrapped around left fist and other end secured under foot, bend wrist up (thumb side up) as far as possible. Keep forearm on thigh. Repeat _10___ times per set. Do __2-3__ sets per session. Do _1___ sessions per day.  Wrist Extension: Resisted    With tubing wrapped around left fist and other end secured under foot, bend wrist up (palm down) as far as possible. Keep forearm on thigh. Repeat __10__ times per set. Do _2-3___ sets per session. Do _1___ sessions per day.   Kinesiology tape What is kinesiology tape?  There are many brands of kinesiology tape.  KTape, [employer], [employer], Dynamic tape, to name a few. It is an elasticized tape designed to support the body's natural healing process. This tape provides stability and support to muscles and joints without restricting motion. It can also help decrease swelling in the area of application. How does it work? The tape microscopically lifts and decompresses the skin to allow for drainage of lymph (swelling) to flow away from area, reducing inflammation.  The tape has the ability to help re-educate the neuromuscular system by targeting specific receptors in the skin.  The presence of the tape increases the body's awareness of posture and body mechanics.  Do not use with: . Open wounds . Skin lesions . Adhesive allergies Safe removal of the tape: In some rare cases, mild/moderate skin irritation can occur.  This can include redness, itchiness, or hives. If this occurs, immediately remove tape and consult your primary care physician if symptoms are severe or do not resolve within 2 days.  To remove tape safely, hold nearby skin with one hand and gentle roll tape down with other hand.  You can apply oil or conditioner to tape while in shower prior to removal to loosen adhesive.  DO NOT swiftly rip tape off like a band-aid, as this could cause skin tears and additional skin irritation.

## 2017-11-01 NOTE — Assessment & Plan Note (Signed)
R wrist pain - in PT 12/18 L thumb pain - ACE wrap

## 2017-11-01 NOTE — Assessment & Plan Note (Addendum)
CT abd/colonoscopy 12/18 Flagyl/Align and gluten free diet Linzess if not better

## 2017-11-01 NOTE — Assessment & Plan Note (Signed)
On B12 

## 2017-11-01 NOTE — Patient Instructions (Signed)
  Gluten free trial for 4-6 weeks. OK to use gluten-free bread and gluten-free pasta.    Gluten-Free Diet for Celiac Disease, Adult The gluten-free diet includes all foods that do not contain gluten. Gluten is a protein that is found in wheat, rye, barley, and some other grains. Following the gluten-free diet is the only treatment for people with celiac disease. It helps to prevent damage to the intestines and improves or eliminates the symptoms of celiac disease. Following the gluten-free diet requires some planning. It can be challenging at first, but it gets easier with time and practice. There are more gluten-free options available today than ever before. If you need help finding gluten-free foods or if you have questions, talk with your diet and nutrition specialist (registered dietitian) or your health care provider. What do I need to know about a gluten-free diet?  All fruits, vegetables, and meats are safe to eat and do not contain gluten.  When grocery shopping, start by shopping in the produce, meat, and dairy sections. These sections are more likely to contain gluten-free foods. Then move to the aisles that contain packaged foods if you need to.  Read all food labels. Gluten is often added to foods. Always check the ingredient list and look for warnings, such as "may contain gluten."  Talk with your dietitian or health care provider before taking a gluten-free multivitamin or mineral supplement.  Be aware of gluten-free foods having contact with foods that contain gluten (cross-contamination). This can happen at home and with any processed foods. ? Talk with your health care provider or dietitian about how to reduce the risk of cross-contamination in your home. ? If you have questions about how a food is processed, ask the manufacturer. What key words help to identify gluten? Foods that list any of these key words on the label usually contain gluten:  Wheat, flour, enriched  flour, bromated flour, white flour, durum flour, graham flour, phosphated flour, self-rising flour, semolina, farina, barley (malt), rye, and oats.  Starch, dextrin, modified food starch, or cereal.  Thickening, fillers, or emulsifiers.  Malt flavoring, malt extract, or malt syrup.  Hydrolyzed vegetable protein.  In the [place] packaged foods that are gluten-free are required to be labeled "GF." These foods should be easy to identify and are safe to eat. In the [place] food companies are also required to list common food allergens, including wheat, on their labels. Recommended foods Grains  Amaranth, bean flours, 100% buckwheat flour, corn, millet, nut flours or nut meals, GF oats, quinoa, rice, sorghum, teff, rice wafers, pure cornmeal tortillas, popcorn, and hot cereals made from cornmeal. Hominy, rice, wild rice. Some Asian rice noodles or bean noodles. Arrowroot starch, corn bran, corn flour, corn germ, cornmeal, corn starch, potato flour, potato starch flour, and rice bran. Plain, brown, and sweet rice flours. Rice polish, soy flour, and tapioca starch. Vegetables  All plain fresh, frozen, and canned vegetables. Fruits  All plain fresh, frozen, canned, and dried fruits, and 100% fruit juices. Meats and other protein foods  All fresh beef, pork, poultry, fish, seafood, and eggs. Fish canned in water, oil, brine, or vegetable broth. Plain nuts and seeds, peanut butter. Some lunch meat and some frankfurters. Dried beans, dried peas, and lentils. Dairy  Fresh plain, dry, evaporated, or condensed milk. Cream, butter, sour cream, whipping cream, and most yogurts. Unprocessed cheese, most processed cheeses, some cottage cheese, some cream cheeses. Beverages  Coffee, tea, most herbal teas. Carbonated beverages and some root beers.   Wine, sake, and distilled spirits, such as gin, vodka, and whiskey. Most hard ciders. Fats and oils  Butter, margarine, vegetable oil, hydrogenated butter, olive  oil, shortening, lard, cream, and some mayonnaise. Some commercial salad dressings. Olives. Sweets and desserts  Sugar, honey, some syrups, molasses, jelly, and jam. Plain hard candy, marshmallows, and gumdrops. Pure cocoa powder. Plain chocolate. Custard and some pudding mixes. Gelatin desserts, sorbets, frozen ice pops, and sherbet. Cake, cookies, and other desserts prepared with allowed flours. Some commercial ice creams. Cornstarch, tapioca, and rice puddings. Seasoning and other foods  Some canned or frozen soups. Monosodium glutamate (MSG). Cider, rice, and wine vinegar. Baking soda and baking powder. Cream of tartar. Baking and nutritional yeast. Certain soy sauces made without wheat (ask your dietitian about specific brands that are allowed). Nuts, coconut, and chocolate. Salt, pepper, herbs, spices, flavoring extracts, imitation or artificial flavorings, natural flavorings, and food colorings. Some medicines and supplements. Some lip glosses and other cosmetics. Rice syrups. The items listed may not be a complete list. Talk with your dietitian about what dietary choices are best for you. Foods to avoid Grains  Barley, bran, bulgur, couscous, cracked wheat, [place], farro, graham, malt, matzo, semolina, wheat germ, and all wheat and rye cereals including spelt and kamut. Cereals containing malt as a flavoring, such as rice cereal. Noodles, spaghetti, macaroni, most packaged rice mixes, and all mixes containing wheat, rye, barley, or triticale. Vegetables  Most creamed vegetables and most vegetables canned in sauces. Some commercially prepared vegetables and salads. Fruits  Thickened or prepared fruits and some pie fillings. Some fruit snacks and fruit roll-ups. Meats and other protein foods  Any meat or meat alternative containing wheat, rye, barley, or gluten stabilizers. These are often marinated or packaged meats and lunch meats. Bread-containing products, such as Swiss steak,  croquettes, meatballs, and meatloaf. Most tuna canned in vegetable broth and turkey with hydrolyzed vegetable protein (HVP) injected as part of the basting. Seitan. Imitation fish. Eggs in sauces made from ingredients to avoid. Dairy  Commercial chocolate milk drinks and malted milk. Some non-dairy creamers. Any cheese product containing ingredients to avoid. Beverages  Certain cereal beverages. Beer, ale, malted milk, and some root beers. Some hard ciders. Some instant flavored coffees. Some herbal teas made with barley or with barley malt added. Fats and oils  Some commercial salad dressings. Sour cream containing modified food starch. Sweets and desserts  Some toffees. Chocolate-coated nuts (may be rolled in wheat flour) and some commercial candies and candy bars. Most cakes, cookies, donuts, pastries, and other baked goods. Some commercial ice cream. Ice cream cones. Commercially prepared mixes for cakes, cookies, and other desserts. Bread pudding and other puddings thickened with flour. Products containing brown rice syrup made with barley malt enzyme. Desserts and sweets made with malt flavoring. Seasoning and other foods  Some curry powders, some dry seasoning mixes, some gravy extracts, some meat sauces, some ketchups, some prepared mustards, and horseradish. Certain soy sauces. Malt vinegar. Bouillon and bouillon cubes that contain HVP. Some chip dips, and some chewing gum. Yeast extract. Brewer's yeast. Caramel color. Some medicines and supplements. Some lip glosses and other cosmetics. The items listed may not be a complete list. Talk with your dietitian about what dietary choices are best for you. Summary  Gluten is a protein that is found in wheat, rye, barley, and some other grains. The gluten-free diet includes all foods that do not contain gluten.  If you need help finding gluten-free foods or if   you have questions, talk with your diet and nutrition specialist (registered  dietitian) or your health care provider.  Read all food labels. Gluten is often added to foods. Always check the ingredient list and look for warnings, such as "may contain gluten." This information is not intended to replace advice given to you by your health care provider. Make sure you discuss any questions you have with your health care provider. Document Released: 11/15/2005 Document Revised: 08/30/2016 Document Reviewed: 08/30/2016 Elsevier Interactive Patient Education  2018 [employer] Inc[employer]   

## 2017-11-01 NOTE — Therapy (Signed)
[facility], [place], [address] Phone: [phone]   Fax:  [phone]  Physical Therapy Treatment  Patient Details  Name: Jorge Turner MRN: [id number] Date of Birth: Nov 16, 1956 Referring Provider: Dr. Hulan Saas    Encounter Date: 11/01/2017  PT End of Session - 11/01/17 1154    Visit Number  3    Number of Visits  12    Date for PT Re-Evaluation  12/06/17    PT Start Time  1106    PT Stop Time  1204    PT Time Calculation (min)  58 min    Activity Tolerance  Patient tolerated treatment well    Behavior During Therapy  [facility] for tasks assessed/performed       Past Medical History:  Diagnosis Date  . Balanitis   . BPH (benign prostatic hypertrophy)   . DJD (degenerative joint disease) of knee   . Eczema   . GERD (gastroesophageal reflux disease)   . Hemorrhoid   . Low back pain   . Personal history of colonic polyps 03/03/2010  . Vitamin B12 deficiency     Past Surgical History:  Procedure Laterality Date  . CIRCUMCISION N/A 07/20/2013   Procedure: CIRCUMCISION ADULT;  Surgeon: Claybon Jabs, MD;  Location: [facility];  Service: Urology;  Laterality: N/A;  . COLONOSCOPY    . CYSTO REMOVAL RIGHT THUMB  2006  . RIGHT HYDROCELECTOMY  11-13-2010    There were no vitals filed for this visit.  Subjective Assessment - 11/01/17 1122    Subjective  Pt reports he had some relief from manual work to forearm last session.  His hamstrings have felt stiff. He has been off of work the last few days.      Patient Stated Goals  patient wants ot get wrist and knees stronger and less painful so he can use wrist and knees for normal function - avoid surgery.  Return to bowling     Currently in Pain?  Yes    Pain Score  5     Pain Location  Wrist    Pain Orientation  Right    Pain Descriptors / Indicators  Sore;Nagging    Aggravating Factors   gripping and lifting heavy items    Pain  Relieving Factors  brace, medicine    Pain Score  4    Pain Location  Knee    Pain Orientation  Right;Left    Aggravating Factors   stairs    Pain Relieving Factors  medication         [facility] PT Assessment - 11/01/17 0001      Assessment   Medical Diagnosis  Rt carpal tunnel syndrome; Bilat patellofemoral syndrome    Referring Provider  Dr. Hulan Saas     Onset Date/Surgical Date  03/29/17    Hand Dominance  Right    Next MD Visit  12/18      AROM   Right Wrist Extension  62 Degrees    Right Wrist Flexion  60 Degrees        [facility] Adult PT Treatment/Exercise - 11/01/17 0001      Knee/Hip Exercises: Stretches   Passive Hamstring Stretch  Right;Left;2 reps;30 seconds    Quad Stretch  Right;Left;2 reps;30 seconds standing    Gastroc Stretch  Right;Left;2 reps      Knee/Hip Exercises: Aerobic   Nustep  L5: arms/legs x 5 min  Shoulder Exercises: Stretch   Other Shoulder Stretches  mid-level doorway stretch (bilat) x 30 sec x 2 reps      Wrist Exercises   Wrist Radial Deviation  Strengthening;Right;20 reps;Seated;Bar weights/barbell    Bar Weights/Barbell (Radial Deviation)  1 lb    Other wrist exercises  wrist flexion and extension stretches 20 sec x 3 each     Other wrist exercises  Rt wrist flexion/ ext strengthening with 1# wt x 20 reps       Moist Heat Therapy   Number Minutes Moist Heat  15 Minutes    Moist Heat Location  Wrist;Knee Rt forearm, bilat knees      Electrical Stimulation   Electrical Stimulation Location  Rt dorsal and volar wrist and forearm     Electrical Stimulation Action  IFC    Electrical Stimulation Parameters  to tolerance     Electrical Stimulation Goals  Pain      Manual Therapy   Manual Therapy  Taping    Manual therapy comments  Black reg Rock tape applied with 10% stretch to Rt distal to mid radius to decrease pain, decompress tissue, faciliate thumb abd, wrist radial deviation.  Short I strip applied with 50% in center to distal  ulna.     Soft tissue mobilization  STM to Rt wrist flexors/ext, Rt pronators to decrease fascial tightness and improve ROM.              PT Education - 11/01/17 1141    Education provided  Yes    Education Details  HEP, kinesiotape info.      Person(s) Educated  Patient    Methods  Explanation;Handout;Verbal cues;Demonstration    Comprehension  Verbalized understanding;Returned demonstration          PT Long Term Goals - 11/01/17 1126      PT LONG TERM GOAL #1   Title  Improve posture and alignment with patient to engage posterior shoulder girdle musculature 12/06/17    Time  6    Period  Weeks    Status  On-going      PT LONG TERM GOAL #2   Title  Increase ROM Rt UE shoulder and wrist to WFL's and pain free allowing improved UE function 12/06/17    Time  6    Period  Weeks    Status  On-going      PT LONG TERM GOAL #3   Title  Decrease pain in Rt wrist by 50-75% allowing patient to increase functional activity level 12/06/17 pt reports 10% improvement.     Period  Weeks    Status  On-going      PT LONG TERM GOAL #4   Title  Increase quad extensibility allowing patient to decrease knee pain with funcitonal activities including stiars     Time  6    Period  Weeks    Status  Partially Met able to get heel to buttocks in standing quad stretch      PT LONG TERM GOAL #5   Title  Decrease bilat knee pain by 50-75% allowing patient to ascend and descend 8-10 steps without difficutly 12/06/17    Time  6    Period  Weeks      PT LONG TERM GOAL #6   Title  Independent in HEP 12/06/17      PT LONG TERM GOAL #7   Title  Improve FOTO to </= 40% limitation 12/06/17    Time  6  Period  Weeks    Status  On-going            Plan - 11/01/17 1133    Clinical Impression Statement  Rt wrist flexion ROM improved; wrist ext continues to be limited and painful.  Pt reporting 10% improvement in knee pain. Pt tolerated all exercises with min increase in pain.  Pt making progress  towards goals.     Rehab Potential  Good    Clinical Impairments Affecting Rehab Potential  chronic nature of problems     PT Frequency  2x / week    PT Duration  6 weeks    PT Treatment/Interventions  Patient/family education;ADLs/Self Care Home Management;Cryotherapy;Iontophoresis 24m/ml Dexamethasone;Moist Heat;Ultrasound;Dry needling;Manual techniques;Neuromuscular re-education;Therapeutic activities;Therapeutic exercise;Taping    PT Next Visit Plan  cont progressive Rt wrist and bilat knee strengthening/stretching. Assess response to Rock tape and reapply if helpful.     Consulted and Agree with Plan of Care  Patient       Patient will benefit from skilled therapeutic intervention in order to improve the following deficits and impairments:  Impaired UE functional use, Postural dysfunction, Improper body mechanics, Increased fascial restricitons, Increased muscle spasms, Decreased range of motion, Decreased strength, Decreased mobility, Decreased activity tolerance  Visit Diagnosis: Pain in right wrist  Chronic pain of left knee  Chronic pain of right knee  Other symptoms and signs involving the musculoskeletal system  Abnormal posture     Problem List Patient Active Problem List   Diagnosis Date Noted  . Patella-femoral syndrome 10/11/2017  . RTI (respiratory tract infection) 07/19/2017  . Right hand weakness 03/07/2017  . Extensor intersection syndrome of right wrist 12/23/2016  . Hamstring tightness of right lower extremity 12/23/2016  . Carpal tunnel syndrome, right 06/07/2016  . Thumb pain 05/17/2016  . Paresthesia 01/20/2016  . CMC (carpometacarpal) synovitis 01/05/2016  . Medial meniscus tear 10/15/2014  . Left knee pain 09/24/2014  . Neck muscle strain 03/11/2014  . Elevated PSA 06/11/2013  . Hx of balanitis 06/11/2013  . Knee pain, bilateral 02/09/2013  . Cough 02/09/2013  . Dental infection 06/15/2012  . Dysuria 12/31/2011  . Bladder neck obstruction  10/25/2011  . Rash 07/16/2011  . Urethritis 06/01/2011  . Headache(784.0) 04/05/2011  . Abdominal pain 04/05/2011  . NECK PAIN 10/19/2010  . DYSPLASTIC NEVUS, CHEST 03/23/2010  . Eczema 03/23/2010  . HYDROCELE, RIGHT 03/03/2010  . Nocturia 03/03/2010  . Personal history of colonic polyps 03/03/2010  . RECTAL BLEEDING 01/27/2010  . Unspecified disorder of male genital organs 01/27/2010  . NONSPECIFIC ABN FINDING RAD & OTH EXAM GU ORGAN 01/27/2010  . HEMATOCHEZIA 01/23/2010  . BENIGN PROSTATIC HYPERTROPHY 01/23/2010  . HAND PAIN 01/01/2010  . B12 deficiency 06/03/2009  . WRIST PAIN 06/03/2009  . Chest pain on respiration 04/25/2009  . SHOULDER STRAIN, RIGHT 04/25/2009  . FUNGAL DERMATITIS 04/12/2009  . LOW BACK PAIN 07/29/2008  . GERD 04/30/2008  . WEIGHT LOSS, ABNORMAL 04/30/2008  . HEMORRHOIDS 04/23/2008  . SEBACEOUS CYST, INFECTED 04/23/2008  . TENDINITIS, RIGHT ELBOW 10/21/2007   JKerin Perna PTA 11/01/17 12:39 PM  [address] [place] [address]Phone: [phone]  Fax:  [phone] Name: TCAYMAN KIELBASAMRN: [id number] of Birth: 307-18-1957

## 2017-11-04 ENCOUNTER — Ambulatory Visit: Payer: Federal, State, Local not specified - PPO | Admitting: Physical Therapy

## 2017-11-04 ENCOUNTER — Ambulatory Visit (INDEPENDENT_AMBULATORY_CARE_PROVIDER_SITE_OTHER): Payer: Federal, State, Local not specified - PPO | Admitting: Specialist

## 2017-11-04 ENCOUNTER — Ambulatory Visit (INDEPENDENT_AMBULATORY_CARE_PROVIDER_SITE_OTHER): Payer: Federal, State, Local not specified - PPO

## 2017-11-04 ENCOUNTER — Encounter (INDEPENDENT_AMBULATORY_CARE_PROVIDER_SITE_OTHER): Payer: Self-pay | Admitting: Specialist

## 2017-11-04 VITALS — BP 119/79 | HR 54 | Ht 70.0 in | Wt 164.0 lb

## 2017-11-04 DIAGNOSIS — R29898 Other symptoms and signs involving the musculoskeletal system: Secondary | ICD-10-CM | POA: Diagnosis not present

## 2017-11-04 DIAGNOSIS — M19131 Post-traumatic osteoarthritis, right wrist: Secondary | ICD-10-CM | POA: Diagnosis not present

## 2017-11-04 DIAGNOSIS — G8929 Other chronic pain: Secondary | ICD-10-CM

## 2017-11-04 DIAGNOSIS — M170 Bilateral primary osteoarthritis of knee: Secondary | ICD-10-CM | POA: Diagnosis not present

## 2017-11-04 DIAGNOSIS — M25562 Pain in left knee: Secondary | ICD-10-CM

## 2017-11-04 DIAGNOSIS — M25531 Pain in right wrist: Secondary | ICD-10-CM | POA: Diagnosis not present

## 2017-11-04 DIAGNOSIS — M5136 Other intervertebral disc degeneration, lumbar region: Secondary | ICD-10-CM | POA: Diagnosis not present

## 2017-11-04 DIAGNOSIS — M25561 Pain in right knee: Secondary | ICD-10-CM | POA: Diagnosis not present

## 2017-11-04 DIAGNOSIS — M931 Kienbock's disease of adults: Secondary | ICD-10-CM | POA: Diagnosis not present

## 2017-11-04 MED ORDER — MELOXICAM 15 MG PO TABS: 15.0000 mg | ORAL_TABLET | Freq: Every day | ORAL | 3 refills | Status: DC

## 2017-11-04 NOTE — Progress Notes (Signed)
Office Visit Note   Patient: Jorge Turner           Date of Birth: 1956/10/31           MRN: [id number] Visit Date: 11/04/2017              Requested by: [name], MD [place], [place] [address] PCP: [name], MD   Assessment & Plan: Visit Diagnoses:  1. Chronic pain of both knees   2. Pain in right wrist     Plan: Knee is suffering from osteoarthritis, only real proven treatments are Weight loss, NSIADs like meloxicam and exercise. Well padded shoes help. Ice the knee 2-3 times a day 15-20 mins at a time. Meloxicam 15mg  po q day for arthritis.  Right wrist strengthening exercises, and grip strengthening. Back exercises 10-5minutes   Avoid frequent bending and stooping  No lifting greater than 10 lbs. May use ice or moist heat for pain. Weight loss is of benefit.   Follow-Up Instructions: No Follow-up on file.   Orders:  Orders Placed This Encounter  Procedures  . XR KNEE 3 VIEW RIGHT  . XR KNEE 3 VIEW LEFT   No orders of the defined types were placed in this encounter.     Procedures: No procedures performed   Clinical Data: No additional findings.   Subjective: Chief Complaint  Patient presents with  . Right Wrist - Pain  . Left Knee - Pain  . 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 He does go to the [facility] Not seen a doctor at the [facility] yet and is planning to see a physician in the next several weeks. He has seen a sports medicine specialist, and had eval and treatment with surgery in January 2016, with repair of the medial cartilage left knee, with arthroscopy Dr. Hulan Saas Sports Medicine with [place], at [facility] by Dr. Latanya Maudlin. Post the  arthroscopsy he still experiences pain over the anterior proximal knee pain above the knee cap. He has 3  flights of stairs at work to negotiate without an Media planner, then 4 flights of stairs to get to the tower. He has pain with stairs and with squatting and kneeling. He notices some numbness in the hands and does have CTS and has had EMG/NCV with Dr. Delice Lesch. He has started Pennsaid and ibuprofen.  He does use a splint for the right wrist and has started PT for the knees. He would like to avoid injection if possible. There is night pain, he gets up about  3 times a night with knee stiffness and also with the finger tingling, right radial and some intermittant right shoulder pain. Had previous U/S demonstrating scar about the right wrist from the previous fracture.     Review of Systems  Constitutional: Positive for activity change and unexpected weight change.  HENT: Positive for hearing loss. Negative for congestion, dental problem, drooling, ear discharge, ear pain, facial swelling, mouth sores, nosebleeds, postnasal drip, rhinorrhea, sinus pressure, sinus pain, sneezing, tinnitus, trouble swallowing and voice change.   Eyes: Negative.  Negative for photophobia, pain, discharge, redness, itching and visual disturbance.  Respiratory: Positive for shortness of breath. Negative for apnea, cough, choking, chest tightness, wheezing and stridor.   Cardiovascular: Negative.  Negative for chest pain, palpitations and leg swelling.  Gastrointestinal: Positive for abdominal pain. Negative for abdominal distention, anal bleeding, blood in stool, constipation, diarrhea, nausea, rectal pain and vomiting.  Endocrine: Negative.  Negative for cold intolerance, heat intolerance, polydipsia, polyphagia and polyuria.  Genitourinary: Positive for urgency. Negative for difficulty urinating, dysuria, enuresis, flank pain, frequency and hematuria.  Musculoskeletal: Positive for arthralgias, back pain, gait problem and joint swelling.  Skin: Positive for rash. Negative for color change, pallor and wound.    Allergic/Immunologic: Positive for environmental allergies. Negative for food allergies.  Neurological: Positive for syncope and numbness. Negative for dizziness, tremors, seizures, facial asymmetry, speech difficulty, weakness, light-headedness and headaches.  Hematological: Negative.  Negative for adenopathy. Does not bruise/bleed easily.  Psychiatric/Behavioral: Negative.  Negative for agitation, behavioral problems, confusion, decreased concentration, dysphoric mood, hallucinations, self-injury, sleep disturbance and suicidal ideas. The patient is not nervous/anxious and is not hyperactive.      Objective: Vital Signs: BP 119/79   Pulse (!) 54   Ht 5\' 10"  (1.778 m)   Wt 164 lb (74.4 kg)   BMI 23.53 kg/m   Physical Exam  Constitutional: He is oriented to person, place, and time. He appears well-developed and well-nourished.  HENT:  Head: Normocephalic and atraumatic.  Eyes: EOM are normal. Pupils are equal, round, and reactive to light.  Neck: Normal range of motion. Neck supple.  Pulmonary/Chest: Effort normal and breath sounds normal.  Abdominal: Soft. Bowel sounds are normal.  Musculoskeletal: Normal range of motion.  Neurological: He is alert and oriented to person, place, and time.  Skin: Skin is warm and dry.  Psychiatric: He has a normal mood and affect. His behavior is normal. Judgment and thought content normal.    Ortho Exam  Specialty Comments:  No specialty comments available.  Imaging: No results found.   PMFS History: Patient Active Problem List   Diagnosis Date Noted  . Patella-femoral syndrome 10/11/2017  . RTI (respiratory tract infection) 07/19/2017  . Right hand weakness 03/07/2017  . Extensor intersection syndrome of right wrist 12/23/2016  . Hamstring tightness of right lower extremity 12/23/2016  . Carpal tunnel syndrome, right 06/07/2016  . Thumb pain 05/17/2016  . Paresthesia 01/20/2016  . CMC (carpometacarpal) synovitis 01/05/2016  .  Medial meniscus tear 10/15/2014  . Left knee pain 09/24/2014  . Neck muscle strain 03/11/2014  . Elevated PSA 06/11/2013  . Hx of balanitis 06/11/2013  . Knee pain, bilateral 02/09/2013  . Cough 02/09/2013  . Dental infection 06/15/2012  . Dysuria 12/31/2011  . Bladder neck obstruction 10/25/2011  . Rash 07/16/2011  . Urethritis 06/01/2011  . Headache(784.0) 04/05/2011  . Abdominal pain 04/05/2011  . NECK PAIN 10/19/2010  . DYSPLASTIC NEVUS, CHEST 03/23/2010  . Eczema 03/23/2010  . HYDROCELE, RIGHT 03/03/2010  . Nocturia 03/03/2010  . Personal history of colonic polyps 03/03/2010  . RECTAL BLEEDING 01/27/2010  . Unspecified disorder of male genital organs 01/27/2010  . NONSPECIFIC ABN FINDING RAD & OTH EXAM GU ORGAN 01/27/2010  . HEMATOCHEZIA 01/23/2010  . BENIGN PROSTATIC HYPERTROPHY 01/23/2010  . HAND PAIN 01/01/2010  . B12 deficiency 06/03/2009  . WRIST PAIN 06/03/2009  . Chest pain on respiration 04/25/2009  . SHOULDER STRAIN, RIGHT 04/25/2009  . FUNGAL DERMATITIS 04/12/2009  . LOW BACK PAIN 07/29/2008  . GERD 04/30/2008  . WEIGHT LOSS, ABNORMAL 04/30/2008  . HEMORRHOIDS 04/23/2008  . SEBACEOUS CYST, INFECTED 04/23/2008  . TENDINITIS, RIGHT ELBOW 10/21/2007   Past Medical History:  Diagnosis Date  . Balanitis   . BPH (benign prostatic hypertrophy)   .  DJD (degenerative joint disease) of knee   . Eczema   . GERD (gastroesophageal reflux disease)   . Hemorrhoid   . Low back pain   . Personal history of colonic polyps 03/03/2010  . Vitamin B12 deficiency     Family History  Problem Relation Age of Onset  . Hypertension Mother   . Diabetes Mother   . Hypertension Father   . Diabetes Father   . Colon cancer Neg Hx   . Rectal cancer Neg Hx   . Stomach cancer Neg Hx     Past Surgical History:  Procedure Laterality Date  . CIRCUMCISION N/A 07/20/2013   Procedure: CIRCUMCISION ADULT;  Surgeon: Claybon Jabs, MD;  Location: [facility];  Service:  Urology;  Laterality: N/A;  . COLONOSCOPY    . CYSTO REMOVAL RIGHT THUMB  2006  . RIGHT HYDROCELECTOMY  11-13-2010   Social History   Occupational History  . Occupation: Programmer, applications: RETIRED  Tobacco Use  . Smoking status: Never Smoker  . Smokeless tobacco: Never Used  Substance and Sexual Activity  . Alcohol use: No  . Drug use: No  . Sexual activity: Yes

## 2017-11-04 NOTE — Patient Instructions (Addendum)
  Knee is suffering from osteoarthritis, only real proven treatments are Weight loss, NSIADs like meloxicam and exercise. Well padded shoes help. Ice the knee 2-3 times a day 15-20 mins at a time. Meloxicam 15mg  po q day for arthritis.  Right wrist strengthening exercises, and grip strengthening. Back exercises 10-52minutes   Avoid frequent bending and stooping  No lifting greater than 10 lbs. May use ice or moist heat for pain. Weight loss is of benefit.

## 2017-11-04 NOTE — Therapy (Signed)
[facility] [place], [place], [address] Phone: [phone]   Fax:  [phone]  Physical Therapy Treatment  Patient Details  Name: Jorge Turner MRN: [id number] Date of Birth: 09-21-1956 Referring Provider: Dr. Hulan Saas    Encounter Date: 11/04/2017  PT End of Session - 11/04/17 1410    Visit Number  4    Number of Visits  12    Date for PT Re-Evaluation  12/06/17    PT Start Time  1093    PT Stop Time  1503    PT Time Calculation (min)  60 min    Activity Tolerance  Patient tolerated treatment well    Behavior During Therapy  [facility] for tasks assessed/performed       Past Medical History:  Diagnosis Date  . Balanitis   . BPH (benign prostatic hypertrophy)   . DJD (degenerative joint disease) of knee   . Eczema   . GERD (gastroesophageal reflux disease)   . Hemorrhoid   . Low back pain   . Personal history of colonic polyps 03/03/2010  . Vitamin B12 deficiency     Past Surgical History:  Procedure Laterality Date  . CIRCUMCISION N/A 07/20/2013   Procedure: CIRCUMCISION ADULT;  Surgeon: Claybon Jabs, MD;  [facility]  Service: Urology;  Laterality: N/A;  . COLONOSCOPY    . CYSTO REMOVAL RIGHT THUMB  2006  . RIGHT HYDROCELECTOMY  11-13-2010    There were no vitals filed for this visit.  Subjective Assessment - 11/04/17 1410    Subjective  Pt reports he had quite a bit of pain in Rt wrist the last 2 days.  He is back to work; has to go up and down stairs frequently.  He saw Dr. Louanne Skye today; recommends continued PT intervention. He's not sure if the tape helped or hindered.     Patient Stated Goals  patient wants ot get wrist and knees stronger and less painful so he can use wrist and knees for normal function - avoid surgery.  Return to bowling     Currently in Pain?  Yes    Pain Score  4     Pain Location  Wrist    Pain Orientation  Right    Aggravating Factors    lifting heavy items    Pain Relieving Factors  medicine    Multiple Pain Sites  Yes    Pain Score  4    Pain Location  Knee    Pain Orientation  Left;Right         [facility] PT Assessment - 11/04/17 0001      Assessment   Medical Diagnosis  Rt carpal tunnel syndrome; Bilat patellofemoral syndrome    Referring Provider  Dr. Hulan Saas     Onset Date/Surgical Date  03/29/17    Hand Dominance  Right    Next MD Visit  12/18      Strength   Right Hand Grip (lbs)  42, 36    Left Hand Grip (lbs)  71, 65      Flexibility   Hamstrings  Rt 60 deg; Lt 80 deg         [facility] Adult PT Treatment/Exercise - 11/04/17 0001      Knee/Hip Exercises: Stretches   Passive Hamstring Stretch  Right;Left;2 reps;30 seconds    Quad Stretch  Right;Left;2 reps;30 seconds standing    Gastroc Stretch  Right;Left;2 reps    Other Knee/Hip  Stretches  Adductor/ ITB stretch with strap x 20 sec 2 reps each leg.       Knee/Hip Exercises: Aerobic   Nustep  L5: arms/legs x 6 min  PTA present to discuss progress      Wrist Exercises   Wrist Radial Deviation  Strengthening;Right;20 reps;Seated;Theraband    Theraband Level (Radial Deviation)  Level 1 (Yellow)    Other wrist exercises  wrist flexion and extension stretches 20 sec x 3 each     Other wrist exercises  Rt wrist flexion/ ext strengthening with yellow band x 20 reps       Modalities   Modalities  Iontophoresis;Electrical Stimulation;Moist Heat      Moist Heat Therapy   Number Minutes Moist Heat  15 Minutes    Moist Heat Location  Wrist;Knee Rt forearm, bilat knees      Electrical Stimulation   Electrical Stimulation Location  Rt dorsal and volar wrist and forearm    Electrical Stimulation Action  IFC    Electrical Stimulation Parameters   to tolerance     Electrical Stimulation Goals  Pain      Iontophoresis   Type of Iontophoresis  Dexamethasone    Location  Rt wrist at distal radius     Dose  1.0 cc     Time  80 mA patch (6 hr wear time)       reviewed self care of heat/ice application, along with self massage to bilat quads.        PT Education - 11/04/17 1450    Education provided  Yes    Education Details  ionto info    Person(s) Educated  Patient    Methods  Explanation;Handout    Comprehension  Verbalized understanding          PT Long Term Goals - 11/01/17 1126      PT LONG TERM GOAL #1   Title  Improve posture and alignment with patient to engage posterior shoulder girdle musculature 12/06/17    Time  6    Period  Weeks    Status  On-going      PT LONG TERM GOAL #2   Title  Increase ROM Rt UE shoulder and wrist to WFL's and pain free allowing improved UE function 12/06/17    Time  6    Period  Weeks    Status  On-going      PT LONG TERM GOAL #3   Title  Decrease pain in Rt wrist by 50-75% allowing patient to increase functional activity level 12/06/17 pt reports 10% improvement.     Period  Weeks    Status  On-going      PT LONG TERM GOAL #4   Title  Increase quad extensibility allowing patient to decrease knee pain with funcitonal activities including stiars     Time  6    Period  Weeks    Status  Partially Met able to get heel to buttocks in standing quad stretch      PT LONG TERM GOAL #5   Title  Decrease bilat knee pain by 50-75% allowing patient to ascend and descend 8-10 steps without difficutly 12/06/17    Time  6    Period  Weeks      PT LONG TERM GOAL #6   Title  Independent in HEP 12/06/17      PT LONG TERM GOAL #7   Title  Improve FOTO to </= 40% limitation 12/06/17    Time  6    Period  Weeks    Status  On-going            Plan - 11/04/17 1433    Clinical Impression Statement  Pt's Lt hamstring more flexible than last session.  Grip strength has also improved in Rt hand from eval.  Pt tolerated all exercises well, without increase in pain - some minor cues required for form.  Progressing towards goals.     Rehab Potential  Good    PT Frequency  2x / week    PT Duration  6  weeks    PT Treatment/Interventions  Patient/family education;ADLs/Self Care Home Management;Cryotherapy;Iontophoresis 15m/ml Dexamethasone;Moist Heat;Ultrasound;Dry needling;Manual techniques;Neuromuscular re-education;Therapeutic activities;Therapeutic exercise;Taping    PT Next Visit Plan  Assess response to ionto.  Cont Rt wrist/elbow and bilat knee strengthening/stretching.     Consulted and Agree with Plan of Care  Patient       Patient will benefit from skilled therapeutic intervention in order to improve the following deficits and impairments:  Impaired UE functional use, Postural dysfunction, Improper body mechanics, Increased fascial restricitons, Increased muscle spasms, Decreased range of motion, Decreased strength, Decreased mobility, Decreased activity tolerance  Visit Diagnosis: Pain in right wrist  Chronic pain of left knee  Chronic pain of right knee  Other symptoms and signs involving the musculoskeletal system     Problem List Patient Active Problem List   Diagnosis Date Noted  . Patella-femoral syndrome 10/11/2017  . RTI (respiratory tract infection) 07/19/2017  . Right hand weakness 03/07/2017  . Extensor intersection syndrome of right wrist 12/23/2016  . Hamstring tightness of right lower extremity 12/23/2016  . Carpal tunnel syndrome, right 06/07/2016  . Thumb pain 05/17/2016  . Paresthesia 01/20/2016  . CMC (carpometacarpal) synovitis 01/05/2016  . Medial meniscus tear 10/15/2014  . Left knee pain 09/24/2014  . Neck muscle strain 03/11/2014  . Elevated PSA 06/11/2013  . Hx of balanitis 06/11/2013  . Knee pain, bilateral 02/09/2013  . Cough 02/09/2013  . Dental infection 06/15/2012  . Dysuria 12/31/2011  . Bladder neck obstruction 10/25/2011  . Rash 07/16/2011  . Urethritis 06/01/2011  . Headache(784.0) 04/05/2011  . Abdominal pain 04/05/2011  . NECK PAIN 10/19/2010  . DYSPLASTIC NEVUS, CHEST 03/23/2010  . Eczema 03/23/2010  . HYDROCELE, RIGHT  03/03/2010  . Nocturia 03/03/2010  . Personal history of colonic polyps 03/03/2010  . RECTAL BLEEDING 01/27/2010  . Unspecified disorder of male genital organs 01/27/2010  . NONSPECIFIC ABN FINDING RAD & OTH EXAM GU ORGAN 01/27/2010  . HEMATOCHEZIA 01/23/2010  . BENIGN PROSTATIC HYPERTROPHY 01/23/2010  . HAND PAIN 01/01/2010  . B12 deficiency 06/03/2009  . WRIST PAIN 06/03/2009  . Chest pain on respiration 04/25/2009  . SHOULDER STRAIN, RIGHT 04/25/2009  . FUNGAL DERMATITIS 04/12/2009  . LOW BACK PAIN 07/29/2008  . GERD 04/30/2008  . WEIGHT LOSS, ABNORMAL 04/30/2008  . HEMORRHOIDS 04/23/2008  . SEBACEOUS CYST, INFECTED 04/23/2008  . TENDINITIS, RIGHT ELBOW 10/21/2007   JKerin Perna PTA 11/04/17 2:55 PM  [address] [address]Phone: [phone]  Fax:  [phone] Name: Jorge KAGAWAMRN: [id number] of Birth: 318-Mar-1957

## 2017-11-04 NOTE — Patient Instructions (Signed)

## 2017-11-08 ENCOUNTER — Encounter: Payer: Federal, State, Local not specified - PPO | Admitting: Physical Therapy

## 2017-11-11 ENCOUNTER — Ambulatory Visit: Payer: Federal, State, Local not specified - PPO | Admitting: Physical Therapy

## 2017-11-11 ENCOUNTER — Encounter: Payer: Self-pay | Admitting: Physical Therapy

## 2017-11-11 DIAGNOSIS — R293 Abnormal posture: Secondary | ICD-10-CM

## 2017-11-11 DIAGNOSIS — M25561 Pain in right knee: Secondary | ICD-10-CM | POA: Diagnosis not present

## 2017-11-11 DIAGNOSIS — M25562 Pain in left knee: Secondary | ICD-10-CM | POA: Diagnosis not present

## 2017-11-11 DIAGNOSIS — R29898 Other symptoms and signs involving the musculoskeletal system: Secondary | ICD-10-CM | POA: Diagnosis not present

## 2017-11-11 DIAGNOSIS — G8929 Other chronic pain: Secondary | ICD-10-CM

## 2017-11-11 DIAGNOSIS — M25531 Pain in right wrist: Secondary | ICD-10-CM

## 2017-11-11 NOTE — Therapy (Signed)
[place], [place], [address] Phone: [phone]   Fax:  [phone]  Physical Therapy Treatment  Patient Details  Name: Jorge Turner MRN: [id number] Date of Birth: 1956/08/27 Referring Provider: Dr. Hulan Saas   Encounter Date: 11/11/2017  PT End of Session - 11/11/17 1453    Visit Number  5    Number of Visits  12    Date for PT Re-Evaluation  12/06/17    PT Start Time  1452 pt arrived late    PT Stop Time  1540    PT Time Calculation (min)  48 min    Activity Tolerance  Patient tolerated treatment well    Behavior During Therapy  [facility] for tasks assessed/performed       Past Medical History:  Diagnosis Date  . Balanitis   . BPH (benign prostatic hypertrophy)   . DJD (degenerative joint disease) of knee   . Eczema   . GERD (gastroesophageal reflux disease)   . Hemorrhoid   . Low back pain   . Personal history of colonic polyps 03/03/2010  . Vitamin B12 deficiency     Past Surgical History:  Procedure Laterality Date  . CIRCUMCISION N/A 07/20/2013   Procedure: CIRCUMCISION ADULT;  Surgeon: Claybon Jabs, MD;  Location: [facility];  Service: Urology;  Laterality: N/A;  . COLONOSCOPY    . CYSTO REMOVAL RIGHT THUMB  2006  . RIGHT HYDROCELECTOMY  11-13-2010    There were no vitals filed for this visit.  Subjective Assessment - 11/11/17 1453    Subjective  Pt reports he didn't go to work 2 days this week due to back pain.  He had to shovel a lot on Sunday. He feels his knees and wrist aren't hurting as much since he hasn't worked.     Patient Stated Goals  patient wants ot get wrist and knees stronger and less painful so he can use wrist and knees for normal function - avoid surgery.  Return to bowling     Currently in Pain?  Yes    Pain Location  Wrist    Pain Orientation  Right    Pain Descriptors / Indicators  Nagging;Sore    Aggravating Factors   lifting heavy items     Pain Relieving Factors  medicine    Pain Score  4    Pain Location  Knee    Pain Orientation  Right;Left    Aggravating Factors   stairs     Pain Relieving Factors  medication          [facility] PT Assessment - 11/11/17 0001      Assessment   Medical Diagnosis  Rt carpal tunnel syndrome; Bilat patellofemoral syndrome    Referring Provider  Dr. Hulan Saas    Onset Date/Surgical Date  03/29/17    Hand Dominance  Right    Next MD Visit  12/18      Strength   Right Hand Grip (lbs)  51, 54    Left Hand Grip (lbs)  65, 64           [facility] Adult PT Treatment/Exercise - 11/11/17 0001      Knee/Hip Exercises: Stretches   Quad Stretch  Right;Left;2 reps;30 seconds standing    Gastroc Stretch  Right;Left;2 reps      Knee/Hip Exercises: Aerobic   Nustep  L5: arms/legs x 6 min  PTA present to discuss progress  Knee/Hip Exercises: Standing   SLS  SLS forward leans x 8 reps (slow, hand touching chair seat)      Knee/Hip Exercises: Seated   Sit to Sand  1 set;10 reps;without UE support slow eccentric control to low mat table      Shoulder Exercises: Stretch   Other Shoulder Stretches  mid-level doorway stretch (bilat) x 30 sec x 2 reps      Wrist Exercises   Wrist Radial Deviation  Strengthening;Right;10 reps;Seated    Theraband Level (Radial Deviation)  Level 2 (Red)    Other wrist exercises  wrist flexion and extension stretches 20 sec x 3 each     Other wrist exercises  Rt wrist flexion/ ext strengthening with yellow band x 20 reps       Moist Heat Therapy   Number Minutes Moist Heat  15 Minutes    Moist Heat Location  Wrist;Knee;Lumbar Spine Rt forearm, bilat knees      Electrical Stimulation   Electrical Stimulation Location  Rt dorsal and volar wrist and forearm    Electrical Stimulation Action  IFC    Electrical Stimulation Parameters  to tolerance     Electrical Stimulation Goals  Pain      Iontophoresis   Type of Iontophoresis  Dexamethasone    Location   Rt wrist at distal radius     Dose  1.0 cc     Time  80 mA patch (6 hr wear time)                  PT Long Term Goals - 11/11/17 1457      PT LONG TERM GOAL #1   Title  Improve posture and alignment with patient to engage posterior shoulder girdle musculature 12/06/17    Time  6    Period  Weeks    Status  On-going      PT LONG TERM GOAL #2   Title  Increase ROM Rt UE shoulder and wrist to WFL's and pain free allowing improved UE function 12/06/17    Time  6    Period  Weeks    Status  On-going      PT LONG TERM GOAL #3   Title  Decrease pain in Rt wrist by 50-75% allowing patient to increase functional activity level 12/06/17    Time  6    Period  Weeks    Status  On-going      PT LONG TERM GOAL #4   Title  Increase quad extensibility allowing patient to decrease knee pain with funcitonal activities including stiars     Time  6    Period  Weeks    Status  On-going            Plan - 11/11/17 1516    Clinical Impression Statement  Pt demonstrated improved grip strength.  He tolerated increased resistance with Rt wrist exercises with min increase in pain.  Pt making gradual gains towards therapy goals.     PT Frequency  2x / week    PT Duration  6 weeks    PT Treatment/Interventions  Patient/family education;ADLs/Self Care Home Management;Cryotherapy;Iontophoresis 4mg /ml Dexamethasone;Moist Heat;Ultrasound;Dry needling;Manual techniques;Neuromuscular re-education;Therapeutic activities;Therapeutic exercise;Taping    PT Next Visit Plan  Cont Rt wrist/elbow and bilat knee strengthening/stretching.     Consulted and Agree with Plan of Care  Patient       Patient will benefit from skilled therapeutic intervention in order to improve the following deficits and impairments:  Impaired UE functional use, Postural dysfunction, Improper body mechanics, Increased fascial restricitons, Increased muscle spasms, Decreased range of motion, Decreased strength, Decreased mobility,  Decreased activity tolerance  Visit Diagnosis: Pain in right wrist  Chronic pain of left knee  Chronic pain of right knee  Other symptoms and signs involving the musculoskeletal system  Abnormal posture     Problem List Patient Active Problem List   Diagnosis Date Noted  . Patella-femoral syndrome 10/11/2017  . RTI (respiratory tract infection) 07/19/2017  . Right hand weakness 03/07/2017  . Extensor intersection syndrome of right wrist 12/23/2016  . Hamstring tightness of right lower extremity 12/23/2016  . Carpal tunnel syndrome, right 06/07/2016  . Thumb pain 05/17/2016  . Paresthesia 01/20/2016  . CMC (carpometacarpal) synovitis 01/05/2016  . Medial meniscus tear 10/15/2014  . Left knee pain 09/24/2014  . Neck muscle strain 03/11/2014  . Elevated PSA 06/11/2013  . Hx of balanitis 06/11/2013  . Knee pain, bilateral 02/09/2013  . Cough 02/09/2013  . Dental infection 06/15/2012  . Dysuria 12/31/2011  . Bladder neck obstruction 10/25/2011  . Rash 07/16/2011  . Urethritis 06/01/2011  . Headache(784.0) 04/05/2011  . Abdominal pain 04/05/2011  . NECK PAIN 10/19/2010  . DYSPLASTIC NEVUS, CHEST 03/23/2010  . Eczema 03/23/2010  . HYDROCELE, RIGHT 03/03/2010  . Nocturia 03/03/2010  . Personal history of colonic polyps 03/03/2010  . RECTAL BLEEDING 01/27/2010  . Unspecified disorder of male genital organs 01/27/2010  . NONSPECIFIC ABN FINDING RAD & OTH EXAM GU ORGAN 01/27/2010  . HEMATOCHEZIA 01/23/2010  . BENIGN PROSTATIC HYPERTROPHY 01/23/2010  . HAND PAIN 01/01/2010  . B12 deficiency 06/03/2009  . WRIST PAIN 06/03/2009  . Chest pain on respiration 04/25/2009  . SHOULDER STRAIN, RIGHT 04/25/2009  . FUNGAL DERMATITIS 04/12/2009  . LOW BACK PAIN 07/29/2008  . GERD 04/30/2008  . WEIGHT LOSS, ABNORMAL 04/30/2008  . HEMORRHOIDS 04/23/2008  . SEBACEOUS CYST, INFECTED 04/23/2008  . TENDINITIS, RIGHT ELBOW 10/21/2007   Kerin Perna, PTA 11/11/17 4:51  PM  [place], [place], [address] Phone: [phone]   Fax:  [phone]  Name: Jorge Turner MRN: [id number] Date of Birth: Nov 22, 1956

## 2017-11-13 ENCOUNTER — Other Ambulatory Visit: Payer: Self-pay | Admitting: Internal Medicine

## 2017-11-15 ENCOUNTER — Ambulatory Visit: Payer: Federal, State, Local not specified - PPO | Admitting: Physical Therapy

## 2017-11-15 ENCOUNTER — Encounter: Payer: Self-pay | Admitting: Physical Therapy

## 2017-11-15 DIAGNOSIS — G8929 Other chronic pain: Secondary | ICD-10-CM | POA: Diagnosis not present

## 2017-11-15 DIAGNOSIS — R293 Abnormal posture: Secondary | ICD-10-CM

## 2017-11-15 DIAGNOSIS — M25531 Pain in right wrist: Secondary | ICD-10-CM | POA: Diagnosis not present

## 2017-11-15 DIAGNOSIS — M25561 Pain in right knee: Secondary | ICD-10-CM

## 2017-11-15 DIAGNOSIS — M25562 Pain in left knee: Secondary | ICD-10-CM

## 2017-11-15 DIAGNOSIS — R29898 Other symptoms and signs involving the musculoskeletal system: Secondary | ICD-10-CM | POA: Diagnosis not present

## 2017-11-15 NOTE — Therapy (Signed)
[facility] [address] [place], [place], [address] Phone: [phone]   Fax:  [phone]  Physical Therapy Treatment  Patient Details  Name: Jorge Turner MRN: [id number] Date of Birth: 06/23/1956 Referring Provider: Dr. Zachary Smith   Encounter Date: 11/15/2017  PT End of Session - 11/15/17 1003    Visit Number  6    Number of Visits  12    Date for PT Re-Evaluation  12/06/17    PT Start Time  1003    PT Stop Time  1108    PT Time Calculation (min)  65 min    Activity Tolerance  Patient tolerated treatment well    Behavior During Therapy  WFL for tasks assessed/performed       Past Medical History:  Diagnosis Date  . Balanitis   . BPH (benign prostatic hypertrophy)   . DJD (degenerative joint disease) of knee   . Eczema   . GERD (gastroesophageal reflux disease)   . Hemorrhoid   . Low back pain   . Personal history of colonic polyps 03/03/2010  . Vitamin B12 deficiency     Past Surgical History:  Procedure Laterality Date  . CIRCUMCISION N/A 07/20/2013   Procedure: CIRCUMCISION ADULT;  Surgeon: Mark C Ottelin, MD;  Location: [facility];  Service: Urology;  Laterality: N/A;  . COLONOSCOPY    . CYSTO REMOVAL RIGHT THUMB  2006  . RIGHT HYDROCELECTOMY  11-13-2010    There were no vitals filed for this visit.  Subjective Assessment - 11/15/17 1004    Subjective  PT reports that he has felt pretty good the last couple of days except for having a stiffneck  - possibly because he hasn't been to work for 5 days.      Currently in Pain?  Yes    Pain Score  3     Pain Location  Wrist    Pain Orientation  Right    Pain Descriptors / Indicators  Nagging    Pain Type  Chronic pain    Pain Onset  More than a month ago    Pain Frequency  Intermittent    Aggravating Factors   hasn't been pushing himself so not sure right now    Pain Relieving Factors  rest    Multiple Pain Sites  Yes    Pain Score   3    Pain Location  Knee    Pain Orientation  Left;Right    Pain Descriptors / Indicators  Nagging    Pain Type  Chronic pain    Pain Frequency  Constant    Aggravating Factors   stairs and lifting    Pain Relieving Factors  rest                      [facility] Adult PT Treatment/Exercise - 11/15/17 0001      Knee/Hip Exercises: Stretches   Active Hamstring Stretch  Both;2 reps 45 sec with strap      Knee/Hip Exercises: Aerobic   Nustep  L5: arms/legs x 6 min       Knee/Hip Exercises: Standing   Forward Step Up  Both;20 reps 10" step, audible crepitis in Rt knee    SLS  sit to/from stand each side to low mat 2x10      Knee/Hip Exercises: Supine   Bridges  Strengthening;Both;2 sets;10 reps figure 4's with UE horizontal abduct, red band      Knee/Hip Exercises:   Prone   Other Prone Exercises  10x5sec bear position holds, qaudruped      Wrist Exercises   Other wrist exercises  velcro board with long handled roller  Rt hand 2x5      Moist Heat Therapy   Number Minutes Moist Heat  15 Minutes    Moist Heat Location  Wrist;Knee;Lumbar Spine      Iontophoresis   Type of Iontophoresis  Dexamethasone    Location  Rt wrist at distal radius     Dose  1.0 cc     Time  80 mA patch (6 hr wear time)                  PT Long Term Goals - 11/15/17 1009      PT LONG TERM GOAL #1   Title  Improve posture and alignment with patient to engage posterior shoulder girdle musculature 12/06/17    Status  On-going      PT LONG TERM GOAL #2   Title  Increase ROM Rt UE shoulder and wrist to WFL's and pain free allowing improved UE function 12/06/17    Status  Partially Met met for shoulder, not for wrist      PT LONG TERM GOAL #3   Title  Decrease pain in Rt wrist by 50-75% allowing patient to increase functional activity level 12/06/17    Status  On-going as of today however he hasn't been to work in a couple days.       PT LONG TERM GOAL #4   Title  Increase quad  extensibility allowing patient to decrease knee pain with funcitonal activities including stiars     Status  Partially Met      PT LONG TERM GOAL #5   Title  Decrease bilat knee pain by 50-75% allowing patient to ascend and descend 8-10 steps without difficutly 12/06/17    Status  On-going hasn't been on stairs in almost a week      PT LONG TERM GOAL #6   Title  Independent in HEP 12/06/17    Status  On-going      PT LONG TERM GOAL #7   Title  Improve FOTO to </= 40% limitation 12/06/17    Status  On-going            Plan - 11/15/17 1054    Clinical Impression Statement  Zakary has partially met his goals.  He hasn't been to work in 5 days so it is hard to truly say there prolonged progress.  He returns to work today. He continues to have some pain in his Rt wrist with lifting and bilat knees with over use, repetitive bending.     Rehab Potential  Good    Clinical Impairments Affecting Rehab Potential  chronic nature of problems     PT Frequency  2x / week    PT Duration  6 weeks    PT Treatment/Interventions  Patient/family education;ADLs/Self Care Home Management;Cryotherapy;Iontophoresis 4mg/ml Dexamethasone;Moist Heat;Ultrasound;Dry needling;Manual techniques;Neuromuscular re-education;Therapeutic activities;Therapeutic exercise;Taping    PT Next Visit Plan  Cont Rt wrist/elbow and bilat knee strengthening/stretching.     Consulted and Agree with Plan of Care  Patient       Patient will benefit from skilled therapeutic intervention in order to improve the following deficits and impairments:  Impaired UE functional use, Postural dysfunction, Improper body mechanics, Increased fascial restricitons, Increased muscle spasms, Decreased range of motion, Decreased strength, Decreased mobility, Decreased activity tolerance  Visit Diagnosis: Pain   in right wrist  Chronic pain of left knee  Chronic pain of right knee  Other symptoms and signs involving the musculoskeletal  system  Abnormal posture     Problem List Patient Active Problem List   Diagnosis Date Noted  . Patella-femoral syndrome 10/11/2017  . RTI (respiratory tract infection) 07/19/2017  . Right hand weakness 03/07/2017  . Extensor intersection syndrome of right wrist 12/23/2016  . Hamstring tightness of right lower extremity 12/23/2016  . Carpal tunnel syndrome, right 06/07/2016  . Thumb pain 05/17/2016  . Paresthesia 01/20/2016  . CMC (carpometacarpal) synovitis 01/05/2016  . Medial meniscus tear 10/15/2014  . Left knee pain 09/24/2014  . Neck muscle strain 03/11/2014  . Elevated PSA 06/11/2013  . Hx of balanitis 06/11/2013  . Knee pain, bilateral 02/09/2013  . Cough 02/09/2013  . Dental infection 06/15/2012  . Dysuria 12/31/2011  . Bladder neck obstruction 10/25/2011  . Rash 07/16/2011  . Urethritis 06/01/2011  . Headache(784.0) 04/05/2011  . Abdominal pain 04/05/2011  . NECK PAIN 10/19/2010  . DYSPLASTIC NEVUS, CHEST 03/23/2010  . Eczema 03/23/2010  . HYDROCELE, RIGHT 03/03/2010  . Nocturia 03/03/2010  . Personal history of colonic polyps 03/03/2010  . RECTAL BLEEDING 01/27/2010  . Unspecified disorder of male genital organs 01/27/2010  . NONSPECIFIC ABN FINDING RAD & OTH EXAM GU ORGAN 01/27/2010  . HEMATOCHEZIA 01/23/2010  . BENIGN PROSTATIC HYPERTROPHY 01/23/2010  . HAND PAIN 01/01/2010  . B12 deficiency 06/03/2009  . WRIST PAIN 06/03/2009  . Chest pain on respiration 04/25/2009  . SHOULDER STRAIN, RIGHT 04/25/2009  . FUNGAL DERMATITIS 04/12/2009  . LOW BACK PAIN 07/29/2008  . GERD 04/30/2008  . WEIGHT LOSS, ABNORMAL 04/30/2008  . HEMORRHOIDS 04/23/2008  . SEBACEOUS CYST, INFECTED 04/23/2008  . TENDINITIS, RIGHT ELBOW 10/21/2007    Manuela Schwartz shaver PT  11/15/2017, 10:59 AM  [facility] [place], [place], [address] Phone: [phone]   Fax:  [phone]  Name: ANAS REISTER MRN:  [id number] Date of Birth: 21-Apr-1956

## 2017-11-16 ENCOUNTER — Encounter: Payer: Federal, State, Local not specified - PPO | Admitting: Physical Therapy

## 2017-11-17 ENCOUNTER — Ambulatory Visit: Payer: Federal, State, Local not specified - PPO | Admitting: Family Medicine

## 2017-11-18 ENCOUNTER — Ambulatory Visit (INDEPENDENT_AMBULATORY_CARE_PROVIDER_SITE_OTHER): Payer: Federal, State, Local not specified - PPO | Admitting: Physical Therapy

## 2017-11-18 ENCOUNTER — Encounter: Payer: Self-pay | Admitting: Physical Therapy

## 2017-11-18 DIAGNOSIS — M25562 Pain in left knee: Secondary | ICD-10-CM

## 2017-11-18 DIAGNOSIS — G8929 Other chronic pain: Secondary | ICD-10-CM

## 2017-11-18 DIAGNOSIS — R293 Abnormal posture: Secondary | ICD-10-CM

## 2017-11-18 DIAGNOSIS — M25561 Pain in right knee: Secondary | ICD-10-CM

## 2017-11-18 DIAGNOSIS — M25531 Pain in right wrist: Secondary | ICD-10-CM

## 2017-11-18 DIAGNOSIS — R29898 Other symptoms and signs involving the musculoskeletal system: Secondary | ICD-10-CM

## 2017-11-18 NOTE — Therapy (Signed)
[place], [place], [address] Phone: [phone]   Fax:  [phone]  Physical Therapy Treatment  Patient Details  Name: Jorge Turner MRN: [id number] Date of Birth: March 08, 1956 Referring Provider: Dr. Hulan Saas   Encounter Date: 11/18/2017  PT End of Session - 11/18/17 1408    Visit Number  7    Number of Visits  12    Date for PT Re-Evaluation  12/06/17    PT Start Time  0786    PT Stop Time  1501 MHP last 12 min    PT Time Calculation (min)  54 min       Past Medical History:  Diagnosis Date  . Balanitis   . BPH (benign prostatic hypertrophy)   . DJD (degenerative joint disease) of knee   . Eczema   . GERD (gastroesophageal reflux disease)   . Hemorrhoid   . Low back pain   . Personal history of colonic polyps 03/03/2010  . Vitamin B12 deficiency     Past Surgical History:  Procedure Laterality Date  . CIRCUMCISION N/A 07/20/2013   Procedure: CIRCUMCISION ADULT;  Surgeon: Claybon Jabs, MD;  Location: [facility];  Service: Urology;  Laterality: N/A;  . COLONOSCOPY    . CYSTO REMOVAL RIGHT THUMB  2006  . RIGHT HYDROCELECTOMY  11-13-2010    There were no vitals filed for this visit.  Subjective Assessment - 11/18/17 1408    Subjective  Pt reports he is still recovering from last therapy session.  He had trouble with stairs at work because his muscles were sore.  He thinks the ionto patches on Rt wrist have helped.  he went to bowling alley and bowled 1 game with 13# ball; no new pain in wrist "It went ok".     Patient Stated Goals  patient wants ot get wrist and knees stronger and less painful so he can use wrist and knees for normal function - avoid surgery.  Return to bowling     Currently in Pain?  Yes    Pain Score  3     Pain Location  Wrist    Pain Orientation  Right    Aggravating Factors   squeezing fist.     Pain Relieving Factors  rest    Pain Score  4     Pain Location  Knee    Pain Orientation  Right;Left    Aggravating Factors   stairs     Pain Relieving Factors  rest         [facility] PT Assessment - 11/18/17 0001      Assessment   Medical Diagnosis  Rt carpal tunnel syndrome; Bilat patellofemoral syndrome    Referring Provider  Dr. Hulan Saas    Onset Date/Surgical Date  03/29/17    Hand Dominance  Right    Next MD Visit  not scheduled       Strength   Right Hand Grip (lbs)  59, 66    Left Hand Grip (lbs)  84,90      Flexibility   Hamstrings  Lt 65 deg; Rt 72 deg         [facility] Adult PT Treatment/Exercise - 11/18/17 0001      Knee/Hip Exercises: Stretches   Passive Hamstring Stretch  Right;Left;2 reps;30 seconds    Quad Stretch  Right;Left;2 reps;30 seconds standing    Piriformis Stretch  Right;Left;1 rep;30 seconds    Gastroc Stretch  Right;Left;2  reps      Knee/Hip Exercises: Aerobic   Nustep  L5: arms/legs x 6 min  PTA present to discuss progress      Knee/Hip Exercises: Standing   SLS  sit to/from stand each side to mat x10 reps, VC and demo for form.       Knee/Hip Exercises: Sidelying   Hip ABduction  Strengthening;Right;Left;2 sets;10 reps      Wrist Exercises   Other wrist exercises  velcro board with long handled roller  Rt hand wrist flex/ext (up and back- 2 reps)    Other wrist exercises  Rt wrist stretch into radial deviation x 20 sec x 2 reps; wrist ext x 30 x 2 reps, wrist flex x 30 sec x 2 reps      Moist Heat Therapy   Number Minutes Moist Heat  15 Minutes    Moist Heat Location  Knee bilat      Iontophoresis   Type of Iontophoresis  Dexamethasone    Location  Rt wrist at distal ulna    Dose  1.0 cc     Time  80 mA patch (6 hr wear time)                  PT Long Term Goals - 11/15/17 1009      PT LONG TERM GOAL #1   Title  Improve posture and alignment with patient to engage posterior shoulder girdle musculature 12/06/17    Status  On-going      PT LONG TERM GOAL #2    Title  Increase ROM Rt UE shoulder and wrist to WFL's and pain free allowing improved UE function 12/06/17    Status  Partially Met met for shoulder, not for wrist      PT LONG TERM GOAL #3   Title  Decrease pain in Rt wrist by 50-75% allowing patient to increase functional activity level 12/06/17    Status  On-going as of today however he hasn't been to work in a couple days.       PT LONG TERM GOAL #4   Title  Increase quad extensibility allowing patient to decrease knee pain with funcitonal activities including stiars     Status  Partially Met      PT LONG TERM GOAL #5   Title  Decrease bilat knee pain by 50-75% allowing patient to ascend and descend 8-10 steps without difficutly 12/06/17    Status  On-going hasn't been on stairs in almost a week      PT LONG TERM GOAL #6   Title  Independent in HEP 12/06/17    Status  On-going      PT LONG TERM GOAL #7   Title  Improve FOTO to </= 40% limitation 12/06/17    Status  On-going            Plan - 11/18/17 1427    Clinical Impression Statement  Pt demonstrated improved grip strength, however Rt is still weaker than Lt.  (pt is dominant hand).  He had some difficulty with single leg sit to/from stand due to soreness from last session.  Pt reporting 60% overall improvement since initating therapy.  Progressing towards remaining goals.     Rehab Potential  Good    PT Frequency  2x / week    PT Duration  6 weeks    PT Treatment/Interventions  Patient/family education;ADLs/Self Care Home Management;Cryotherapy;Iontophoresis 52m/ml Dexamethasone;Moist Heat;Ultrasound;Dry needling;Manual techniques;Neuromuscular re-education;Therapeutic activities;Therapeutic exercise;Taping    PT  Next Visit Plan  Cont Rt wrist/elbow and bilat knee strengthening/stretching.     Consulted and Agree with Plan of Care  Patient       Patient will benefit from skilled therapeutic intervention in order to improve the following deficits and impairments:  Impaired UE  functional use, Postural dysfunction, Improper body mechanics, Increased fascial restricitons, Increased muscle spasms, Decreased range of motion, Decreased strength, Decreased mobility, Decreased activity tolerance  Visit Diagnosis: Pain in right wrist  Chronic pain of left knee  Chronic pain of right knee  Other symptoms and signs involving the musculoskeletal system  Abnormal posture     Problem List Patient Active Problem List   Diagnosis Date Noted  . Patella-femoral syndrome 10/11/2017  . RTI (respiratory tract infection) 07/19/2017  . Right hand weakness 03/07/2017  . Extensor intersection syndrome of right wrist 12/23/2016  . Hamstring tightness of right lower extremity 12/23/2016  . Carpal tunnel syndrome, right 06/07/2016  . Thumb pain 05/17/2016  . Paresthesia 01/20/2016  . CMC (carpometacarpal) synovitis 01/05/2016  . Medial meniscus tear 10/15/2014  . Left knee pain 09/24/2014  . Neck muscle strain 03/11/2014  . Elevated PSA 06/11/2013  . Hx of balanitis 06/11/2013  . Knee pain, bilateral 02/09/2013  . Cough 02/09/2013  . Dental infection 06/15/2012  . Dysuria 12/31/2011  . Bladder neck obstruction 10/25/2011  . Rash 07/16/2011  . Urethritis 06/01/2011  . Headache(784.0) 04/05/2011  . Abdominal pain 04/05/2011  . NECK PAIN 10/19/2010  . DYSPLASTIC NEVUS, CHEST 03/23/2010  . Eczema 03/23/2010  . HYDROCELE, RIGHT 03/03/2010  . Nocturia 03/03/2010  . Personal history of colonic polyps 03/03/2010  . RECTAL BLEEDING 01/27/2010  . Unspecified disorder of male genital organs 01/27/2010  . NONSPECIFIC ABN FINDING RAD & OTH EXAM GU ORGAN 01/27/2010  . HEMATOCHEZIA 01/23/2010  . BENIGN PROSTATIC HYPERTROPHY 01/23/2010  . HAND PAIN 01/01/2010  . B12 deficiency 06/03/2009  . WRIST PAIN 06/03/2009  . Chest pain on respiration 04/25/2009  . SHOULDER STRAIN, RIGHT 04/25/2009  . FUNGAL DERMATITIS 04/12/2009  . LOW BACK PAIN 07/29/2008  . GERD 04/30/2008  .  WEIGHT LOSS, ABNORMAL 04/30/2008  . HEMORRHOIDS 04/23/2008  . SEBACEOUS CYST, INFECTED 04/23/2008  . TENDINITIS, RIGHT ELBOW 10/21/2007   Kerin Perna, PTA 11/18/17 4:03 PM  [place], [place], [address] Phone: [phone]   Fax:  [phone]  Name: Jorge Turner MRN: [id number] Date of Birth: [birth year]

## 2017-11-28 ENCOUNTER — Ambulatory Visit: Payer: Federal, State, Local not specified - PPO | Admitting: Physical Therapy

## 2017-11-28 ENCOUNTER — Encounter: Payer: Self-pay | Admitting: Physical Therapy

## 2017-11-28 DIAGNOSIS — G8929 Other chronic pain: Secondary | ICD-10-CM | POA: Diagnosis not present

## 2017-11-28 DIAGNOSIS — M25531 Pain in right wrist: Secondary | ICD-10-CM

## 2017-11-28 DIAGNOSIS — R293 Abnormal posture: Secondary | ICD-10-CM

## 2017-11-28 DIAGNOSIS — R29898 Other symptoms and signs involving the musculoskeletal system: Secondary | ICD-10-CM | POA: Diagnosis not present

## 2017-11-28 DIAGNOSIS — M25561 Pain in right knee: Secondary | ICD-10-CM

## 2017-11-28 DIAGNOSIS — M25562 Pain in left knee: Secondary | ICD-10-CM

## 2017-11-28 NOTE — Therapy (Addendum)
[place], [place], [address] Phone: [phone]   Fax:  [phone]  Physical Therapy Treatment  Patient Details  Name: Jorge Turner MRN: [id number] Date of Birth: [birth year] Referring Provider: Dr. Hulan Saas   Encounter Date: 11/28/2017  PT End of Session - 11/28/17 1404    Visit Number  8    Number of Visits  12    Date for PT Re-Evaluation  12/06/17    PT Start Time  8101    PT Stop Time  1500 MHP last 12 min     PT Time Calculation (min)  55 min    Activity Tolerance  Patient tolerated treatment well    Behavior During Therapy  [facility] for tasks assessed/performed       Past Medical History:  Diagnosis Date  . Balanitis   . BPH (benign prostatic hypertrophy)   . DJD (degenerative joint disease) of knee   . Eczema   . GERD (gastroesophageal reflux disease)   . Hemorrhoid   . Low back pain   . Personal history of colonic polyps 03/03/2010  . Vitamin B12 deficiency     Past Surgical History:  Procedure Laterality Date  . CIRCUMCISION N/A 07/20/2013   Procedure: CIRCUMCISION ADULT;  Surgeon: Claybon Jabs, MD;  Location: [facility];  Service: Urology;  Laterality: N/A;  . COLONOSCOPY    . CYSTO REMOVAL RIGHT THUMB  2006  . RIGHT HYDROCELECTOMY  11-13-2010    There were no vitals filed for this visit.  Subjective Assessment - 11/28/17 1405    Subjective  Pt bowled 1x since last visit; "I didn't see any significant new pain during or after".   Yesterday he reports his Lt knee was "an 8/10, like I got kneed in the thigh". He worked 6 full days last week, initially stairs were painful but got better. He feels like things are improving.      Currently in Pain?  Yes    Pain Score  2     Pain Location  Wrist    Pain Orientation  Right    Pain Descriptors / Indicators  Dull      bilat knee pain of 2/10;  Stairs make it worse/ heat makes it feel better.    [facility] PT  Assessment - 11/28/17 0001      Assessment   Medical Diagnosis  Rt carpal tunnel syndrome; Bilat patellofemoral syndrome      AROM   Right Wrist Extension  72 Degrees    Right Wrist Flexion  57 Degrees      Strength   Right Hand Grip (lbs)  59, 65    Left Hand Grip (lbs)  90, 80         [facility] Adult PT Treatment/Exercise - 11/28/17 0001      Knee/Hip Exercises: Stretches   Passive Hamstring Stretch  Right;Left;2 reps;30 seconds    Quad Stretch  Right;Left;2 reps;30 seconds standing    Piriformis Stretch  Right;Left;1 rep;30 seconds    Gastroc Stretch  Right;Left;2 reps      Knee/Hip Exercises: Aerobic   Nustep  L6: arms/legs x 7.5 min  PTA present to discuss progress      Knee/Hip Exercises: Standing   SLS  sit to/from stand each side to elevated mat x10 reps, VC and demo for form.   Forward leans to chair seat x 10 reps each leg.       Wrist Exercises  Wrist Radial Deviation  Strengthening;Right;10 reps;Seated;Bar weights/barbell    Bar Weights/Barbell (Radial Deviation)  3 lbs    Other wrist exercises  velcro board with long handled roller  Rt hand wrist flex/ext (up and back- 2 reps);  3# Rt forearm pronation/supination x 10 reps each direction    Other wrist exercises  Rt wrist stretch into radial deviation x 20 sec x 2 reps; wrist ext x 30 x 2 reps, wrist flex x 30 sec x 2 reps      Moist Heat Therapy   Number Minutes Moist Heat  12 Minutes    Moist Heat Location  Lumbar Spine;Knee bilat knee      Iontophoresis   Type of Iontophoresis  Dexamethasone    Location  Rt wrist at distal ulna    Dose  1.0 cc     Time  80 mA patch (6 hr wear time)                  PT Long Term Goals - 11/28/17 1412      PT LONG TERM GOAL #1   Title  Improve posture and alignment with patient to engage posterior shoulder girdle musculature 12/06/17    Time  6    Period  Weeks    Status  On-going      PT LONG TERM GOAL #2   Title  Increase ROM Rt UE shoulder and wrist to  WFL's and pain free allowing improved UE function 12/06/17    Time  6    Period  Weeks    Status  Partially Met      PT LONG TERM GOAL #3   Title  Decrease pain in Rt wrist by 50-75% allowing patient to increase functional activity level 12/06/17    Time  6    Period  Weeks    Status  Partially Met 50% reduction in pain in wrist reported 11/28/17.      PT LONG TERM GOAL #4   Title  Increase quad extensibility allowing patient to decrease knee pain with funcitonal activities including stairs     Time  6    Period  Weeks    Status  Partially Met quad flexibility improved; pain with stairs.       PT LONG TERM GOAL #5   Title  Decrease bilat knee pain by 50-75% allowing patient to ascend and descend 8-10 steps without difficutly 12/06/17    Time  6    Period  Weeks    Status  On-going 40% improvement in knee pain, reported 11/28/17      PT LONG TERM GOAL #6   Title  Independent in HEP 12/06/17    Time  6    Period  Weeks    Status  On-going      PT LONG TERM GOAL #7   Title  Improve FOTO to </= 40% limitation 12/06/17    Time  6    Period  Weeks    Status  On-going            Plan - 11/28/17 1416    Clinical Impression Statement  Overall improvement in pain reduction in wrist and bilat knees.  Rt wrist ROM has improved; grip strength similar to last reading.  He tolerated increased resistance with wrist exercises today. Pt has partially met his goals and is progressing well towards remaining goals.     Rehab Potential  Good    PT Frequency  2x / week  PT Duration  6 weeks    PT Treatment/Interventions  Patient/family education;ADLs/Self Care Home Management;Cryotherapy;Iontophoresis 48m/ml Dexamethasone;Moist Heat;Ultrasound;Dry needling;Manual techniques;Neuromuscular re-education;Therapeutic activities;Therapeutic exercise;Taping    PT Next Visit Plan  Cont Rt wrist/elbow and bilat knee strengthening/stretching.   POC through 12/06/17    Consulted and Agree with Plan of Care   Patient       Patient will benefit from skilled therapeutic intervention in order to improve the following deficits and impairments:  Impaired UE functional use, Postural dysfunction, Improper body mechanics, Increased fascial restricitons, Increased muscle spasms, Decreased range of motion, Decreased strength, Decreased mobility, Decreased activity tolerance  Visit Diagnosis: Pain in right wrist  Chronic pain of left knee  Chronic pain of right knee  Other symptoms and signs involving the musculoskeletal system  Abnormal posture     Problem List Patient Active Problem List   Diagnosis Date Noted  . Patella-femoral syndrome 10/11/2017  . RTI (respiratory tract infection) 07/19/2017  . Right hand weakness 03/07/2017  . Extensor intersection syndrome of right wrist 12/23/2016  . Hamstring tightness of right lower extremity 12/23/2016  . Carpal tunnel syndrome, right 06/07/2016  . Thumb pain 05/17/2016  . Paresthesia 01/20/2016  . CMC (carpometacarpal) synovitis 01/05/2016  . Medial meniscus tear 10/15/2014  . Left knee pain 09/24/2014  . Neck muscle strain 03/11/2014  . Elevated PSA 06/11/2013  . Hx of balanitis 06/11/2013  . Knee pain, bilateral 02/09/2013  . Cough 02/09/2013  . Dental infection 06/15/2012  . Dysuria 12/31/2011  . Bladder neck obstruction 10/25/2011  . Rash 07/16/2011  . Urethritis 06/01/2011  . Headache(784.0) 04/05/2011  . Abdominal pain 04/05/2011  . NECK PAIN 10/19/2010  . DYSPLASTIC NEVUS, CHEST 03/23/2010  . Eczema 03/23/2010  . HYDROCELE, RIGHT 03/03/2010  . Nocturia 03/03/2010  . Personal history of colonic polyps 03/03/2010  . RECTAL BLEEDING 01/27/2010  . Unspecified disorder of male genital organs 01/27/2010  . NONSPECIFIC ABN FINDING RAD & OTH EXAM GU ORGAN 01/27/2010  . HEMATOCHEZIA 01/23/2010  . BENIGN PROSTATIC HYPERTROPHY 01/23/2010  . HAND PAIN 01/01/2010  . B12 deficiency 06/03/2009  . WRIST PAIN 06/03/2009  . Chest pain on  respiration 04/25/2009  . SHOULDER STRAIN, RIGHT 04/25/2009  . FUNGAL DERMATITIS 04/12/2009  . LOW BACK PAIN 07/29/2008  . GERD 04/30/2008  . WEIGHT LOSS, ABNORMAL 04/30/2008  . HEMORRHOIDS 04/23/2008  . SEBACEOUS CYST, INFECTED 04/23/2008  . TENDINITIS, RIGHT ELBOW 10/21/2007   JKerin Perna PTA 11/28/17 2:54 PM  [address] [place] [address]Phone: [phone]  Fax:  [phone] Name: Jorge LIPKINMRN: [id number] of Birth: 314-Sep-1957 PHYSICAL THERAPY DISCHARGE SUMMARY  Visits from Start of Care: 8  Current functional level related to goals / functional outcomes: See last progress note for discharge status    Remaining deficits: Unknown    Education / Equipment: HEP  Plan: Patient agrees to discharge.  Patient goals were partially met. Patient is being discharged due to being pleased with the current functional level.  ?????    Celyn P. HHelene KelpPT, MPH 01/05/18 5:48 PM

## 2017-12-02 ENCOUNTER — Encounter: Payer: Federal, State, Local not specified - PPO | Admitting: Physical Therapy

## 2017-12-29 DIAGNOSIS — H43811 Vitreous degeneration, right eye: Secondary | ICD-10-CM | POA: Diagnosis not present

## 2017-12-29 DIAGNOSIS — H524 Presbyopia: Secondary | ICD-10-CM | POA: Diagnosis not present

## 2018-01-02 ENCOUNTER — Encounter: Payer: Self-pay | Admitting: Internal Medicine

## 2018-01-02 ENCOUNTER — Ambulatory Visit: Payer: Federal, State, Local not specified - PPO | Admitting: Internal Medicine

## 2018-01-02 DIAGNOSIS — M545 Low back pain, unspecified: Secondary | ICD-10-CM

## 2018-01-02 DIAGNOSIS — K219 Gastro-esophageal reflux disease without esophagitis: Secondary | ICD-10-CM

## 2018-01-02 DIAGNOSIS — E538 Deficiency of other specified B group vitamins: Secondary | ICD-10-CM | POA: Diagnosis not present

## 2018-01-02 DIAGNOSIS — G8929 Other chronic pain: Secondary | ICD-10-CM

## 2018-01-02 DIAGNOSIS — R972 Elevated prostate specific antigen [PSA]: Secondary | ICD-10-CM | POA: Diagnosis not present

## 2018-01-02 MED ORDER — TAMSULOSIN HCL 0.4 MG PO CAPS: 0.4000 mg | ORAL_CAPSULE | Freq: Every day | ORAL | 3 refills | Status: DC

## 2018-01-02 NOTE — Assessment & Plan Note (Signed)
Omeprazole

## 2018-01-02 NOTE — Assessment & Plan Note (Signed)
Vit B12 

## 2018-01-02 NOTE — Assessment & Plan Note (Addendum)
Worse Start PT Meloxicam prn

## 2018-01-02 NOTE — Assessment & Plan Note (Signed)
Proscar 

## 2018-01-02 NOTE — Progress Notes (Signed)
Subjective:  Patient ID: Jorge Turner, male    DOB: 22-Dec-1955  Age: 62 y.o. MRN: [id number]  CC: No chief complaint on file.   HPI Jorge Turner presents for abd pain, B12 def, arthralgias f/u. C/o urinary sx's. C/o LBP flaring up  Outpatient Medications Prior to Visit  Medication Sig Dispense Refill  . ANUSOL-HC 2.5 % rectal cream APPLY TO AFFECTED AREA TWICE A DAY AS NEEDED 30 g 0  . BREO ELLIPTA 100-25 MCG/INH AEPB INHALE 1 PUFF INTO THE LUNGS EVERY DAY 60 each 11  . cholecalciferol (VITAMIN D) 1000 UNITS tablet Take 1,000 Units by mouth daily.      . Cyanocobalamin (VITAMIN B-12) 1000 MCG SUBL Place 1 tablet (1,000 mcg total) under the tongue daily. 100 tablet 3  . Diclofenac Sodium 2 % SOLN Apply 1 pump twice daily. 112 g 3  . doxycycline (VIBRA-TABS) 100 MG tablet Take 1 tablet (100 mg total) by mouth 2 (two) times daily. For 7 days. 14 tablet 0  . finasteride (PROSCAR) 5 MG tablet TAKE 1 TABLET EVERY DAY 90 tablet 3  . gabapentin (NEURONTIN) 100 MG capsule Take 1 capsule (100 mg total) by mouth 3 (three) times daily as needed. 270 capsule 1  . hydrOXYzine (ATARAX/VISTARIL) 25 MG tablet TAKE 1-2 TABLETS (25-50 MG TOTAL) BY MOUTH AT BEDTIME AS NEEDED FOR ITCHING. 180 tablet 0  . hyoscyamine (LEVSIN SL) 0.125 MG SL tablet Place 1 tablet (0.125 mg total) under the tongue every 4 (four) hours as needed. 30 tablet 2  . ibuprofen (ADVIL,MOTRIN) 600 MG tablet TAKE 1 TABLET BY MOUTH TWICE A DAY 180 tablet 1  . ketoconazole (NIZORAL) 2 % cream Apply topically as needed.    . meloxicam (MOBIC) 15 MG tablet Take 1 tablet (15 mg total) by mouth daily. 30 tablet 3  . metroNIDAZOLE (FLAGYL) 500 MG tablet Take 1 tablet (500 mg total) by mouth 3 (three) times daily. 30 tablet 0  . mupirocin ointment (BACTROBAN) 2 % Applied twice a day to the affected area;NOT into eyes. 15 g 0  . omeprazole (PRILOSEC) 40 MG capsule Take 1 capsule (40 mg total) by mouth daily. 90 capsule 3  . Probiotic Product  (ALIGN) 4 MG CAPS Take 1 capsule (4 mg total) by mouth daily. 30 capsule 0  . traMADol (ULTRAM) 50 MG tablet Take 1-2 tablets (50-100 mg total) by mouth every 6 (six) hours as needed. 60 tablet 3  . triamcinolone ointment (KENALOG) 0.5 % Apply 1 application topically 2 (two) times daily. 45 g 3  . valACYclovir (VALTREX) 1000 MG tablet Take 2t po q12h times 2 doses. Repeat with next fever blister episode. 20 tablet 3   No facility-administered medications prior to visit.     ROS Review of Systems  Constitutional: Negative for appetite change, fatigue and unexpected weight change.  HENT: Negative for congestion, nosebleeds, sneezing, sore throat and trouble swallowing.   Eyes: Negative for itching and visual disturbance.  Respiratory: Negative for cough.   Cardiovascular: Negative for chest pain, palpitations and leg swelling.  Gastrointestinal: Negative for abdominal distention, blood in stool, diarrhea and nausea.  Genitourinary: Positive for difficulty urinating, frequency and urgency. Negative for hematuria.  Musculoskeletal: Positive for arthralgias, back pain and neck pain. Negative for gait problem and joint swelling.  Skin: Negative for rash.  Neurological: Negative for dizziness, tremors, speech difficulty and weakness.  Psychiatric/Behavioral: Negative for agitation, dysphoric mood and sleep disturbance. The patient is not nervous/anxious.  Objective:  BP 114/78 (BP Location: Left Arm, Patient Position: Sitting, Cuff Size: Normal)   Pulse 64   Temp 98.2 F (36.8 C) (Oral)   Ht 5\' 10"  (1.778 m)   Wt 168 lb (76.2 kg)   SpO2 99%   BMI 24.11 kg/m   BP Readings from Last 3 Encounters:  01/02/18 114/78  11/04/17 119/79  11/01/17 108/84    Wt Readings from Last 3 Encounters:  01/02/18 168 lb (76.2 kg)  11/04/17 164 lb (74.4 kg)  11/01/17 165 lb (74.8 kg)    Physical Exam  Constitutional: He is oriented to person, place, and time. He appears well-developed. No  distress.  NAD  HENT:  Mouth/Throat: Oropharynx is clear and moist.  Eyes: Conjunctivae are normal. Pupils are equal, round, and reactive to light.  Neck: Normal range of motion. No JVD present. No thyromegaly present.  Cardiovascular: Normal rate, regular rhythm, normal heart sounds and intact distal pulses. Exam reveals no gallop and no friction rub.  No murmur heard. Pulmonary/Chest: Effort normal and breath sounds normal. No respiratory distress. He has no wheezes. He has no rales. He exhibits no tenderness.  Abdominal: Soft. Bowel sounds are normal. He exhibits no distension and no mass. There is no tenderness. There is no rebound and no guarding.  Musculoskeletal: Normal range of motion. He exhibits tenderness. He exhibits no edema.  Lymphadenopathy:    He has no cervical adenopathy.  Neurological: He is alert and oriented to person, place, and time. He has normal reflexes. No cranial nerve deficit. He exhibits normal muscle tone. He displays a negative Romberg sign. Coordination and gait normal.  Skin: Skin is warm and dry. No rash noted.  Psychiatric: He has a normal mood and affect. His behavior is normal. Judgment and thought content normal.  LS is tender w/ROM  Lab Results  Component Value Date   WBC 3.1 (L) 08/10/2017   HGB 14.2 08/10/2017   HCT 43.6 08/10/2017   PLT 238.0 08/10/2017   GLUCOSE 89 08/10/2017   CHOL 218 (H) 11/11/2016   TRIG 127.0 11/11/2016   HDL 55.50 11/11/2016   LDLDIRECT 140.3 10/26/2011   LDLCALC 137 (H) 11/11/2016   ALT 11 08/10/2017   AST 12 08/10/2017   NA 142 08/10/2017   K 4.7 08/10/2017   CL 109 08/10/2017   CREATININE 1.42 08/10/2017   BUN 24 (H) 08/10/2017   CO2 26 08/10/2017   TSH 2.94 11/11/2016   PSA 5.28 (H) 11/11/2016    Ct Abdomen Pelvis W Contrast  Result Date: 08/10/2017 CLINICAL DATA:  Worsening RIGHT lower quadrant pain over 3-4 days question appendicitis EXAM: CT ABDOMEN AND PELVIS WITH CONTRAST TECHNIQUE: Multidetector  CT imaging of the abdomen and pelvis was performed using the standard protocol following bolus administration of intravenous contrast. CONTRAST:  168mL ISOVUE-300 IOPAMIDOL (ISOVUE-300) INJECTION 61% IV. Dilute oral contrast. COMPARISON:  None FINDINGS: Lower chest: Lung bases clear Hepatobiliary: Tiny hypervascular nodule posterior RIGHT lobe liver 7 mm diameter question atypical hemangioma. Additional tiny hepatic cysts. Gallbladder and liver otherwise normal appearance. Pancreas: Normal appearance Spleen: Normal appearance Adrenals/Urinary Tract: Adrenal glands normal appearance. Nonobstructing 5 mm LEFT renal calculus image 28. Small LEFT renal cysts. No hydronephrosis or hydroureter. Bladder contains only a small amount of urine, incompletely distended, limiting assessment of wall thickness. Stomach/Bowel: Normal appendix. Scattered descending colonic diverticulosis. Retained stool in colon limits assessment for masses. Questionable small cecal nodule versus stool artifact 11 mm diameter, having a different appearance from stool throughout remainder  of colon. Stomach and bowel loops otherwise normal appearance. Vascular/Lymphatic: Aorta normal caliber without aneurysm. No definite adenopathy. Reproductive: Prostatic enlargement 5.5 x 4.3 x 4.1 cm. Seminal vesicles unremarkable. Other: No free air or free fluid. No inflammatory process or definite hernia. Musculoskeletal: Unremarkable IMPRESSION: Normal appendix. Small cysts and nonobstructing calculus in LEFT kidney. Scattered colonic diverticulosis without definite evidence of diverticulitis. Cannot exclude 11 mm diameter nodule/polyp at cecum ; followup non emergent colonoscopy recommended to exclude colonic polyp. Prostatic enlargement. Electronically Signed   By: Lavonia Dana M.D.   On: 08/10/2017 16:33    Assessment & Plan:   There are no diagnoses linked to this encounter. I am having Jorge Turner maintain his cholecalciferol, Vitamin B-12,  ketoconazole, mupirocin ointment, gabapentin, ANUSOL-HC, valACYclovir, traMADol, triamcinolone ointment, hydrOXYzine, omeprazole, BREO ELLIPTA, hyoscyamine, doxycycline, Diclofenac Sodium, ibuprofen, metroNIDAZOLE, ALIGN, meloxicam, and finasteride.  No orders of the defined types were placed in this encounter.    Follow-up: No Follow-up on file.  Walker Kehr, MD

## 2018-01-06 ENCOUNTER — Encounter (INDEPENDENT_AMBULATORY_CARE_PROVIDER_SITE_OTHER): Payer: Self-pay | Admitting: Specialist

## 2018-01-06 ENCOUNTER — Telehealth: Payer: Self-pay | Admitting: Internal Medicine

## 2018-01-06 ENCOUNTER — Ambulatory Visit (INDEPENDENT_AMBULATORY_CARE_PROVIDER_SITE_OTHER): Payer: Federal, State, Local not specified - PPO | Admitting: Specialist

## 2018-01-06 VITALS — BP 127/81 | HR 48 | Ht 70.0 in | Wt 164.0 lb

## 2018-01-06 DIAGNOSIS — M25531 Pain in right wrist: Secondary | ICD-10-CM | POA: Diagnosis not present

## 2018-01-06 DIAGNOSIS — M4722 Other spondylosis with radiculopathy, cervical region: Secondary | ICD-10-CM

## 2018-01-06 DIAGNOSIS — R200 Anesthesia of skin: Secondary | ICD-10-CM | POA: Diagnosis not present

## 2018-01-06 DIAGNOSIS — M1712 Unilateral primary osteoarthritis, left knee: Secondary | ICD-10-CM | POA: Diagnosis not present

## 2018-01-06 DIAGNOSIS — M1711 Unilateral primary osteoarthritis, right knee: Secondary | ICD-10-CM

## 2018-01-06 DIAGNOSIS — G5603 Carpal tunnel syndrome, bilateral upper limbs: Secondary | ICD-10-CM | POA: Diagnosis not present

## 2018-01-06 NOTE — Telephone Encounter (Signed)
The patient has dropped off a package with forms and other information, that he states Dr. Alain Marion has requested.     Forms have been placed in MD's box to review and please advise if you need me to help with any of it. Thank you.

## 2018-01-06 NOTE — Patient Instructions (Addendum)
The main ways of treat osteoarthritis, that are found to be success. Weight loss helps to decrease pain. Exercise is important to maintaining cartilage and thickness and strengthening. NSAIDs like motrin, tylenol, alleve are meds decreasing the inflamation. Ice is okay  In afternoon and evening and hot shower in the am Please call a month ahead of follow up appt so we can order new Synvisc One material. Use the splint for the right wrist continuously for 2 weeks then at night. VIt B complex and creel or fish oil. EMGs and NCV of the wrists and right arm ordered to assesss for Carpal tunnel syndrome vs C6 or C7 radiculopathy.

## 2018-01-06 NOTE — Progress Notes (Signed)
Office Visit Note   Patient: Jorge Turner           Date of Birth: November 27, 1956           MRN: [id number] Visit Date: 01/06/2018              Requested by: [name], MD [place],  [id number] PCP: [name], MD   Assessment & Plan: Visit Diagnoses:  1. Patellofemoral arthritis of left knee   2. Osteoarthritis of right patellofemoral joint   3. Pain in right wrist   4. Bilateral hand numbness     PlanThe main ways of treat osteoarthritis, that are found to be success. Weight loss helps to decrease pain. Exercise is important to maintaining cartilage and thickness and strengthening. NSAIDs like motrin, tylenol, alleve are meds decreasing the inflamation. Ice is okay  In afternoon and evening and hot shower in the am  Use the splint for the right wrist continuously for 2 weeks then at night. VIt B complex and creel or fish oil. EMGs and NCV of the wrists and right arm ordered to assess for Carpal tunnel syndrome vs C6 or C7 radiculopathy.   Follow-Up Instructions: Return in about 6 weeks (around 02/17/2018).   Orders:  Orders Placed This Encounter  Procedures  . Large Joint Inj  . Large Joint Inj   No orders of the defined types were placed in this encounter.     Procedures: Large Joint Inj: R knee on 01/06/2018 12:25 PM Indications: pain Details: 25 G 1.5 in needle, anterolateral approach  Arthrogram: No  Medications: 40 mg methylPREDNISolone acetate 40 MG/ML; 3 mL bupivacaine 0.5 % Outcome: tolerated well, no immediate complications  Bandaid applied Procedure, treatment alternatives, risks and benefits explained, specific risks discussed. Consent was given by the patient. Immediately prior to procedure a time out was called to verify the correct patient, procedure, equipment, support staff and site/side marked as required. Patient was prepped and draped in the usual sterile fashion.   Large Joint Inj: L knee on 01/06/2018 12:27  PM Indications: pain Details: 25 G 1.5 in needle, anterolateral approach  Arthrogram: No  Medications: 40 mg methylPREDNISolone acetate 40 MG/ML; 3 mL bupivacaine 0.5 % Outcome: tolerated well, no immediate complications  Bandaid applied Procedure, treatment alternatives, risks and benefits explained, specific risks discussed. Consent was given by the patient. Immediately prior to procedure a time out was called to verify the correct patient, procedure, equipment, support staff and site/side marked as required. Patient was prepped and draped in the usual sterile fashion.       Clinical Data: No additional findings.   Subjective: Chief Complaint  Patient presents with  . Follow-up    bilateral knee pain    HPI  Review of Systems   Objective: Vital Signs: BP 127/81   Pulse (!) 48   Ht 5\' 10"  (1.778 m)   Wt 164 lb (74.4 kg)   BMI 23.53 kg/m   Physical Exam  Ortho Exam  Specialty Comments:  No specialty comments available.  Imaging: No results found.   PMFS History: Patient Active Problem List   Diagnosis Date Noted  . Patella-femoral syndrome 10/11/2017  . RTI (respiratory tract infection) 07/19/2017  . Right hand weakness 03/07/2017  . Extensor intersection syndrome of right wrist 12/23/2016  . Hamstring tightness of right lower extremity 12/23/2016  . Carpal tunnel syndrome, right 06/07/2016  . Thumb pain 05/17/2016  . Paresthesia 01/20/2016  . CMC (carpometacarpal)  synovitis 01/05/2016  . Medial meniscus tear 10/15/2014  . Left knee pain 09/24/2014  . Neck muscle strain 03/11/2014  . Elevated PSA 06/11/2013  . Hx of balanitis 06/11/2013  . Knee pain, bilateral 02/09/2013  . Cough 02/09/2013  . Dental infection 06/15/2012  . Dysuria 12/31/2011  . Bladder neck obstruction 10/25/2011  . Rash 07/16/2011  . Urethritis 06/01/2011  . Headache(784.0) 04/05/2011  . Abdominal pain 04/05/2011  . NECK PAIN 10/19/2010  . DYSPLASTIC NEVUS, CHEST  03/23/2010  . Eczema 03/23/2010  . HYDROCELE, RIGHT 03/03/2010  . Nocturia 03/03/2010  . Personal history of colonic polyps 03/03/2010  . RECTAL BLEEDING 01/27/2010  . Unspecified disorder of male genital organs 01/27/2010  . NONSPECIFIC ABN FINDING RAD & OTH EXAM GU ORGAN 01/27/2010  . HEMATOCHEZIA 01/23/2010  . BENIGN PROSTATIC HYPERTROPHY 01/23/2010  . HAND PAIN 01/01/2010  . B12 deficiency 06/03/2009  . WRIST PAIN 06/03/2009  . Chest pain on respiration 04/25/2009  . SHOULDER STRAIN, RIGHT 04/25/2009  . FUNGAL DERMATITIS 04/12/2009  . LOW BACK PAIN 07/29/2008  . GERD 04/30/2008  . WEIGHT LOSS, ABNORMAL 04/30/2008  . HEMORRHOIDS 04/23/2008  . SEBACEOUS CYST, INFECTED 04/23/2008  . TENDINITIS, RIGHT ELBOW 10/21/2007   Past Medical History:  Diagnosis Date  . Balanitis   . BPH (benign prostatic hypertrophy)   . DJD (degenerative joint disease) of knee   . Eczema   . GERD (gastroesophageal reflux disease)   . Hemorrhoid   . Low back pain   . Personal history of colonic polyps 03/03/2010  . Vitamin B12 deficiency     Family History  Problem Relation Age of Onset  . Hypertension Mother   . Diabetes Mother   . Hypertension Father   . Diabetes Father   . Colon cancer Neg Hx   . Rectal cancer Neg Hx   . Stomach cancer Neg Hx     Past Surgical History:  Procedure Laterality Date  . CIRCUMCISION N/A 07/20/2013   Procedure: CIRCUMCISION ADULT;  Surgeon: Claybon Jabs, MD;  Location: [facility];  Service: Urology;  Laterality: N/A;  . COLONOSCOPY    . CYSTO REMOVAL RIGHT THUMB  2006  . RIGHT HYDROCELECTOMY  11-13-2010   Social History   Occupational History  . Occupation: Programmer, applications: RETIRED  Tobacco Use  . Smoking status: Never Smoker  . Smokeless tobacco: Never Used  Substance and Sexual Activity  . Alcohol use: No  . Drug use: No  . Sexual activity: Yes

## 2018-01-06 NOTE — Progress Notes (Deleted)
Office Visit Note   Patient: Jorge Turner           Date of Birth: 10/07/1956           MRN: [id number] Visit Date: 01/06/2018              Requested by: [name], MD [place], [place] [id number] PCP: [name], MD   Assessment & Plan: Visit Diagnoses: No diagnosis found.  Plan: ***  Follow-Up Instructions: No Follow-up on file.   Orders:  No orders of the defined types were placed in this encounter.  No orders of the defined types were placed in this encounter.     Procedures: No procedures performed   Clinical Data: No additional findings.   Subjective: Chief Complaint  Patient presents with  . Follow-up    bilateral knee pain    HPI  Review of Systems   Objective: Vital Signs: BP 127/81   Pulse (!) 48   Ht 5\' 10"  (1.778 m)   Wt 164 lb (74.4 kg)   BMI 23.53 kg/m   Physical Exam  Ortho Exam  Specialty Comments:  No specialty comments available.  Imaging: No results found.   PMFS History: Patient Active Problem List   Diagnosis Date Noted  . Patella-femoral syndrome 10/11/2017  . RTI (respiratory tract infection) 07/19/2017  . Right hand weakness 03/07/2017  . Extensor intersection syndrome of right wrist 12/23/2016  . Hamstring tightness of right lower extremity 12/23/2016  . Carpal tunnel syndrome, right 06/07/2016  . Thumb pain 05/17/2016  . Paresthesia 01/20/2016  . CMC (carpometacarpal) synovitis 01/05/2016  . Medial meniscus tear 10/15/2014  . Left knee pain 09/24/2014  . Neck muscle strain 03/11/2014  . Elevated PSA 06/11/2013  . Hx of balanitis 06/11/2013  . Knee pain, bilateral 02/09/2013  . Cough 02/09/2013  . Dental infection 06/15/2012  . Dysuria 12/31/2011  . Bladder neck obstruction 10/25/2011  . Rash 07/16/2011  . Urethritis 06/01/2011  . Headache(784.0) 04/05/2011  . Abdominal pain 04/05/2011  . NECK PAIN 10/19/2010  . DYSPLASTIC NEVUS, CHEST 03/23/2010  . Eczema 03/23/2010  .  HYDROCELE, RIGHT 03/03/2010  . Nocturia 03/03/2010  . Personal history of colonic polyps 03/03/2010  . RECTAL BLEEDING 01/27/2010  . Unspecified disorder of male genital organs 01/27/2010  . NONSPECIFIC ABN FINDING RAD & OTH EXAM GU ORGAN 01/27/2010  . HEMATOCHEZIA 01/23/2010  . BENIGN PROSTATIC HYPERTROPHY 01/23/2010  . HAND PAIN 01/01/2010  . B12 deficiency 06/03/2009  . WRIST PAIN 06/03/2009  . Chest pain on respiration 04/25/2009  . SHOULDER STRAIN, RIGHT 04/25/2009  . FUNGAL DERMATITIS 04/12/2009  . LOW BACK PAIN 07/29/2008  . GERD 04/30/2008  . WEIGHT LOSS, ABNORMAL 04/30/2008  . HEMORRHOIDS 04/23/2008  . SEBACEOUS CYST, INFECTED 04/23/2008  . TENDINITIS, RIGHT ELBOW 10/21/2007   Past Medical History:  Diagnosis Date  . Balanitis   . BPH (benign prostatic hypertrophy)   . DJD (degenerative joint disease) of knee   . Eczema   . GERD (gastroesophageal reflux disease)   . Hemorrhoid   . Low back pain   . Personal history of colonic polyps 03/03/2010  . Vitamin B12 deficiency     Family History  Problem Relation Age of Onset  . Hypertension Mother   . Diabetes Mother   . Hypertension Father   . Diabetes Father   . Colon cancer Neg Hx   . Rectal cancer Neg Hx   . Stomach cancer Neg Hx  Past Surgical History:  Procedure Laterality Date  . CIRCUMCISION N/A 07/20/2013   Procedure: CIRCUMCISION ADULT;  Surgeon: Claybon Jabs, MD;  Location: [facility];  Service: Urology;  Laterality: N/A;  . COLONOSCOPY    . CYSTO REMOVAL RIGHT THUMB  2006  . RIGHT HYDROCELECTOMY  11-13-2010   Social History   Occupational History  . Occupation: Programmer, applications: RETIRED  Tobacco Use  . Smoking status: Never Smoker  . Smokeless tobacco: Never Used  Substance and Sexual Activity  . Alcohol use: No  . Drug use: No  . Sexual activity: Yes

## 2018-01-08 NOTE — Telephone Encounter (Signed)
Noted thank you

## 2018-01-16 DIAGNOSIS — M545 Low back pain: Secondary | ICD-10-CM | POA: Diagnosis not present

## 2018-01-16 DIAGNOSIS — G5602 Carpal tunnel syndrome, left upper limb: Secondary | ICD-10-CM | POA: Diagnosis not present

## 2018-01-16 DIAGNOSIS — M542 Cervicalgia: Secondary | ICD-10-CM | POA: Diagnosis not present

## 2018-01-17 MED ORDER — METHYLPREDNISOLONE ACETATE 40 MG/ML IJ SUSP
40.0000 mg | INTRAMUSCULAR | Status: AC | PRN
  Administered 2018-01-06: 40 mg via INTRA_ARTICULAR

## 2018-01-17 MED ORDER — BUPIVACAINE HCL 0.5 % IJ SOLN
3.0000 mL | INTRAMUSCULAR | Status: AC | PRN
  Administered 2018-01-06: 3 mL via INTRA_ARTICULAR

## 2018-01-20 DIAGNOSIS — M545 Low back pain: Secondary | ICD-10-CM | POA: Diagnosis not present

## 2018-01-20 DIAGNOSIS — G5602 Carpal tunnel syndrome, left upper limb: Secondary | ICD-10-CM | POA: Diagnosis not present

## 2018-01-20 DIAGNOSIS — M542 Cervicalgia: Secondary | ICD-10-CM | POA: Diagnosis not present

## 2018-01-23 DIAGNOSIS — M545 Low back pain: Secondary | ICD-10-CM | POA: Diagnosis not present

## 2018-01-23 DIAGNOSIS — G5602 Carpal tunnel syndrome, left upper limb: Secondary | ICD-10-CM | POA: Diagnosis not present

## 2018-01-23 DIAGNOSIS — M542 Cervicalgia: Secondary | ICD-10-CM | POA: Diagnosis not present

## 2018-01-24 ENCOUNTER — Telehealth (INDEPENDENT_AMBULATORY_CARE_PROVIDER_SITE_OTHER): Payer: Self-pay | Admitting: Specialist

## 2018-01-24 NOTE — Telephone Encounter (Signed)
Spoke with patient. He has been informed.   He stated to let Dr.Plotnikov, how thankful he is.

## 2018-01-24 NOTE — Telephone Encounter (Signed)
I hope to get to it this Thursday Thx

## 2018-01-24 NOTE — Telephone Encounter (Signed)
Patient stopped by the clinic to see if Dr. Louanne Skye has completed his DBQ (Disability Benefit Questionnaire) form for the [place].

## 2018-01-24 NOTE — Telephone Encounter (Signed)
Pt checking on the status of paper work. Contact pt at [phone].

## 2018-01-24 NOTE — Telephone Encounter (Signed)
Patient stopped by the clinic to see if Dr. Louanne Skye has completed his DBQ (Disability Benefit Questionnaire) form for the [place].  CB#[phone].  Thank you.

## 2018-01-26 DIAGNOSIS — H524 Presbyopia: Secondary | ICD-10-CM | POA: Diagnosis not present

## 2018-01-26 DIAGNOSIS — H5213 Myopia, bilateral: Secondary | ICD-10-CM | POA: Diagnosis not present

## 2018-01-27 DIAGNOSIS — G5602 Carpal tunnel syndrome, left upper limb: Secondary | ICD-10-CM | POA: Diagnosis not present

## 2018-01-27 DIAGNOSIS — M542 Cervicalgia: Secondary | ICD-10-CM | POA: Diagnosis not present

## 2018-01-27 DIAGNOSIS — M545 Low back pain: Secondary | ICD-10-CM | POA: Diagnosis not present

## 2018-01-29 ENCOUNTER — Encounter: Payer: Self-pay | Admitting: Internal Medicine

## 2018-01-30 ENCOUNTER — Encounter: Payer: Self-pay | Admitting: Internal Medicine

## 2018-01-30 DIAGNOSIS — M545 Low back pain: Secondary | ICD-10-CM | POA: Diagnosis not present

## 2018-01-30 DIAGNOSIS — G5602 Carpal tunnel syndrome, left upper limb: Secondary | ICD-10-CM | POA: Diagnosis not present

## 2018-01-30 DIAGNOSIS — M542 Cervicalgia: Secondary | ICD-10-CM | POA: Diagnosis not present

## 2018-01-30 NOTE — Telephone Encounter (Signed)
Done. Jorge Turner has his folder Thx

## 2018-01-31 NOTE — Telephone Encounter (Signed)
Patient informed that they are ready to pick up.   Copy sent to scan &charged for.

## 2018-02-01 ENCOUNTER — Ambulatory Visit (INDEPENDENT_AMBULATORY_CARE_PROVIDER_SITE_OTHER): Payer: Federal, State, Local not specified - PPO | Admitting: Physical Medicine and Rehabilitation

## 2018-02-01 DIAGNOSIS — R202 Paresthesia of skin: Secondary | ICD-10-CM

## 2018-02-01 NOTE — Progress Notes (Signed)
 .  Numeric Pain Rating Scale and Functional Assessment Average Pain 6   In the last MONTH (on 0-10 scale) has pain interfered with the following?  1. General activity like being  able to carry out your everyday physical activities such as walking, climbing stairs, carrying groceries, or moving a chair?  Rating(3)   +Driver, -BT, -Dye Allergies.  

## 2018-02-02 DIAGNOSIS — Z0279 Encounter for issue of other medical certificate: Secondary | ICD-10-CM

## 2018-02-02 NOTE — Progress Notes (Signed)
Jorge Turner - 62 y.o. male MRN [id number]  Date of birth: [birth year]  Office Visit Note: Visit Date: 02/01/2018 PCP: [name], MD Referred by: [name], MD  Subjective: Chief Complaint  Patient presents with  . Right Shoulder - Pain  . Right Hand - Numbness  . Right Wrist - Pain, Numbness  . Right Thumb - Numbness  . Right Middle Finger - Numbness  . Right Ring Finger - Numbness  . Neck - Pain   HPI: [name] is a 62 year old right-hand-dominant gentleman who comes in today at the request of Dr. Louanne Skye for elective diagnostic studies of upper extremities.  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  He reports prior history of right wrist fracture.  Dr. Louanne Skye is been treating his wrist and his knees.  He has had different procedures performed on the wrist and the use and in general had a lot of orthopedic complaints.  In terms of his hands he really gets mostly right-sided neck shoulder arm pain with numbness and tingling into the right hand particularly in the radial digits.  He evidently has had a prior electrodiagnostic study by Dr. Otho Ket report but we do not have those details to review.  He was told he did have carpal tunnel syndrome.  He has been using wrist splints at times with some relief.  Again the symptoms are mainly in the thumb middle finger and ring finger.  He reports chronic symptoms for many years just getting worse.  He has worsening when he sleeps on his right side and I have a positive flick sign.  He has had physical therapy and medication trials.  He denies much in the way of left-sided complaints.    ROS Otherwise per HPI.  Assessment & Plan: Visit Diagnoses:  1. Paresthesia of skin     Plan: No additional findings.  Impression: The above electrodiagnostic study is ABNORMAL and reveals evidence of a moderate to severe right median nerve entrapment at the wrist (carpal tunnel syndrome) affecting sensory and motor  components.   There is no significant electrodiagnostic evidence of any other focal nerve entrapment, brachial plexopathy or cervical radiculopathy.   Recommendations: 1.  Follow-up with referring physician. 2.  Continue current management of symptoms. 3.  Continue use of resting splint at night-time and as needed during the day. 4.  Suggest surgical evaluation.   Meds & Orders: No orders of the defined types were placed in this encounter.   Orders Placed This Encounter  Procedures  . NCV with EMG (electromyography)    Follow-up: Return for Dr. Louanne Skye.   Procedures: No procedures performed  EMG & NCV Findings: Evaluation of the right median motor nerve showed prolonged distal onset latency (5.3 ms), reduced amplitude (4.9 mV), and decreased conduction velocity (Elbow-Wrist, 46 m/s).  The right median (across palm) sensory nerve showed prolonged distal peak latency (Wrist, 5.6 ms), reduced amplitude (9.0 V), and prolonged distal peak latency (Palm, 4.6 ms).  All remaining nerves (as indicated in the following tables) were within normal limits.  Left vs. Right side comparison data for the median motor nerve indicates abnormal L-R latency difference (2.3 ms) and abnormal L-R velocity difference (Elbow-Wrist, 12 m/s).    All examined muscles (as indicated in the following table) showed no evidence of electrical instability.    Impression: The above electrodiagnostic study is ABNORMAL and reveals evidence of a moderate to severe right median nerve entrapment at the wrist (  carpal tunnel syndrome) affecting sensory and motor components.   There is no significant electrodiagnostic evidence of any other focal nerve entrapment, brachial plexopathy or cervical radiculopathy.   Recommendations: 1.  Follow-up with referring physician. 2.  Continue current management of symptoms. 3.  Continue use of resting splint at night-time and as needed during the day. 4.  Suggest surgical  evaluation.   Nerve Conduction Studies Anti Sensory Summary Table   Stim Site NR Peak (ms) Norm Peak (ms) P-T Amp (V) Norm P-T Amp Site1 Site2 Delta-P (ms) Dist (cm) Vel (m/s) Norm Vel (m/s)  Left Median Acr Palm Anti Sensory (2nd Digit)  34.8C  Wrist    3.1 <3.6 22.9 >10 Wrist Palm 1.6 0.0    Palm    1.5 <2.0 21.3         Right Median Acr Palm Anti Sensory (2nd Digit)  32C  Wrist    *5.6 <3.6 *9.0 >10 Wrist Palm 1.0 0.0    Palm    *4.6 <2.0 4.0         Right Radial Anti Sensory (Base 1st Digit)  33.3C  Wrist    2.0 <3.1 20.6  Wrist Base 1st Digit 2.0 0.0    Right Ulnar Anti Sensory (5th Digit)  32.8C  Wrist    3.0 <3.7 23.3 >15.0 Wrist 5th Digit 3.0 14.0 47 >38   Motor Summary Table   Stim Site NR Onset (ms) Norm Onset (ms) O-P Amp (mV) Norm O-P Amp Site1 Site2 Delta-0 (ms) Dist (cm) Vel (m/s) Norm Vel (m/s)  Left Median Motor (Abd Poll Brev)  34.2C  Wrist    3.0 <4.2 8.4 >5 Elbow Wrist 4.2 24.5 58 >50  Elbow    7.2  7.9         Right Median Motor (Abd Poll Brev)  33.1C  Wrist    *5.3 <4.2 *4.9 >5 Elbow Wrist 5.2 24.0 *46 >50  Elbow    10.5  4.5         Right Ulnar Motor (Abd Dig Min)  32.7C  Wrist    2.7 <4.2 10.5 >3 B Elbow Wrist 3.8 24.0 63 >53  B Elbow    6.5  10.2  A Elbow B Elbow 1.5 9.5 63 >53  A Elbow    8.0  9.5          EMG   Side Muscle Nerve Root Ins Act Fibs Psw Amp Dur Poly Recrt Int Fraser Din Comment  Right 1stDorInt Ulnar C8-T1 Nml Nml Nml Nml Nml 0 Nml Nml   Right Abd Poll Brev Median C8-T1 Nml Nml Nml Nml Nml 0 Nml Nml   Right ExtDigCom   Nml Nml Nml Nml Nml 0 Nml Nml   Right Triceps Radial C6-7-8 Nml Nml Nml Nml Nml 0 Nml Nml   Right Deltoid Axillary C5-6 Nml Nml Nml Nml Nml 0 Nml Nml     Nerve Conduction Studies Anti Sensory Left/Right Comparison   Stim Site L Lat (ms) R Lat (ms) L-R Lat (ms) L Amp (V) R Amp (V) L-R Amp (%) Site1 Site2 L Vel (m/s) R Vel (m/s) L-R Vel (m/s)  Median Acr Palm Anti Sensory (2nd Digit)  34.8C  Wrist 3.1 *5.6 2.5  22.9 *9.0 60.7 Wrist Palm     Palm 1.5 *4.6 3.1 21.3 4.0 81.2       Radial Anti Sensory (Base 1st Digit)  33.3C  Wrist  2.0   20.6  Wrist Base 1st Digit  Ulnar Anti Sensory (5th Digit)  32.8C  Wrist  3.0   23.3  Wrist 5th Digit  47    Motor Left/Right Comparison   Stim Site L Lat (ms) R Lat (ms) L-R Lat (ms) L Amp (mV) R Amp (mV) L-R Amp (%) Site1 Site2 L Vel (m/s) R Vel (m/s) L-R Vel (m/s)  Median Motor (Abd Poll Brev)  34.2C  Wrist 3.0 *5.3 *2.3 8.4 *4.9 41.7 Elbow Wrist 58 *46 *12  Elbow 7.2 10.5 3.3 7.9 4.5 43.0       Ulnar Motor (Abd Dig Min)  32.7C  Wrist  2.7   10.5  B Elbow Wrist  63   B Elbow  6.5   10.2  A Elbow B Elbow  63   A Elbow  8.0   9.5           Waveforms:                Clinical History: No specialty comments available.  He reports that  has never smoked. he has never used smokeless tobacco. No results for input(s): HGBA1C, LABURIC in the last 8760 hours.  Objective:  VS:  HT:    WT:   BMI:     BP:   HR: bpm  TEMP: ( )  RESP:  Physical Exam  Musculoskeletal:  Inspection reveals no atrophy of the bilateral APB (mild flattening of the right APB) or FDI or hand intrinsics. There is no swelling, color changes, allodynia or dystrophic changes. There is 5 out of 5 strength in the bilateral wrist extension, finger abduction and long finger flexion.  There is subjective decreased sensation to light touch in the right median nerve distribution. There is a negative Hoffmann's test bilaterally.    Ortho Exam Imaging: No results found.  Past Medical/Family/Surgical/Social History: Medications & Allergies reviewed per EMR Patient Active Problem List   Diagnosis Date Noted  . Patella-femoral syndrome 10/11/2017  . RTI (respiratory tract infection) 07/19/2017  . Right hand weakness 03/07/2017  . Extensor intersection syndrome of right wrist 12/23/2016  . Hamstring tightness of right lower extremity 12/23/2016  . Carpal tunnel syndrome, right  06/07/2016  . Thumb pain 05/17/2016  . Paresthesia 01/20/2016  . CMC (carpometacarpal) synovitis 01/05/2016  . Medial meniscus tear 10/15/2014  . Left knee pain 09/24/2014  . Neck muscle strain 03/11/2014  . Elevated PSA 06/11/2013  . Hx of balanitis 06/11/2013  . Knee pain, bilateral 02/09/2013  . Cough 02/09/2013  . Dental infection 06/15/2012  . Dysuria 12/31/2011  . Bladder neck obstruction 10/25/2011  . Rash 07/16/2011  . Urethritis 06/01/2011  . Headache(784.0) 04/05/2011  . Abdominal pain 04/05/2011  . NECK PAIN 10/19/2010  . DYSPLASTIC NEVUS, CHEST 03/23/2010  . Eczema 03/23/2010  . HYDROCELE, RIGHT 03/03/2010  . Nocturia 03/03/2010  . Personal history of colonic polyps 03/03/2010  . RECTAL BLEEDING 01/27/2010  . Unspecified disorder of male genital organs 01/27/2010  . NONSPECIFIC ABN FINDING RAD & OTH EXAM GU ORGAN 01/27/2010  . HEMATOCHEZIA 01/23/2010  . BENIGN PROSTATIC HYPERTROPHY 01/23/2010  . HAND PAIN 01/01/2010  . B12 deficiency 06/03/2009  . WRIST PAIN 06/03/2009  . Chest pain on respiration 04/25/2009  . SHOULDER STRAIN, RIGHT 04/25/2009  . FUNGAL DERMATITIS 04/12/2009  . LOW BACK PAIN 07/29/2008  . GERD 04/30/2008  . WEIGHT LOSS, ABNORMAL 04/30/2008  . HEMORRHOIDS 04/23/2008  . SEBACEOUS CYST, INFECTED 04/23/2008  . TENDINITIS, RIGHT ELBOW 10/21/2007   Past Medical History:  Diagnosis Date  .  Balanitis   . BPH (benign prostatic hypertrophy)   . DJD (degenerative joint disease) of knee   . Eczema   . GERD (gastroesophageal reflux disease)   . Hemorrhoid   . Low back pain   . Medial meniscus tear    left  . Patellofemoral arthralgia of left knee   . Personal history of colonic polyps 03/03/2010  . Vitamin B12 deficiency    Family History  Problem Relation Age of Onset  . Hypertension Mother   . Diabetes Mother   . Hypertension Father   . Diabetes Father   . Colon cancer Neg Hx   . Rectal cancer Neg Hx   . Stomach cancer Neg Hx    Past  Surgical History:  Procedure Laterality Date  . CIRCUMCISION N/A 07/20/2013   Procedure: CIRCUMCISION ADULT;  Surgeon: Claybon Jabs, MD;  Location: [facility];  Service: Urology;  Laterality: N/A;  . COLONOSCOPY    . CYSTO REMOVAL RIGHT THUMB  2006  . RIGHT HYDROCELECTOMY  11-13-2010   Social History   Occupational History  . Occupation: Programmer, applications: RETIRED  Tobacco Use  . Smoking status: Never Smoker  . Smokeless tobacco: Never Used  Substance and Sexual Activity  . Alcohol use: No  . Drug use: No  . Sexual activity: Yes

## 2018-02-03 DIAGNOSIS — G5602 Carpal tunnel syndrome, left upper limb: Secondary | ICD-10-CM | POA: Diagnosis not present

## 2018-02-03 DIAGNOSIS — M542 Cervicalgia: Secondary | ICD-10-CM | POA: Diagnosis not present

## 2018-02-03 DIAGNOSIS — M545 Low back pain: Secondary | ICD-10-CM | POA: Diagnosis not present

## 2018-02-03 NOTE — Procedures (Signed)
EMG & NCV Findings: Evaluation of the right median motor nerve showed prolonged distal onset latency (5.3 ms), reduced amplitude (4.9 mV), and decreased conduction velocity (Elbow-Wrist, 46 m/s).  The right median (across palm) sensory nerve showed prolonged distal peak latency (Wrist, 5.6 ms), reduced amplitude (9.0 V), and prolonged distal peak latency (Palm, 4.6 ms).  All remaining nerves (as indicated in the following tables) were within normal limits.  Left vs. Right side comparison data for the median motor nerve indicates abnormal L-R latency difference (2.3 ms) and abnormal L-R velocity difference (Elbow-Wrist, 12 m/s).    All examined muscles (as indicated in the following table) showed no evidence of electrical instability.    Impression: The above electrodiagnostic study is ABNORMAL and reveals evidence of a moderate to severe right median nerve entrapment at the wrist (carpal tunnel syndrome) affecting sensory and motor components.   There is no significant electrodiagnostic evidence of any other focal nerve entrapment, brachial plexopathy or cervical radiculopathy.   Recommendations: 1.  Follow-up with referring physician. 2.  Continue current management of symptoms. 3.  Continue use of resting splint at night-time and as needed during the day. 4.  Suggest surgical evaluation.   Nerve Conduction Studies Anti Sensory Summary Table   Stim Site NR Peak (ms) Norm Peak (ms) P-T Amp (V) Norm P-T Amp Site1 Site2 Delta-P (ms) Dist (cm) Vel (m/s) Norm Vel (m/s)  Left Median Acr Palm Anti Sensory (2nd Digit)  34.8C  Wrist    3.1 <3.6 22.9 >10 Wrist Palm 1.6 0.0    Palm    1.5 <2.0 21.3         Right Median Acr Palm Anti Sensory (2nd Digit)  32C  Wrist    *5.6 <3.6 *9.0 >10 Wrist Palm 1.0 0.0    Palm    *4.6 <2.0 4.0         Right Radial Anti Sensory (Base 1st Digit)  33.3C  Wrist    2.0 <3.1 20.6  Wrist Base 1st Digit 2.0 0.0    Right Ulnar Anti Sensory (5th Digit)  32.8C    Wrist    3.0 <3.7 23.3 >15.0 Wrist 5th Digit 3.0 14.0 47 >38   Motor Summary Table   Stim Site NR Onset (ms) Norm Onset (ms) O-P Amp (mV) Norm O-P Amp Site1 Site2 Delta-0 (ms) Dist (cm) Vel (m/s) Norm Vel (m/s)  Left Median Motor (Abd Poll Brev)  34.2C  Wrist    3.0 <4.2 8.4 >5 Elbow Wrist 4.2 24.5 58 >50  Elbow    7.2  7.9         Right Median Motor (Abd Poll Brev)  33.1C  Wrist    *5.3 <4.2 *4.9 >5 Elbow Wrist 5.2 24.0 *46 >50  Elbow    10.5  4.5         Right Ulnar Motor (Abd Dig Min)  32.7C  Wrist    2.7 <4.2 10.5 >3 B Elbow Wrist 3.8 24.0 63 >53  B Elbow    6.5  10.2  A Elbow B Elbow 1.5 9.5 63 >53  A Elbow    8.0  9.5          EMG   Side Muscle Nerve Root Ins Act Fibs Psw Amp Dur Poly Recrt Int Fraser Din Comment  Right 1stDorInt Ulnar C8-T1 Nml Nml Nml Nml Nml 0 Nml Nml   Right Abd Poll Brev Median C8-T1 Nml Nml Nml Nml Nml 0 Nml Nml   Right ExtDigCom  Nml Nml Nml Nml Nml 0 Nml Nml   Right Triceps Radial C6-7-8 Nml Nml Nml Nml Nml 0 Nml Nml   Right Deltoid Axillary C5-6 Nml Nml Nml Nml Nml 0 Nml Nml     Nerve Conduction Studies Anti Sensory Left/Right Comparison   Stim Site L Lat (ms) R Lat (ms) L-R Lat (ms) L Amp (V) R Amp (V) L-R Amp (%) Site1 Site2 L Vel (m/s) R Vel (m/s) L-R Vel (m/s)  Median Acr Palm Anti Sensory (2nd Digit)  34.8C  Wrist 3.1 *5.6 2.5 22.9 *9.0 60.7 Wrist Palm     Palm 1.5 *4.6 3.1 21.3 4.0 81.2       Radial Anti Sensory (Base 1st Digit)  33.3C  Wrist  2.0   20.6  Wrist Base 1st Digit     Ulnar Anti Sensory (5th Digit)  32.8C  Wrist  3.0   23.3  Wrist 5th Digit  47    Motor Left/Right Comparison   Stim Site L Lat (ms) R Lat (ms) L-R Lat (ms) L Amp (mV) R Amp (mV) L-R Amp (%) Site1 Site2 L Vel (m/s) R Vel (m/s) L-R Vel (m/s)  Median Motor (Abd Poll Brev)  34.2C  Wrist 3.0 *5.3 *2.3 8.4 *4.9 41.7 Elbow Wrist 58 *46 *12  Elbow 7.2 10.5 3.3 7.9 4.5 43.0       Ulnar Motor (Abd Dig Min)  32.7C  Wrist  2.7   10.5  B Elbow Wrist  63   B Elbow   6.5   10.2  A Elbow B Elbow  63   A Elbow  8.0   9.5           Waveforms:

## 2018-02-08 ENCOUNTER — Other Ambulatory Visit (INDEPENDENT_AMBULATORY_CARE_PROVIDER_SITE_OTHER): Payer: Federal, State, Local not specified - PPO

## 2018-02-08 DIAGNOSIS — E538 Deficiency of other specified B group vitamins: Secondary | ICD-10-CM | POA: Diagnosis not present

## 2018-02-08 DIAGNOSIS — M545 Low back pain: Secondary | ICD-10-CM

## 2018-02-08 DIAGNOSIS — R972 Elevated prostate specific antigen [PSA]: Secondary | ICD-10-CM

## 2018-02-08 DIAGNOSIS — G8929 Other chronic pain: Secondary | ICD-10-CM

## 2018-02-08 DIAGNOSIS — K219 Gastro-esophageal reflux disease without esophagitis: Secondary | ICD-10-CM

## 2018-02-08 LAB — URINALYSIS
Bilirubin Urine: NEGATIVE
KETONES UR: NEGATIVE
LEUKOCYTES UA: NEGATIVE
NITRITE: NEGATIVE
PH: 6 (ref 5.0–8.0)
SPECIFIC GRAVITY, URINE: 1.02 (ref 1.000–1.030)
Total Protein, Urine: NEGATIVE
UROBILINOGEN UA: 0.2 (ref 0.0–1.0)
Urine Glucose: NEGATIVE

## 2018-02-08 LAB — CBC WITH DIFFERENTIAL/PLATELET
BASOS ABS: 0 10*3/uL (ref 0.0–0.1)
Basophils Relative: 0.3 % (ref 0.0–3.0)
EOS ABS: 0.1 10*3/uL (ref 0.0–0.7)
Eosinophils Relative: 2.2 % (ref 0.0–5.0)
HEMATOCRIT: 44.5 % (ref 39.0–52.0)
HEMOGLOBIN: 14.8 g/dL (ref 13.0–17.0)
LYMPHS PCT: 33.1 % (ref 12.0–46.0)
Lymphs Abs: 1.1 10*3/uL (ref 0.7–4.0)
MCHC: 33.3 g/dL (ref 30.0–36.0)
MCV: 91.1 fl (ref 78.0–100.0)
Monocytes Absolute: 0.5 10*3/uL (ref 0.1–1.0)
Monocytes Relative: 13.9 % — ABNORMAL HIGH (ref 3.0–12.0)
NEUTROS ABS: 1.6 10*3/uL (ref 1.4–7.7)
Neutrophils Relative %: 50.5 % (ref 43.0–77.0)
PLATELETS: 244 10*3/uL (ref 150.0–400.0)
RBC: 4.88 Mil/uL (ref 4.22–5.81)
RDW: 14.2 % (ref 11.5–15.5)
WBC: 3.2 10*3/uL — AB (ref 4.0–10.5)

## 2018-02-08 LAB — HEPATIC FUNCTION PANEL
ALBUMIN: 4 g/dL (ref 3.5–5.2)
ALT: 11 U/L (ref 0–53)
AST: 9 U/L (ref 0–37)
Alkaline Phosphatase: 76 U/L (ref 39–117)
Bilirubin, Direct: 0.1 mg/dL (ref 0.0–0.3)
TOTAL PROTEIN: 6.5 g/dL (ref 6.0–8.3)
Total Bilirubin: 0.7 mg/dL (ref 0.2–1.2)

## 2018-02-08 LAB — BASIC METABOLIC PANEL
BUN: 17 mg/dL (ref 6–23)
CALCIUM: 9.5 mg/dL (ref 8.4–10.5)
CO2: 27 mEq/L (ref 19–32)
CREATININE: 1.24 mg/dL (ref 0.40–1.50)
Chloride: 106 mEq/L (ref 96–112)
GFR: 76.21 mL/min (ref >60.00)
GLUCOSE: 99 mg/dL (ref 70–99)
Potassium: 4.4 mEq/L (ref 3.5–5.1)
SODIUM: 140 meq/L (ref 135–145)

## 2018-02-08 LAB — LIPID PANEL
CHOL/HDL RATIO: 3
Cholesterol: 187 mg/dL (ref 0–200)
HDL: 55.5 mg/dL (ref >39.00)
LDL CALC: 113 mg/dL — AB (ref 0–99)
NonHDL: 131.23
TRIGLYCERIDES: 92 mg/dL (ref 0.0–149.0)
VLDL: 18.4 mg/dL (ref 0.0–40.0)

## 2018-02-08 LAB — PSA: PSA: 4.13 ng/mL — ABNORMAL HIGH (ref 0.10–4.00)

## 2018-02-08 LAB — TSH: TSH: 3.94 u[IU]/mL (ref 0.35–4.50)

## 2018-02-10 DIAGNOSIS — M545 Low back pain: Secondary | ICD-10-CM | POA: Diagnosis not present

## 2018-02-10 DIAGNOSIS — G5602 Carpal tunnel syndrome, left upper limb: Secondary | ICD-10-CM | POA: Diagnosis not present

## 2018-02-10 DIAGNOSIS — M542 Cervicalgia: Secondary | ICD-10-CM | POA: Diagnosis not present

## 2018-02-14 DIAGNOSIS — M545 Low back pain: Secondary | ICD-10-CM | POA: Diagnosis not present

## 2018-02-14 DIAGNOSIS — G5602 Carpal tunnel syndrome, left upper limb: Secondary | ICD-10-CM | POA: Diagnosis not present

## 2018-02-14 DIAGNOSIS — M542 Cervicalgia: Secondary | ICD-10-CM | POA: Diagnosis not present

## 2018-02-14 NOTE — Telephone Encounter (Signed)
Pt called to check on his paperwork. Pt has appt 02/17/18 @10 :45am

## 2018-02-15 NOTE — Telephone Encounter (Signed)
Pt called to check on his paperwork. Pt has appt 02/17/18 @10 :45am

## 2018-02-17 ENCOUNTER — Ambulatory Visit (INDEPENDENT_AMBULATORY_CARE_PROVIDER_SITE_OTHER): Payer: Federal, State, Local not specified - PPO | Admitting: Specialist

## 2018-02-17 ENCOUNTER — Encounter (INDEPENDENT_AMBULATORY_CARE_PROVIDER_SITE_OTHER): Payer: Self-pay | Admitting: Specialist

## 2018-02-17 VITALS — BP 118/80 | HR 52 | Ht 70.0 in | Wt 164.0 lb

## 2018-02-17 DIAGNOSIS — M5136 Other intervertebral disc degeneration, lumbar region: Secondary | ICD-10-CM

## 2018-02-17 DIAGNOSIS — M19011 Primary osteoarthritis, right shoulder: Secondary | ICD-10-CM | POA: Diagnosis not present

## 2018-02-17 DIAGNOSIS — M47816 Spondylosis without myelopathy or radiculopathy, lumbar region: Secondary | ICD-10-CM

## 2018-02-17 DIAGNOSIS — G5601 Carpal tunnel syndrome, right upper limb: Secondary | ICD-10-CM | POA: Diagnosis not present

## 2018-02-17 DIAGNOSIS — M7581 Other shoulder lesions, right shoulder: Secondary | ICD-10-CM | POA: Diagnosis not present

## 2018-02-17 DIAGNOSIS — M1712 Unilateral primary osteoarthritis, left knee: Secondary | ICD-10-CM | POA: Diagnosis not present

## 2018-02-17 DIAGNOSIS — M47812 Spondylosis without myelopathy or radiculopathy, cervical region: Secondary | ICD-10-CM

## 2018-02-17 DIAGNOSIS — M19131 Post-traumatic osteoarthritis, right wrist: Secondary | ICD-10-CM
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
## 2018-02-17 NOTE — Patient Instructions (Signed)
  Knee is suffering from osteoarthritis, only real proven treatments are Weight loss, NSIADs like diclofenac and exercise. Well padded shoes help. Ice the knee 2-3 times a day 15-20 mins at a time. Avoid frequent bending and stooping  No lifting greater than 10 lbs. May use ice or moist heat for pain. Weight loss is of benefit. Handicap license is approved.  Avoid overhead lifting and overhead use of the arms. Do not lift greater than 10 lbs. Tylenol ES one every 6-8 hours for pain and inflamation.  Continue with PT for another 2-3 weeks, then a home exercise program. Right carpal tunnel syndrome is moderate-severe so surgery will likely improve this condition and you should consider a carpal tunnel release As there is documented worsening of the studies on the right wrist.

## 2018-02-21 DIAGNOSIS — G5602 Carpal tunnel syndrome, left upper limb: Secondary | ICD-10-CM | POA: Diagnosis not present

## 2018-02-21 DIAGNOSIS — M542 Cervicalgia: Secondary | ICD-10-CM | POA: Diagnosis not present

## 2018-02-21 DIAGNOSIS — M545 Low back pain: Secondary | ICD-10-CM | POA: Diagnosis not present

## 2018-02-22 ENCOUNTER — Telehealth (INDEPENDENT_AMBULATORY_CARE_PROVIDER_SITE_OTHER): Payer: Self-pay | Admitting: Specialist

## 2018-02-22 NOTE — Telephone Encounter (Signed)
Jorge Turner called to send a reminder to Dr.Nitka about his paperwork he is currently working on.

## 2018-02-23 ENCOUNTER — Encounter: Payer: Self-pay | Admitting: Family Medicine

## 2018-02-24 DIAGNOSIS — G5602 Carpal tunnel syndrome, left upper limb: Secondary | ICD-10-CM | POA: Diagnosis not present

## 2018-02-24 DIAGNOSIS — M545 Low back pain: Secondary | ICD-10-CM | POA: Diagnosis not present

## 2018-02-24 DIAGNOSIS — M542 Cervicalgia: Secondary | ICD-10-CM | POA: Diagnosis not present

## 2018-02-28 DIAGNOSIS — M542 Cervicalgia: Secondary | ICD-10-CM | POA: Diagnosis not present

## 2018-02-28 DIAGNOSIS — G5602 Carpal tunnel syndrome, left upper limb: Secondary | ICD-10-CM | POA: Diagnosis not present

## 2018-02-28 DIAGNOSIS — M545 Low back pain: Secondary | ICD-10-CM | POA: Diagnosis not present

## 2018-03-02 ENCOUNTER — Telehealth (INDEPENDENT_AMBULATORY_CARE_PROVIDER_SITE_OTHER): Payer: Self-pay

## 2018-03-02 NOTE — Telephone Encounter (Signed)
Patient wants to schedule CTR.  Need surgery order.

## 2018-03-03 DIAGNOSIS — M545 Low back pain: Secondary | ICD-10-CM | POA: Diagnosis not present

## 2018-03-03 DIAGNOSIS — M542 Cervicalgia: Secondary | ICD-10-CM | POA: Diagnosis not present

## 2018-03-03 DIAGNOSIS — G5602 Carpal tunnel syndrome, left upper limb: Secondary | ICD-10-CM | POA: Diagnosis not present

## 2018-03-06 ENCOUNTER — Telehealth: Payer: Self-pay | Admitting: Family Medicine

## 2018-03-06 ENCOUNTER — Telehealth: Payer: Self-pay | Admitting: Internal Medicine

## 2018-03-06 NOTE — Telephone Encounter (Signed)
Pt dropped off a revision he would like to have made to the letter Dr. Alain Marion wrote for him dated 01/27/18.  Per his [place] Consultant, this revision should be made.  An example has been included in the folder he dropped off.  Pt is also seeking a revision to a letter Dr. Tamala Julian wrote for him on 02/23/18.  Pt would like to pick both revised letters up at the same time.  He can be reached at [phone] w/any questions.  Folder put in Dr. Judeen Hammans box for pick up.

## 2018-03-06 NOTE — Telephone Encounter (Signed)
Pt dropped off a revision he would like to have made to the letter Dr. Tamala Julian wrote for him dated 02/23/18.  Per his [place] Consultant, this revision should be made.  An example has been included in the folder he dropped off.  Pt is also seeking a revision to a letter Dr. Alain Marion wrote for him on 3.1.19.  Pt would like to pick both revised letters up at the same time.  He can be reached at [phone] w/any questions.  Folder given to [place].

## 2018-03-07 DIAGNOSIS — M545 Low back pain: Secondary | ICD-10-CM | POA: Diagnosis not present

## 2018-03-07 DIAGNOSIS — G5602 Carpal tunnel syndrome, left upper limb: Secondary | ICD-10-CM | POA: Diagnosis not present

## 2018-03-07 DIAGNOSIS — M542 Cervicalgia: Secondary | ICD-10-CM | POA: Diagnosis not present

## 2018-03-09 NOTE — Telephone Encounter (Signed)
Given to plotnikov

## 2018-03-10 DIAGNOSIS — M542 Cervicalgia: Secondary | ICD-10-CM | POA: Diagnosis not present

## 2018-03-10 DIAGNOSIS — G5602 Carpal tunnel syndrome, left upper limb: Secondary | ICD-10-CM | POA: Diagnosis not present

## 2018-03-10 DIAGNOSIS — M545 Low back pain: Secondary | ICD-10-CM | POA: Diagnosis not present

## 2018-03-13 ENCOUNTER — Other Ambulatory Visit (INDEPENDENT_AMBULATORY_CARE_PROVIDER_SITE_OTHER): Payer: Self-pay | Admitting: Specialist

## 2018-03-13 DIAGNOSIS — M47896 Other spondylosis, lumbar region: Secondary | ICD-10-CM | POA: Diagnosis not present

## 2018-03-13 DIAGNOSIS — M9983 Other biomechanical lesions of lumbar region: Secondary | ICD-10-CM | POA: Diagnosis not present

## 2018-03-13 DIAGNOSIS — G5602 Carpal tunnel syndrome, left upper limb: Secondary | ICD-10-CM | POA: Diagnosis not present

## 2018-03-13 DIAGNOSIS — M545 Low back pain: Secondary | ICD-10-CM | POA: Diagnosis not present

## 2018-03-13 DIAGNOSIS — M542 Cervicalgia: Secondary | ICD-10-CM | POA: Diagnosis not present

## 2018-03-13 DIAGNOSIS — M48061 Spinal stenosis, lumbar region without neurogenic claudication: Secondary | ICD-10-CM | POA: Diagnosis not present

## 2018-03-14 NOTE — Telephone Encounter (Signed)
Sending reminder for pt paperwork

## 2018-03-16 ENCOUNTER — Encounter: Payer: Self-pay | Admitting: Family Medicine

## 2018-03-16 NOTE — Telephone Encounter (Signed)
I have finished it this morning Just returned the folder to lindsay

## 2018-03-16 NOTE — Telephone Encounter (Signed)
Pt made aware folder is at front desk for pt to come by & pickup.

## 2018-03-17 ENCOUNTER — Encounter: Payer: Self-pay | Admitting: Physician Assistant

## 2018-03-17 ENCOUNTER — Ambulatory Visit: Payer: Federal, State, Local not specified - PPO | Admitting: Physician Assistant

## 2018-03-17 ENCOUNTER — Other Ambulatory Visit: Payer: Self-pay

## 2018-03-17 VITALS — BP 114/80 | HR 61 | Temp 98.3°F | Resp 18 | Ht 70.0 in | Wt 164.6 lb

## 2018-03-17 DIAGNOSIS — L299 Pruritus, unspecified: Secondary | ICD-10-CM

## 2018-03-17 MED ORDER — CLOBETASOL PROPIONATE 0.05 % EX OINT: 1.0000 "application " | TOPICAL_OINTMENT | Freq: Two times a day (BID) | CUTANEOUS | 0 refills | Status: DC

## 2018-03-17 MED ORDER — PERMETHRIN 5 % EX CREA: TOPICAL_CREAM | CUTANEOUS | 1 refills | Status: DC

## 2018-03-17 NOTE — Patient Instructions (Signed)
     IF you received an x-ray today, you will receive an invoice from [facility]. Please contact [facility] at [phone] with questions or concerns regarding your invoice.   IF you received labwork today, you will receive an invoice from [facility]. Please contact [facility] at [phone] with questions or concerns regarding your invoice.   Our billing staff will not be able to assist you with questions regarding bills from these companies.  You will be contacted with the lab results as soon as they are available. The fastest way to get your results is to activate your My Chart account. Instructions are located on the last page of this paperwork. If you have not heard from us regarding the results in 2 weeks, please contact this office.     

## 2018-03-17 NOTE — Progress Notes (Signed)
03/17/2018 6:19 PM   DOB: 02-15-1956 / MRN: [id number]  SUBJECTIVE:  Jorge Turner is a 62 y.o. male presenting for rash.  Rash is present about the bilateral hands wrist arms the rash is pruritic. Tells me it started after working out in the yard.  Denies any body staying in the house with him that is different than normal.  He is allergic to erythromycin ethylsuccinate and doxycycline.   He  has a past medical history of Balanitis, BPH (benign prostatic hypertrophy), DJD (degenerative joint disease) of knee, Eczema, GERD (gastroesophageal reflux disease), Hemorrhoid, Low back pain, Medial meniscus tear, Patellofemoral arthralgia of left knee, Personal history of colonic polyps (03/03/2010), and Vitamin B12 deficiency.    He  reports that he has never smoked. He has never used smokeless tobacco. He reports that he does not drink alcohol or use drugs. He  reports that he currently engages in sexual activity. The patient  has a past surgical history that includes CYSTO REMOVAL RIGHT THUMB (2006); RIGHT HYDROCELECTOMY (11-13-2010); Circumcision (N/A, 07/20/2013); and Colonoscopy.  His family history includes Diabetes in his father and mother; Hypertension in his father and mother.  Review of Systems  Skin: Positive for itching and rash.    The problem list and medications were reviewed and updated by myself where necessary and exist elsewhere in the encounter.   OBJECTIVE:  BP 114/80 (BP Location: Right Arm, Patient Position: Sitting, Cuff Size: Normal)   Pulse 61   Temp 98.3 F (36.8 C) (Oral)   Resp 18   Ht 5\' 10"  (1.778 m)   Wt 164 lb 9.6 oz (74.7 kg)   SpO2 98%   BMI 23.62 kg/m   Physical Exam  Constitutional: He is oriented to person, place, and time. He appears well-developed. He does not appear ill.  Eyes: Pupils are equal, round, and reactive to light. Conjunctivae and EOM are normal.  Cardiovascular: Normal rate.  Pulmonary/Chest: Effort normal.  Abdominal: He exhibits no  distension.  Musculoskeletal: Normal range of motion.  Neurological: He is alert and oriented to person, place, and time. No cranial nerve deficit. Coordination normal.  Skin: Skin is warm and dry. He is not diaphoretic.  Several erythematous 2 to 3 mm papules about the wrist forearm and elbows bilaterally.  Some with excoriation.  Psychiatric: He has a normal mood and affect.  Nursing note and vitals reviewed.   No results found for this or any previous visit (from the past 72 hour(s)).  No results found.  ASSESSMENT AND PLAN:  Jorge Turner was seen today for poison ivy.  Diagnoses and all orders for this visit:  Pruritic condition: I think it is most likely that this patient has a mite infestation which is early.  Starting him on permethrin.  In the event that he does not the permethrin will not hurt and there are no lesions near his eyes or face or groin that would indicate the need for p.o. steroids. -     permethrin (ELIMITE) 5 % cream; Apply from the neck down and leave on for 12 hours. Repeat in one week. -     clobetasol ointment (TEMOVATE) 0.05 %; Apply 1 application topically 2 (two) times daily.    The patient is advised to call or return to clinic if he does not see an improvement in symptoms, or to seek the care of the closest emergency department if he worsens with the above plan.   Philis Fendt, MHS, PA-C Primary [facility] at [facility]  [facility] 03/17/2018 6:19 PM

## 2018-03-21 ENCOUNTER — Telehealth (INDEPENDENT_AMBULATORY_CARE_PROVIDER_SITE_OTHER): Payer: Self-pay | Admitting: Specialist

## 2018-03-21 NOTE — Telephone Encounter (Signed)
Pt called to check on the status of paperwork  

## 2018-03-23 ENCOUNTER — Telehealth (INDEPENDENT_AMBULATORY_CARE_PROVIDER_SITE_OTHER): Payer: Self-pay | Admitting: Specialist

## 2018-03-23 NOTE — Telephone Encounter (Signed)
Pt checking status on the letter being revised and if its ready for pick up.

## 2018-03-23 NOTE — Telephone Encounter (Signed)
Patient requesting a call back from you # [phone]

## 2018-03-24 DIAGNOSIS — G5602 Carpal tunnel syndrome, left upper limb: Secondary | ICD-10-CM | POA: Diagnosis not present

## 2018-03-24 DIAGNOSIS — M545 Low back pain: Secondary | ICD-10-CM | POA: Diagnosis not present

## 2018-03-24 DIAGNOSIS — M542 Cervicalgia: Secondary | ICD-10-CM | POA: Diagnosis not present

## 2018-03-27 ENCOUNTER — Encounter: Payer: Self-pay | Admitting: Internal Medicine

## 2018-03-27 DIAGNOSIS — M545 Low back pain: Secondary | ICD-10-CM | POA: Diagnosis not present

## 2018-03-27 DIAGNOSIS — G5602 Carpal tunnel syndrome, left upper limb: Secondary | ICD-10-CM | POA: Diagnosis not present

## 2018-03-27 DIAGNOSIS — M542 Cervicalgia: Secondary | ICD-10-CM | POA: Diagnosis not present

## 2018-03-27 NOTE — Telephone Encounter (Signed)
Letter up front ready for pt to pick up, pt notified

## 2018-03-28 NOTE — Telephone Encounter (Signed)
I called and lmom that I was returning his call, that if he was checking on his papers that they have not been completed at this that as soon as they ar done that I would call him and let him know.--Otherwise he cann call me back and leave a DETAILED message with the front desk regardingwhat he is needing.

## 2018-03-31 DIAGNOSIS — M545 Low back pain: Secondary | ICD-10-CM | POA: Diagnosis not present

## 2018-03-31 DIAGNOSIS — G5602 Carpal tunnel syndrome, left upper limb: Secondary | ICD-10-CM | POA: Diagnosis not present

## 2018-03-31 DIAGNOSIS — M542 Cervicalgia: Secondary | ICD-10-CM | POA: Diagnosis not present

## 2018-04-03 ENCOUNTER — Encounter: Payer: Self-pay | Admitting: Internal Medicine

## 2018-04-03 ENCOUNTER — Ambulatory Visit (INDEPENDENT_AMBULATORY_CARE_PROVIDER_SITE_OTHER): Payer: Federal, State, Local not specified - PPO | Admitting: Internal Medicine

## 2018-04-03 VITALS — BP 112/64 | HR 64 | Temp 98.4°F | Ht 70.0 in | Wt 163.0 lb

## 2018-04-03 DIAGNOSIS — E538 Deficiency of other specified B group vitamins: Secondary | ICD-10-CM

## 2018-04-03 DIAGNOSIS — R972 Elevated prostate specific antigen [PSA]: Secondary | ICD-10-CM

## 2018-04-03 DIAGNOSIS — M542 Cervicalgia: Secondary | ICD-10-CM

## 2018-04-03 DIAGNOSIS — R634 Abnormal weight loss: Secondary | ICD-10-CM

## 2018-04-03 DIAGNOSIS — M545 Low back pain, unspecified: Secondary | ICD-10-CM

## 2018-04-03 DIAGNOSIS — Z0001 Encounter for general adult medical examination with abnormal findings: Secondary | ICD-10-CM

## 2018-04-03 DIAGNOSIS — R29898 Other symptoms and signs involving the musculoskeletal system: Secondary | ICD-10-CM

## 2018-04-03 DIAGNOSIS — R21 Rash and other nonspecific skin eruption: Secondary | ICD-10-CM | POA: Diagnosis not present

## 2018-04-03 DIAGNOSIS — Z0000 Encounter for general adult medical examination without abnormal findings: Secondary | ICD-10-CM | POA: Insufficient documentation

## 2018-04-03 DIAGNOSIS — G8929 Other chronic pain: Secondary | ICD-10-CM

## 2018-04-03 MED ORDER — ALIGN 4 MG PO CAPS: 1.0000 | ORAL_CAPSULE | Freq: Every day | ORAL | 5 refills | Status: DC

## 2018-04-03 MED ORDER — HYDROCORTISONE 2.5 % RE CREA
TOPICAL_CREAM | RECTAL | 2 refills | Status: DC
Start: 2018-04-03 — End: 2020-07-03

## 2018-04-03 MED ORDER — MOMETASONE FUROATE 0.1 % EX SOLN: Freq: Every day | CUTANEOUS | 2 refills | Status: DC

## 2018-04-03 NOTE — Assessment & Plan Note (Signed)
Wt Readings from Last 3 Encounters:  04/03/18 163 lb (73.9 kg)  03/17/18 164 lb 9.6 oz (74.7 kg)  02/17/18 164 lb (74.4 kg)

## 2018-04-03 NOTE — Assessment & Plan Note (Signed)
Surgery pending R CTS - Dr Louanne Skye

## 2018-04-03 NOTE — Assessment & Plan Note (Addendum)
We discussed age appropriate health related issues, including available/recomended screening tests and vaccinations. We discussed a need for adhering to healthy diet and exercise. Labs were ordered to be later reviewed . All questions were answered. Colon 2018

## 2018-04-03 NOTE — Progress Notes (Signed)
Subjective:  Patient ID: Jorge Turner, male    DOB: [birth year]  Age: 62 y.o. MRN: [id number]  CC: No chief complaint on file.   HPI KOBY PICKUP presents for a well exam  Outpatient Medications Prior to Visit  Medication Sig Dispense Refill  . ANUSOL-HC 2.5 % rectal cream APPLY TO AFFECTED AREA TWICE A DAY AS NEEDED 30 g 0  . BREO ELLIPTA 100-25 MCG/INH AEPB INHALE 1 PUFF INTO THE LUNGS EVERY DAY 60 each 11  . cholecalciferol (VITAMIN D) 1000 UNITS tablet Take 1,000 Units by mouth daily.      . clobetasol ointment (TEMOVATE) 7.82 % Apply 1 application topically 2 (two) times daily. 30 g 0  . Cyanocobalamin (VITAMIN B-12) 1000 MCG SUBL Place 1 tablet (1,000 mcg total) under the tongue daily. 100 tablet 3  . Diclofenac Sodium 2 % SOLN Apply 1 pump twice daily. 112 g 3  . finasteride (PROSCAR) 5 MG tablet TAKE 1 TABLET EVERY DAY 90 tablet 3  . gabapentin (NEURONTIN) 100 MG capsule Take 1 capsule (100 mg total) by mouth 3 (three) times daily as needed. 270 capsule 1  . hydrOXYzine (ATARAX/VISTARIL) 25 MG tablet TAKE 1-2 TABLETS (25-50 MG TOTAL) BY MOUTH AT BEDTIME AS NEEDED FOR ITCHING. 180 tablet 0  . hyoscyamine (LEVSIN SL) 0.125 MG SL tablet Place 1 tablet (0.125 mg total) under the tongue every 4 (four) hours as needed. 30 tablet 2  . ketoconazole (NIZORAL) 2 % cream Apply topically as needed.    . meloxicam (MOBIC) 15 MG tablet Take 1 tablet (15 mg total) by mouth daily. 30 tablet 3  . mupirocin ointment (BACTROBAN) 2 % Applied twice a day to the affected area;NOT into eyes. 15 g 0  . omeprazole (PRILOSEC) 40 MG capsule Take 1 capsule (40 mg total) by mouth daily. 90 capsule 3  . permethrin (ELIMITE) 5 % cream Apply from the neck down and leave on for 12 hours. Repeat in one week. 60 g 1  . Probiotic Product (ALIGN) 4 MG CAPS Take 1 capsule (4 mg total) by mouth daily. 30 capsule 0  . tamsulosin (FLOMAX) 0.4 MG CAPS capsule Take 1 capsule (0.4 mg total) by mouth daily. 90 capsule 3  .  traMADol (ULTRAM) 50 MG tablet Take 1-2 tablets (50-100 mg total) by mouth every 6 (six) hours as needed. 60 tablet 3  . triamcinolone ointment (KENALOG) 0.5 % Apply 1 application topically 2 (two) times daily. 45 g 3  . valACYclovir (VALTREX) 1000 MG tablet Take 2t po q12h times 2 doses. Repeat with next fever blister episode. 20 tablet 3   No facility-administered medications prior to visit.     ROS Review of Systems  Constitutional: Negative for appetite change, fatigue and unexpected weight change.  HENT: Negative for congestion, nosebleeds, sneezing, sore throat and trouble swallowing.   Eyes: Negative for itching and visual disturbance.  Respiratory: Negative for cough.   Cardiovascular: Negative for chest pain, palpitations and leg swelling.  Gastrointestinal: Negative for abdominal distention, blood in stool, diarrhea and nausea.  Genitourinary: Negative for frequency and hematuria.  Musculoskeletal: Positive for arthralgias, back pain and gait problem. Negative for joint swelling and neck pain.  Skin: Negative for rash.  Neurological: Positive for weakness and numbness. Negative for dizziness, tremors and speech difficulty.  Psychiatric/Behavioral: Negative for agitation, dysphoric mood and sleep disturbance. The patient is not nervous/anxious.    R hand weakness, tingling  Objective:  BP 112/64 (BP Location: Left Arm,  Patient Position: Sitting, Cuff Size: Normal)   Pulse 64   Temp 98.4 F (36.9 C) (Oral)   Ht 5\' 10"  (1.778 m)   Wt 163 lb (73.9 kg)   SpO2 98%   BMI 23.39 kg/m   BP Readings from Last 3 Encounters:  04/03/18 112/64  03/17/18 114/80  02/17/18 118/80    Wt Readings from Last 3 Encounters:  04/03/18 163 lb (73.9 kg)  03/17/18 164 lb 9.6 oz (74.7 kg)  02/17/18 164 lb (74.4 kg)    Physical Exam  Constitutional: He is oriented to person, place, and time. He appears well-developed and well-nourished. No distress.  HENT:  Head: Normocephalic and  atraumatic.  Right Ear: External ear normal.  Left Ear: External ear normal.  Nose: Nose normal.  Mouth/Throat: Oropharynx is clear and moist. No oropharyngeal exudate.  Eyes: Pupils are equal, round, and reactive to light. Conjunctivae and EOM are normal. Right eye exhibits no discharge. Left eye exhibits no discharge. No scleral icterus.  Neck: Normal range of motion. Neck supple. No JVD present. No tracheal deviation present. No thyromegaly present.  Cardiovascular: Normal rate, regular rhythm, normal heart sounds and intact distal pulses. Exam reveals no gallop and no friction rub.  No murmur heard. Pulmonary/Chest: Effort normal and breath sounds normal. No stridor. No respiratory distress. He has no wheezes. He has no rales. He exhibits no tenderness.  Abdominal: Soft. Bowel sounds are normal. He exhibits no distension and no mass. There is no tenderness. There is no rebound and no guarding.  Genitourinary: Rectum normal, prostate normal and penis normal. Rectal exam shows guaiac negative stool. No penile tenderness.  Musculoskeletal: Normal range of motion. He exhibits tenderness. He exhibits no edema.  Lymphadenopathy:    He has no cervical adenopathy.  Neurological: He is alert and oriented to person, place, and time. He has normal reflexes. He displays normal reflexes. No cranial nerve deficit. He exhibits normal muscle tone. Coordination normal.  Skin: Skin is warm and dry. No rash noted. He is not diaphoretic. No erythema. No pallor.  Psychiatric: He has a normal mood and affect. His behavior is normal. Judgment and thought content normal.    Lab Results  Component Value Date   WBC 3.2 (L) 02/08/2018   HGB 14.8 02/08/2018   HCT 44.5 02/08/2018   PLT 244.0 02/08/2018   GLUCOSE 99 02/08/2018   CHOL 187 02/08/2018   TRIG 92.0 02/08/2018   HDL 55.50 02/08/2018   LDLDIRECT 140.3 10/26/2011   LDLCALC 113 (H) 02/08/2018   ALT 11 02/08/2018   AST 9 02/08/2018   NA 140 02/08/2018    K 4.4 02/08/2018   CL 106 02/08/2018   CREATININE 1.24 02/08/2018   BUN 17 02/08/2018   CO2 27 02/08/2018   TSH 3.94 02/08/2018   PSA 4.13 (H) 02/08/2018    Ct Abdomen Pelvis W Contrast  Result Date: 08/10/2017 CLINICAL DATA:  Worsening RIGHT lower quadrant pain over 3-4 days question appendicitis EXAM: CT ABDOMEN AND PELVIS WITH CONTRAST TECHNIQUE: Multidetector CT imaging of the abdomen and pelvis was performed using the standard protocol following bolus administration of intravenous contrast. CONTRAST:  146mL ISOVUE-300 IOPAMIDOL (ISOVUE-300) INJECTION 61% IV. Dilute oral contrast. COMPARISON:  None FINDINGS: Lower chest: Lung bases clear Hepatobiliary: Tiny hypervascular nodule posterior RIGHT lobe liver 7 mm diameter question atypical hemangioma. Additional tiny hepatic cysts. Gallbladder and liver otherwise normal appearance. Pancreas: Normal appearance Spleen: Normal appearance Adrenals/Urinary Tract: Adrenal glands normal appearance. Nonobstructing 5 mm LEFT renal calculus  image 28. Small LEFT renal cysts. No hydronephrosis or hydroureter. Bladder contains only a small amount of urine, incompletely distended, limiting assessment of wall thickness. Stomach/Bowel: Normal appendix. Scattered descending colonic diverticulosis. Retained stool in colon limits assessment for masses. Questionable small cecal nodule versus stool artifact 11 mm diameter, having a different appearance from stool throughout remainder of colon. Stomach and bowel loops otherwise normal appearance. Vascular/Lymphatic: Aorta normal caliber without aneurysm. No definite adenopathy. Reproductive: Prostatic enlargement 5.5 x 4.3 x 4.1 cm. Seminal vesicles unremarkable. Other: No free air or free fluid. No inflammatory process or definite hernia. Musculoskeletal: Unremarkable IMPRESSION: Normal appendix. Small cysts and nonobstructing calculus in LEFT kidney. Scattered colonic diverticulosis without definite evidence of  diverticulitis. Cannot exclude 11 mm diameter nodule/polyp at cecum ; followup non emergent colonoscopy recommended to exclude colonic polyp. Prostatic enlargement. Electronically Signed   By: Lavonia Dana M.D.   On: 08/10/2017 16:33    Assessment & Plan:   There are no diagnoses linked to this encounter. I am having Jorge Turner maintain his cholecalciferol, Vitamin B-12, ketoconazole, mupirocin ointment, gabapentin, ANUSOL-HC, valACYclovir, traMADol, triamcinolone ointment, hydrOXYzine, omeprazole, BREO ELLIPTA, hyoscyamine, Diclofenac Sodium, ALIGN, meloxicam, finasteride, tamsulosin, permethrin, and clobetasol ointment.  No orders of the defined types were placed in this encounter.    Follow-up: No follow-ups on file.  Walker Kehr, MD

## 2018-04-03 NOTE — Assessment & Plan Note (Signed)
On B12 

## 2018-04-03 NOTE — Assessment & Plan Note (Signed)
Chronic PSA is better On Finesteride

## 2018-04-03 NOTE — Assessment & Plan Note (Signed)
Recurrent MSK/OA

## 2018-04-03 NOTE — Assessment & Plan Note (Signed)
Elocon lotion

## 2018-04-04 ENCOUNTER — Other Ambulatory Visit: Payer: Self-pay

## 2018-04-04 ENCOUNTER — Encounter (HOSPITAL_BASED_OUTPATIENT_CLINIC_OR_DEPARTMENT_OTHER): Payer: Self-pay | Admitting: *Deleted

## 2018-04-04 DIAGNOSIS — M542 Cervicalgia: Secondary | ICD-10-CM | POA: Diagnosis not present

## 2018-04-04 DIAGNOSIS — G5602 Carpal tunnel syndrome, left upper limb: Secondary | ICD-10-CM | POA: Diagnosis not present

## 2018-04-04 DIAGNOSIS — M545 Low back pain: Secondary | ICD-10-CM | POA: Diagnosis not present

## 2018-04-10 NOTE — Anesthesia Preprocedure Evaluation (Addendum)
Anesthesia Evaluation  Patient identified by MRN, date of birth, ID band Patient awake    Reviewed: Allergy & Precautions, H&P , NPO status , Patient's Chart, lab work & pertinent test results  Airway Mallampati: II  TM Distance: >3 FB Neck ROM: Full    Dental no notable dental hx.    Pulmonary neg pulmonary ROS,    Pulmonary exam normal breath sounds clear to auscultation       Cardiovascular negative cardio ROS Normal cardiovascular exam Rhythm:Regular Rate:Normal     Neuro/Psych  Headaches,  Neuromuscular disease negative neurological ROS  negative psych ROS   GI/Hepatic Neg liver ROS, GERD  Medicated,  Endo/Other  negative endocrine ROS  Renal/GU negative Renal ROS  negative genitourinary   Musculoskeletal  (+) Arthritis , Osteoarthritis,    Abdominal   Peds negative pediatric ROS (+)  Hematology negative hematology ROS (+)   Anesthesia Other Findings   Reproductive/Obstetrics negative OB ROS                             Anesthesia Physical  Anesthesia Plan  ASA: III  Anesthesia Plan: General   Post-op Pain Management:    Induction: Intravenous  PONV Risk Score and Plan: 1  Airway Management Planned: LMA  Additional Equipment:   Intra-op Plan:   Post-operative Plan: Extubation in OR  Informed Consent: I have reviewed the patients History and Physical, chart, labs and discussed the procedure including the risks, benefits and alternatives for the proposed anesthesia with the patient or authorized representative who has indicated his/her understanding and acceptance.     Plan Discussed with: CRNA, Surgeon and Anesthesiologist  Anesthesia Plan Comments: ( )       Anesthesia Quick Evaluation

## 2018-04-11 ENCOUNTER — Ambulatory Visit (HOSPITAL_BASED_OUTPATIENT_CLINIC_OR_DEPARTMENT_OTHER): Payer: Federal, State, Local not specified - PPO | Admitting: Anesthesiology

## 2018-04-11 ENCOUNTER — Other Ambulatory Visit: Payer: Self-pay

## 2018-04-11 ENCOUNTER — Encounter (HOSPITAL_BASED_OUTPATIENT_CLINIC_OR_DEPARTMENT_OTHER): Payer: Self-pay

## 2018-04-11 ENCOUNTER — Encounter (HOSPITAL_BASED_OUTPATIENT_CLINIC_OR_DEPARTMENT_OTHER)
Admission: RE | Disposition: A | Discharge status: Home / Self Care | Payer: Self-pay | Source: Ambulatory Visit | Attending: Specialist

## 2018-04-11 ENCOUNTER — Ambulatory Visit (HOSPITAL_BASED_OUTPATIENT_CLINIC_OR_DEPARTMENT_OTHER)
Admission: RE | Admit: 2018-04-11 | Discharge: 2018-04-11 | Disposition: A | Discharge status: Home / Self Care | Payer: Federal, State, Local not specified - PPO | Source: Ambulatory Visit | Attending: Specialist | Admitting: Specialist

## 2018-04-11 ENCOUNTER — Telehealth (INDEPENDENT_AMBULATORY_CARE_PROVIDER_SITE_OTHER): Payer: Self-pay | Admitting: Specialist

## 2018-04-11 DIAGNOSIS — Z881 Allergy status to other antibiotic agents status: Secondary | ICD-10-CM | POA: Diagnosis not present

## 2018-04-11 DIAGNOSIS — K921 Melena: Secondary | ICD-10-CM | POA: Diagnosis not present

## 2018-04-11 DIAGNOSIS — Z79899 Other long term (current) drug therapy: Secondary | ICD-10-CM | POA: Insufficient documentation

## 2018-04-11 DIAGNOSIS — E538 Deficiency of other specified B group vitamins: Secondary | ICD-10-CM | POA: Diagnosis not present

## 2018-04-11 DIAGNOSIS — Z87892 Personal history of anaphylaxis: Secondary | ICD-10-CM | POA: Diagnosis not present

## 2018-04-11 DIAGNOSIS — N400 Enlarged prostate without lower urinary tract symptoms: Secondary | ICD-10-CM | POA: Insufficient documentation

## 2018-04-11 DIAGNOSIS — N342 Other urethritis: Secondary | ICD-10-CM | POA: Diagnosis not present

## 2018-04-11 DIAGNOSIS — K219 Gastro-esophageal reflux disease without esophagitis: Secondary | ICD-10-CM | POA: Insufficient documentation

## 2018-04-11 DIAGNOSIS — Z8249 Family history of ischemic heart disease and other diseases of the circulatory system: Secondary | ICD-10-CM | POA: Insufficient documentation

## 2018-04-11 DIAGNOSIS — G5601 Carpal tunnel syndrome, right upper limb: Secondary | ICD-10-CM

## 2018-04-11 DIAGNOSIS — L309 Dermatitis, unspecified: Secondary | ICD-10-CM | POA: Diagnosis not present

## 2018-04-11 HISTORY — PX: CARPAL TUNNEL RELEASE: SHX101

## 2018-04-11 SURGERY — CARPAL TUNNEL RELEASE
Anesthesia: General | Laterality: Right

## 2018-04-11 MED ORDER — ACETAMINOPHEN 160 MG/5ML PO SOLN: 325.0000 mg | ORAL | Status: DC | PRN

## 2018-04-11 MED ORDER — HYDROCODONE-IBUPROFEN 7.5-200 MG PO TABS: 1.0000 | ORAL_TABLET | ORAL | 0 refills | Status: AC | PRN

## 2018-04-11 MED ORDER — BUPIVACAINE HCL (PF) 0.5 % IJ SOLN
INTRAMUSCULAR | Status: AC
  Filled 2018-04-11: qty 30

## 2018-04-11 MED ORDER — LIDOCAINE HCL (CARDIAC) PF 100 MG/5ML IV SOSY
PREFILLED_SYRINGE | INTRAVENOUS | Status: AC
  Filled 2018-04-11: qty 5

## 2018-04-11 MED ORDER — MIDAZOLAM HCL 5 MG/5ML IJ SOLN
INTRAMUSCULAR | Status: DC | PRN
  Administered 2018-04-11: 2 mg via INTRAVENOUS

## 2018-04-11 MED ORDER — FENTANYL CITRATE (PF) 100 MCG/2ML IJ SOLN
INTRAMUSCULAR | Status: DC | PRN
  Administered 2018-04-11 (×2): 50 ug via INTRAVENOUS

## 2018-04-11 MED ORDER — LIDOCAINE HCL (PF) 1 % IJ SOLN
INTRAMUSCULAR | Status: AC
  Filled 2018-04-11: qty 30

## 2018-04-11 MED ORDER — LACTATED RINGERS IV SOLN
INTRAVENOUS | Status: DC
  Administered 2018-04-11: 07:00:00 via INTRAVENOUS

## 2018-04-11 MED ORDER — OXYCODONE HCL 5 MG PO TABS: 5.0000 mg | ORAL_TABLET | Freq: Once | ORAL | Status: DC | PRN

## 2018-04-11 MED ORDER — CEFAZOLIN SODIUM-DEXTROSE 2-4 GM/100ML-% IV SOLN
INTRAVENOUS | Status: AC
  Filled 2018-04-11: qty 100

## 2018-04-11 MED ORDER — CHLORHEXIDINE GLUCONATE 4 % EX LIQD: 60.0000 mL | Freq: Once | CUTANEOUS | Status: DC

## 2018-04-11 MED ORDER — OXYCODONE HCL 5 MG/5ML PO SOLN: 5.0000 mg | Freq: Once | ORAL | Status: DC | PRN

## 2018-04-11 MED ORDER — DEXAMETHASONE SODIUM PHOSPHATE 10 MG/ML IJ SOLN
INTRAMUSCULAR | Status: DC | PRN
  Administered 2018-04-11: 10 mg via INTRAVENOUS

## 2018-04-11 MED ORDER — LIDOCAINE 2% (20 MG/ML) 5 ML SYRINGE
INTRAMUSCULAR | Status: DC | PRN
  Administered 2018-04-11: 80 mg via INTRAVENOUS

## 2018-04-11 MED ORDER — BUPIVACAINE HCL (PF) 0.5 % IJ SOLN
INTRAMUSCULAR | Status: DC | PRN
  Administered 2018-04-11: 7 mL

## 2018-04-11 MED ORDER — PROPOFOL 10 MG/ML IV BOLUS
INTRAVENOUS | Status: DC | PRN
  Administered 2018-04-11: 200 mg via INTRAVENOUS

## 2018-04-11 MED ORDER — PROPOFOL 500 MG/50ML IV EMUL
INTRAVENOUS | Status: AC
  Filled 2018-04-11: qty 50

## 2018-04-11 MED ORDER — MIDAZOLAM HCL 2 MG/2ML IJ SOLN
INTRAMUSCULAR | Status: AC
  Filled 2018-04-11: qty 2

## 2018-04-11 MED ORDER — FENTANYL CITRATE (PF) 100 MCG/2ML IJ SOLN: 25.0000 ug | INTRAMUSCULAR | Status: DC | PRN

## 2018-04-11 MED ORDER — ONDANSETRON HCL 4 MG/2ML IJ SOLN
INTRAMUSCULAR | Status: AC
  Filled 2018-04-11: qty 2

## 2018-04-11 MED ORDER — FENTANYL CITRATE (PF) 100 MCG/2ML IJ SOLN: 50.0000 ug | INTRAMUSCULAR | Status: DC | PRN

## 2018-04-11 MED ORDER — FENTANYL CITRATE (PF) 100 MCG/2ML IJ SOLN
INTRAMUSCULAR | Status: AC
  Filled 2018-04-11: qty 2

## 2018-04-11 MED ORDER — SCOPOLAMINE 1 MG/3DAYS TD PT72: 1.0000 | MEDICATED_PATCH | Freq: Once | TRANSDERMAL | Status: DC | PRN

## 2018-04-11 MED ORDER — MIDAZOLAM HCL 2 MG/2ML IJ SOLN: 1.0000 mg | INTRAMUSCULAR | Status: DC | PRN

## 2018-04-11 MED ORDER — MEPERIDINE HCL 25 MG/ML IJ SOLN: 6.2500 mg | INTRAMUSCULAR | Status: DC | PRN

## 2018-04-11 MED ORDER — LACTATED RINGERS IV SOLN: 100.0000 mL/h | INTRAVENOUS | Status: DC

## 2018-04-11 MED ORDER — ONDANSETRON HCL 4 MG/2ML IJ SOLN
4.0000 mg | Freq: Once | INTRAMUSCULAR | Status: DC | PRN
Start: 2018-04-11 — End: 2018-04-11

## 2018-04-11 MED ORDER — ONDANSETRON HCL 4 MG/2ML IJ SOLN
INTRAMUSCULAR | Status: DC | PRN
  Administered 2018-04-11: 4 mg via INTRAVENOUS

## 2018-04-11 MED ORDER — DEXAMETHASONE SODIUM PHOSPHATE 10 MG/ML IJ SOLN
INTRAMUSCULAR | Status: AC
  Filled 2018-04-11: qty 1

## 2018-04-11 MED ORDER — SOD CITRATE-CITRIC ACID 500-334 MG/5ML PO SOLN: 30.0000 mL | Freq: Once | ORAL | Status: DC

## 2018-04-11 MED ORDER — ACETAMINOPHEN 325 MG PO TABS: 325.0000 mg | ORAL_TABLET | ORAL | Status: DC | PRN

## 2018-04-11 MED ORDER — CEFAZOLIN SODIUM-DEXTROSE 2-4 GM/100ML-% IV SOLN
2.0000 g | INTRAVENOUS | Status: AC
  Administered 2018-04-11: 2 g via INTRAVENOUS

## 2018-04-11 SURGICAL SUPPLY — 52 items
ADH SKN CLS APL DERMABOND .7 (GAUZE/BANDAGES/DRESSINGS) ×1
BANDAGE ACE 3X5.8 VEL STRL LF (GAUZE/BANDAGES/DRESSINGS) ×2 IMPLANT
BANDAGE ACE 4X5 VEL STRL LF (GAUZE/BANDAGES/DRESSINGS) ×2 IMPLANT
BENZOIN TINCTURE PRP APPL 2/3 (GAUZE/BANDAGES/DRESSINGS) IMPLANT
BLADE SURG 15 STRL LF DISP TIS (BLADE) ×1 IMPLANT
BLADE SURG 15 STRL SS (BLADE) ×1
BNDG CMPR 9X4 STRL LF SNTH (GAUZE/BANDAGES/DRESSINGS) ×1
BNDG ESMARK 4X9 LF (GAUZE/BANDAGES/DRESSINGS) ×2 IMPLANT
BNDG GAUZE ELAST 4 BULKY (GAUZE/BANDAGES/DRESSINGS) ×2 IMPLANT
CORD BIPOLAR FORCEPS 12FT (ELECTRODE) ×2 IMPLANT
COVER BACK TABLE 60X90IN (DRAPES) ×2 IMPLANT
COVER MAYO STAND STRL (DRAPES) ×2 IMPLANT
CUFF TOURNIQUET SINGLE 18IN (TOURNIQUET CUFF) ×2 IMPLANT
DERMABOND ADVANCED (GAUZE/BANDAGES/DRESSINGS) ×1
DERMABOND ADVANCED .7 DNX12 (GAUZE/BANDAGES/DRESSINGS) ×1 IMPLANT
DRAPE EXTREMITY T 121X128X90 (DRAPE) ×2 IMPLANT
DRAPE SURG 17X23 STRL (DRAPES) ×2 IMPLANT
DRSG EMULSION OIL 3X3 NADH (GAUZE/BANDAGES/DRESSINGS) ×2 IMPLANT
DRSG PAD ABDOMINAL 8X10 ST (GAUZE/BANDAGES/DRESSINGS) IMPLANT
DURAPREP 26ML APPLICATOR (WOUND CARE) ×2 IMPLANT
ELECT REM PT RETURN 9FT ADLT (ELECTROSURGICAL)
ELECTRODE REM PT RTRN 9FT ADLT (ELECTROSURGICAL) IMPLANT
GAUZE SPONGE 4X4 12PLY STRL (GAUZE/BANDAGES/DRESSINGS) ×2 IMPLANT
GAUZE SPONGE 4X4 12PLY STRL LF (GAUZE/BANDAGES/DRESSINGS) IMPLANT
GLOVE BIOGEL PI IND STRL 8 (GLOVE) ×1 IMPLANT
GLOVE BIOGEL PI IND STRL 9 (GLOVE) ×1 IMPLANT
GLOVE BIOGEL PI INDICATOR 8 (GLOVE) ×1
GLOVE BIOGEL PI INDICATOR 9 (GLOVE) ×1
GLOVE ORTHO TXT STRL SZ7.5 (GLOVE) ×2 IMPLANT
GOWN STRL REUS W/ TWL LRG LVL3 (GOWN DISPOSABLE) ×1 IMPLANT
GOWN STRL REUS W/TWL 2XL LVL3 (GOWN DISPOSABLE) ×2 IMPLANT
GOWN STRL REUS W/TWL LRG LVL3 (GOWN DISPOSABLE) ×1
NEEDLE HYPO 22GX1.5 SAFETY (NEEDLE) IMPLANT
PACK BASIN DAY SURGERY FS (CUSTOM PROCEDURE TRAY) ×2 IMPLANT
PAD CAST 3X4 CTTN HI CHSV (CAST SUPPLIES) ×1 IMPLANT
PAD CAST 4YDX4 CTTN HI CHSV (CAST SUPPLIES) ×1 IMPLANT
PADDING CAST ABS 4INX4YD NS (CAST SUPPLIES) ×1
PADDING CAST ABS COTTON 4X4 ST (CAST SUPPLIES) ×1 IMPLANT
PADDING CAST COTTON 3X4 STRL (CAST SUPPLIES) ×1
PADDING CAST COTTON 4X4 STRL (CAST SUPPLIES) ×1
PENCIL BUTTON HOLSTER BLD 10FT (ELECTRODE) IMPLANT
RUBBERBAND STERILE (MISCELLANEOUS) IMPLANT
SPLINT FIBERGLASS 3X35 (CAST SUPPLIES) IMPLANT
SPLINT PLASTER CAST XFAST 3X15 (CAST SUPPLIES) IMPLANT
SPLINT PLASTER XTRA FASTSET 3X (CAST SUPPLIES)
STOCKINETTE 4X48 STRL (DRAPES) ×2 IMPLANT
STRIP CLOSURE SKIN 1/2X4 (GAUZE/BANDAGES/DRESSINGS) IMPLANT
SUT ETHILON 4 0 PS 2 18 (SUTURE) ×2 IMPLANT
SUT VIC AB 3-0 FS2 27 (SUTURE) IMPLANT
SYR BULB 3OZ (MISCELLANEOUS) IMPLANT
SYR CONTROL 10ML LL (SYRINGE) IMPLANT
TOWEL OR 17X24 6PK STRL BLUE (TOWEL DISPOSABLE) ×2 IMPLANT

## 2018-04-11 NOTE — Anesthesia Procedure Notes (Signed)
Procedure Name: LMA Insertion Date/Time: 04/11/2018 7:41 AM Performed by: Genelle Bal, CRNA Pre-anesthesia Checklist: Patient identified, Emergency Drugs available, Suction available and Patient being monitored Patient Re-evaluated:Patient Re-evaluated prior to induction Oxygen Delivery Method: Circle system utilized Preoxygenation: Pre-oxygenation with 100% oxygen Induction Type: IV induction Ventilation: Mask ventilation without difficulty LMA: LMA inserted LMA Size: 4.0 Number of attempts: 1 Airway Equipment and Method: Bite block Placement Confirmation: positive ETCO2 Tube secured with: Tape Dental Injury: Teeth and Oropharynx as per pre-operative assessment

## 2018-04-11 NOTE — Op Note (Signed)
04/11/2018  8:12 AM  PATIENT:  Jorge Turner  62 y.o. male  MRN: [id number]  OPERATIVE REPORT   PRE-OPERATIVE DIAGNOSIS:  right carpal tunnel syndrome  POST-OPERATIVE DIAGNOSIS:  right carpal tunnel syndrome  PROCEDURE:  Procedure(s): RIGHT OPEN CARPAL TUNNEL RELEASE    SURGEON:  Jessy Oto, MD      ASSISTANT:  Benjiman Core, PA-C  (Present throughout the entire procedure and necessary for completion of procedure in a timely manner)      ANESTHESIA: General and local, Dr. Dion Body right forearm Level, Supplemented with local Marcaine 0.5 % 8 cc     COMPLICATIONS:  None.      TOURNIQUET TIME: 7 minutes at  250 mmHg   PROCEDURE: The patient was met in the holding area, and the appropriate right wrist identified and marked.The patient was then transported to OR and was placed on the operative table in a supine position. The patient was then placed under General anesthesia without difficulty. The patient received appropriate preoperative antibiotic prophylaxis.     The right upper extremity was then prepped using sterile conditions and draped using sterile technique.  Time-out procedure was called and correct.  The right arm elevated and exsanguinated with an Esmarch bandage and tourniquet inflated to 250 mm Hg. The skin and distal volar forearm and proximal volar palm infiltrated with 0.5% marcain plain total of 8 cc.  Using loope magnification and head lamp a 1.5 inch incision curved at the wrist crease with 15 blade scalpel.  Incision through skin and subcutaneous tissue to the volar forearm fascia and  transverse carpal ligament. Fascia then carefully lifed and incised with Stevens scissors inline with the right fourth digit. The skin and subcutaneous tissue retracted and the volar fascia divided under direct vision from distal to proximal. A freer elevator then carefully placed between the median nerve and the transverse carpal ligament protecting the  median nerve as the  transverse carpal ligament was divided with a 15 blade scalpel in line with the fourth digit. Retracting the distal skin and subcutaneous tissues distally under direct visualization the remaining portions of the transverse carpal ligament were divided with iridectomy scissors again in line with the fourth digit. The palmar fascia was then divided until the traversing superficial palmar arch was identified and preserved intact.  The motor branch of the median nerve was carefully examined and identified intact. Tourniquet was then released. Bleeding controlled with bipolar electrocautery. The incision was then irrigated with copious amounts of irrigant solution, No active bleeding was present. The incision closed with a single layer skin closure of 4-0 nylon horizontal mattress sutures. Dry dressing of adaptic, 4x4s held in place with sterile webril.  A well padded volar splint applied with ace wrap.  The patient reactivated and returned to the PACU in good condition.  All instruments and sponge counts were correct.          Basil Dess  04/11/2018, 8:12 AM

## 2018-04-11 NOTE — H&P (Signed)
Jorge Turner is an 62 y.o. male.   Chief Complaint: Right hand pain numbness tingling HPI: Patient with history of right carpal tunnel syndrome and the above complaint presents for surgical intervention.  Worsening symptoms.  Failed conservative treatment.  Past Medical History:  Diagnosis Date  . Balanitis   . BPH (benign prostatic hypertrophy)   . DJD (degenerative joint disease) of knee   . Eczema   . GERD (gastroesophageal reflux disease)   . Hemorrhoid   . Low back pain   . Medial meniscus tear    left  . Patellofemoral arthralgia of left knee   . Personal history of colonic polyps 03/03/2010  . Vitamin B12 deficiency     Past Surgical History:  Procedure Laterality Date  . CIRCUMCISION N/A 07/20/2013   Procedure: CIRCUMCISION ADULT;  Surgeon: Claybon Jabs, MD;  Location: [facility];  Service: Urology;  Laterality: N/A;  . COLONOSCOPY    . CYSTO REMOVAL RIGHT THUMB  2006  . RIGHT HYDROCELECTOMY  11-13-2010    Family History  Problem Relation Age of Onset  . Hypertension Mother   . Diabetes Mother   . Hypertension Father   . Diabetes Father   . Colon cancer Neg Hx   . Rectal cancer Neg Hx   . Stomach cancer Neg Hx    Social History:  reports that he has never smoked. He has never used smokeless tobacco. He reports that he does not drink alcohol or use drugs.  Allergies:  Allergies  Allergen Reactions  . Erythromycin Ethylsuccinate Anaphylaxis  . Doxycycline Nausea And Vomiting    Medications Prior to Admission  Medication Sig Dispense Refill  . cholecalciferol (VITAMIN D) 1000 UNITS tablet Take 1,000 Units by mouth daily.      . Cyanocobalamin (VITAMIN B-12) 1000 MCG SUBL Place 1 tablet (1,000 mcg total) under the tongue daily. 100 tablet 3  . finasteride (PROSCAR) 5 MG tablet TAKE 1 TABLET EVERY DAY 90 tablet 3  . gabapentin (NEURONTIN) 100 MG capsule Take 1 capsule (100 mg total) by mouth 3 (three) times daily as needed. 270 capsule 1  .  hydrocortisone (ANUSOL-HC) 2.5 % rectal cream APPLY TO AFFECTED AREA TWICE A DAY AS NEEDED 30 g 2  . hydrOXYzine (ATARAX/VISTARIL) 25 MG tablet TAKE 1-2 TABLETS (25-50 MG TOTAL) BY MOUTH AT BEDTIME AS NEEDED FOR ITCHING. 180 tablet 0  . mometasone (ELOCON) 0.1 % lotion Apply topically daily. 60 mL 2  . omeprazole (PRILOSEC) 40 MG capsule Take 1 capsule (40 mg total) by mouth daily. 90 capsule 3  . Probiotic Product (ALIGN) 4 MG CAPS Take 1 capsule (4 mg total) by mouth daily. 30 capsule 5  . tamsulosin (FLOMAX) 0.4 MG CAPS capsule Take 1 capsule (0.4 mg total) by mouth daily. 90 capsule 3  . clobetasol ointment (TEMOVATE) 1.82 % Apply 1 application topically 2 (two) times daily. 30 g 0  . valACYclovir (VALTREX) 1000 MG tablet Take 2t po q12h times 2 doses. Repeat with next fever blister episode. 20 tablet 3    No results found for this or any previous visit (from the past 48 hour(s)). No results found.  Review of Systems  Constitutional: Negative.   HENT: Negative.   Eyes: Negative.   Respiratory: Negative.   Cardiovascular: Negative.   Gastrointestinal: Negative.   Genitourinary: Negative.   Musculoskeletal:       Right hand pain  Skin: Negative.   Neurological: Positive for tingling (right hand).  Psychiatric/Behavioral: Negative.  Blood pressure (!) 127/92, pulse 62, temperature 98.1 F (36.7 C), temperature source Oral, resp. rate 18, height 5\' 10"  (1.778 m), weight 163 lb (73.9 kg), SpO2 99 %. Physical Exam  Constitutional: He is oriented to person, place, and time. He appears well-developed. No distress.  HENT:  Head: Normocephalic and atraumatic.  Eyes: Pupils are equal, round, and reactive to light. EOM are normal.  Neck: Normal range of motion.  Cardiovascular: Normal rate and normal heart sounds.  Respiratory: Effort normal and breath sounds normal. No respiratory distress. He has no wheezes.  GI: Soft. He exhibits no distension. There is no tenderness.   Musculoskeletal:  Positive tinel's and phalen's.  Neurological: He is alert and oriented to person, place, and time.  Skin: Skin is warm and dry.  Psychiatric: He has a normal mood and affect.     Assessment/Plan Right carpal tunnel syndrome  We will proceed with right carpal tunnel release as scheduled.  Surgical procedure along with possible rehab/recovery time discussed.  All questions answered.  Patient wishes to proceed.  Benjiman Core, PA-C 04/11/2018, 7:23 AM

## 2018-04-11 NOTE — Telephone Encounter (Signed)
Pt checking on the status of paperwork

## 2018-04-11 NOTE — Transfer of Care (Signed)
Immediate Anesthesia Transfer of Care Note  Patient: Jorge Turner  Procedure(s) Performed: RIGHT OPEN CARPAL TUNNEL RELEASE (Right )  Patient Location: PACU  Anesthesia Type:General  Level of Consciousness: drowsy and patient cooperative  Airway & Oxygen Therapy: Patient Spontanous Breathing and Patient connected to face mask oxygen  Post-op Assessment: Report given to RN and Post -op Vital signs reviewed and stable  Post vital signs: Reviewed and stable  Last Vitals:  Vitals Value Taken Time  BP    Temp    Pulse    Resp    SpO2      Last Pain:  Vitals:   04/11/18 0647  TempSrc: Oral  PainSc: 6       Patients Stated Pain Goal: 2 (99/35/70 1779)  Complications: No apparent anesthesia complications

## 2018-04-11 NOTE — Discharge Instructions (Addendum)
° ° °  Keep dressing dry. Elevated wrist above heart. Apply ice to palm side of wrist two hours on and one half hour off for 48 hours. May Apply ice at night and go to sleep with out changing. Be sure to keep ice off fingers to prevent frost bite.  Return to office in two weeks for removal of sutures left wrist.    Postoperative Anesthesia Instructions-Pediatric  Activity: Your child should rest for the remainder of the day. A responsible individual must stay with your child for 24 hours.  Meals: Your child should start with liquids and light foods such as gelatin or soup unless otherwise instructed by the physician. Progress to regular foods as tolerated. Avoid spicy, greasy, and heavy foods. If nausea and/or vomiting occur, drink only clear liquids such as apple juice or Pedialyte until the nausea and/or vomiting subsides. Call your physician if vomiting continues.  Special Instructions/Symptoms: Your child may be drowsy for the rest of the day, although some children experience some hyperactivity a few hours after the surgery. Your child may also experience some irritability or crying episodes due to the operative procedure and/or anesthesia. Your child's throat may feel dry or sore from the anesthesia or the breathing tube placed in the throat during surgery. Use throat lozenges, sprays, or ice chips if needed.

## 2018-04-11 NOTE — Telephone Encounter (Addendum)
Surgery 04/11/18 Carpal tunnel syndrome,right upper limb    Pt will need a work note stating if he has any restrictions as well as his return to work date

## 2018-04-11 NOTE — Interval H&P Note (Signed)
History and Physical Interval Note:  04/11/2018 7:33 AM  Jorge Turner  has presented today for surgery, with the diagnosis of right carpal tunnel syndrome  The various methods of treatment have been discussed with the patient and family. After consideration of risks, benefits and other options for treatment, the patient has consented to  Procedure(s): RIGHT OPEN CARPAL TUNNEL RELEASE (Right) as a surgical intervention .  The patient's history has been reviewed, patient examined, no change in status, stable for surgery.  I have reviewed the patient's chart and labs.  Questions were answered to the patient's satisfaction.     Basil Dess

## 2018-04-11 NOTE — Anesthesia Postprocedure Evaluation (Signed)
Anesthesia Post Note  Patient: [place]  Procedure(s) Performed: RIGHT OPEN CARPAL TUNNEL RELEASE (Right )     Patient location during evaluation: PACU Anesthesia Type: General Level of consciousness: awake and alert Pain management: pain level controlled Vital Signs Assessment: post-procedure vital signs reviewed and stable Respiratory status: spontaneous breathing, nonlabored ventilation, respiratory function stable and patient connected to nasal cannula oxygen Cardiovascular status: blood pressure returned to baseline and stable Postop Assessment: no apparent nausea or vomiting Anesthetic complications: no    Last Vitals:  Vitals:   04/11/18 0900 04/11/18 0918  BP: 122/87 (!) 137/98  Pulse: 62 62  Resp: 18 18  Temp:  36.5 C  SpO2: 97% 98%    Last Pain:  Vitals:   04/11/18 0918  TempSrc:   PainSc: 0-No pain                 Tres Grzywacz

## 2018-04-11 NOTE — Telephone Encounter (Signed)
Pt will need a work note stating if he has any restrictions as well as his return to work date

## 2018-04-11 NOTE — Brief Op Note (Signed)
04/11/2018  8:10 AM  PATIENT:  Jorge Turner  62 y.o. male  PRE-OPERATIVE DIAGNOSIS:  right carpal tunnel syndrome  POST-OPERATIVE DIAGNOSIS:  right carpal tunnel syndrome  PROCEDURE:  Procedure(s): RIGHT OPEN CARPAL TUNNEL RELEASE (Right)  SURGEON:  Surgeon(s) and Role:    * Jessy Oto, MD - Primary  PHYSICIAN ASSISTANT:Nakia Koble Ricard Dillon, PA-C }   ANESTHESIA:   local and general, Dr. Ambrose Pancoast  EBL:  1 mL   BLOOD ADMINISTERED:none  DRAINS: none   LOCAL MEDICATIONS USED:  MARCAINE 0.5% plain Amount: 8 ml  SPECIMEN:  No Specimen  DISPOSITION OF SPECIMEN:  N/A  COUNTS:  YES  TOURNIQUET:   Total Tourniquet Time Documented: Forearm (laterality) - 7 minutes Total: Forearm (laterality) - 7 minutes   DICTATION: .Dragon Dictation  PLAN OF CARE: Discharge to home after PACU  PATIENT DISPOSITION:  PACU - hemodynamically stable.   Delay start of Pharmacological VTE agent (>24hrs) due to surgical blood loss or risk of bleeding: yes

## 2018-04-12 ENCOUNTER — Encounter (HOSPITAL_BASED_OUTPATIENT_CLINIC_OR_DEPARTMENT_OTHER): Payer: Self-pay | Admitting: Specialist

## 2018-04-13 ENCOUNTER — Encounter (INDEPENDENT_AMBULATORY_CARE_PROVIDER_SITE_OTHER): Payer: Self-pay | Admitting: Specialist

## 2018-04-13 NOTE — Telephone Encounter (Signed)
Pt called checking on the status of work note and paperwork. Pt also would like to discuss his care about surgery

## 2018-04-13 NOTE — Telephone Encounter (Signed)
I called Jorge Turner and spoke with him about getting his paperwork for the [facility] done and I plan to accomplish this by Monday. He also needs a work note for the recent carpal tunnel release surgery done 5/13. I printed a note having him out of work for 6 weeks to return to work on 05/22/2018. The note in on your desk and he can pick up tomorrow or next week with his other paperwork.

## 2018-04-14 NOTE — Telephone Encounter (Signed)
Pt is aware letter is ready for pick up at the front desk

## 2018-04-20 ENCOUNTER — Other Ambulatory Visit: Payer: Self-pay | Admitting: Internal Medicine

## 2018-04-21 ENCOUNTER — Telehealth (INDEPENDENT_AMBULATORY_CARE_PROVIDER_SITE_OTHER): Payer: Self-pay | Admitting: Specialist

## 2018-04-21 NOTE — Telephone Encounter (Signed)
Patient called asked for a call back concerning his FMLA paperwork. The number to contact patient is [phone]

## 2018-04-25 DIAGNOSIS — M545 Low back pain: Secondary | ICD-10-CM | POA: Diagnosis not present

## 2018-04-25 DIAGNOSIS — M542 Cervicalgia: Secondary | ICD-10-CM | POA: Diagnosis not present

## 2018-04-25 DIAGNOSIS — G5602 Carpal tunnel syndrome, left upper limb: Secondary | ICD-10-CM | POA: Diagnosis not present

## 2018-04-27 ENCOUNTER — Ambulatory Visit (INDEPENDENT_AMBULATORY_CARE_PROVIDER_SITE_OTHER): Payer: Federal, State, Local not specified - PPO | Admitting: Surgery

## 2018-04-27 ENCOUNTER — Encounter (INDEPENDENT_AMBULATORY_CARE_PROVIDER_SITE_OTHER): Payer: Self-pay | Admitting: Surgery

## 2018-04-27 DIAGNOSIS — H903 Sensorineural hearing loss, bilateral: Secondary | ICD-10-CM | POA: Diagnosis not present

## 2018-04-27 DIAGNOSIS — Z9889 Other specified postprocedural states: Secondary | ICD-10-CM

## 2018-04-28 DIAGNOSIS — G5602 Carpal tunnel syndrome, left upper limb: Secondary | ICD-10-CM | POA: Diagnosis not present

## 2018-04-28 DIAGNOSIS — M545 Low back pain: Secondary | ICD-10-CM | POA: Diagnosis not present

## 2018-04-28 DIAGNOSIS — M542 Cervicalgia: Secondary | ICD-10-CM | POA: Diagnosis not present

## 2018-04-28 NOTE — Telephone Encounter (Signed)
I called and LMOM that his forms are ready for pick up at the front desk.

## 2018-05-01 DIAGNOSIS — R1312 Dysphagia, oropharyngeal phase: Secondary | ICD-10-CM | POA: Diagnosis not present

## 2018-05-01 DIAGNOSIS — B370 Candidal stomatitis: Secondary | ICD-10-CM | POA: Diagnosis not present

## 2018-05-01 DIAGNOSIS — K210 Gastro-esophageal reflux disease with esophagitis: Secondary | ICD-10-CM | POA: Diagnosis not present

## 2018-05-01 DIAGNOSIS — J410 Simple chronic bronchitis: Secondary | ICD-10-CM | POA: Diagnosis not present

## 2018-05-01 DIAGNOSIS — J040 Acute laryngitis: Secondary | ICD-10-CM | POA: Diagnosis not present

## 2018-05-02 DIAGNOSIS — G5602 Carpal tunnel syndrome, left upper limb: Secondary | ICD-10-CM | POA: Diagnosis not present

## 2018-05-02 DIAGNOSIS — M542 Cervicalgia: Secondary | ICD-10-CM | POA: Diagnosis not present

## 2018-05-02 DIAGNOSIS — M545 Low back pain: Secondary | ICD-10-CM | POA: Diagnosis not present

## 2018-05-04 ENCOUNTER — Ambulatory Visit: Payer: Federal, State, Local not specified - PPO

## 2018-05-05 DIAGNOSIS — M542 Cervicalgia: Secondary | ICD-10-CM | POA: Diagnosis not present

## 2018-05-05 DIAGNOSIS — M545 Low back pain: Secondary | ICD-10-CM | POA: Diagnosis not present

## 2018-05-05 DIAGNOSIS — G5602 Carpal tunnel syndrome, left upper limb: Secondary | ICD-10-CM | POA: Diagnosis not present

## 2018-05-09 ENCOUNTER — Other Ambulatory Visit: Payer: Self-pay | Admitting: Internal Medicine

## 2018-05-09 DIAGNOSIS — M545 Low back pain: Secondary | ICD-10-CM | POA: Diagnosis not present

## 2018-05-09 DIAGNOSIS — G5602 Carpal tunnel syndrome, left upper limb: Secondary | ICD-10-CM | POA: Diagnosis not present

## 2018-05-09 DIAGNOSIS — M542 Cervicalgia: Secondary | ICD-10-CM | POA: Diagnosis not present

## 2018-05-12 ENCOUNTER — Ambulatory Visit (INDEPENDENT_AMBULATORY_CARE_PROVIDER_SITE_OTHER): Payer: Federal, State, Local not specified - PPO

## 2018-05-12 DIAGNOSIS — Z23 Encounter for immunization: Secondary | ICD-10-CM | POA: Diagnosis not present

## 2018-05-15 ENCOUNTER — Telehealth: Payer: Self-pay | Admitting: Family Medicine

## 2018-05-15 ENCOUNTER — Telehealth: Payer: Self-pay | Admitting: Internal Medicine

## 2018-05-15 DIAGNOSIS — J040 Acute laryngitis: Secondary | ICD-10-CM | POA: Diagnosis not present

## 2018-05-15 DIAGNOSIS — J039 Acute tonsillitis, unspecified: Secondary | ICD-10-CM | POA: Diagnosis not present

## 2018-05-15 NOTE — Telephone Encounter (Signed)
Pt dropped off letter Dr Alain Marion had previously written for him along with supplemental documents supporting changes he needs to be made to the document Dr. Alain Marion originally drafted for him.  Please call pt [phone]) once changes are made and he will come get supplemental documents & revised letter.  Pt stated he is needing this done ASAP.

## 2018-05-15 NOTE — Telephone Encounter (Signed)
Pt dropped off letter Dr. Tamala Julian had previously written for him with suggested revision.  Per pt, he needs this letter updated with this revision ASAP as he is under a time constraint.  Please call pt [phone]) once letter has been revised & he will come pick it up.

## 2018-05-16 ENCOUNTER — Other Ambulatory Visit: Payer: Self-pay | Admitting: Internal Medicine

## 2018-05-16 NOTE — Telephone Encounter (Signed)
I cannot say that specifically. I am able to re-word it a little bit but cannot make direct cause and effect  If he wants that then I will do what I can

## 2018-05-19 NOTE — Telephone Encounter (Signed)
Pt is calling back checking on the status of letter

## 2018-05-19 NOTE — Telephone Encounter (Signed)
Pt is calling checking on status of letter. Please advise

## 2018-05-22 ENCOUNTER — Ambulatory Visit (INDEPENDENT_AMBULATORY_CARE_PROVIDER_SITE_OTHER): Payer: Self-pay

## 2018-05-22 ENCOUNTER — Ambulatory Visit (INDEPENDENT_AMBULATORY_CARE_PROVIDER_SITE_OTHER): Payer: Federal, State, Local not specified - PPO | Admitting: Specialist

## 2018-05-22 ENCOUNTER — Telehealth (INDEPENDENT_AMBULATORY_CARE_PROVIDER_SITE_OTHER): Payer: Self-pay | Admitting: Specialist

## 2018-05-22 ENCOUNTER — Encounter (INDEPENDENT_AMBULATORY_CARE_PROVIDER_SITE_OTHER): Payer: Self-pay | Admitting: Specialist

## 2018-05-22 VITALS — BP 99/66 | HR 58 | Ht 70.0 in | Wt 163.0 lb

## 2018-05-22 DIAGNOSIS — M25531 Pain in right wrist: Secondary | ICD-10-CM

## 2018-05-22 DIAGNOSIS — M19131 Post-traumatic osteoarthritis, right wrist: Secondary | ICD-10-CM

## 2018-05-22 MED ORDER — DICLOFENAC SODIUM 1 % TD GEL: 2.0000 g | Freq: Four times a day (QID) | TRANSDERMAL | 2 refills | Status: DC

## 2018-05-22 MED ORDER — NAPROXEN 500 MG PO TABS: 500.0000 mg | ORAL_TABLET | Freq: Two times a day (BID) | ORAL | 1 refills | Status: DC

## 2018-05-22 NOTE — Telephone Encounter (Signed)
Sched pt appt for next available   07/17/18 @3pm  pt needs a 3 week f/u per Dr.Nitka

## 2018-05-22 NOTE — Progress Notes (Signed)
Post-Op Visit Note   Patient: Jorge Turner           Date of Birth: [birth year]           MRN: [id number] Visit Date: 05/22/2018 PCP: [name], MD   Assessment & Plan:Right CTR 6 weeks post op. With pain in the right wrist. Numbness andparesthesias are better.   Chief Complaint:  Chief Complaint  Patient presents with  . Right Wrist - Follow-up   Visit Diagnoses:  1. Pain in right wrist   2. Post-traumatic osteoarthritis of right wrist   Incision is healed. Mild swelling of th right volar wrist .  ROM is limited to due to osteoarthritis of the wrist that is mild.  Plan: Discontinue splint. OT for compression and swelling and for ROM and strengtheniing.   Start naprosyn for osteoarthritis right wrist  Follow-Up Instructions: No follow-ups on file.   Orders:  Orders Placed This Encounter  Procedures  . XR Wrist Complete Right  . Vitamin D 1,25 dihydroxy  . Ambulatory referral to Occupational Therapy   Meds ordered this encounter  Medications  . naproxen (NAPROSYN) 500 MG tablet    Sig: Take 1 tablet (500 mg total) by mouth 2 (two) times daily with a meal.    Dispense:  60 tablet    Refill:  1  . diclofenac sodium (VOLTAREN) 1 % GEL    Sig: Apply 2 g topically 4 (four) times daily.    Dispense:  3 Tube    Refill:  2    Imaging: No results found.  PMFS History: Patient Active Problem List   Diagnosis Date Noted  . Well adult exam 04/03/2018  . Patella-femoral syndrome 10/11/2017  . RTI (respiratory tract infection) 07/19/2017  . Right hand weakness 03/07/2017  . Extensor intersection syndrome of right wrist 12/23/2016  . Hamstring tightness of right lower extremity 12/23/2016  . Carpal tunnel syndrome, right upper limb 06/07/2016  . Thumb pain 05/17/2016  . Paresthesia 01/20/2016  . CMC (carpometacarpal) synovitis 01/05/2016  . Medial meniscus tear 10/15/2014  . Left knee pain 09/24/2014  . Neck muscle strain 03/11/2014  . Elevated PSA  06/11/2013  . Hx of balanitis 06/11/2013  . Knee pain, bilateral 02/09/2013  . Cough 02/09/2013  . Dental infection 06/15/2012  . Dysuria 12/31/2011  . Bladder neck obstruction 10/25/2011  . Rash 07/16/2011  . Urethritis 06/01/2011  . Headache(784.0) 04/05/2011  . Abdominal pain 04/05/2011  . NECK PAIN 10/19/2010  . DYSPLASTIC NEVUS, CHEST 03/23/2010  . Eczema 03/23/2010  . HYDROCELE, RIGHT 03/03/2010  . Nocturia 03/03/2010  . Personal history of colonic polyps 03/03/2010  . RECTAL BLEEDING 01/27/2010  . Unspecified disorder of male genital organs 01/27/2010  . NONSPECIFIC ABN FINDING RAD & OTH EXAM GU ORGAN 01/27/2010  . HEMATOCHEZIA 01/23/2010  . BENIGN PROSTATIC HYPERTROPHY 01/23/2010  . HAND PAIN 01/01/2010  . B12 deficiency 06/03/2009  . WRIST PAIN 06/03/2009  . Chest pain on respiration 04/25/2009  . SHOULDER STRAIN, RIGHT 04/25/2009  . FUNGAL DERMATITIS 04/12/2009  . LOW BACK PAIN 07/29/2008  . GERD 04/30/2008  . WEIGHT LOSS, ABNORMAL 04/30/2008  . HEMORRHOIDS 04/23/2008  . SEBACEOUS CYST, INFECTED 04/23/2008  . TENDINITIS, RIGHT ELBOW 10/21/2007   Past Medical History:  Diagnosis Date  . Balanitis   . BPH (benign prostatic hypertrophy)   . DJD (degenerative joint disease) of knee   . Eczema   . GERD (gastroesophageal reflux disease)   . Hemorrhoid   . Low  back pain   . Medial meniscus tear    left  . Patellofemoral arthralgia of left knee   . Personal history of colonic polyps 03/03/2010  . Vitamin B12 deficiency     Family History  Problem Relation Age of Onset  . Hypertension Mother   . Diabetes Mother   . Hypertension Father   . Diabetes Father   . Colon cancer Neg Hx   . Rectal cancer Neg Hx   . Stomach cancer Neg Hx     Past Surgical History:  Procedure Laterality Date  . CARPAL TUNNEL RELEASE Right 04/11/2018   Procedure: RIGHT OPEN CARPAL TUNNEL RELEASE;  Surgeon: Jessy Oto, MD;  Location: [facility];  Service:  Orthopedics;  Laterality: Right;  . CIRCUMCISION N/A 07/20/2013   Procedure: CIRCUMCISION ADULT;  Surgeon: Claybon Jabs, MD;  Location[facility];  Service: Urology;  Laterality: N/A;  . COLONOSCOPY    . CYSTO REMOVAL RIGHT THUMB  2006  . RIGHT HYDROCELECTOMY  11-13-2010   Social History   Occupational History  . Occupation: Programmer, applications: RETIRED  Tobacco Use  . Smoking status: Never Smoker  . Smokeless tobacco: Never Used  Substance and Sexual Activity  . Alcohol use: No  . Drug use: No  . Sexual activity: Yes

## 2018-05-22 NOTE — Patient Instructions (Addendum)
Discontinue splint. OT for compression and swelling and for ROM and strengtheniing.   Start naprosyn for osteoarthritis right wrist. Apply voltaren gel to the right wrist TID-QID

## 2018-05-23 ENCOUNTER — Telehealth (INDEPENDENT_AMBULATORY_CARE_PROVIDER_SITE_OTHER): Payer: Self-pay | Admitting: Specialist

## 2018-05-23 ENCOUNTER — Encounter: Payer: Self-pay | Admitting: Family Medicine

## 2018-05-23 DIAGNOSIS — R05 Cough: Secondary | ICD-10-CM | POA: Diagnosis not present

## 2018-05-23 DIAGNOSIS — J320 Chronic maxillary sinusitis: Secondary | ICD-10-CM | POA: Diagnosis not present

## 2018-05-23 DIAGNOSIS — J370 Chronic laryngitis: Secondary | ICD-10-CM | POA: Diagnosis not present

## 2018-05-23 DIAGNOSIS — K210 Gastro-esophageal reflux disease with esophagitis: Secondary | ICD-10-CM | POA: Diagnosis not present

## 2018-05-23 NOTE — Telephone Encounter (Signed)
Dr. Alain Marion has this, waiting to get back.

## 2018-05-23 NOTE — Telephone Encounter (Signed)
Are you familiar with this? Please advise.

## 2018-05-23 NOTE — Telephone Encounter (Addendum)
Informed patient that Dr Alain Marion is working on it.  He also asked about Dr Tamala Julian: Patient would like for Dr. Tamala Julian to modify that very last sentence, not necessarily the whole letter based on the previous recommendations.

## 2018-05-23 NOTE — Telephone Encounter (Signed)
See other TE.

## 2018-05-23 NOTE — Telephone Encounter (Signed)
I put him on the cancellation list 

## 2018-05-23 NOTE — Telephone Encounter (Signed)
Copy of 05/22/2018 OV note ready for patient to pickup

## 2018-05-23 NOTE — Telephone Encounter (Signed)
Patient calling to check on status of getting letter back from Dr. Tamala Julian and Dr. Alain Marion? Please advise on whether [facility] received it?

## 2018-05-24 DIAGNOSIS — M6281 Muscle weakness (generalized): Secondary | ICD-10-CM | POA: Diagnosis not present

## 2018-05-24 DIAGNOSIS — M545 Low back pain: Secondary | ICD-10-CM | POA: Diagnosis not present

## 2018-05-24 NOTE — Telephone Encounter (Signed)
Pt made aware paperwork is ready to be picked up at the front desk.

## 2018-05-25 LAB — VITAMIN D 1,25 DIHYDROXY
VITAMIN D 1, 25 (OH) TOTAL: 51 pg/mL (ref 18–72)
VITAMIN D3 1, 25 (OH): 51 pg/mL

## 2018-05-25 LAB — EXTRA LAV TOP TUBE

## 2018-05-29 NOTE — Telephone Encounter (Addendum)
I called pt- letter dated 03/27/18 just needs to be signed and pt will p/u. Signed letter is upfront for p/u. Pt informed

## 2018-05-29 NOTE — Telephone Encounter (Signed)
Pt is calling and would like to know the status of letter from dr Oletha Blend

## 2018-05-30 ENCOUNTER — Telehealth (INDEPENDENT_AMBULATORY_CARE_PROVIDER_SITE_OTHER): Payer: Self-pay | Admitting: Specialist

## 2018-05-30 ENCOUNTER — Ambulatory Visit: Payer: Federal, State, Local not specified - PPO | Admitting: Physical Therapy

## 2018-05-30 NOTE — Telephone Encounter (Signed)
[facility] in [place] called needing clarification on if patient should be seen for PT or OT. She did say that order would need to be changed to PT so they could treat him today. The patients appt is at 11. So she needs to know as soon as possible # [phone].   She said she'll try to call back around 9:30-10 so patient wouldn't waste a trip.

## 2018-05-30 NOTE — Telephone Encounter (Signed)
Pt called to check the status of his OT need order changed

## 2018-05-30 NOTE — Telephone Encounter (Signed)
[facility] in [place] called needing clarification on if patient should be seen for PT or OT. She did say that order would need to be changed to PT so they could treat him today. The patients appt is at 11. So she needs to know as soon as possible    She said she'll try to call back around 9:30-10 so patient wouldn't waste a trip.

## 2018-05-31 ENCOUNTER — Other Ambulatory Visit (INDEPENDENT_AMBULATORY_CARE_PROVIDER_SITE_OTHER): Payer: Self-pay | Admitting: Specialist

## 2018-05-31 DIAGNOSIS — Z9889 Other specified postprocedural states: Secondary | ICD-10-CM

## 2018-05-31 NOTE — Telephone Encounter (Signed)
I recommend occupational therapy. jen

## 2018-05-31 NOTE — Telephone Encounter (Signed)
I called and advised that [facility] will [facility] to get OT done, patient is fine with this.

## 2018-06-07 ENCOUNTER — Telehealth (INDEPENDENT_AMBULATORY_CARE_PROVIDER_SITE_OTHER): Payer: Self-pay | Admitting: Specialist

## 2018-06-07 NOTE — Telephone Encounter (Signed)
Copy of records ready for patient to pickup, per his request. AR on file

## 2018-06-08 ENCOUNTER — Ambulatory Visit (INDEPENDENT_AMBULATORY_CARE_PROVIDER_SITE_OTHER): Payer: Federal, State, Local not specified - PPO | Admitting: Specialist

## 2018-06-08 ENCOUNTER — Ambulatory Visit (INDEPENDENT_AMBULATORY_CARE_PROVIDER_SITE_OTHER): Payer: Self-pay

## 2018-06-08 ENCOUNTER — Encounter (INDEPENDENT_AMBULATORY_CARE_PROVIDER_SITE_OTHER): Payer: Self-pay | Admitting: Specialist

## 2018-06-08 VITALS — BP 114/74 | HR 54 | Ht 70.0 in | Wt 165.0 lb

## 2018-06-08 DIAGNOSIS — Z9889 Other specified postprocedural states: Secondary | ICD-10-CM

## 2018-06-08 DIAGNOSIS — M7021 Olecranon bursitis, right elbow: Secondary | ICD-10-CM

## 2018-06-08 DIAGNOSIS — M25521 Pain in right elbow: Secondary | ICD-10-CM | POA: Diagnosis not present

## 2018-06-08 NOTE — Progress Notes (Signed)
Office Visit Note   Patient: Jorge Turner           Date of Birth: [birth year]           MRN: [id number] Visit Date: 06/08/2018              Requested by: [name], MD [place], [place] [id number] PCP: [name], MD   Assessment & Plan: Visit Diagnoses:  1. Pain in right elbow   2. Olecranon bursitis, right elbow   3. Status post carpal tunnel release     Plan: Right hand and the right elbow with edema. Attend Occupational therapy to work on scar modification and edema control and grip strengthening. Also include treatment of the right  Elbow olecranon bursitis.  Ice the elbow 3-4 times a day for 15-20 each. Decrease trauma to the right elbow and wrist with splint or spider pad.  Take NSAIDs, naprosyn with the omeprazole.  Follow-Up Instructions: No follow-ups on file.   Orders:  Orders Placed This Encounter  Procedures  . XR Elbow 2 Views Right   No orders of the defined types were placed in this encounter.     Procedures: No procedures performed   Clinical Data: No additional findings.   Subjective: Chief Complaint  Patient presents with  . Right Hand - Routine Post Op  . Right Elbow - Follow-up    HPI  Review of Systems   Objective: Vital Signs: BP 114/74 (BP Location: Left Arm, Patient Position: Sitting)   Pulse (!) 54   Ht 5\' 10"  (1.778 m)   Wt 165 lb (74.8 kg)   BMI 23.68 kg/m   Physical Exam  Ortho Exam  Specialty Comments:  No specialty comments available.  Imaging: No results found.   PMFS History: Patient Active Problem List   Diagnosis Date Noted  . Well adult exam 04/03/2018  . Patella-femoral syndrome 10/11/2017  . RTI (respiratory tract infection) 07/19/2017  . Right hand weakness 03/07/2017  . Extensor intersection syndrome of right wrist 12/23/2016  . Hamstring tightness of right lower extremity 12/23/2016  . Carpal tunnel syndrome, right upper limb 06/07/2016  . Thumb pain 05/17/2016    . Paresthesia 01/20/2016  . CMC (carpometacarpal) synovitis 01/05/2016  . Medial meniscus tear 10/15/2014  . Left knee pain 09/24/2014  . Neck muscle strain 03/11/2014  . Elevated PSA 06/11/2013  . Hx of balanitis 06/11/2013  . Knee pain, bilateral 02/09/2013  . Cough 02/09/2013  . Dental infection 06/15/2012  . Dysuria 12/31/2011  . Bladder neck obstruction 10/25/2011  . Rash 07/16/2011  . Urethritis 06/01/2011  . Headache(784.0) 04/05/2011  . Abdominal pain 04/05/2011  . NECK PAIN 10/19/2010  . DYSPLASTIC NEVUS, CHEST 03/23/2010  . Eczema 03/23/2010  . HYDROCELE, RIGHT 03/03/2010  . Nocturia 03/03/2010  . Personal history of colonic polyps 03/03/2010  . RECTAL BLEEDING 01/27/2010  . Unspecified disorder of male genital organs 01/27/2010  . NONSPECIFIC ABN FINDING RAD & OTH EXAM GU ORGAN 01/27/2010  . HEMATOCHEZIA 01/23/2010  . BENIGN PROSTATIC HYPERTROPHY 01/23/2010  . HAND PAIN 01/01/2010  . B12 deficiency 06/03/2009  . WRIST PAIN 06/03/2009  . Chest pain on respiration 04/25/2009  . SHOULDER STRAIN, RIGHT 04/25/2009  . FUNGAL DERMATITIS 04/12/2009  . LOW BACK PAIN 07/29/2008  . GERD 04/30/2008  . WEIGHT LOSS, ABNORMAL 04/30/2008  . HEMORRHOIDS 04/23/2008  . SEBACEOUS CYST, INFECTED 04/23/2008  . TENDINITIS, RIGHT ELBOW 10/21/2007   Past Medical History:  Diagnosis Date  .  Balanitis   . BPH (benign prostatic hypertrophy)   . DJD (degenerative joint disease) of knee   . Eczema   . GERD (gastroesophageal reflux disease)   . Hemorrhoid   . Low back pain   . Medial meniscus tear    left  . Patellofemoral arthralgia of left knee   . Personal history of colonic polyps 03/03/2010  . Vitamin B12 deficiency     Family History  Problem Relation Age of Onset  . Hypertension Mother   . Diabetes Mother   . Hypertension Father   . Diabetes Father   . Colon cancer Neg Hx   . Rectal cancer Neg Hx   . Stomach cancer Neg Hx     Past Surgical History:  Procedure  Laterality Date  . CARPAL TUNNEL RELEASE Right 04/11/2018   Procedure: RIGHT OPEN CARPAL TUNNEL RELEASE;  Surgeon: Jessy Oto, MD;  Location: [facility];  Service: Orthopedics;  Laterality: Right;  . CIRCUMCISION N/A 07/20/2013   Procedure: CIRCUMCISION ADULT;  Surgeon: Claybon Jabs, MD;  Location: [facility];  Service: Urology;  Laterality: N/A;  . COLONOSCOPY    . CYSTO REMOVAL RIGHT THUMB  2006  . RIGHT HYDROCELECTOMY  11-13-2010   Social History   Occupational History  . Occupation: Programmer, applications: RETIRED  Tobacco Use  . Smoking status: Never Smoker  . Smokeless tobacco: Never Used  Substance and Sexual Activity  . Alcohol use: No  . Drug use: No  . Sexual activity: Yes

## 2018-06-08 NOTE — Patient Instructions (Signed)
Right hand and the right elbow with edema. Attend Occupational therapy to work on scar modification and edema control and grip strengthening. Also include treatment of the right  Elbow olecranon bursitis.  Ice the elbow 3-4 times a day for 15-20 each. Decrease trauma to the right elbow and wrist with splint or spider pad.  Take NSAIDs, naprosyn with the omeprazole.

## 2018-06-12 ENCOUNTER — Encounter (INDEPENDENT_AMBULATORY_CARE_PROVIDER_SITE_OTHER): Payer: Self-pay | Admitting: Surgery

## 2018-06-12 ENCOUNTER — Telehealth (INDEPENDENT_AMBULATORY_CARE_PROVIDER_SITE_OTHER): Payer: Self-pay

## 2018-06-12 ENCOUNTER — Ambulatory Visit: Payer: Federal, State, Local not specified - PPO | Attending: Specialist | Admitting: Occupational Therapy

## 2018-06-12 ENCOUNTER — Other Ambulatory Visit: Payer: Self-pay

## 2018-06-12 DIAGNOSIS — M6281 Muscle weakness (generalized): Secondary | ICD-10-CM | POA: Diagnosis not present

## 2018-06-12 DIAGNOSIS — M25531 Pain in right wrist: Secondary | ICD-10-CM | POA: Diagnosis not present

## 2018-06-12 DIAGNOSIS — R600 Localized edema: Secondary | ICD-10-CM | POA: Diagnosis not present

## 2018-06-12 DIAGNOSIS — R208 Other disturbances of skin sensation: Secondary | ICD-10-CM | POA: Diagnosis not present

## 2018-06-12 DIAGNOSIS — M25521 Pain in right elbow: Secondary | ICD-10-CM | POA: Insufficient documentation

## 2018-06-12 NOTE — Therapy (Signed)
[facility] [address] [place], [place], [address] Phone: [phone]   Fax:  [phone]  Occupational Therapy Evaluation  Patient Details  Name: Jorge Turner MRN: [id number] Date of Birth: [birth year] Referring Provider: Dr. Basil Dess   Encounter Date: 06/12/2018  OT End of Session - 06/12/18 1154    Visit Number  1    Number of Visits  12    Date for OT Re-Evaluation  07/28/18    Authorization Type   BC/BS Federal (23 visits used previously this year out of 57 combined PT/OT/Speech)    Authorization - Visit Number  24    Authorization - Number of Visits  75    OT Start Time  1110    OT Stop Time  1155    OT Time Calculation (min)  45 min    Activity Tolerance  Patient tolerated treatment well    Behavior During Therapy  WFL for tasks assessed/performed       Past Medical History:  Diagnosis Date  . Balanitis   . BPH (benign prostatic hypertrophy)   . DJD (degenerative joint disease) of knee   . Eczema   . GERD (gastroesophageal reflux disease)   . Hemorrhoid   . Low back pain   . Medial meniscus tear    left  . Patellofemoral arthralgia of left knee   . Personal history of colonic polyps 03/03/2010  . Vitamin B12 deficiency     Past Surgical History:  Procedure Laterality Date  . CARPAL TUNNEL RELEASE Right 04/11/2018   Procedure: RIGHT OPEN CARPAL TUNNEL RELEASE;  Surgeon: Jessy Oto, MD;  Location: [facility];  Service: Orthopedics;  Laterality: Right;  . CIRCUMCISION N/A 07/20/2013   Procedure: CIRCUMCISION ADULT;  Surgeon: Claybon Jabs, MD;  Location: [facility];  Service: Urology;  Laterality: N/A;  . COLONOSCOPY    . CYSTO REMOVAL RIGHT THUMB  2006  . RIGHT HYDROCELECTOMY  11-13-2010    There were no vitals filed for this visit.  Subjective Assessment - 06/12/18 1116    Subjective   The elbow pain just started about a week and 1/2 ago    Pertinent  History  Rt CTR 04/11/18, Rt olecranon bursitis. PMH: DJD both knees, Lt knee surgery 2016, low back pain, Rt wrist fx (1978)    Patient Stated Goals  decrease pain and reduce swelling at scar     Currently in Pain?  Yes    Pain Score  5  can fluctuate    Pain Location  Wrist and elbow    Pain Orientation  Right    Pain Descriptors / Indicators  Aching    Pain Type  Acute pain    Pain Onset  More than a month ago only a week and 1/2 ago for Rt elbow    Pain Frequency  Constant    Aggravating Factors   overuse    Pain Relieving Factors  topical rubs        [facility] OT Assessment - 06/12/18 0001      Assessment   Medical Diagnosis  Rt CTR, Rt olecranon bursitis  (order included: "recommend tx for scar, grip strengthening, scar modifcation, desensitization) (pt arrived with updated order dated 06/08/18 to include Rt olecranon bursitis, but was accidentally for P.T. Vs. O.T.)    Referring Provider  Dr. Basil Dess    Onset Date/Surgical Date  04/11/18    Hand Dominance  Right  Next MD Visit  August 2019    Prior Therapy  P.T. prior to surgery for CTS      Precautions   Precaution Comments  No heavy lifting, out of work until 06/19/18. MD wrote order for brace for Rt elbow which he will take to medical supply store      Restrictions   Weight Bearing Restrictions  No      Balance Screen   Has the patient fallen in the past 6 months  No    Has the patient had a decrease in activity level because of a fear of falling?   No    Is the patient reluctant to leave their home because of a fear of falling?   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    Leisure  bowling      ADL   Eating/Feeding  Independent    Grooming  Independent    Scientist, clinical (histocompatibility and immunogenetics)  Independent    Lower Body Bathing   Independent    Upper Body Dressing  Independent;Needs assist for fasteners    Lower Body Dressing  Modified independent difficulty tying shoes    Toilet Transfer  Independent    Toileting -  Hygiene  Independent    Tub/Shower Transfer  Independent    ADL comments  Pt reports having to switch to Lt hand to open a jar or container      IADL   Shopping  Shops independently for small purchases    Light Housekeeping  Does personal laundry completely;Performs light daily tasks such as dishwashing, bed making    Meal Prep  Able to complete simple warm meal prep;Able to complete simple cold meal and snack prep pt back to simple cooking    Investment banker, corporate own vehicle    Medication Management  Is responsible for taking medication in correct dosages at correct time      Mobility   Mobility Status  Independent      Written Expression   Dominant Hand  Right    Handwriting  90% legible cursive, 100% legible in print      Vision - History   Baseline Vision  Wears contact      Observation/Other Assessments   Observations  old Rt wrist fracture which MD feels contributed to CTS d/t bone spurs and arthritis. Elbow pain just started recently (about a week and 1/2 ago)      Sensation   Light Touch  Appears Intact    Hot/Cold  Appears Intact    Additional Comments  more sensitivity on Rt hand, pain/sensitivity at incision area      Coordination   9 Hole Peg Test  Right;Left    Right 9 Hole Peg Test  31.22 sec    Left 9 Hole Peg Test  26.00 sec      Edema   Edema  mild swelling at incision, raised scare, mild to moderate Rt elbow      ROM / Strength   AROM / PROM / Strength  AROM      AROM   Overall AROM Comments  BUE AROM WNL's except difficulty with Rt elbow extension (d/t swelling?), and Rt wrist flexion = 40* (Lt =  75*)      Hand Function   Right Hand Grip (lbs)  28 lbs    Right Hand Lateral Pinch  11 lbs    Right Hand 3 Point Pinch  8 lbs    Left Hand Grip (lbs)  90 lbs     Left Hand Lateral Pinch  22 lbs    Left 3 point pinch  18 lbs                        OT Short Term Goals - 06/12/18 1212      OT SHORT TERM GOAL #1   Title  Independent with strengthening HEP for Rt hand for CTR - 07/07/18    Time  3    Period  Weeks    Status  New      OT SHORT TERM GOAL #2   Title  Independent with scar management including scar massage and compression wrapping for pm, and desensitization techniques    Time  3    Period  Weeks    Status  New      OT SHORT TERM GOAL #3   Title  Pt to report decreased pain Rt elbow with functional tasks     Time  3    Period  Weeks    Status  New      OT SHORT TERM GOAL #4   Title  Pt to improve Rt grip strength by at least 10 lbs to assist in opening jars/containers    Baseline  eval = 28 lbs (Lt = 90 lbs)     Time  3    Period  Weeks    Status  New      OT SHORT TERM GOAL #5   Title  Pt to verbalize understanding with pain management strategies and edema management strategies for RUE    Time  3    Period  Weeks    Status  New        OT Long Term Goals - 06/12/18 1216      OT LONG TERM GOAL #1   Title  Independent with updated HEP prn for Rt elbow/wrist - 07/28/18    Time  6    Period  Weeks    Status  New      OT LONG TERM GOAL #2   Title  Pt to improve Rt grip strength by 20 lbs or more for opening jars/containers    Baseline  eval = 28 lbs (Lt = 90 lbs)     Time  6    Period  Weeks    Status  New      OT LONG TERM GOAL #3   Title  Pt to improve Rt hand lateral and 3 tip pinch strength each by 5 lbs or greater     Baseline  Eval: Lat = 11 lbs, 3 tip = 8 lbs    Time  6    Period  Weeks    Status  New      OT LONG TERM GOAL #4   Title  Pt to report greater ease and consistent use of Rt dominant hand for functional lifting, gripping and opening tasks     Time  6    Period  Weeks    Status  New            Plan - 06/12/18 1204    Clinical Impression Statement  Pt is a 62 y.o.  male who presents to outpatient rehab for O.T. services s/p CTR on 04/11/18, and recent Rt elbow olecranon bursitis. Pt with pain and swelling in both Rt wrist and Rt elbow, decreased grip and pinch strength, and decreased coordination and functional use of RUE. MD wrote: "Recommend tx for scar (silicon), grip strengthening, scar modification, desensitization"    Occupational Profile and client history currently impacting functional performance  PMH: CTS, DJD, Rt wrist fracture    Occupational performance deficits (Please refer to evaluation for details):  ADL's;IADL's;Work;Leisure    Rehab Potential  Good    Current Impairments/barriers affecting progress:  time since onset of surgery    OT Frequency  2x / week    OT Duration  6 weeks    OT Treatment/Interventions  Self-care/ADL training;Moist Heat;Fluidtherapy;DME and/or AE instruction;Splinting;Compression bandaging;Therapeutic activities;Contrast Bath;Ultrasound;Scar mobilization;Iontophoresis;Passive range of motion;Electrical Stimulation;Paraffin;Manual Therapy;Patient/family education    Plan  fluidotherapy, demo and educate pt on scar massage, issue gel padding for night wear to manage scar, putty HEP for CTR, check on updated order for O.T. (to include tx for olecranon bursitis) - pt brought in updated order for 06/08/18 to include tx for olecranon bursitis and iontophoresis but it was for P.T.     Clinical Decision Making  Several treatment options, min-mod task modification necessary    Consulted and Agree with Plan of Care  Patient       Patient will benefit from skilled therapeutic intervention in order to improve the following deficits and impairments:  Decreased range of motion, Increased edema, Impaired sensation, Impaired UE functional use, Pain, Decreased scar mobility, Decreased strength  Visit Diagnosis: Pain in right wrist - Plan: Ot plan of care cert/re-cert  Pain in right elbow - Plan: Ot plan of care cert/re-cert  Muscle  weakness (generalized) - Plan: Ot plan of care cert/re-cert  Localized edema - Plan: Ot plan of care cert/re-cert  Other disturbances of skin sensation - Plan: Ot plan of care cert/re-cert    Problem List Patient Active Problem List   Diagnosis Date Noted  . Well adult exam 04/03/2018  . Patella-femoral syndrome 10/11/2017  . RTI (respiratory tract infection) 07/19/2017  . Right hand weakness 03/07/2017  . Extensor intersection syndrome of right wrist 12/23/2016  . Hamstring tightness of right lower extremity 12/23/2016  . Carpal tunnel syndrome, right upper limb 06/07/2016  . Thumb pain 05/17/2016  . Paresthesia 01/20/2016  . CMC (carpometacarpal) synovitis 01/05/2016  . Medial meniscus tear 10/15/2014  . Left knee pain 09/24/2014  . Neck muscle strain 03/11/2014  . Elevated PSA 06/11/2013  . Hx of balanitis 06/11/2013  . Knee pain, bilateral 02/09/2013  . Cough 02/09/2013  . Dental infection 06/15/2012  . Dysuria 12/31/2011  . Bladder neck obstruction 10/25/2011  . Rash 07/16/2011  . Urethritis 06/01/2011  . Headache(784.0) 04/05/2011  . Abdominal pain 04/05/2011  . NECK PAIN 10/19/2010  . DYSPLASTIC NEVUS, CHEST 03/23/2010  . Eczema 03/23/2010  . HYDROCELE, RIGHT 03/03/2010  . Nocturia 03/03/2010  . Personal history of colonic polyps 03/03/2010  . RECTAL BLEEDING 01/27/2010  . Unspecified disorder of male genital organs 01/27/2010  . NONSPECIFIC ABN FINDING RAD & OTH EXAM GU ORGAN 01/27/2010  . HEMATOCHEZIA 01/23/2010  . BENIGN PROSTATIC HYPERTROPHY 01/23/2010  . HAND PAIN 01/01/2010  . B12 deficiency 06/03/2009  . WRIST PAIN 06/03/2009  . Chest pain on respiration 04/25/2009  . SHOULDER STRAIN, RIGHT 04/25/2009  . FUNGAL DERMATITIS 04/12/2009  . LOW BACK PAIN 07/29/2008  . GERD 04/30/2008  .  WEIGHT LOSS, ABNORMAL 04/30/2008  . HEMORRHOIDS 04/23/2008  . SEBACEOUS CYST, INFECTED 04/23/2008  . TENDINITIS, RIGHT ELBOW 10/21/2007    Carey Bullocks,  OTR/L 06/12/2018, 12:27 PM  [facility] [address] [place], [place], [address] Phone: [phone]   Fax:  [phone]  Name: Jorge Turner MRN: [id number] Date of Birth: 1956/09/09

## 2018-06-12 NOTE — Progress Notes (Signed)
Post-Op Visit Note   Patient: Jorge Turner           Date of Birth: November 07, 1956           MRN: [id number] Visit Date: 04/27/2018 PCP: [name], MD   Assessment & Plan:  Chief Complaint:  Chief Complaint  Patient presents with  . Right Wrist - Pain, Routine Post Op   Visit Diagnoses:  1. Status post carpal tunnel release     Plan: Patient follow-up in 4 weeks for recheck.  Sutures removed today.  We will work on gentle range of motion of his fingers and wrist.  He will wean out of his splint over the next several weeks.  Follow-Up Instructions: Return in about 1 month (around 05/25/2018).   Orders:  No orders of the defined types were placed in this encounter.  No orders of the defined types were placed in this encounter.   Imaging: No results found.  PMFS History: Patient Active Problem List   Diagnosis Date Noted  . Well adult exam 04/03/2018  . Patella-femoral syndrome 10/11/2017  . RTI (respiratory tract infection) 07/19/2017  . Right hand weakness 03/07/2017  . Extensor intersection syndrome of right wrist 12/23/2016  . Hamstring tightness of right lower extremity 12/23/2016  . Carpal tunnel syndrome, right upper limb 06/07/2016  . Thumb pain 05/17/2016  . Paresthesia 01/20/2016  . CMC (carpometacarpal) synovitis 01/05/2016  . Medial meniscus tear 10/15/2014  . Left knee pain 09/24/2014  . Neck muscle strain 03/11/2014  . Elevated PSA 06/11/2013  . Hx of balanitis 06/11/2013  . Knee pain, bilateral 02/09/2013  . Cough 02/09/2013  . Dental infection 06/15/2012  . Dysuria 12/31/2011  . Bladder neck obstruction 10/25/2011  . Rash 07/16/2011  . Urethritis 06/01/2011  . Headache(784.0) 04/05/2011  . Abdominal pain 04/05/2011  . NECK PAIN 10/19/2010  . DYSPLASTIC NEVUS, CHEST 03/23/2010  . Eczema 03/23/2010  . HYDROCELE, RIGHT 03/03/2010  . Nocturia 03/03/2010  . Personal history of colonic polyps 03/03/2010  . RECTAL BLEEDING 01/27/2010  .  Unspecified disorder of male genital organs 01/27/2010  . NONSPECIFIC ABN FINDING RAD & OTH EXAM GU ORGAN 01/27/2010  . HEMATOCHEZIA 01/23/2010  . BENIGN PROSTATIC HYPERTROPHY 01/23/2010  . HAND PAIN 01/01/2010  . B12 deficiency 06/03/2009  . WRIST PAIN 06/03/2009  . Chest pain on respiration 04/25/2009  . SHOULDER STRAIN, RIGHT 04/25/2009  . FUNGAL DERMATITIS 04/12/2009  . LOW BACK PAIN 07/29/2008  . GERD 04/30/2008  . WEIGHT LOSS, ABNORMAL 04/30/2008  . HEMORRHOIDS 04/23/2008  . SEBACEOUS CYST, INFECTED 04/23/2008  . TENDINITIS, RIGHT ELBOW 10/21/2007   Past Medical History:  Diagnosis Date  . Balanitis   . BPH (benign prostatic hypertrophy)   . DJD (degenerative joint disease) of knee   . Eczema   . GERD (gastroesophageal reflux disease)   . Hemorrhoid   . Low back pain   . Medial meniscus tear    left  . Patellofemoral arthralgia of left knee   . Personal history of colonic polyps 03/03/2010  . Vitamin B12 deficiency     Family History  Problem Relation Age of Onset  . Hypertension Mother   . Diabetes Mother   . Hypertension Father   . Diabetes Father   . Colon cancer Neg Hx   . Rectal cancer Neg Hx   . Stomach cancer Neg Hx     Past Surgical History:  Procedure Laterality Date  . CARPAL TUNNEL RELEASE Right 04/11/2018   Procedure:  RIGHT OPEN CARPAL TUNNEL RELEASE;  Surgeon: Jessy Oto, MD;  Location: [facility];  Service: Orthopedics;  Laterality: Right;  . CIRCUMCISION N/A 07/20/2013   Procedure: CIRCUMCISION ADULT;  Surgeon: Claybon Jabs, MD;  Location: [facility];  Service: Urology;  Laterality: N/A;  . COLONOSCOPY    . CYSTO REMOVAL RIGHT THUMB  2006  . RIGHT HYDROCELECTOMY  11-13-2010   Social History   Occupational History  . Occupation: Programmer, applications: RETIRED  Tobacco Use  . Smoking status: Never Smoker  . Smokeless tobacco: Never Used  Substance and Sexual Activity  . Alcohol use: No  .  Drug use: No  . Sexual activity: Yes   Exam Today sutures removed and Steri-Strips applied.  No drainage or signs of infection.  Moves fingers well.  Neurovascular intact

## 2018-06-12 NOTE — Telephone Encounter (Signed)
Jorge Turner called concerning an updated order for OT for patient and she also asked to be transferred to your extension to leave you a more detailed message.  Cb# is [phone]. Please advise.  Thank you.

## 2018-06-13 ENCOUNTER — Ambulatory Visit: Payer: Federal, State, Local not specified - PPO | Admitting: Occupational Therapy

## 2018-06-13 DIAGNOSIS — M6281 Muscle weakness (generalized): Secondary | ICD-10-CM | POA: Diagnosis not present

## 2018-06-13 DIAGNOSIS — R208 Other disturbances of skin sensation: Secondary | ICD-10-CM | POA: Diagnosis not present

## 2018-06-13 DIAGNOSIS — R600 Localized edema: Secondary | ICD-10-CM

## 2018-06-13 DIAGNOSIS — M25531 Pain in right wrist: Secondary | ICD-10-CM

## 2018-06-13 DIAGNOSIS — J302 Other seasonal allergic rhinitis: Secondary | ICD-10-CM | POA: Diagnosis not present

## 2018-06-13 DIAGNOSIS — M25521 Pain in right elbow: Secondary | ICD-10-CM | POA: Diagnosis not present

## 2018-06-13 NOTE — Patient Instructions (Signed)
Desensitization Techniques  Perform these exercises ever 2 hours for 15 minute sessions.  Progress to the next exercises when the exercises you are doing become easy.  1)  Using light pressure, rub the various textures along with the hypersensitive area:  A.  Flannel  E.  Polyester  B.  Velvet  F.  Corduroy  C.  Wool  G.  Cotton material  D.  Terry cloth  2)  With the same textures use a firmer pressure. 3)  Use a hand held vibrator and massage along the sensitive area. 4)  With a small dowel rod, eraser on a pencil or base of an ink pen tap along the sensitive area. 5)  Use an empty roll-on deodorant bottle to roll along the sensitive area. 6)  Place your hand/forearm in separate containers of the following items:  A.  Sand  D.  Dry lentil beans  B.  Dry Rice  E.  Dry kidney beans  C.  Ball bearings F.  Dry pinto beans  Scar Massage Purpose: To soften/smooth scar tissue.   To desensitize sensitive areas after surgery.   To mechanically break up inner scar tissue, adhesions, therefore allowing freer        movement of injured tendons and muscle.  Technique: Use a cream to massage with, as it insures a smooth gliding motion and avoids irritation caused by rubbing skin to skin.  Cream is preferred over a lotion.   May begin as soon as any suture areas are healed.   Apply a firm, steady pressure with your finger-tip, pulling the skin in a circular motion over the scarred area.  Do not rub.  Message 5 minutes, at least 2 times a day, unless you are getting tender afterwards  

## 2018-06-13 NOTE — Therapy (Signed)
[facility] [address] [place], [place], [address] Phone: [phone]   Fax:  [phone]  Occupational Therapy Treatment  Patient Details  Name: Jorge Turner MRN: [id number] Date of Birth: 10/23/1956 Referring Provider: Dr. Basil Dess   Encounter Date: 06/13/2018  OT End of Session - 06/13/18 1112    Visit Number  2    Number of Visits  12    Date for OT Re-Evaluation  07/28/18    Authorization Type   BC/BS Federal (23 visits used previously this year out of 35 combined PT/OT/Speech)    Authorization - Visit Number  25    Authorization - Number of Visits  55    OT Start Time  0932    OT Stop Time  1017    OT Time Calculation (min)  45 min    Activity Tolerance  Patient tolerated treatment well    Behavior During Therapy  [facility] for tasks assessed/performed       Past Medical History:  Diagnosis Date  . Balanitis   . BPH (benign prostatic hypertrophy)   . DJD (degenerative joint disease) of knee   . Eczema   . GERD (gastroesophageal reflux disease)   . Hemorrhoid   . Low back pain   . Medial meniscus tear    left  . Patellofemoral arthralgia of left knee   . Personal history of colonic polyps 03/03/2010  . Vitamin B12 deficiency     Past Surgical History:  Procedure Laterality Date  . CARPAL TUNNEL RELEASE Right 04/11/2018   Procedure: RIGHT OPEN CARPAL TUNNEL RELEASE;  Surgeon: Jessy Oto, MD;  Location: [facility];  Service: Orthopedics;  Laterality: Right;  . CIRCUMCISION N/A 07/20/2013   Procedure: CIRCUMCISION ADULT;  Surgeon: Claybon Jabs, MD;  Location: [facility];  Service: Urology;  Laterality: N/A;  . COLONOSCOPY    . CYSTO REMOVAL RIGHT THUMB  2006  . RIGHT HYDROCELECTOMY  11-13-2010    There were no vitals filed for this visit.  Subjective Assessment - 06/13/18 0939    Subjective   My elbow is a little better today, however my wrist pain is a little worse  today. I did some things around the house that made it sore    Pertinent History  Rt CTR 04/11/18, Rt olecranon bursitis. PMH: DJD both knees, Lt knee surgery 2016, low back pain, Rt wrist fx (1978)    Patient Stated Goals  decrease pain and reduce swelling at scar     Currently in Pain?  Yes    Pain Score  7  4/10 at elbow    Pain Location  Wrist    Pain Orientation  Right    Pain Descriptors / Indicators  Sore;Aching    Pain Type  Acute pain    Pain Onset  More than a month ago    Pain Frequency  Constant    Aggravating Factors   overuse    Pain Relieving Factors  topical rubs                   OT Treatments/Exercises (OP) - 06/13/18 0001      ADLs   ADL Comments  Pt shown scar massage and how to perform, issued and reviewed desensitization techniques, and issued gel padding for scar management and told how to apply over scar and wrap with ace wrap at night. Therapist demo wrapping and how to check for capillary return.  Exercises   Exercises  Hand      Hand Exercises   Other Hand Exercises  Pt issued putty HEP for CTR - pt demo each. Pt issued red resistance putty for home use      Modalities   Modalities  Fluidotherapy      RUE Fluidotherapy   Number Minutes Fluidotherapy  10 Minutes    RUE Fluidotherapy Location  Hand;Wrist    Comments  to decr. pain and for desensitization Rt wrist             OT Education - 06/13/18 0959    Education Details  desensitization techniques, scar massage, gel padding for scar management to wear at night, putty HEP     Person(s) Educated  Patient    Methods  Explanation;Demonstration;Handout    Comprehension  Verbalized understanding       OT Short Term Goals - 06/13/18 1113      OT SHORT TERM GOAL #1   Title  Independent with strengthening HEP for Rt hand for CTR - 07/07/18    Time  3    Period  Weeks    Status  On-going      OT SHORT TERM GOAL #2   Title  Independent with scar management including scar  massage and compression wrapping for pm, and desensitization techniques    Time  3    Period  Weeks    Status  On-going      OT SHORT TERM GOAL #3   Title  Pt to report decreased pain Rt elbow with functional tasks     Time  3    Period  Weeks    Status  New      OT SHORT TERM GOAL #4   Title  Pt to improve Rt grip strength by at least 10 lbs to assist in opening jars/containers    Baseline  eval = 28 lbs (Lt = 90 lbs)     Time  3    Period  Weeks    Status  New      OT SHORT TERM GOAL #5   Title  Pt to verbalize understanding with pain management strategies and edema management strategies for RUE    Time  3    Period  Weeks    Status  New        OT Long Term Goals - 06/12/18 1216      OT LONG TERM GOAL #1   Title  Independent with updated HEP prn for Rt elbow/wrist - 07/28/18    Time  6    Period  Weeks    Status  New      OT LONG TERM GOAL #2   Title  Pt to improve Rt grip strength by 20 lbs or more for opening jars/containers    Baseline  eval = 28 lbs (Lt = 90 lbs)     Time  6    Period  Weeks    Status  New      OT LONG TERM GOAL #3   Title  Pt to improve Rt hand lateral and 3 tip pinch strength each by 5 lbs or greater     Baseline  Eval: Lat = 11 lbs, 3 tip = 8 lbs    Time  6    Period  Weeks    Status  New      OT LONG TERM GOAL #4   Title  Pt to report greater ease and consistent  use of Rt dominant hand for functional lifting, gripping and opening tasks     Time  6    Period  Weeks    Status  New            Plan - 06/13/18 1113    Clinical Impression Statement  Pt approximating STG's #1-2. Pt tolerating tx well    Occupational Profile and client history currently impacting functional performance  PMH: CTS, DJD, Rt wrist fracture    Occupational performance deficits (Please refer to evaluation for details):  ADL's;IADL's;Work;Leisure    Rehab Potential  Good    Current Impairments/barriers affecting progress:  time since onset of surgery     OT Frequency  2x / week    OT Duration  6 weeks    OT Treatment/Interventions  Self-care/ADL training;Moist Heat;Fluidtherapy;DME and/or AE instruction;Splinting;Compression bandaging;Therapeutic activities;Contrast Bath;Ultrasound;Scar mobilization;Iontophoresis;Passive range of motion;Electrical Stimulation;Paraffin;Manual Therapy;Patient/family education    Plan  try pulsed [place] over scar area, review putty HEP and scar massage/desensitization prn, check updated order for O.T. and signed certification order - consider iontophoresis for Rt elbow or hot/cold modalities       Patient will benefit from skilled therapeutic intervention in order to improve the following deficits and impairments:  Decreased range of motion, Increased edema, Impaired sensation, Impaired UE functional use, Pain, Decreased scar mobility, Decreased strength  Visit Diagnosis: Pain in right wrist  Muscle weakness (generalized)  Other disturbances of skin sensation  Localized edema    Problem List Patient Active Problem List   Diagnosis Date Noted  . Well adult exam 04/03/2018  . Patella-femoral syndrome 10/11/2017  . RTI (respiratory tract infection) 07/19/2017  . Right hand weakness 03/07/2017  . Extensor intersection syndrome of right wrist 12/23/2016  . Hamstring tightness of right lower extremity 12/23/2016  . Carpal tunnel syndrome, right upper limb 06/07/2016  . Thumb pain 05/17/2016  . Paresthesia 01/20/2016  . CMC (carpometacarpal) synovitis 01/05/2016  . Medial meniscus tear 10/15/2014  . Left knee pain 09/24/2014  . Neck muscle strain 03/11/2014  . Elevated PSA 06/11/2013  . Hx of balanitis 06/11/2013  . Knee pain, bilateral 02/09/2013  . Cough 02/09/2013  . Dental infection 06/15/2012  . Dysuria 12/31/2011  . Bladder neck obstruction 10/25/2011  . Rash 07/16/2011  . Urethritis 06/01/2011  . Headache(784.0) 04/05/2011  . Abdominal pain 04/05/2011  . NECK PAIN 10/19/2010  . DYSPLASTIC  NEVUS, CHEST 03/23/2010  . Eczema 03/23/2010  . HYDROCELE, RIGHT 03/03/2010  . Nocturia 03/03/2010  . Personal history of colonic polyps 03/03/2010  . RECTAL BLEEDING 01/27/2010  . Unspecified disorder of male genital organs 01/27/2010  . NONSPECIFIC ABN FINDING RAD & OTH EXAM GU ORGAN 01/27/2010  . HEMATOCHEZIA 01/23/2010  . BENIGN PROSTATIC HYPERTROPHY 01/23/2010  . HAND PAIN 01/01/2010  . B12 deficiency 06/03/2009  . WRIST PAIN 06/03/2009  . Chest pain on respiration 04/25/2009  . SHOULDER STRAIN, RIGHT 04/25/2009  . FUNGAL DERMATITIS 04/12/2009  . LOW BACK PAIN 07/29/2008  . GERD 04/30/2008  . WEIGHT LOSS, ABNORMAL 04/30/2008  . HEMORRHOIDS 04/23/2008  . SEBACEOUS CYST, INFECTED 04/23/2008  . TENDINITIS, RIGHT ELBOW 10/21/2007    Carey Bullocks, OTR/L 06/13/2018, 11:16 AM  [facility] [address] [place], [place], [address] Phone: [phone]   Fax:  [phone]  Name: ARMON ORVIS MRN: [id number] Date of Birth: [birth year]

## 2018-06-14 ENCOUNTER — Other Ambulatory Visit (INDEPENDENT_AMBULATORY_CARE_PROVIDER_SITE_OTHER): Payer: Self-pay | Admitting: Radiology

## 2018-06-14 DIAGNOSIS — Z1159 Encounter for screening for other viral diseases: Secondary | ICD-10-CM | POA: Diagnosis not present

## 2018-06-14 DIAGNOSIS — M25521 Pain in right elbow: Secondary | ICD-10-CM

## 2018-06-14 DIAGNOSIS — Z0189 Encounter for other specified special examinations: Secondary | ICD-10-CM | POA: Diagnosis not present

## 2018-06-14 DIAGNOSIS — M7021 Olecranon bursitis, right elbow: Secondary | ICD-10-CM

## 2018-06-14 NOTE — Telephone Encounter (Signed)
Jorge Turner. I put in a new order for Jorge Turner.  I hope this helps. Sorry for any confusion.

## 2018-06-19 ENCOUNTER — Ambulatory Visit: Payer: Federal, State, Local not specified - PPO | Admitting: Occupational Therapy

## 2018-06-19 DIAGNOSIS — M545 Low back pain: Secondary | ICD-10-CM | POA: Diagnosis not present

## 2018-06-21 ENCOUNTER — Ambulatory Visit: Payer: Federal, State, Local not specified - PPO | Admitting: Occupational Therapy

## 2018-06-21 DIAGNOSIS — R600 Localized edema: Secondary | ICD-10-CM

## 2018-06-21 DIAGNOSIS — M6281 Muscle weakness (generalized): Secondary | ICD-10-CM | POA: Diagnosis not present

## 2018-06-21 DIAGNOSIS — M25531 Pain in right wrist: Secondary | ICD-10-CM | POA: Diagnosis not present

## 2018-06-21 DIAGNOSIS — R208 Other disturbances of skin sensation: Secondary | ICD-10-CM | POA: Diagnosis not present

## 2018-06-21 DIAGNOSIS — M25521 Pain in right elbow: Secondary | ICD-10-CM

## 2018-06-21 NOTE — Therapy (Signed)
[facility] [address] [place], [place], [address] Phone: [phone]   Fax:  [phone]  Occupational Therapy Treatment  Patient Details  Name: Jorge Turner MRN: [id number] Date of Birth: 01/08/1956 Referring Provider: Dr. Basil Dess   Encounter Date: 06/21/2018  OT End of Session - 06/21/18 1047    Visit Number  3    Number of Visits  12    Date for OT Re-Evaluation  07/28/18    Authorization Type   BC/BS Federal (23 visits used previously this year out of 21 combined PT/OT/Speech)    Authorization - Visit Number  26    Authorization - Number of Visits  22    OT Start Time  0805    OT Stop Time  0848    OT Time Calculation (min)  43 min    Activity Tolerance  Patient tolerated treatment well    Behavior During Therapy  [facility] for tasks assessed/performed       Past Medical History:  Diagnosis Date  . Balanitis   . BPH (benign prostatic hypertrophy)   . DJD (degenerative joint disease) of knee   . Eczema   . GERD (gastroesophageal reflux disease)   . Hemorrhoid   . Low back pain   . Medial meniscus tear    left  . Patellofemoral arthralgia of left knee   . Personal history of colonic polyps 03/03/2010  . Vitamin B12 deficiency     Past Surgical History:  Procedure Laterality Date  . CARPAL TUNNEL RELEASE Right 04/11/2018   Procedure: RIGHT OPEN CARPAL TUNNEL RELEASE;  Surgeon: Jessy Oto, MD;  Location: [facility];  Service: Orthopedics;  Laterality: Right;  . CIRCUMCISION N/A 07/20/2013   Procedure: CIRCUMCISION ADULT;  Surgeon: Claybon Jabs, MD;  Location: [facility];  Service: Urology;  Laterality: N/A;  . COLONOSCOPY    . CYSTO REMOVAL RIGHT THUMB  2006  . RIGHT HYDROCELECTOMY  11-13-2010    There were no vitals filed for this visit.  Subjective Assessment - 06/21/18 0834    Pertinent History  Rt CTR 04/11/18, Rt olecranon bursitis. PMH: DJD both knees, Lt knee  surgery 2016, low back pain, Rt wrist fx (1978)    Patient Stated Goals  decrease pain and reduce swelling at scar     Currently in Pain?  Yes    Pain Score  4  at elbow, 7/10 at wrist    Pain Orientation  Right    Pain Descriptors / Indicators  Aching    Pain Type  Acute pain    Pain Onset  More than a month ago    Pain Frequency  Constant    Aggravating Factors   overuse    Pain Relieving Factors  topical rubs                   OT Treatments/Exercises (OP) - 06/21/18 0001      ADLs   ADL Comments  Received updated order for O.T. to include olecranon bursitis, and iontophoresis - see EPIC. Reviewed scar massage and discussed. Pt did not feel he needed to review putty HEP       Modalities   Modalities  Ultrasound;Iontophoresis      Ultrasound   Ultrasound Location  Volar wrist around incision/scar area    Ultrasound Parameters  3 Mhz, 20% pulsed, 0.8 wts/cm2 x 8 min.     Ultrasound Goals  Pain scar management  Iontophoresis   Type of Iontophoresis  Dexamethasone no contraindications    Location  dorsal elbow just proximal to joint    Dose  2.0 cc dexamethasone, 2.2 mA     Time  18 min. on with 5 min. set up/application = 23 minutes total time             OT Education - 06/21/18 1048    Education Details  Iontophoresis precautions and contraindications (pt denies any after reviewing)    Person(s) Educated  Patient    Methods  Explanation;Handout    Comprehension  Verbalized understanding       OT Short Term Goals - 06/21/18 1051      OT SHORT TERM GOAL #1   Title  Independent with strengthening HEP for Rt hand for CTR - 07/07/18    Time  3    Period  Weeks    Status  Achieved      OT SHORT TERM GOAL #2   Title  Independent with scar management including scar massage and compression wrapping for pm, and desensitization techniques    Time  3    Period  Weeks    Status  Achieved      OT SHORT TERM GOAL #3   Title  Pt to report decreased pain  Rt elbow with functional tasks     Time  3    Period  Weeks    Status  On-going      OT SHORT TERM GOAL #4   Title  Pt to improve Rt grip strength by at least 10 lbs to assist in opening jars/containers    Baseline  eval = 28 lbs (Lt = 90 lbs)     Time  3    Period  Weeks    Status  New      OT SHORT TERM GOAL #5   Title  Pt to verbalize understanding with pain management strategies and edema management strategies for RUE    Time  3    Period  Weeks    Status  New        OT Long Term Goals - 06/12/18 1216      OT LONG TERM GOAL #1   Title  Independent with updated HEP prn for Rt elbow/wrist - 07/28/18    Time  6    Period  Weeks    Status  New      OT LONG TERM GOAL #2   Title  Pt to improve Rt grip strength by 20 lbs or more for opening jars/containers    Baseline  eval = 28 lbs (Lt = 90 lbs)     Time  6    Period  Weeks    Status  New      OT LONG TERM GOAL #3   Title  Pt to improve Rt hand lateral and 3 tip pinch strength each by 5 lbs or greater     Baseline  Eval: Lat = 11 lbs, 3 tip = 8 lbs    Time  6    Period  Weeks    Status  New      OT LONG TERM GOAL #4   Title  Pt to report greater ease and consistent use of Rt dominant hand for functional lifting, gripping and opening tasks     Time  6    Period  Weeks    Status  New  Plan - 06/21/18 1049    Clinical Impression Statement  Pt met STG's #1 and #2. Pt approximating STG #3.     Occupational Profile and client history currently impacting functional performance  PMH: CTS, DJD, Rt wrist fracture    Occupational performance deficits (Please refer to evaluation for details):  ADL's;IADL's;Work;Leisure    Rehab Potential  Good    Current Impairments/barriers affecting progress:  time since onset of surgery    OT Frequency  2x / week    OT Duration  6 weeks    OT Treatment/Interventions  Self-care/ADL training;Moist Heat;Fluidtherapy;DME and/or AE instruction;Splinting;Compression  bandaging;Therapeutic activities;Contrast Bath;Ultrasound;Scar mobilization;Iontophoresis;Passive range of motion;Electrical Stimulation;Paraffin;Manual Therapy;Patient/family education    Plan  Continue pulsed [place] over scar at volar wrist, continue dose #2 of iontophoresis at dorsal elbow    Consulted and Agree with Plan of Care  Patient       Patient will benefit from skilled therapeutic intervention in order to improve the following deficits and impairments:  Decreased range of motion, Increased edema, Impaired sensation, Impaired UE functional use, Pain, Decreased scar mobility, Decreased strength  Visit Diagnosis: Pain in right wrist  Pain in right elbow  Localized edema  Other disturbances of skin sensation    Problem List Patient Active Problem List   Diagnosis Date Noted  . Well adult exam 04/03/2018  . Patella-femoral syndrome 10/11/2017  . RTI (respiratory tract infection) 07/19/2017  . Right hand weakness 03/07/2017  . Extensor intersection syndrome of right wrist 12/23/2016  . Hamstring tightness of right lower extremity 12/23/2016  . Carpal tunnel syndrome, right upper limb 06/07/2016  . Thumb pain 05/17/2016  . Paresthesia 01/20/2016  . CMC (carpometacarpal) synovitis 01/05/2016  . Medial meniscus tear 10/15/2014  . Left knee pain 09/24/2014  . Neck muscle strain 03/11/2014  . Elevated PSA 06/11/2013  . Hx of balanitis 06/11/2013  . Knee pain, bilateral 02/09/2013  . Cough 02/09/2013  . Dental infection 06/15/2012  . Dysuria 12/31/2011  . Bladder neck obstruction 10/25/2011  . Rash 07/16/2011  . Urethritis 06/01/2011  . Headache(784.0) 04/05/2011  . Abdominal pain 04/05/2011  . NECK PAIN 10/19/2010  . DYSPLASTIC NEVUS, CHEST 03/23/2010  . Eczema 03/23/2010  . HYDROCELE, RIGHT 03/03/2010  . Nocturia 03/03/2010  . Personal history of colonic polyps 03/03/2010  . RECTAL BLEEDING 01/27/2010  . Unspecified disorder of male genital organs 01/27/2010  .  NONSPECIFIC ABN FINDING RAD & OTH EXAM GU ORGAN 01/27/2010  . HEMATOCHEZIA 01/23/2010  . BENIGN PROSTATIC HYPERTROPHY 01/23/2010  . HAND PAIN 01/01/2010  . B12 deficiency 06/03/2009  . WRIST PAIN 06/03/2009  . Chest pain on respiration 04/25/2009  . SHOULDER STRAIN, RIGHT 04/25/2009  . FUNGAL DERMATITIS 04/12/2009  . LOW BACK PAIN 07/29/2008  . GERD 04/30/2008  . WEIGHT LOSS, ABNORMAL 04/30/2008  . HEMORRHOIDS 04/23/2008  . SEBACEOUS CYST, INFECTED 04/23/2008  . TENDINITIS, RIGHT ELBOW 10/21/2007    Carey Bullocks, OTR/L 06/21/2018, 10:52 AM  [facility] [address] [place], [place], [address] Phone: [phone]   Fax:  [phone]  Name: Jorge Turner MRN: [id number] Date of Birth: Dec 15, 1955

## 2018-06-26 DIAGNOSIS — M545 Low back pain: Secondary | ICD-10-CM | POA: Diagnosis not present

## 2018-06-27 ENCOUNTER — Ambulatory Visit: Payer: Federal, State, Local not specified - PPO | Admitting: Occupational Therapy

## 2018-06-27 DIAGNOSIS — R600 Localized edema: Secondary | ICD-10-CM | POA: Diagnosis not present

## 2018-06-27 DIAGNOSIS — M25521 Pain in right elbow: Secondary | ICD-10-CM | POA: Diagnosis not present

## 2018-06-27 DIAGNOSIS — M6281 Muscle weakness (generalized): Secondary | ICD-10-CM | POA: Diagnosis not present

## 2018-06-27 DIAGNOSIS — M25531 Pain in right wrist: Secondary | ICD-10-CM

## 2018-06-27 DIAGNOSIS — R208 Other disturbances of skin sensation: Secondary | ICD-10-CM | POA: Diagnosis not present

## 2018-06-27 NOTE — Therapy (Signed)
[facility] [address] [place], [place], [address] Phone: [phone]   Fax:  [phone]  Occupational Therapy Treatment  Patient Details  Name: Jorge Turner MRN: [id number] Date of Birth: 01-Jan-1956 Referring Provider: Dr. Basil Dess   Encounter Date: 06/27/2018  OT End of Session - 06/27/18 0926    Visit Number  4    Number of Visits  12    Date for OT Re-Evaluation  07/28/18    Authorization Type   BC/BS Federal (23 visits used previously this year out of 59 combined PT/OT/Speech)    Authorization - Visit Number  45    Authorization - Number of Visits  89    OT Start Time  0850    OT Stop Time  0936    OT Time Calculation (min)  46 min    Activity Tolerance  Patient tolerated treatment well    Behavior During Therapy  [facility] for tasks assessed/performed       Past Medical History:  Diagnosis Date  . Balanitis   . BPH (benign prostatic hypertrophy)   . DJD (degenerative joint disease) of knee   . Eczema   . GERD (gastroesophageal reflux disease)   . Hemorrhoid   . Low back pain   . Medial meniscus tear    left  . Patellofemoral arthralgia of left knee   . Personal history of colonic polyps 03/03/2010  . Vitamin B12 deficiency     Past Surgical History:  Procedure Laterality Date  . CARPAL TUNNEL RELEASE Right 04/11/2018   Procedure: RIGHT OPEN CARPAL TUNNEL RELEASE;  Surgeon: Jessy Oto, MD;  Location: [facility];  Service: Orthopedics;  Laterality: Right;  . CIRCUMCISION N/A 07/20/2013   Procedure: CIRCUMCISION ADULT;  Surgeon: Claybon Jabs, MD;  Location: [facility];  Service: Urology;  Laterality: N/A;  . COLONOSCOPY    . CYSTO REMOVAL RIGHT THUMB  2006  . RIGHT HYDROCELECTOMY  11-13-2010    There were no vitals filed for this visit.  Subjective Assessment - 06/27/18 0858    Subjective   The elbow feels better. I still feel it, but it's better    Pertinent  History  Rt CTR 04/11/18, Rt olecranon bursitis. PMH: DJD both knees, Lt knee surgery 2016, low back pain, Rt wrist fx (1978)    Patient Stated Goals  decrease pain and reduce swelling at scar     Currently in Pain?  Yes    Pain Score  6     Pain Location  Wrist    Pain Orientation  Right    Pain Descriptors / Indicators  Sore    Pain Type  Acute pain    Pain Onset  More than a month ago    Pain Frequency  Constant    Aggravating Factors   overuse    Pain Relieving Factors  topical rubs                   OT Treatments/Exercises (OP) - 06/27/18 0001      ADLs   ADL Comments  Discussed importance of continuing gel padding at night, and scar massage/desensitization techniques since pt is still sensitive over incision area.       Ultrasound   Ultrasound Location  volar wrist around incision/scar area    Ultrasound Parameters  3 Mhz, 20% pulsed, 0.8 wts/cm2 x 8 min.     Ultrasound Goals  Pain scar management  Iontophoresis   Type of Iontophoresis  Dexamethasone    Location  dorsal elbow just proximal to joint    Dose  2.0 cc dexamethasone, 2.2 mA     Time  18 min. on with 5 min. set up/application = 23 minutes total time               OT Short Term Goals - 06/21/18 1051      OT SHORT TERM GOAL #1   Title  Independent with strengthening HEP for Rt hand for CTR - 07/07/18    Time  3    Period  Weeks    Status  Achieved      OT SHORT TERM GOAL #2   Title  Independent with scar management including scar massage and compression wrapping for pm, and desensitization techniques    Time  3    Period  Weeks    Status  Achieved      OT SHORT TERM GOAL #3   Title  Pt to report decreased pain Rt elbow with functional tasks     Time  3    Period  Weeks    Status  On-going      OT SHORT TERM GOAL #4   Title  Pt to improve Rt grip strength by at least 10 lbs to assist in opening jars/containers    Baseline  eval = 28 lbs (Lt = 90 lbs)     Time  3    Period   Weeks    Status  New      OT SHORT TERM GOAL #5   Title  Pt to verbalize understanding with pain management strategies and edema management strategies for RUE    Time  3    Period  Weeks    Status  New        OT Long Term Goals - 06/12/18 1216      OT LONG TERM GOAL #1   Title  Independent with updated HEP prn for Rt elbow/wrist - 07/28/18    Time  6    Period  Weeks    Status  New      OT LONG TERM GOAL #2   Title  Pt to improve Rt grip strength by 20 lbs or more for opening jars/containers    Baseline  eval = 28 lbs (Lt = 90 lbs)     Time  6    Period  Weeks    Status  New      OT LONG TERM GOAL #3   Title  Pt to improve Rt hand lateral and 3 tip pinch strength each by 5 lbs or greater     Baseline  Eval: Lat = 11 lbs, 3 tip = 8 lbs    Time  6    Period  Weeks    Status  New      OT LONG TERM GOAL #4   Title  Pt to report greater ease and consistent use of Rt dominant hand for functional lifting, gripping and opening tasks     Time  6    Period  Weeks    Status  New            Plan - 06/27/18 9381    Clinical Impression Statement  Pt with improved elbow pain (minimal), but still has fluctuating wrist pain that can go high    Occupational Profile and client history currently impacting functional performance  PMH: CTS, DJD, Rt wrist  fracture    Occupational performance deficits (Please refer to evaluation for details):  ADL's;IADL's;Work;Leisure    Rehab Potential  Good    Current Impairments/barriers affecting progress:  time since onset of surgery    OT Frequency  2x / week    OT Duration  6 weeks    OT Treatment/Interventions  Self-care/ADL training;Moist Heat;Fluidtherapy;DME and/or AE instruction;Splinting;Compression bandaging;Therapeutic activities;Contrast Bath;Ultrasound;Scar mobilization;Iontophoresis;Passive range of motion;Electrical Stimulation;Paraffin;Manual Therapy;Patient/family education    Plan  Continue pulsed [place] over scar at volar wrist,  continue dose #3 of iontophoresis at dorsal elbow, start assessing remaining STG's    Consulted and Agree with Plan of Care  Patient       Patient will benefit from skilled therapeutic intervention in order to improve the following deficits and impairments:  Decreased range of motion, Increased edema, Impaired sensation, Impaired UE functional use, Pain, Decreased scar mobility, Decreased strength  Visit Diagnosis: Pain in right wrist  Pain in right elbow  Localized edema  Other disturbances of skin sensation    Problem List Patient Active Problem List   Diagnosis Date Noted  . Well adult exam 04/03/2018  . Patella-femoral syndrome 10/11/2017  . RTI (respiratory tract infection) 07/19/2017  . Right hand weakness 03/07/2017  . Extensor intersection syndrome of right wrist 12/23/2016  . Hamstring tightness of right lower extremity 12/23/2016  . Carpal tunnel syndrome, right upper limb 06/07/2016  . Thumb pain 05/17/2016  . Paresthesia 01/20/2016  . CMC (carpometacarpal) synovitis 01/05/2016  . Medial meniscus tear 10/15/2014  . Left knee pain 09/24/2014  . Neck muscle strain 03/11/2014  . Elevated PSA 06/11/2013  . Hx of balanitis 06/11/2013  . Knee pain, bilateral 02/09/2013  . Cough 02/09/2013  . Dental infection 06/15/2012  . Dysuria 12/31/2011  . Bladder neck obstruction 10/25/2011  . Rash 07/16/2011  . Urethritis 06/01/2011  . Headache(784.0) 04/05/2011  . Abdominal pain 04/05/2011  . NECK PAIN 10/19/2010  . DYSPLASTIC NEVUS, CHEST 03/23/2010  . Eczema 03/23/2010  . HYDROCELE, RIGHT 03/03/2010  . Nocturia 03/03/2010  . Personal history of colonic polyps 03/03/2010  . RECTAL BLEEDING 01/27/2010  . Unspecified disorder of male genital organs 01/27/2010  . NONSPECIFIC ABN FINDING RAD & OTH EXAM GU ORGAN 01/27/2010  . HEMATOCHEZIA 01/23/2010  . BENIGN PROSTATIC HYPERTROPHY 01/23/2010  . HAND PAIN 01/01/2010  . B12 deficiency 06/03/2009  . WRIST PAIN 06/03/2009   . Chest pain on respiration 04/25/2009  . SHOULDER STRAIN, RIGHT 04/25/2009  . FUNGAL DERMATITIS 04/12/2009  . LOW BACK PAIN 07/29/2008  . GERD 04/30/2008  . WEIGHT LOSS, ABNORMAL 04/30/2008  . HEMORRHOIDS 04/23/2008  . SEBACEOUS CYST, INFECTED 04/23/2008  . TENDINITIS, RIGHT ELBOW 10/21/2007    Carey Bullocks, OTR/L 06/27/2018, 9:31 AM  [facility] [address] [place], [place], [address] Phone: [phone]   Fax:  [phone]  Name: Jorge Turner MRN: [id number] Date of Birth: 07-11-1956

## 2018-06-28 ENCOUNTER — Other Ambulatory Visit: Payer: Self-pay | Admitting: Physician Assistant

## 2018-06-28 DIAGNOSIS — L299 Pruritus, unspecified: Secondary | ICD-10-CM

## 2018-06-29 ENCOUNTER — Ambulatory Visit: Payer: Federal, State, Local not specified - PPO | Attending: Specialist | Admitting: Occupational Therapy

## 2018-06-29 DIAGNOSIS — M25521 Pain in right elbow: Secondary | ICD-10-CM | POA: Diagnosis not present

## 2018-06-29 DIAGNOSIS — M6281 Muscle weakness (generalized): Secondary | ICD-10-CM

## 2018-06-29 DIAGNOSIS — R600 Localized edema: Secondary | ICD-10-CM

## 2018-06-29 DIAGNOSIS — R208 Other disturbances of skin sensation: Secondary | ICD-10-CM | POA: Insufficient documentation

## 2018-06-29 DIAGNOSIS — M25531 Pain in right wrist: Secondary | ICD-10-CM | POA: Diagnosis not present

## 2018-06-29 NOTE — Telephone Encounter (Signed)
LOV 04/03/18 Dr. Alain Marion Ordered 03/17/18 by Philis Fendt for poison ivy.

## 2018-06-29 NOTE — Therapy (Signed)
[facility] [address] [place], [place], [address] Phone: [phone]   Fax:  [phone]  Occupational Therapy Treatment  Patient Details  Name: Jorge Turner MRN: [id number] Date of Birth: 1955/12/26 Referring Provider: Dr. Basil Dess   Encounter Date: 06/29/2018  OT End of Session - 06/29/18 0838    Visit Number  5    Number of Visits  12    Date for OT Re-Evaluation  07/28/18    Authorization Type   BC/BS Federal (23 visits used previously this year out of 33 combined PT/OT/Speech)    Authorization - Visit Number  28    Authorization - Number of Visits  56    OT Start Time  0815    OT Stop Time  0845    OT Time Calculation (min)  30 min    Activity Tolerance  Patient tolerated treatment well    Behavior During Therapy  [facility] for tasks assessed/performed       Past Medical History:  Diagnosis Date  . Balanitis   . BPH (benign prostatic hypertrophy)   . DJD (degenerative joint disease) of knee   . Eczema   . GERD (gastroesophageal reflux disease)   . Hemorrhoid   . Low back pain   . Medial meniscus tear    left  . Patellofemoral arthralgia of left knee   . Personal history of colonic polyps 03/03/2010  . Vitamin B12 deficiency     Past Surgical History:  Procedure Laterality Date  . CARPAL TUNNEL RELEASE Right 04/11/2018   Procedure: RIGHT OPEN CARPAL TUNNEL RELEASE;  Surgeon: Jessy Oto, MD;  Location: [facility];  Service: Orthopedics;  Laterality: Right;  . CIRCUMCISION N/A 07/20/2013   Procedure: CIRCUMCISION ADULT;  Surgeon: Claybon Jabs, MD;  Location: [facility];  Service: Urology;  Laterality: N/A;  . COLONOSCOPY    . CYSTO REMOVAL RIGHT THUMB  2006  . RIGHT HYDROCELECTOMY  11-13-2010    There were no vitals filed for this visit.  Subjective Assessment - 06/29/18 0815    Subjective   My wrist is still sore. I woke up with it sore this morning    Pertinent  History  Rt CTR 04/11/18, Rt olecranon bursitis. PMH: DJD both knees, Lt knee surgery 2016, low back pain, Rt wrist fx (1978)    Patient Stated Goals  decrease pain and reduce swelling at scar     Currently in Pain?  Yes    Pain Score  7  3/10 pain at Rt elbow    Pain Location  Wrist    Pain Orientation  Right    Pain Descriptors / Indicators  Sore    Pain Type  Acute pain    Pain Onset  More than a month ago    Pain Frequency  Constant    Aggravating Factors   overuse    Pain Relieving Factors  topical rubs         [facility] OT Assessment - 06/29/18 0001      Hand Function   Right Hand Grip (lbs)  42 lbs               OT Treatments/Exercises (OP) - 06/29/18 0001      ADLs   ADL Comments  Assessed remaining STG's. Rt grip strength improved. Pt also shown wrist/thumb stretch for carpal tunnel area      Iontophoresis   Type of Iontophoresis  Dexamethasone  Location  dorsal elbow just proximal to joint    Dose  2.0 cc dexamethasone, 2.5 mA     Time  15 min. on with 5 min. set up/application = 20 minutes total time               OT Short Term Goals - 06/29/18 0839      OT SHORT TERM GOAL #1   Title  Independent with strengthening HEP for Rt hand for CTR - 07/07/18    Time  3    Period  Weeks    Status  Achieved      OT SHORT TERM GOAL #2   Title  Independent with scar management including scar massage and compression wrapping for pm, and desensitization techniques    Time  3    Period  Weeks    Status  Achieved      OT SHORT TERM GOAL #3   Title  Pt to report decreased pain Rt elbow with functional tasks     Time  3    Period  Weeks    Status  Achieved      OT SHORT TERM GOAL #4   Title  Pt to improve Rt grip strength by at least 10 lbs to assist in opening jars/containers    Baseline  eval = 28 lbs (Lt = 90 lbs)     Time  3    Period  Weeks    Status  Achieved 42 lbs      OT SHORT TERM GOAL #5   Title  Pt to verbalize understanding with pain  management strategies and edema management strategies for RUE    Time  3    Period  Weeks    Status  Deferred no longer needed        OT Long Term Goals - 06/12/18 1216      OT LONG TERM GOAL #1   Title  Independent with updated HEP prn for Rt elbow/wrist - 07/28/18    Time  6    Period  Weeks    Status  New      OT LONG TERM GOAL #2   Title  Pt to improve Rt grip strength by 20 lbs or more for opening jars/containers    Baseline  eval = 28 lbs (Lt = 90 lbs)     Time  6    Period  Weeks    Status  New      OT LONG TERM GOAL #3   Title  Pt to improve Rt hand lateral and 3 tip pinch strength each by 5 lbs or greater     Baseline  Eval: Lat = 11 lbs, 3 tip = 8 lbs    Time  6    Period  Weeks    Status  New      OT LONG TERM GOAL #4   Title  Pt to report greater ease and consistent use of Rt dominant hand for functional lifting, gripping and opening tasks     Time  6    Period  Weeks    Status  New            Plan - 06/29/18 0840    Clinical Impression Statement  Pt with improved elbow pain (minimal), but still has fluctuating higher wrist pain    Occupational Profile and client history currently impacting functional performance  PMH: CTS, DJD, Rt wrist fracture    Occupational performance deficits (Please refer  to evaluation for details):  ADL's;IADL's;Work;Leisure    Rehab Potential  Good    Current Impairments/barriers affecting progress:  time since onset of surgery    OT Frequency  2x / week    OT Duration  6 weeks    OT Treatment/Interventions  Self-care/ADL training;Moist Heat;Fluidtherapy;DME and/or AE instruction;Splinting;Compression bandaging;Therapeutic activities;Contrast Bath;Ultrasound;Scar mobilization;Iontophoresis;Passive range of motion;Electrical Stimulation;Paraffin;Manual Therapy;Patient/family education    Plan  fluidotherapy, continue iontophoresis dose #4, grip strength Rt hand    Consulted and Agree with Plan of Care  Patient       Patient  will benefit from skilled therapeutic intervention in order to improve the following deficits and impairments:  Decreased range of motion, Increased edema, Impaired sensation, Impaired UE functional use, Pain, Decreased scar mobility, Decreased strength  Visit Diagnosis: Pain in right wrist  Pain in right elbow  Localized edema  Muscle weakness (generalized)    Problem List Patient Active Problem List   Diagnosis Date Noted  . Well adult exam 04/03/2018  . Patella-femoral syndrome 10/11/2017  . RTI (respiratory tract infection) 07/19/2017  . Right hand weakness 03/07/2017  . Extensor intersection syndrome of right wrist 12/23/2016  . Hamstring tightness of right lower extremity 12/23/2016  . Carpal tunnel syndrome, right upper limb 06/07/2016  . Thumb pain 05/17/2016  . Paresthesia 01/20/2016  . CMC (carpometacarpal) synovitis 01/05/2016  . Medial meniscus tear 10/15/2014  . Left knee pain 09/24/2014  . Neck muscle strain 03/11/2014  . Elevated PSA 06/11/2013  . Hx of balanitis 06/11/2013  . Knee pain, bilateral 02/09/2013  . Cough 02/09/2013  . Dental infection 06/15/2012  . Dysuria 12/31/2011  . Bladder neck obstruction 10/25/2011  . Rash 07/16/2011  . Urethritis 06/01/2011  . Headache(784.0) 04/05/2011  . Abdominal pain 04/05/2011  . NECK PAIN 10/19/2010  . DYSPLASTIC NEVUS, CHEST 03/23/2010  . Eczema 03/23/2010  . HYDROCELE, RIGHT 03/03/2010  . Nocturia 03/03/2010  . Personal history of colonic polyps 03/03/2010  . RECTAL BLEEDING 01/27/2010  . Unspecified disorder of male genital organs 01/27/2010  . NONSPECIFIC ABN FINDING RAD & OTH EXAM GU ORGAN 01/27/2010  . HEMATOCHEZIA 01/23/2010  . BENIGN PROSTATIC HYPERTROPHY 01/23/2010  . HAND PAIN 01/01/2010  . B12 deficiency 06/03/2009  . WRIST PAIN 06/03/2009  . Chest pain on respiration 04/25/2009  . SHOULDER STRAIN, RIGHT 04/25/2009  . FUNGAL DERMATITIS 04/12/2009  . LOW BACK PAIN 07/29/2008  . GERD  04/30/2008  . WEIGHT LOSS, ABNORMAL 04/30/2008  . HEMORRHOIDS 04/23/2008  . SEBACEOUS CYST, INFECTED 04/23/2008  . TENDINITIS, RIGHT ELBOW 10/21/2007    Carey Bullocks, OTR/L 06/29/2018, 8:43 AM  [facility] [address] [place], [place], [address] Phone: [phone]   Fax:  [phone]  Name: Jorge Turner MRN: [id number] Date of Birth: November 01, 1956

## 2018-06-30 NOTE — Telephone Encounter (Signed)
Please advise about RX 

## 2018-07-03 ENCOUNTER — Encounter: Payer: Federal, State, Local not specified - PPO | Admitting: Occupational Therapy

## 2018-07-03 DIAGNOSIS — M545 Low back pain: Secondary | ICD-10-CM | POA: Diagnosis not present

## 2018-07-04 ENCOUNTER — Telehealth (INDEPENDENT_AMBULATORY_CARE_PROVIDER_SITE_OTHER): Payer: Self-pay | Admitting: Specialist

## 2018-07-04 ENCOUNTER — Ambulatory Visit: Payer: Federal, State, Local not specified - PPO | Admitting: Occupational Therapy

## 2018-07-04 DIAGNOSIS — M6281 Muscle weakness (generalized): Secondary | ICD-10-CM

## 2018-07-04 DIAGNOSIS — R600 Localized edema: Secondary | ICD-10-CM | POA: Diagnosis not present

## 2018-07-04 DIAGNOSIS — M25521 Pain in right elbow: Secondary | ICD-10-CM

## 2018-07-04 DIAGNOSIS — R208 Other disturbances of skin sensation: Secondary | ICD-10-CM | POA: Diagnosis not present

## 2018-07-04 DIAGNOSIS — M25531 Pain in right wrist: Secondary | ICD-10-CM

## 2018-07-04 NOTE — Therapy (Signed)
[facility] [address] [place], [place], [address] Phone: [phone]   Fax:  [phone]  Occupational Therapy Treatment  Patient Details  Name: Jorge Turner MRN: [id number] Date of Birth: 24-Jun-1956 Referring Provider: Dr. Basil Dess   Encounter Date: 07/04/2018  OT End of Session - 07/04/18 1013    Visit Number  6    Number of Visits  12    Date for OT Re-Evaluation  07/28/18    Authorization Type   BC/BS Federal (23 visits used previously this year out of 87 combined PT/OT/Speech)    Authorization - Visit Number  29    Authorization - Number of Visits  61    OT Start Time  [phone]    OT Stop Time  1026    OT Time Calculation (min)  48 min    Activity Tolerance  Patient tolerated treatment well    Behavior During Therapy  WFL for tasks assessed/performed       Past Medical History:  Diagnosis Date  . Balanitis   . BPH (benign prostatic hypertrophy)   . DJD (degenerative joint disease) of knee   . Eczema   . GERD (gastroesophageal reflux disease)   . Hemorrhoid   . Low back pain   . Medial meniscus tear    left  . Patellofemoral arthralgia of left knee   . Personal history of colonic polyps 03/03/2010  . Vitamin B12 deficiency     Past Surgical History:  Procedure Laterality Date  . CARPAL TUNNEL RELEASE Right 04/11/2018   Procedure: RIGHT OPEN CARPAL TUNNEL RELEASE;  Surgeon: Jessy Oto, MD;  Location: [facility];  Service: Orthopedics;  Laterality: Right;  . CIRCUMCISION N/A 07/20/2013   Procedure: CIRCUMCISION ADULT;  Surgeon: Claybon Jabs, MD;  Location: [facility];  Service: Urology;  Laterality: N/A;  . COLONOSCOPY    . CYSTO REMOVAL RIGHT THUMB  2006  . RIGHT HYDROCELECTOMY  11-13-2010    There were no vitals filed for this visit.  Subjective Assessment - 07/04/18 0940    Pertinent History  Rt CTR 04/11/18, Rt olecranon bursitis. PMH: DJD both knees, Lt knee  surgery 2016, low back pain, Rt wrist fx (1978)    Patient Stated Goals  decrease pain and reduce swelling at scar     Currently in Pain?  Yes    Pain Score  6  3/10 Rt elbow    Pain Location  Wrist    Pain Orientation  Right    Pain Descriptors / Indicators  Sore    Pain Type  Acute pain    Pain Onset  More than a month ago    Pain Frequency  Constant    Aggravating Factors   overuse    Pain Relieving Factors  topical rubs                   OT Treatments/Exercises (OP) - 07/04/18 0001      Hand Exercises   Other Hand Exercises  Pt picking up blocks with gripper set at level 2 resistance for sustained grip strength Rt hand. Pt picked up all blocks with only 3 drops and min difficulty. Then increased resistance to level 3 to pick up 1/2 amount of blocks w/ 3 drops and mod difficulty.      Iontophoresis   Type of Iontophoresis  Dexamethasone    Location  dorsal elbow just proximal to joint    Dose  2.0 cc dexamethasone, 2.5 mA     Time  16 min. on with 5 min. set up/application = 21 minutes total time      RUE Fluidotherapy   Number Minutes Fluidotherapy  10 Minutes    RUE Fluidotherapy Location  Hand;Wrist    Comments  to decr. pain and for desensitization Rt wrist/hand               OT Short Term Goals - 06/29/18 0839      OT SHORT TERM GOAL #1   Title  Independent with strengthening HEP for Rt hand for CTR - 07/07/18    Time  3    Period  Weeks    Status  Achieved      OT SHORT TERM GOAL #2   Title  Independent with scar management including scar massage and compression wrapping for pm, and desensitization techniques    Time  3    Period  Weeks    Status  Achieved      OT SHORT TERM GOAL #3   Title  Pt to report decreased pain Rt elbow with functional tasks     Time  3    Period  Weeks    Status  Achieved      OT SHORT TERM GOAL #4   Title  Pt to improve Rt grip strength by at least 10 lbs to assist in opening jars/containers    Baseline  eval  = 28 lbs (Lt = 90 lbs)     Time  3    Period  Weeks    Status  Achieved 42 lbs      OT SHORT TERM GOAL #5   Title  Pt to verbalize understanding with pain management strategies and edema management strategies for RUE    Time  3    Period  Weeks    Status  Deferred no longer needed        OT Long Term Goals - 06/12/18 1216      OT LONG TERM GOAL #1   Title  Independent with updated HEP prn for Rt elbow/wrist - 07/28/18    Time  6    Period  Weeks    Status  New      OT LONG TERM GOAL #2   Title  Pt to improve Rt grip strength by 20 lbs or more for opening jars/containers    Baseline  eval = 28 lbs (Lt = 90 lbs)     Time  6    Period  Weeks    Status  New      OT LONG TERM GOAL #3   Title  Pt to improve Rt hand lateral and 3 tip pinch strength each by 5 lbs or greater     Baseline  Eval: Lat = 11 lbs, 3 tip = 8 lbs    Time  6    Period  Weeks    Status  New      OT LONG TERM GOAL #4   Title  Pt to report greater ease and consistent use of Rt dominant hand for functional lifting, gripping and opening tasks     Time  6    Period  Weeks    Status  New            Plan - 07/04/18 1014    Clinical Impression Statement  Pt's wrist pain remains fairly high. Pt also anticipates higher pain at elbow with work tasks. Recommended  elbow cushion    Occupational Profile and client history currently impacting functional performance  PMH: CTS, DJD, Rt wrist fracture    Occupational performance deficits (Please refer to evaluation for details):  ADL's;IADL's;Work;Leisure    Rehab Potential  Good    Current Impairments/barriers affecting progress:  time since onset of surgery    OT Frequency  2x / week    OT Duration  6 weeks    OT Treatment/Interventions  Self-care/ADL training;Moist Heat;Fluidtherapy;DME and/or AE instruction;Splinting;Compression bandaging;Therapeutic activities;Contrast Bath;Ultrasound;Scar mobilization;Iontophoresis;Passive range of motion;Electrical  Stimulation;Paraffin;Manual Therapy;Patient/family education    Plan  fluidotherapy, continue iontophoresis dose #5, pinch strength    Consulted and Agree with Plan of Care  Patient       Patient will benefit from skilled therapeutic intervention in order to improve the following deficits and impairments:  Decreased range of motion, Increased edema, Impaired sensation, Impaired UE functional use, Pain, Decreased scar mobility, Decreased strength  Visit Diagnosis: Pain in right wrist  Pain in right elbow  Muscle weakness (generalized)    Problem List Patient Active Problem List   Diagnosis Date Noted  . Well adult exam 04/03/2018  . Patella-femoral syndrome 10/11/2017  . RTI (respiratory tract infection) 07/19/2017  . Right hand weakness 03/07/2017  . Extensor intersection syndrome of right wrist 12/23/2016  . Hamstring tightness of right lower extremity 12/23/2016  . Carpal tunnel syndrome, right upper limb 06/07/2016  . Thumb pain 05/17/2016  . Paresthesia 01/20/2016  . CMC (carpometacarpal) synovitis 01/05/2016  . Medial meniscus tear 10/15/2014  . Left knee pain 09/24/2014  . Neck muscle strain 03/11/2014  . Elevated PSA 06/11/2013  . Hx of balanitis 06/11/2013  . Knee pain, bilateral 02/09/2013  . Cough 02/09/2013  . Dental infection 06/15/2012  . Dysuria 12/31/2011  . Bladder neck obstruction 10/25/2011  . Rash 07/16/2011  . Urethritis 06/01/2011  . Headache(784.0) 04/05/2011  . Abdominal pain 04/05/2011  . NECK PAIN 10/19/2010  . DYSPLASTIC NEVUS, CHEST 03/23/2010  . Eczema 03/23/2010  . HYDROCELE, RIGHT 03/03/2010  . Nocturia 03/03/2010  . Personal history of colonic polyps 03/03/2010  . RECTAL BLEEDING 01/27/2010  . Unspecified disorder of male genital organs 01/27/2010  . NONSPECIFIC ABN FINDING RAD & OTH EXAM GU ORGAN 01/27/2010  . HEMATOCHEZIA 01/23/2010  . BENIGN PROSTATIC HYPERTROPHY 01/23/2010  . HAND PAIN 01/01/2010  . B12 deficiency 06/03/2009   . WRIST PAIN 06/03/2009  . Chest pain on respiration 04/25/2009  . SHOULDER STRAIN, RIGHT 04/25/2009  . FUNGAL DERMATITIS 04/12/2009  . LOW BACK PAIN 07/29/2008  . GERD 04/30/2008  . WEIGHT LOSS, ABNORMAL 04/30/2008  . HEMORRHOIDS 04/23/2008  . SEBACEOUS CYST, INFECTED 04/23/2008  . TENDINITIS, RIGHT ELBOW 10/21/2007    Carey Bullocks, OTR/L 07/04/2018, 10:17 AM  [facility] [address] [place], [place], [address] Phone: [phone]   Fax:  [phone]  Name: Jorge Turner MRN: [id number] Date of Birth: Aug 11, 1956

## 2018-07-04 NOTE — Telephone Encounter (Signed)
Patient called advised he dropped a letter off last week for Dr Louanne Skye to sign. Patient said he need the letter back as soon as possible. Patient said the [place] can not process his claim without the letter being returned. Patient said he will pick up the letter. The number to contact patient is [phone]

## 2018-07-04 NOTE — Telephone Encounter (Signed)
Patient called advised he dropped a letter off last week for Dr Louanne Skye to sign. Patient said he need the letter back as soon as possible. Patient said the [place] can not process his claim without the letter being returned. Patient said he will pick up the letter.

## 2018-07-06 ENCOUNTER — Ambulatory Visit: Payer: Federal, State, Local not specified - PPO | Admitting: Occupational Therapy

## 2018-07-06 DIAGNOSIS — M25521 Pain in right elbow: Secondary | ICD-10-CM

## 2018-07-06 DIAGNOSIS — M25531 Pain in right wrist: Secondary | ICD-10-CM

## 2018-07-06 DIAGNOSIS — R600 Localized edema: Secondary | ICD-10-CM | POA: Diagnosis not present

## 2018-07-06 DIAGNOSIS — R208 Other disturbances of skin sensation: Secondary | ICD-10-CM | POA: Diagnosis not present

## 2018-07-06 DIAGNOSIS — M6281 Muscle weakness (generalized): Secondary | ICD-10-CM

## 2018-07-06 NOTE — Therapy (Signed)
[facility] [address] [place], [place], [address] Phone: [phone]   Fax:  [phone]  Occupational Therapy Treatment  Patient Details  Name: Jorge Turner MRN: [id number] Date of Birth: 1956/03/08 Referring Provider: Dr. Basil Dess   Encounter Date: 07/06/2018  OT End of Session - 07/06/18 0844    Visit Number  7    Number of Visits  12    Date for OT Re-Evaluation  07/28/18    Authorization Type   BC/BS Federal (23 visits used previously this year out of 59 combined PT/OT/Speech)    Authorization - Visit Number  28    Authorization - Number of Visits  18    OT Start Time  0804    OT Stop Time  0850    OT Time Calculation (min)  46 min    Activity Tolerance  Patient tolerated treatment well    Behavior During Therapy  [facility] for tasks assessed/performed       Past Medical History:  Diagnosis Date  . Balanitis   . BPH (benign prostatic hypertrophy)   . DJD (degenerative joint disease) of knee   . Eczema   . GERD (gastroesophageal reflux disease)   . Hemorrhoid   . Low back pain   . Medial meniscus tear    left  . Patellofemoral arthralgia of left knee   . Personal history of colonic polyps 03/03/2010  . Vitamin B12 deficiency     Past Surgical History:  Procedure Laterality Date  . CARPAL TUNNEL RELEASE Right 04/11/2018   Procedure: RIGHT OPEN CARPAL TUNNEL RELEASE;  Surgeon: Jessy Oto, MD;  Location: [facility];  Service: Orthopedics;  Laterality: Right;  . CIRCUMCISION N/A 07/20/2013   Procedure: CIRCUMCISION ADULT;  Surgeon: Claybon Jabs, MD;  Location: [facility];  Service: Urology;  Laterality: N/A;  . COLONOSCOPY    . CYSTO REMOVAL RIGHT THUMB  2006  . RIGHT HYDROCELECTOMY  11-13-2010    There were no vitals filed for this visit.  Subjective Assessment - 07/06/18 0807    Subjective   My elbow hasn't gotten worse w/ several days in a row working    Pertinent  History  Rt CTR 04/11/18, Rt olecranon bursitis. PMH: DJD both knees, Lt knee surgery 2016, low back pain, Rt wrist fx (1978)    Patient Stated Goals  decrease pain and reduce swelling at scar     Currently in Pain?  Yes    Pain Score  6     Pain Location  Wrist    Pain Orientation  Right    Pain Descriptors / Indicators  Sore    Pain Type  Acute pain    Pain Onset  More than a month ago    Pain Frequency  Constant    Aggravating Factors   overuse    Pain Relieving Factors  topical rubs                   OT Treatments/Exercises (OP) - 07/06/18 0001      Exercises   Exercises  Wrist      Wrist Exercises   Other wrist exercises  wrist flexion, extension x 10 reps each way, 2 sets with 2 lb weight      Hand Exercises   Other Hand Exercises  Pt pinching clothespins to place and retrieve on antenna red to black resistance       Iontophoresis   Type of  Iontophoresis  Dexamethasone    Location  dorsal elbow just proximal to joint    Dose  2.0 cc dexamethasone, 2.5 mA     Time  16 min. on with 5 min. set up/application = 21 minutes total time      RUE Fluidotherapy   Number Minutes Fluidotherapy  10 Minutes    RUE Fluidotherapy Location  Hand;Wrist    Comments  to decr. pain and for sensitization                  OT Long Term Goals - 07/06/18 0845      OT LONG TERM GOAL #1   Title  Independent with updated HEP prn for Rt elbow/wrist - 07/28/18    Time  6    Period  Weeks    Status  New      OT LONG TERM GOAL #2   Title  Pt to improve Rt grip strength by 20 lbs or more for opening jars/containers    Baseline  eval = 28 lbs (Lt = 90 lbs)     Time  6    Period  Weeks    Status  On-going      OT LONG TERM GOAL #3   Title  Pt to improve Rt hand lateral and 3 tip pinch strength each by 5 lbs or greater     Baseline  Eval: Lat = 11 lbs, 3 tip = 8 lbs    Time  6    Period  Weeks    Status  On-going      OT LONG TERM GOAL #4   Title  Pt to report  greater ease and consistent use of Rt dominant hand for functional lifting, gripping and opening tasks     Time  6    Period  Weeks    Status  On-going            Plan - 07/06/18 0845    Clinical Impression Statement  Pt approximating remaining goals. Pt fairly consistent at wrist.     Occupational Profile and client history currently impacting functional performance  PMH: CTS, DJD, Rt wrist fracture    Occupational performance deficits (Please refer to evaluation for details):  ADL's;IADL's;Work;Leisure    Rehab Potential  Good    Current Impairments/barriers affecting progress:  time since onset of surgery    OT Frequency  2x / week    OT Duration  6 weeks    OT Treatment/Interventions  Self-care/ADL training;Moist Heat;Fluidtherapy;DME and/or AE instruction;Splinting;Compression bandaging;Therapeutic activities;Contrast Bath;Ultrasound;Scar mobilization;Iontophoresis;Passive range of motion;Electrical Stimulation;Paraffin;Manual Therapy;Patient/family education    Plan  fluidotherapy, continue iontophoresis dose #6 (last dose), issue strengthening HEP for wrist/elbow    Consulted and Agree with Plan of Care  Patient       Patient will benefit from skilled therapeutic intervention in order to improve the following deficits and impairments:  Decreased range of motion, Increased edema, Impaired sensation, Impaired UE functional use, Pain, Decreased scar mobility, Decreased strength  Visit Diagnosis: Pain in right wrist  Pain in right elbow  Muscle weakness (generalized)    Problem List Patient Active Problem List   Diagnosis Date Noted  . Well adult exam 04/03/2018  . Patella-femoral syndrome 10/11/2017  . RTI (respiratory tract infection) 07/19/2017  . Right hand weakness 03/07/2017  . Extensor intersection syndrome of right wrist 12/23/2016  . Hamstring tightness of right lower extremity 12/23/2016  . Carpal tunnel syndrome, right upper limb 06/07/2016  . Thumb pain  05/17/2016  . Paresthesia 01/20/2016  . CMC (carpometacarpal) synovitis 01/05/2016  . Medial meniscus tear 10/15/2014  . Left knee pain 09/24/2014  . Neck muscle strain 03/11/2014  . Elevated PSA 06/11/2013  . Hx of balanitis 06/11/2013  . Knee pain, bilateral 02/09/2013  . Cough 02/09/2013  . Dental infection 06/15/2012  . Dysuria 12/31/2011  . Bladder neck obstruction 10/25/2011  . Rash 07/16/2011  . Urethritis 06/01/2011  . Headache(784.0) 04/05/2011  . Abdominal pain 04/05/2011  . NECK PAIN 10/19/2010  . DYSPLASTIC NEVUS, CHEST 03/23/2010  . Eczema 03/23/2010  . HYDROCELE, RIGHT 03/03/2010  . Nocturia 03/03/2010  . Personal history of colonic polyps 03/03/2010  . RECTAL BLEEDING 01/27/2010  . Unspecified disorder of male genital organs 01/27/2010  . NONSPECIFIC ABN FINDING RAD & OTH EXAM GU ORGAN 01/27/2010  . HEMATOCHEZIA 01/23/2010  . BENIGN PROSTATIC HYPERTROPHY 01/23/2010  . HAND PAIN 01/01/2010  . B12 deficiency 06/03/2009  . WRIST PAIN 06/03/2009  . Chest pain on respiration 04/25/2009  . SHOULDER STRAIN, RIGHT 04/25/2009  . FUNGAL DERMATITIS 04/12/2009  . LOW BACK PAIN 07/29/2008  . GERD 04/30/2008  . WEIGHT LOSS, ABNORMAL 04/30/2008  . HEMORRHOIDS 04/23/2008  . SEBACEOUS CYST, INFECTED 04/23/2008  . TENDINITIS, RIGHT ELBOW 10/21/2007    Carey Bullocks, OTR/L 07/06/2018, 9:00 AM  [facility] [address] [place], [place], [address] Phone: [phone]   Fax:  [phone]  Name: CARYL MANAS MRN: [id number] Date of Birth: August 15, 1956

## 2018-07-10 ENCOUNTER — Encounter: Payer: Federal, State, Local not specified - PPO | Admitting: Occupational Therapy

## 2018-07-10 DIAGNOSIS — M545 Low back pain: Secondary | ICD-10-CM | POA: Diagnosis not present

## 2018-07-11 ENCOUNTER — Encounter

## 2018-07-11 ENCOUNTER — Encounter: Payer: Federal, State, Local not specified - PPO | Admitting: Occupational Therapy

## 2018-07-13 ENCOUNTER — Ambulatory Visit: Payer: Federal, State, Local not specified - PPO | Admitting: Occupational Therapy

## 2018-07-13 DIAGNOSIS — R600 Localized edema: Secondary | ICD-10-CM | POA: Diagnosis not present

## 2018-07-13 DIAGNOSIS — M948X8 Other specified disorders of cartilage, other site: Secondary | ICD-10-CM | POA: Diagnosis not present

## 2018-07-13 DIAGNOSIS — M6281 Muscle weakness (generalized): Secondary | ICD-10-CM

## 2018-07-13 DIAGNOSIS — M25862 Other specified joint disorders, left knee: Secondary | ICD-10-CM | POA: Diagnosis not present

## 2018-07-13 DIAGNOSIS — R208 Other disturbances of skin sensation: Secondary | ICD-10-CM | POA: Diagnosis not present

## 2018-07-13 DIAGNOSIS — M25531 Pain in right wrist: Secondary | ICD-10-CM

## 2018-07-13 DIAGNOSIS — M7641 Tibial collateral bursitis [Pellegrini-Stieda], right leg: Secondary | ICD-10-CM | POA: Diagnosis not present

## 2018-07-13 DIAGNOSIS — M25521 Pain in right elbow: Secondary | ICD-10-CM | POA: Diagnosis not present

## 2018-07-13 DIAGNOSIS — M23222 Derangement of posterior horn of medial meniscus due to old tear or injury, left knee: Secondary | ICD-10-CM | POA: Diagnosis not present

## 2018-07-13 DIAGNOSIS — M23221 Derangement of posterior horn of medial meniscus due to old tear or injury, right knee: Secondary | ICD-10-CM | POA: Diagnosis not present

## 2018-07-13 DIAGNOSIS — M794 Hypertrophy of (infrapatellar) fat pad: Secondary | ICD-10-CM | POA: Diagnosis not present

## 2018-07-13 NOTE — Therapy (Signed)
[facility] [address] [place], [place], [address] Phone: [phone]   Fax:  [phone]  Occupational Therapy Treatment  Patient Details  Name: Jorge Turner MRN: [id number] Date of Birth: 1956-01-08 Referring Provider: Dr. Basil Dess   Encounter Date: 07/13/2018    Past Medical History:  Diagnosis Date  . Balanitis   . BPH (benign prostatic hypertrophy)   . DJD (degenerative joint disease) of knee   . Eczema   . GERD (gastroesophageal reflux disease)   . Hemorrhoid   . Low back pain   . Medial meniscus tear    left  . Patellofemoral arthralgia of left knee   . Personal history of colonic polyps 03/03/2010  . Vitamin B12 deficiency     Past Surgical History:  Procedure Laterality Date  . CARPAL TUNNEL RELEASE Right 04/11/2018   Procedure: RIGHT OPEN CARPAL TUNNEL RELEASE;  Surgeon: Jessy Oto, MD;  Location: [facility];  Service: Orthopedics;  Laterality: Right;  . CIRCUMCISION N/A 07/20/2013   Procedure: CIRCUMCISION ADULT;  Surgeon: Claybon Jabs, MD;  Location: [facility];  Service: Urology;  Laterality: N/A;  . COLONOSCOPY    . CYSTO REMOVAL RIGHT THUMB  2006  . RIGHT HYDROCELECTOMY  11-13-2010    There were no vitals filed for this visit.  Subjective Assessment - 07/13/18 0840    Pertinent History  Rt CTR 04/11/18, Rt olecranon bursitis. PMH: DJD both knees, Lt knee surgery 2016, low back pain, Rt wrist fx (1978)    Patient Stated Goals  decrease pain and reduce swelling at scar     Currently in Pain?  Yes    Pain Score  5     Pain Location  Wrist    Pain Orientation  Right    Pain Type  Acute pain    Pain Onset  More than a month ago    Pain Frequency  Constant    Aggravating Factors   overuse    Pain Relieving Factors  topical rubs            Treatment: Exercises   Exercises  Wrist      Wrist Exercises   Other wrist exercises  wrist flexion,  extension x 10 reps each way, 2 sets with 2 lb weight      Hand Exercises   Other Hand Exercises  Gripper set at level 3 to pick up 1 inch blocks, min difficulty and drops, for sustained grip strength     Iontophoresis   Type of Iontophoresis  Dexamethasone    Location  dorsal elbow just proximal to joint    Dose  2.0 cc dexamethasone, 2.5 mA     Time  16 min.      RUE Fluidotherapy   Number Minutes Fluidotherapy  13 Minutes    RUE Fluidotherapy Location  Hand;Wrist    Comments  to decr. pain and for sensitization                       OT Short Term Goals - 06/29/18 0839      OT SHORT TERM GOAL #1   Title  Independent with strengthening HEP for Rt hand for CTR - 07/07/18    Time  3    Period  Weeks    Status  Achieved      OT SHORT TERM GOAL #2   Title  Independent with scar management including scar massage and compression  wrapping for pm, and desensitization techniques    Time  3    Period  Weeks    Status  Achieved      OT SHORT TERM GOAL #3   Title  Pt to report decreased pain Rt elbow with functional tasks     Time  3    Period  Weeks    Status  Achieved      OT SHORT TERM GOAL #4   Title  Pt to improve Rt grip strength by at least 10 lbs to assist in opening jars/containers    Baseline  eval = 28 lbs (Lt = 90 lbs)     Time  3    Period  Weeks    Status  Achieved   42 lbs     OT SHORT TERM GOAL #5   Title  Pt to verbalize understanding with pain management strategies and edema management strategies for RUE    Time  3    Period  Weeks    Status  Deferred   no longer needed       OT Long Term Goals - 07/06/18 0845      OT LONG TERM GOAL #1   Title  Independent with updated HEP prn for Rt elbow/wrist - 07/28/18    Time  6    Period  Weeks    Status  New      OT LONG TERM GOAL #2   Title  Pt to improve Rt grip strength by 20 lbs or more for opening jars/containers    Baseline  eval = 28 lbs (Lt = 90 lbs)     Time  6    Period  Weeks     Status  On-going      OT LONG TERM GOAL #3   Title  Pt to improve Rt hand lateral and 3 tip pinch strength each by 5 lbs or greater     Baseline  Eval: Lat = 11 lbs, 3 tip = 8 lbs    Time  6    Period  Weeks    Status  On-going      OT LONG TERM GOAL #4   Title  Pt to report greater ease and consistent use of Rt dominant hand for functional lifting, gripping and opening tasks     Time  6    Period  Weeks    Status  On-going            Plan - 07/13/18 0841    Clinical Impression Statement  Pt is progressing towards goals with improving overall pain and strength.    Occupational Profile and client history currently impacting functional performance  PMH: CTS, DJD, Rt wrist fracture    Occupational performance deficits (Please refer to evaluation for details):  ADL's;IADL's;Work;Leisure    Rehab Potential  Good    Current Impairments/barriers affecting progress:  time since onset of surgery    OT Frequency  2x / week    OT Duration  6 weeks    OT Treatment/Interventions  Self-care/ADL training;Moist Heat;Fluidtherapy;DME and/or AE instruction;Splinting;Compression bandaging;Therapeutic activities;Contrast Bath;Ultrasound;Scar mobilization;Iontophoresis;Passive range of motion;Electrical Stimulation;Paraffin;Manual Therapy;Patient/family education    Plan  fluidotherapy,  issue strengthening HEP for wrist/elbow    Consulted and Agree with Plan of Care  Patient       Patient will benefit from skilled therapeutic intervention in order to improve the following deficits and impairments:  Decreased range of motion, Increased edema, Impaired sensation, Impaired UE functional use,  Pain, Decreased scar mobility, Decreased strength  Visit Diagnosis: Pain in right wrist  Pain in right elbow  Muscle weakness (generalized)    Problem List Patient Active Problem List   Diagnosis Date Noted  . Well adult exam 04/03/2018  . Patella-femoral syndrome 10/11/2017  . RTI (respiratory  tract infection) 07/19/2017  . Right hand weakness 03/07/2017  . Extensor intersection syndrome of right wrist 12/23/2016  . Hamstring tightness of right lower extremity 12/23/2016  . Carpal tunnel syndrome, right upper limb 06/07/2016  . Thumb pain 05/17/2016  . Paresthesia 01/20/2016  . CMC (carpometacarpal) synovitis 01/05/2016  . Medial meniscus tear 10/15/2014  . Left knee pain 09/24/2014  . Neck muscle strain 03/11/2014  . Elevated PSA 06/11/2013  . Hx of balanitis 06/11/2013  . Knee pain, bilateral 02/09/2013  . Cough 02/09/2013  . Dental infection 06/15/2012  . Dysuria 12/31/2011  . Bladder neck obstruction 10/25/2011  . Rash 07/16/2011  . Urethritis 06/01/2011  . Headache(784.0) 04/05/2011  . Abdominal pain 04/05/2011  . NECK PAIN 10/19/2010  . DYSPLASTIC NEVUS, CHEST 03/23/2010  . Eczema 03/23/2010  . HYDROCELE, RIGHT 03/03/2010  . Nocturia 03/03/2010  . Personal history of colonic polyps 03/03/2010  . RECTAL BLEEDING 01/27/2010  . Unspecified disorder of male genital organs 01/27/2010  . NONSPECIFIC ABN FINDING RAD & OTH EXAM GU ORGAN 01/27/2010  . HEMATOCHEZIA 01/23/2010  . BENIGN PROSTATIC HYPERTROPHY 01/23/2010  . HAND PAIN 01/01/2010  . B12 deficiency 06/03/2009  . WRIST PAIN 06/03/2009  . Chest pain on respiration 04/25/2009  . SHOULDER STRAIN, RIGHT 04/25/2009  . FUNGAL DERMATITIS 04/12/2009  . LOW BACK PAIN 07/29/2008  . GERD 04/30/2008  . WEIGHT LOSS, ABNORMAL 04/30/2008  . HEMORRHOIDS 04/23/2008  . SEBACEOUS CYST, INFECTED 04/23/2008  . TENDINITIS, RIGHT ELBOW 10/21/2007    Micahel Omlor 07/13/2018, 8:42 AM  [facility] [address] [place], [place], [address] Phone: [phone]   Fax:  [phone]  Name: Jorge Turner MRN: [id number] Date of Birth: [birth year]

## 2018-07-17 ENCOUNTER — Ambulatory Visit (INDEPENDENT_AMBULATORY_CARE_PROVIDER_SITE_OTHER): Payer: Federal, State, Local not specified - PPO | Admitting: Specialist

## 2018-07-17 DIAGNOSIS — M545 Low back pain: Secondary | ICD-10-CM | POA: Diagnosis not present

## 2018-07-18 ENCOUNTER — Ambulatory Visit: Payer: Federal, State, Local not specified - PPO | Admitting: Occupational Therapy

## 2018-07-18 DIAGNOSIS — M6281 Muscle weakness (generalized): Secondary | ICD-10-CM | POA: Diagnosis not present

## 2018-07-18 DIAGNOSIS — M25521 Pain in right elbow: Secondary | ICD-10-CM

## 2018-07-18 DIAGNOSIS — R600 Localized edema: Secondary | ICD-10-CM

## 2018-07-18 DIAGNOSIS — R208 Other disturbances of skin sensation: Secondary | ICD-10-CM

## 2018-07-18 DIAGNOSIS — M25531 Pain in right wrist: Secondary | ICD-10-CM | POA: Diagnosis not present

## 2018-07-18 NOTE — Therapy (Signed)
[facility] [address] [place], [place], [address] Phone: [phone]   Fax:  [phone]  Occupational Therapy Treatment  Patient Details  Name: Jorge Turner MRN: [id number] Date of Birth: [birth year] Referring Provider: Dr. Vira Browns   Encounter Date: 07/18/2018  OT End of Session - 07/18/18 0816    Visit Number  9    Number of Visits  12    Date for OT Re-Evaluation  07/28/18    Authorization Type   BC/BS Federal (23 visits used previously this year out of 75 combined PT/OT/Speech)    Authorization - Visit Number  32    Authorization - Number of Visits  75    OT Start Time  0807    OT Stop Time  0845    OT Time Calculation (min)  38 min    Activity Tolerance  Patient tolerated treatment well    Behavior During Therapy  [facility] for tasks assessed/performed       Past Medical History:  Diagnosis Date  . Balanitis   . BPH (benign prostatic hypertrophy)   . DJD (degenerative joint disease) of knee   . Eczema   . GERD (gastroesophageal reflux disease)   . Hemorrhoid   . Low back pain   . Medial meniscus tear    left  . Patellofemoral arthralgia of left knee   . Personal history of colonic polyps 03/03/2010  . Vitamin B12 deficiency     Past Surgical History:  Procedure Laterality Date  . CARPAL TUNNEL RELEASE Right 04/11/2018   Procedure: RIGHT OPEN CARPAL TUNNEL RELEASE;  Surgeon: Kerrin Champagne, MD;  Location: [facility];  Service: Orthopedics;  Laterality: Right;  . CIRCUMCISION N/A 07/20/2013   Procedure: CIRCUMCISION ADULT;  Surgeon: Garnett Farm, MD;  Location: [facility];  Service: Urology;  Laterality: N/A;  . COLONOSCOPY    . CYSTO REMOVAL RIGHT THUMB  2006  . RIGHT HYDROCELECTOMY  11-13-2010    There were no vitals filed for this visit.  Subjective Assessment - 07/18/18 0901    Subjective   Pt reports that his arm gets more painful at end of work cycle    Pertinent History  Rt CTR 04/11/18, Rt olecranon bursitis. PMH: DJD both knees, Lt knee surgery 2016, low back pain, Rt wrist fx (1978)    Patient Stated Goals  decrease pain and reduce swelling at scar     Currently in Pain?  Yes    Pain Score  6     Pain Location  Wrist    Pain Orientation  Right    Pain Descriptors / Indicators  Aching    Pain Type  Acute pain    Pain Onset  More than a month ago    Pain Frequency  Constant    Aggravating Factors   overuse    Pain Relieving Factors  heat                 Treatment: Fluidotherapy x 10 mins to RUE, for pain relief and desensitization, no adverse reactions. Pt performed A/ROM and P/ROM supination pronation followed by strengthening HEP. See pt instructions. Arm bike x 6 mins level 4 for conditioning. Cane exercises for shoulder flexion and abduction due to right shoulder tightness, 10-15 eps each, min v.c            OT Education - 07/18/18 0900    Education Details  Stregthening HEP for wrist, forearm and elbow,  10-15, reps see pt instructions    Person(s) Educated  Patient    Methods  Explanation;Handout    Comprehension  Verbalized understanding;Returned demonstration;Verbal cues required       OT Short Term Goals - 06/29/18 0839      OT SHORT TERM GOAL #1   Title  Independent with strengthening HEP for Rt hand for CTR - 07/07/18    Time  3    Period  Weeks    Status  Achieved      OT SHORT TERM GOAL #2   Title  Independent with scar management including scar massage and compression wrapping for pm, and desensitization techniques    Time  3    Period  Weeks    Status  Achieved      OT SHORT TERM GOAL #3   Title  Pt to report decreased pain Rt elbow with functional tasks     Time  3    Period  Weeks    Status  Achieved      OT SHORT TERM GOAL #4   Title  Pt to improve Rt grip strength by at least 10 lbs to assist in opening jars/containers    Baseline  eval = 28 lbs (Lt = 90 lbs)     Time  3     Period  Weeks    Status  Achieved   42 lbs     OT SHORT TERM GOAL #5   Title  Pt to verbalize understanding with pain management strategies and edema management strategies for RUE    Time  3    Period  Weeks    Status  Deferred   no longer needed       OT Long Term Goals - 07/06/18 0845      OT LONG TERM GOAL #1   Title  Independent with updated HEP prn for Rt elbow/wrist - 07/28/18    Time  6    Period  Weeks    Status  New      OT LONG TERM GOAL #2   Title  Pt to improve Rt grip strength by 20 lbs or more for opening jars/containers    Baseline  eval = 28 lbs (Lt = 90 lbs)     Time  6    Period  Weeks    Status  On-going      OT LONG TERM GOAL #3   Title  Pt to improve Rt hand lateral and 3 tip pinch strength each by 5 lbs or greater     Baseline  Eval: Lat = 11 lbs, 3 tip = 8 lbs    Time  6    Period  Weeks    Status  On-going      OT LONG TERM GOAL #4   Title  Pt to report greater ease and consistent use of Rt dominant hand for functional lifting, gripping and opening tasks     Time  6    Period  Weeks    Status  On-going            Plan - 07/18/18 0859    Clinical Impression Statement  Pt reports he had increased pain last week after using his arm at home. Pt was encouraged to ice his elbow if he has increased pain from overuse.    Occupational Profile and client history currently impacting functional performance  PMH: CTS, DJD, Rt wrist fracture    Occupational performance deficits (Please refer  to evaluation for details):  ADL's;IADL's;Work;Leisure    OT Frequency  2x / week    OT Duration  6 weeks    OT Treatment/Interventions  Self-care/ADL training;Moist Heat;Fluidtherapy;DME and/or AE instruction;Splinting;Compression bandaging;Therapeutic activities;Contrast Bath;Ultrasound;Scar mobilization;Iontophoresis;Passive range of motion;Electrical Stimulation;Paraffin;Manual Therapy;Patient/family education    Plan  check HEP, progress towards long term  goals- pt is only scheduled for 2 more visits, discuss d/c vs. renewal    Consulted and Agree with Plan of Care  Patient       Patient will benefit from skilled therapeutic intervention in order to improve the following deficits and impairments:  Decreased range of motion, Increased edema, Impaired sensation, Impaired UE functional use, Pain, Decreased scar mobility, Decreased strength  Visit Diagnosis: Pain in right wrist  Pain in right elbow  Muscle weakness (generalized)  Other disturbances of skin sensation  Localized edema    Problem List Patient Active Problem List   Diagnosis Date Noted  . Well adult exam 04/03/2018  . Patella-femoral syndrome 10/11/2017  . RTI (respiratory tract infection) 07/19/2017  . Right hand weakness 03/07/2017  . Extensor intersection syndrome of right wrist 12/23/2016  . Hamstring tightness of right lower extremity 12/23/2016  . Carpal tunnel syndrome, right upper limb 06/07/2016  . Thumb pain 05/17/2016  . Paresthesia 01/20/2016  . CMC (carpometacarpal) synovitis 01/05/2016  . Medial meniscus tear 10/15/2014  . Left knee pain 09/24/2014  . Neck muscle strain 03/11/2014  . Elevated PSA 06/11/2013  . Hx of balanitis 06/11/2013  . Knee pain, bilateral 02/09/2013  . Cough 02/09/2013  . Dental infection 06/15/2012  . Dysuria 12/31/2011  . Bladder neck obstruction 10/25/2011  . Rash 07/16/2011  . Urethritis 06/01/2011  . Headache(784.0) 04/05/2011  . Abdominal pain 04/05/2011  . NECK PAIN 10/19/2010  . DYSPLASTIC NEVUS, CHEST 03/23/2010  . Eczema 03/23/2010  . HYDROCELE, RIGHT 03/03/2010  . Nocturia 03/03/2010  . Personal history of colonic polyps 03/03/2010  . RECTAL BLEEDING 01/27/2010  . Unspecified disorder of male genital organs 01/27/2010  . NONSPECIFIC ABN FINDING RAD & OTH EXAM GU ORGAN 01/27/2010  . HEMATOCHEZIA 01/23/2010  . BENIGN PROSTATIC HYPERTROPHY 01/23/2010  . HAND PAIN 01/01/2010  . B12 deficiency 06/03/2009  .  WRIST PAIN 06/03/2009  . Chest pain on respiration 04/25/2009  . SHOULDER STRAIN, RIGHT 04/25/2009  . FUNGAL DERMATITIS 04/12/2009  . LOW BACK PAIN 07/29/2008  . GERD 04/30/2008  . WEIGHT LOSS, ABNORMAL 04/30/2008  . HEMORRHOIDS 04/23/2008  . SEBACEOUS CYST, INFECTED 04/23/2008  . TENDINITIS, RIGHT ELBOW 10/21/2007    Kylah Maresh 07/18/2018, 9:02 AM  [facility] [address] [place], [place], [address] Phone: [phone]   Fax:  [phone]  Name: Jorge Turner MRN: [id number] Date of Birth: [birth year]

## 2018-07-18 NOTE — Patient Instructions (Signed)
AROM: Wrist Extension   .  With right__ palm down, bend wrist up. Hold water bottle lbs Repeat __15__ times per set.  Do __2_ sessions per day.    AROM: Wrist Flexion   With_right____ palm up, bend wrist up.hold water bottle 1 lbs Repeat __15__ times per set.  Do _2__ sessions per day.   AROM: Forearm Pronation / Supination   With _right__ arm in handshake position, slowly rotate palm down until stretch is felt. Relax. Then rotate palm up until stretch is felt. Hold water bottle 1 lbs Repeat _15___ times per set. Do _2__ sessions per day.  Copyright  VHI. All rights reserved.     AROM: Elbow Flexion / Extension    Gently bend elbow as far as possible. Then straighten arm as far as possible.hold water bottle 1 lbs Repeat _10-15___ times per set. Do _2__ sessions per day

## 2018-07-19 NOTE — Telephone Encounter (Signed)
Patient called inquiring whether or not Dr. Louanne Skye has signed the letter that he dropped off.  Thank you.

## 2018-07-20 ENCOUNTER — Ambulatory Visit: Payer: Federal, State, Local not specified - PPO | Admitting: Occupational Therapy

## 2018-07-20 DIAGNOSIS — M25531 Pain in right wrist: Secondary | ICD-10-CM | POA: Diagnosis not present

## 2018-07-20 DIAGNOSIS — M25521 Pain in right elbow: Secondary | ICD-10-CM

## 2018-07-20 DIAGNOSIS — M6281 Muscle weakness (generalized): Secondary | ICD-10-CM | POA: Diagnosis not present

## 2018-07-20 DIAGNOSIS — R600 Localized edema: Secondary | ICD-10-CM | POA: Diagnosis not present

## 2018-07-20 DIAGNOSIS — R208 Other disturbances of skin sensation: Secondary | ICD-10-CM | POA: Diagnosis not present

## 2018-07-20 NOTE — Telephone Encounter (Signed)
Have you signed letter for this patient?

## 2018-07-20 NOTE — Therapy (Signed)
[facility] [address] [place], [place], [address] Phone: [phone]   Fax:  [phone]  Occupational Therapy Treatment  Patient Details  Name: Jorge Turner MRN: [id number] Date of Birth: 1956/11/22 Referring Provider: Dr. Basil Dess   Encounter Date: 07/20/2018  OT End of Session - 07/20/18 1136    Visit Number  10    Number of Visits  12    Date for OT Re-Evaluation  07/28/18    Authorization Type   BC/BS Federal (23 visits used previously this year out of 71 combined PT/OT/Speech)    Authorization - Visit Number  92    Authorization - Number of Visits  56    OT Start Time  0803    OT Stop Time  0845    OT Time Calculation (min)  42 min    Activity Tolerance  Patient tolerated treatment well    Behavior During Therapy  [facility] for tasks assessed/performed       Past Medical History:  Diagnosis Date  . Balanitis   . BPH (benign prostatic hypertrophy)   . DJD (degenerative joint disease) of knee   . Eczema   . GERD (gastroesophageal reflux disease)   . Hemorrhoid   . Low back pain   . Medial meniscus tear    left  . Patellofemoral arthralgia of left knee   . Personal history of colonic polyps 03/03/2010  . Vitamin B12 deficiency     Past Surgical History:  Procedure Laterality Date  . CARPAL TUNNEL RELEASE Right 04/11/2018   Procedure: RIGHT OPEN CARPAL TUNNEL RELEASE;  Surgeon: Jessy Oto, MD;  Location: [facility];  Service: Orthopedics;  Laterality: Right;  . CIRCUMCISION N/A 07/20/2013   Procedure: CIRCUMCISION ADULT;  Surgeon: Claybon Jabs, MD;  Location: [facility];  Service: Urology;  Laterality: N/A;  . COLONOSCOPY    . CYSTO REMOVAL RIGHT THUMB  2006  . RIGHT HYDROCELECTOMY  11-13-2010    There were no vitals filed for this visit.  Subjective Assessment - 07/20/18 0808    Subjective   Ever since we added the new ex's, the pain has been higher in my wrist     Pertinent History  Rt CTR 04/11/18, Rt olecranon bursitis. PMH: DJD both knees, Lt knee surgery 2016, low back pain, Rt wrist fx (1978)    Patient Stated Goals  decrease pain and reduce swelling at scar     Currently in Pain?  Yes    Pain Score  8     Pain Location  Wrist    Pain Orientation  Right    Pain Descriptors / Indicators  Aching    Pain Type  Acute pain    Pain Onset  More than a month ago    Pain Frequency  Constant   pain constant since Tuesday   Aggravating Factors   use    Pain Relieving Factors  heat         [facility] OT Assessment - 07/20/18 0001      Observation/Other Assessments   Observations  Pt reports pain dorsal radial wrist at carpal bones not consistent with CTS - pt instructed to discuss w/ MD at visit next week (pt sees Dr. Louanne Skye 07/26/18).                OT Treatments/Exercises (OP) - 07/20/18 0001      ADLs   ADL Comments  Discussed plan going forward as pt  only has 1 more scheduled visit. Therapist feels pt should discuss pain at dorsal wrist w/ MD secondary to not consistent with CTS. Therapist has addressed CTS and treatment related to this therefore anticipate d/c next visit unless MD assesses and discovers something else going on dorsally in which case, therapist will renew w/ new diagnosis.       Wrist Exercises   Other wrist exercises  Modified HEP for wrist by limiting extreme wrist flexion and ext using 2 lb. weight. Kept elbow flex/ext exercise the same. Pt instructed to d/c supination/pronation right now d/t pain with pronation. Pt performed modified wrist flex, ext each x 10 reps w/ 2 lb weight, elbow flex/ext x 10 reps w/ 2 lb weight      RUE Fluidotherapy   Number Minutes Fluidotherapy  10 Minutes    RUE Fluidotherapy Location  Hand;Wrist    Comments  to decr. pain and for desensitization Rt wrist      Manual Therapy   Manual Therapy  Joint mobilization;Manual Traction    Manual therapy comments  soft tissue mobs at volar wrist  near incision    Joint Mobilization  at metacarpals    Manual Traction  at wrist               OT Short Term Goals - 06/29/18 5993      OT SHORT TERM GOAL #1   Title  Independent with strengthening HEP for Rt hand for CTR - 07/07/18    Time  3    Period  Weeks    Status  Achieved      OT SHORT TERM GOAL #2   Title  Independent with scar management including scar massage and compression wrapping for pm, and desensitization techniques    Time  3    Period  Weeks    Status  Achieved      OT SHORT TERM GOAL #3   Title  Pt to report decreased pain Rt elbow with functional tasks     Time  3    Period  Weeks    Status  Achieved      OT SHORT TERM GOAL #4   Title  Pt to improve Rt grip strength by at least 10 lbs to assist in opening jars/containers    Baseline  eval = 28 lbs (Lt = 90 lbs)     Time  3    Period  Weeks    Status  Achieved   42 lbs     OT SHORT TERM GOAL #5   Title  Pt to verbalize understanding with pain management strategies and edema management strategies for RUE    Time  3    Period  Weeks    Status  Deferred   no longer needed       OT Long Term Goals - 07/20/18 1137      OT LONG TERM GOAL #1   Title  Independent with updated HEP prn for Rt elbow/wrist - 07/28/18    Time  6    Period  Weeks    Status  On-going      OT LONG TERM GOAL #2   Title  Pt to improve Rt grip strength by 20 lbs or more for opening jars/containers    Baseline  eval = 28 lbs (Lt = 90 lbs)     Time  6    Period  Weeks    Status  On-going      OT LONG  TERM GOAL #3   Title  Pt to improve Rt hand lateral and 3 tip pinch strength each by 5 lbs or greater     Baseline  Eval: Lat = 11 lbs, 3 tip = 8 lbs    Time  6    Period  Weeks    Status  On-going      OT LONG TERM GOAL #4   Title  Pt to report greater ease and consistent use of Rt dominant hand for functional lifting, gripping and opening tasks     Time  6    Period  Weeks    Status  On-going             Plan - 07/20/18 1137    Clinical Impression Statement  Pt reports increased pain from strengthening HEP therefore modified today. Pt w/ noted pain dorsal and radial aspect at wrist (at carpal bone level).     Occupational Profile and client history currently impacting functional performance  PMH: CTS, DJD, Rt wrist fracture    Occupational performance deficits (Please refer to evaluation for details):  ADL's;IADL's;Work;Leisure    Rehab Potential  Good    Current Impairments/barriers affecting progress:  time since onset of surgery    OT Frequency  2x / week    OT Duration  6 weeks    OT Treatment/Interventions  Self-care/ADL training;Moist Heat;Fluidtherapy;DME and/or AE instruction;Splinting;Compression bandaging;Therapeutic activities;Contrast Bath;Ultrasound;Scar mobilization;Iontophoresis;Passive range of motion;Electrical Stimulation;Paraffin;Manual Therapy;Patient/family education    Plan  assess remaining goals, issue isometric wrist ex's if previous ex's are still bothering him w/ modifications, place on hold after next OT visit till after MD visit - if no further diagnosis, will d/c; if MD assesses and suspects something else, will plan on recert     Consulted and Agree with Plan of Care  Patient       Patient will benefit from skilled therapeutic intervention in order to improve the following deficits and impairments:  Decreased range of motion, Increased edema, Impaired sensation, Impaired UE functional use, Pain, Decreased scar mobility, Decreased strength  Visit Diagnosis: Pain in right wrist  Pain in right elbow  Muscle weakness (generalized)  Other disturbances of skin sensation    Problem List Patient Active Problem List   Diagnosis Date Noted  . Well adult exam 04/03/2018  . Patella-femoral syndrome 10/11/2017  . RTI (respiratory tract infection) 07/19/2017  . Right hand weakness 03/07/2017  . Extensor intersection syndrome of right wrist 12/23/2016   . Hamstring tightness of right lower extremity 12/23/2016  . Carpal tunnel syndrome, right upper limb 06/07/2016  . Thumb pain 05/17/2016  . Paresthesia 01/20/2016  . CMC (carpometacarpal) synovitis 01/05/2016  . Medial meniscus tear 10/15/2014  . Left knee pain 09/24/2014  . Neck muscle strain 03/11/2014  . Elevated PSA 06/11/2013  . Hx of balanitis 06/11/2013  . Knee pain, bilateral 02/09/2013  . Cough 02/09/2013  . Dental infection 06/15/2012  . Dysuria 12/31/2011  . Bladder neck obstruction 10/25/2011  . Rash 07/16/2011  . Urethritis 06/01/2011  . Headache(784.0) 04/05/2011  . Abdominal pain 04/05/2011  . NECK PAIN 10/19/2010  . DYSPLASTIC NEVUS, CHEST 03/23/2010  . Eczema 03/23/2010  . HYDROCELE, RIGHT 03/03/2010  . Nocturia 03/03/2010  . Personal history of colonic polyps 03/03/2010  . RECTAL BLEEDING 01/27/2010  . Unspecified disorder of male genital organs 01/27/2010  . NONSPECIFIC ABN FINDING RAD & OTH EXAM GU ORGAN 01/27/2010  . HEMATOCHEZIA 01/23/2010  . BENIGN PROSTATIC HYPERTROPHY 01/23/2010  . HAND PAIN  01/01/2010  . B12 deficiency 06/03/2009  . WRIST PAIN 06/03/2009  . Chest pain on respiration 04/25/2009  . SHOULDER STRAIN, RIGHT 04/25/2009  . FUNGAL DERMATITIS 04/12/2009  . LOW BACK PAIN 07/29/2008  . GERD 04/30/2008  . WEIGHT LOSS, ABNORMAL 04/30/2008  . HEMORRHOIDS 04/23/2008  . SEBACEOUS CYST, INFECTED 04/23/2008  . TENDINITIS, RIGHT ELBOW 10/21/2007    Carey Bullocks, OTR/L 07/20/2018, 11:41 AM  [facility] [address] [place], [place], [address] Phone: [phone]   Fax:  [phone]  Name: Jorge Turner MRN: [id number] Date of Birth: [birth year]

## 2018-07-21 ENCOUNTER — Other Ambulatory Visit (INDEPENDENT_AMBULATORY_CARE_PROVIDER_SITE_OTHER): Payer: Self-pay | Admitting: Specialist

## 2018-07-25 ENCOUNTER — Ambulatory Visit: Payer: Federal, State, Local not specified - PPO | Admitting: Occupational Therapy

## 2018-07-25 DIAGNOSIS — R208 Other disturbances of skin sensation: Secondary | ICD-10-CM

## 2018-07-25 DIAGNOSIS — M6281 Muscle weakness (generalized): Secondary | ICD-10-CM | POA: Diagnosis not present

## 2018-07-25 DIAGNOSIS — R600 Localized edema: Secondary | ICD-10-CM | POA: Diagnosis not present

## 2018-07-25 DIAGNOSIS — M25521 Pain in right elbow: Secondary | ICD-10-CM | POA: Diagnosis not present

## 2018-07-25 DIAGNOSIS — M25531 Pain in right wrist: Secondary | ICD-10-CM

## 2018-07-25 NOTE — Therapy (Signed)
[facility] [address] [place], [place], [address] Phone: [phone]   Fax:  [phone]  Occupational Therapy Treatment  Patient Details  Name: Jorge Turner MRN: [id number] Date of Birth: 09/27/1956 Referring Provider: Dr. Basil Dess   Encounter Date: 07/25/2018  OT End of Session - 07/25/18 0928    Visit Number  11    Number of Visits  12    Date for OT Re-Evaluation  07/28/18    Authorization Type   BC/BS Federal (23 visits used previously this year out of 2 combined PT/OT/Speech)    Authorization - Visit Number  55    Authorization - Number of Visits  61    OT Start Time  0802    OT Stop Time  0845    OT Time Calculation (min)  43 min    Activity Tolerance  Patient tolerated treatment well    Behavior During Therapy  [facility] for tasks assessed/performed       Past Medical History:  Diagnosis Date  . Balanitis   . BPH (benign prostatic hypertrophy)   . DJD (degenerative joint disease) of knee   . Eczema   . GERD (gastroesophageal reflux disease)   . Hemorrhoid   . Low back pain   . Medial meniscus tear    left  . Patellofemoral arthralgia of left knee   . Personal history of colonic polyps 03/03/2010  . Vitamin B12 deficiency     Past Surgical History:  Procedure Laterality Date  . CARPAL TUNNEL RELEASE Right 04/11/2018   Procedure: RIGHT OPEN CARPAL TUNNEL RELEASE;  Surgeon: Jessy Oto, MD;  Location: [place] [facility];  Service: Orthopedics;  Laterality: Right;  . CIRCUMCISION N/A 07/20/2013   Procedure: CIRCUMCISION ADULT;  Surgeon: Claybon Jabs, MD;  Location: [facility];  Service: Urology;  Laterality: N/A;  . COLONOSCOPY    . CYSTO REMOVAL RIGHT THUMB  2006  . RIGHT HYDROCELECTOMY  11-13-2010    There were no vitals filed for this visit.  Subjective Assessment - 07/25/18 0810    Subjective   My pain is better than last time, but still hurts. The modifications for  wrist ex's seem to help some. I see the doctor tomorrow.     Pertinent History  Rt CTR 04/11/18, Rt olecranon bursitis. PMH: DJD both knees, Lt knee surgery 2016, low back pain, Rt wrist fx (1978)    Patient Stated Goals  decrease pain and reduce swelling at scar     Currently in Pain?  Yes    Pain Score  6    elbow 2/10   Pain Location  Wrist    Pain Orientation  Right    Pain Descriptors / Indicators  Aching    Pain Type  Acute pain    Pain Onset  More than a month ago    Pain Frequency  Constant    Aggravating Factors   use    Pain Relieving Factors  heat                   OT Treatments/Exercises (OP) - 07/25/18 0001      ADLs   ADL Comments  Pt was able to get one more O.T. appointment this week after pt sees MD for follow up - will likely d/c at that time, unless new diagnosis; then will do recertification.       Wrist Exercises   Other wrist exercises  Continued wrist flex,  ext and elbow flex, ext with 2 lb weight x 10-15 reps w/ the modifications for wrist ex's (D/C sup/pron ex's due to pain w/ pronation).       Hand Exercises   Other Hand Exercises  Pt pinching clothespins to place and retrieve on antenna red to black resistance for pinch strength      RUE Fluidotherapy   Number Minutes Fluidotherapy  10 Minutes    RUE Fluidotherapy Location  Hand;Wrist    Comments  at beginning of session for pain      Manual Therapy   Manual therapy comments  soft tissue mobs at volar wrist near incision    Manual Traction  at wrist and thumb               OT Short Term Goals - 07/25/18 9024      OT SHORT TERM GOAL #1   Title  Independent with strengthening HEP for Rt hand for CTR - 07/07/18    Time  3    Period  Weeks    Status  Achieved      OT SHORT TERM GOAL #2   Title  Independent with scar management including scar massage and compression wrapping for pm, and desensitization techniques    Time  3    Period  Weeks    Status  Achieved      OT SHORT  TERM GOAL #3   Title  Pt to report decreased pain Rt elbow with functional tasks     Time  3    Period  Weeks    Status  Achieved      OT SHORT TERM GOAL #4   Title  Pt to improve Rt grip strength by at least 10 lbs to assist in opening jars/containers    Baseline  eval = 28 lbs (Lt = 90 lbs)     Time  3    Period  Weeks    Status  Achieved   42 lbs     OT SHORT TERM GOAL #5   Title  Pt to verbalize understanding with pain management strategies and edema management strategies for RUE    Time  3    Period  Weeks    Status  Achieved        OT Long Term Goals - 07/25/18 0973      OT LONG TERM GOAL #1   Title  Independent with updated HEP prn for Rt elbow/wrist - 07/28/18    Time  6    Period  Weeks    Status  Achieved      OT LONG TERM GOAL #2   Title  Pt to improve Rt grip strength by 20 lbs or more for opening jars/containers    Baseline  eval = 28 lbs (Lt = 90 lbs)     Time  6    Period  Weeks    Status  Achieved   55 lbs     OT LONG TERM GOAL #3   Title  Pt to improve Rt hand lateral and 3 tip pinch strength each by 5 lbs or greater     Baseline  Eval: Lat = 11 lbs, 3 tip = 8 lbs    Time  6    Period  Weeks    Status  Not Met   Lat = 14 lbs, 3 tip still remains 8 lbs     OT LONG TERM GOAL #4   Title  Pt to report  greater ease and consistent use of Rt dominant hand for functional lifting, gripping and opening tasks     Time  6    Period  Weeks    Status  Partially Met   met for gripping/opening, not consistent for lifting tasks           Plan - 07/25/18 3382    Clinical Impression Statement  Pt with improved grip strength, however pinch strength remains limited (see goal section). Pt tolerating modified strengthening HEP well.     Occupational Profile and client history currently impacting functional performance  PMH: CTS, DJD, Rt wrist fracture    Occupational performance deficits (Please refer to evaluation for details):  ADL's;IADL's;Work;Leisure     Rehab Potential  Good    Current Impairments/barriers affecting progress:  time since onset of surgery    OT Frequency  2x / week    OT Duration  6 weeks    OT Treatment/Interventions  Self-care/ADL training;Moist Heat;Fluidtherapy;DME and/or AE instruction;Splinting;Compression bandaging;Therapeutic activities;Contrast Bath;Ultrasound;Scar mobilization;Iontophoresis;Passive range of motion;Electrical Stimulation;Paraffin;Manual Therapy;Patient/family education    Plan  d/c vs. renewal (dependent on MD findings). If no new diagnosis, will d/c next session    Consulted and Agree with Plan of Care  Patient       Patient will benefit from skilled therapeutic intervention in order to improve the following deficits and impairments:  Decreased range of motion, Increased edema, Impaired sensation, Impaired UE functional use, Pain, Decreased scar mobility, Decreased strength  Visit Diagnosis: Pain in right wrist  Muscle weakness (generalized)  Other disturbances of skin sensation    Problem List Patient Active Problem List   Diagnosis Date Noted  . Well adult exam 04/03/2018  . Patella-femoral syndrome 10/11/2017  . RTI (respiratory tract infection) 07/19/2017  . Right hand weakness 03/07/2017  . Extensor intersection syndrome of right wrist 12/23/2016  . Hamstring tightness of right lower extremity 12/23/2016  . Carpal tunnel syndrome, right upper limb 06/07/2016  . Thumb pain 05/17/2016  . Paresthesia 01/20/2016  . CMC (carpometacarpal) synovitis 01/05/2016  . Medial meniscus tear 10/15/2014  . Left knee pain 09/24/2014  . Neck muscle strain 03/11/2014  . Elevated PSA 06/11/2013  . Hx of balanitis 06/11/2013  . Knee pain, bilateral 02/09/2013  . Cough 02/09/2013  . Dental infection 06/15/2012  . Dysuria 12/31/2011  . Bladder neck obstruction 10/25/2011  . Rash 07/16/2011  . Urethritis 06/01/2011  . Headache(784.0) 04/05/2011  . Abdominal pain 04/05/2011  . NECK PAIN  10/19/2010  . DYSPLASTIC NEVUS, CHEST 03/23/2010  . Eczema 03/23/2010  . HYDROCELE, RIGHT 03/03/2010  . Nocturia 03/03/2010  . Personal history of colonic polyps 03/03/2010  . RECTAL BLEEDING 01/27/2010  . Unspecified disorder of male genital organs 01/27/2010  . NONSPECIFIC ABN FINDING RAD & OTH EXAM GU ORGAN 01/27/2010  . HEMATOCHEZIA 01/23/2010  . BENIGN PROSTATIC HYPERTROPHY 01/23/2010  . HAND PAIN 01/01/2010  . B12 deficiency 06/03/2009  . WRIST PAIN 06/03/2009  . Chest pain on respiration 04/25/2009  . SHOULDER STRAIN, RIGHT 04/25/2009  . FUNGAL DERMATITIS 04/12/2009  . LOW BACK PAIN 07/29/2008  . GERD 04/30/2008  . WEIGHT LOSS, ABNORMAL 04/30/2008  . HEMORRHOIDS 04/23/2008  . SEBACEOUS CYST, INFECTED 04/23/2008  . TENDINITIS, RIGHT ELBOW 10/21/2007    Carey Bullocks, OTR/L 07/25/2018, 9:31 AM  [facility] [address] [place], [place], [address] Phone: [phone]   Fax:  [phone]  Name: DAULTON HARBAUGH MRN: [id number] Date of Birth: [birth year]

## 2018-07-26 ENCOUNTER — Encounter (INDEPENDENT_AMBULATORY_CARE_PROVIDER_SITE_OTHER): Payer: Self-pay | Admitting: Specialist

## 2018-07-26 ENCOUNTER — Ambulatory Visit (INDEPENDENT_AMBULATORY_CARE_PROVIDER_SITE_OTHER): Payer: Federal, State, Local not specified - PPO | Admitting: Specialist

## 2018-07-26 VITALS — BP 116/78 | HR 63 | Ht 70.0 in | Wt 165.0 lb

## 2018-07-26 DIAGNOSIS — G5601 Carpal tunnel syndrome, right upper limb: Secondary | ICD-10-CM | POA: Diagnosis not present

## 2018-07-26 DIAGNOSIS — M19131 Post-traumatic osteoarthritis, right wrist: Secondary | ICD-10-CM

## 2018-07-26 DIAGNOSIS — M654 Radial styloid tenosynovitis [de Quervain]: Secondary | ICD-10-CM | POA: Diagnosis not present

## 2018-07-26 DIAGNOSIS — M7701 Medial epicondylitis, right elbow: Secondary | ICD-10-CM | POA: Diagnosis not present

## 2018-07-26 NOTE — Patient Instructions (Signed)
Right wrist stretching and ROM and grip strengthening. Attention to be directed at right wrist 1st dorsal compartment tendonitis, De Quervains disease.  Naprosyn or motrin can help with pain.

## 2018-07-26 NOTE — Progress Notes (Signed)
Office Visit Note   Patient: Jorge Turner           Date of Birth: [birth year]           MRN: [id number] Visit Date: 07/26/2018              Requested by: [name], MD [place], [place] [address] PCP: [name], MD   Assessment & Plan: Visit Diagnoses:  1. Carpal tunnel syndrome, right upper limb   2. De Quervain's disease (tenosynovitis)   3. Post-traumatic osteoarthritis of right wrist     Plan: Right wrist stretching and ROM and grip strengthening. Attention to be directed at right wrist 1st dorsal compartment tendonitis, De Quervains disease.  Naprosyn or motrin can help with pain.   Follow-Up Instructions: No follow-ups on file.   Orders:  No orders of the defined types were placed in this encounter.  No orders of the defined types were placed in this encounter.     Procedures: No procedures performed   Clinical Data: No additional findings.   Subjective: Chief Complaint  Patient presents with  . Right Hand - Follow-up  . Right Elbow - Follow-up    HPI  Review of Systems   Objective: Vital Signs: BP 116/78 (BP Location: Left Arm, Patient Position: Sitting)   Pulse 63   Ht 5\' 10"  (1.778 m)   Wt 165 lb (74.8 kg)   BMI 23.68 kg/m   Physical Exam  Ortho Exam  Specialty Comments:  No specialty comments available.  Imaging: No results found.   PMFS History: Patient Active Problem List   Diagnosis Date Noted  . Well adult exam 04/03/2018  . Patella-femoral syndrome 10/11/2017  . RTI (respiratory tract infection) 07/19/2017  . Right hand weakness 03/07/2017  . Extensor intersection syndrome of right wrist 12/23/2016  . Hamstring tightness of right lower extremity 12/23/2016  . Carpal tunnel syndrome, right upper limb 06/07/2016  . Thumb pain 05/17/2016  . Paresthesia 01/20/2016  . CMC (carpometacarpal) synovitis 01/05/2016  . Medial meniscus tear 10/15/2014  . Left knee pain 09/24/2014  . Neck muscle  strain 03/11/2014  . Elevated PSA 06/11/2013  . Hx of balanitis 06/11/2013  . Knee pain, bilateral 02/09/2013  . Cough 02/09/2013  . Dental infection 06/15/2012  . Dysuria 12/31/2011  . Bladder neck obstruction 10/25/2011  . Rash 07/16/2011  . Urethritis 06/01/2011  . Headache(784.0) 04/05/2011  . Abdominal pain 04/05/2011  . NECK PAIN 10/19/2010  . DYSPLASTIC NEVUS, CHEST 03/23/2010  . Eczema 03/23/2010  . HYDROCELE, RIGHT 03/03/2010  . Nocturia 03/03/2010  . Personal history of colonic polyps 03/03/2010  . RECTAL BLEEDING 01/27/2010  . Unspecified disorder of male genital organs 01/27/2010  . NONSPECIFIC ABN FINDING RAD & OTH EXAM GU ORGAN 01/27/2010  . HEMATOCHEZIA 01/23/2010  . BENIGN PROSTATIC HYPERTROPHY 01/23/2010  . HAND PAIN 01/01/2010  . B12 deficiency 06/03/2009  . WRIST PAIN 06/03/2009  . Chest pain on respiration 04/25/2009  . SHOULDER STRAIN, RIGHT 04/25/2009  . FUNGAL DERMATITIS 04/12/2009  . LOW BACK PAIN 07/29/2008  . GERD 04/30/2008  . WEIGHT LOSS, ABNORMAL 04/30/2008  . HEMORRHOIDS 04/23/2008  . SEBACEOUS CYST, INFECTED 04/23/2008  . TENDINITIS, RIGHT ELBOW 10/21/2007   Past Medical History:  Diagnosis Date  . Balanitis   . BPH (benign prostatic hypertrophy)   . DJD (degenerative joint disease) of knee   . Eczema   . GERD (gastroesophageal reflux disease)   . Hemorrhoid   .  Low back pain   . Medial meniscus tear    left  . Patellofemoral arthralgia of left knee   . Personal history of colonic polyps 03/03/2010  . Vitamin B12 deficiency     Family History  Problem Relation Age of Onset  . Hypertension Mother   . Diabetes Mother   . Hypertension Father   . Diabetes Father   . Colon cancer Neg Hx   . Rectal cancer Neg Hx   . Stomach cancer Neg Hx     Past Surgical History:  Procedure Laterality Date  . CARPAL TUNNEL RELEASE Right 04/11/2018   Procedure: RIGHT OPEN CARPAL TUNNEL RELEASE;  Surgeon: Jessy Oto, MD;  Location: [place];  Service: Orthopedics;  Laterality: Right;  . CIRCUMCISION N/A 07/20/2013   Procedure: CIRCUMCISION ADULT;  Surgeon: Claybon Jabs, MD;  Location: [facility];  Service: Urology;  Laterality: N/A;  . COLONOSCOPY    . CYSTO REMOVAL RIGHT THUMB  2006  . RIGHT HYDROCELECTOMY  11-13-2010   Social History   Occupational History  . Occupation: Programmer, applications: RETIRED  Tobacco Use  . Smoking status: Never Smoker  . Smokeless tobacco: Never Used  Substance and Sexual Activity  . Alcohol use: No  . Drug use: No  . Sexual activity: Yes

## 2018-07-27 ENCOUNTER — Ambulatory Visit: Payer: Federal, State, Local not specified - PPO | Admitting: Occupational Therapy

## 2018-07-27 ENCOUNTER — Telehealth: Payer: Self-pay

## 2018-07-27 NOTE — Telephone Encounter (Signed)
Called and spoke with patient d/t pt having to cancel today's O.T. Appointment (d/t [facility] appointment) for follow up on MD appointment and recommendations. Pt has new referral for O.T. (in Epic) w/ new diagnoses, therefore will plan on doing renewal/recertification when patient returns. Pt in agreement.

## 2018-08-04 ENCOUNTER — Ambulatory Visit: Payer: Federal, State, Local not specified - PPO

## 2018-08-04 ENCOUNTER — Telehealth (INDEPENDENT_AMBULATORY_CARE_PROVIDER_SITE_OTHER): Payer: Self-pay | Admitting: Specialist

## 2018-08-04 NOTE — Telephone Encounter (Signed)
I called and spoke with patient he states that the [place] ordered and done the MRI of his knee, he states that he saw a Orthopedic Dr. At the [facility] and they want him to see an orthopedic surgeon, so he wants to bring the CD by the office for Dr. Louanne Skye to review the MRI and report.  He does have an appt in about 4 weeks.

## 2018-08-04 NOTE — Telephone Encounter (Signed)
Patient said he would like a call back from you regarding MRI results patients # [phone]

## 2018-08-07 DIAGNOSIS — M545 Low back pain: Secondary | ICD-10-CM | POA: Diagnosis not present

## 2018-08-08 ENCOUNTER — Ambulatory Visit: Payer: Federal, State, Local not specified - PPO

## 2018-08-10 ENCOUNTER — Ambulatory Visit: Payer: Federal, State, Local not specified - PPO | Attending: Specialist | Admitting: Occupational Therapy

## 2018-08-10 DIAGNOSIS — M6281 Muscle weakness (generalized): Secondary | ICD-10-CM

## 2018-08-10 DIAGNOSIS — M25531 Pain in right wrist: Secondary | ICD-10-CM | POA: Insufficient documentation

## 2018-08-10 DIAGNOSIS — R208 Other disturbances of skin sensation: Secondary | ICD-10-CM | POA: Diagnosis not present

## 2018-08-10 DIAGNOSIS — R600 Localized edema: Secondary | ICD-10-CM | POA: Diagnosis not present

## 2018-08-10 DIAGNOSIS — R278 Other lack of coordination: Secondary | ICD-10-CM | POA: Diagnosis not present

## 2018-08-10 DIAGNOSIS — M25521 Pain in right elbow: Secondary | ICD-10-CM | POA: Insufficient documentation

## 2018-08-10 NOTE — Therapy (Signed)
[facility] [address] [place], [place], [address] Phone: [phone]   Fax:  [phone]  Occupational Therapy Re-evaluation  Patient Details  Name: Jorge Turner MRN: [id number] Date of Birth: 1956-10-02 Referring Provider: Dr. Louanne Skye   Encounter Date: 08/10/2018  OT End of Session - 08/10/18 0935    Visit Number  12    Number of Visits  24    Date for OT Re-Evaluation  09/27/18    Authorization Type   BC/BS Federal (23 visits used previously this year out of 54 combined PT/OT/Speech)    Authorization - Visit Number  29    Authorization - Number of Visits  110    OT Start Time  0850    OT Stop Time  0935    OT Time Calculation (min)  45 min    Activity Tolerance  Patient tolerated treatment well    Behavior During Therapy  [facility] for tasks assessed/performed       Past Medical History:  Diagnosis Date  . Balanitis   . BPH (benign prostatic hypertrophy)   . DJD (degenerative joint disease) of knee   . Eczema   . GERD (gastroesophageal reflux disease)   . Hemorrhoid   . Low back pain   . Medial meniscus tear    left  . Patellofemoral arthralgia of left knee   . Personal history of colonic polyps 03/03/2010  . Vitamin B12 deficiency     Past Surgical History:  Procedure Laterality Date  . CARPAL TUNNEL RELEASE Right 04/11/2018   Procedure: RIGHT OPEN CARPAL TUNNEL RELEASE;  Surgeon: Jessy Oto, MD;  Location: [place];  Service: Orthopedics;  Laterality: Right;  . CIRCUMCISION N/A 07/20/2013   Procedure: CIRCUMCISION ADULT;  Surgeon: Claybon Jabs, MD;  Location: [facility];  Service: Urology;  Laterality: N/A;  . COLONOSCOPY    . CYSTO REMOVAL RIGHT THUMB  2006  . RIGHT HYDROCELECTOMY  11-13-2010    There were no vitals filed for this visit.  Subjective Assessment - 08/10/18 0858    Pertinent History  New diagnosis: medial epicondylitis, DeQuervains, Rt CTR 04/11/18, Rt  olecranon bursitis. PMH: DJD both knees, Lt knee surgery 2016, low back pain, Rt wrist fx (1978)    Patient Stated Goals  decrease pain    Currently in Pain?  Yes    Pain Score  6    elbow 4/10    Pain Location  Wrist    Pain Orientation  Right    Pain Descriptors / Indicators  Sore;Aching;Tender    Pain Onset  More than a month ago    Pain Frequency  Constant    Aggravating Factors   use    Pain Relieving Factors  rest        [facility] OT Assessment - 08/10/18 0001      Assessment   Medical Diagnosis  medial epicondylitis, DeQuervains tenosynovitis   Pt also w/ CTS and OA at wrist from old Colle's fx   Referring Provider  Dr. Louanne Skye    Onset Date/Surgical Date  04/11/18   for previous Rt CTR   Hand Dominance  Right    Next MD Visit  3 WEEKS      ADL   ADL comments  Pt reports extreme difficulty/extra time to open a jar or twist off container, and has to use Lt hand to support when lifting something heavy w/ Rt hand      Observation/Other Assessments  Observations  Pt returns today with new diagnoses including DeQuervains tenosynovitis and medial epicondylitis. Pt also has CTS however therapist addressed this previously. Will focus this recertification/renewal on the DeQuervains and medial epicondylitis.       Coordination   9 Hole Peg Test  Right;Left    Right 9 Hole Peg Test  30.37 sec    Left 9 Hole Peg Test  20.34 sec      Hand Function   Right Hand Grip (lbs)  64 LBS    Right Hand Lateral Pinch  17 lbs    Right Hand 3 Point Pinch  10 lbs    Left Hand Grip (lbs)  93 LBS    Left Hand Lateral Pinch  22 lbs    Left 3 point pinch  18 lbs               OT Treatments/Exercises (OP) - 08/10/18 0001      ADLs   ADL Comments  Discussed POC and new goals for this recertification. Re-cert Sharyn Creamer completed today and charged under self care      Splinting   Splinting  Pt issued neoprene thumb spica splint for night time and for aggravating activities during the  day (lifting, gripping tasks). Pt also shown thumb spica splint w/ metal stays and will issue if neoprene splint is not helping              OT Short Term Goals - 08/10/18 0936      OT SHORT TERM GOAL #1   Title  Independent with splint wear and care for DeQuervains tenosynovitis    Time  3    Period  Weeks    Status  On-going      OT SHORT TERM GOAL #2   Title  Independent with initial HEP for DeQuervains and medial epicondylitis    Time  3    Period  Weeks    Status  New      OT SHORT TERM GOAL #3   Title  Pain at Rt wrist to be 4/10 or under with functional tasks    Baseline  6/10    Time  3    Period  Weeks    Status  New      OT SHORT TERM GOAL #4   Title  Pt at Rt elbow to be 2/10 or under with functional tasks    Baseline  4/10    Time  3    Period  Weeks    Status  New        OT Long Term Goals - 08/10/18 0630      OT LONG TERM GOAL #1   Title  Independent with updated HEP for strengthening - 09/27/18    Time  6    Period  Weeks    Status  New      OT LONG TERM GOAL #2   Title  Pt to improve Rt grip strength by 10 lbs or more for gripping/lifting tasks     Baseline  64 lbs    Time  6    Period  Weeks    Status  New      OT LONG TERM GOAL #3   Title  Pt to improve Rt hand lateral and 3 tip pinch strength each by 3 lbs or greater     Baseline  Lat = 17 (Lt = 22), 3 Tip = 10 (Lt = 18)     Time  6  Period  Weeks    Status  New      OT LONG TERM GOAL #4   Title  Pt to improve coordination Rt hand as evidenced by reducing speed on 9 hole peg test to 25 sec. or less    Baseline  30.37 sec    Time  6    Period  Weeks    Status  New      OT LONG TERM GOAL #5   Title  Pt to report greater ease and less overall pain with gripping, lifting, and twisting tasks    Time  6    Period  Weeks    Status  New            Plan - 08/10/18 8182    Clinical Impression Statement  Pt returns today for recertification and renewal due to new  diagnoses: Rt DeQuervains tenosynovitis, and Rt elbow medial epicondylitis (see new referral in EPIC). MD also wants iontophoresis for Rt wrist at 1st dorsal compartment of thumb and splint for thumb/wrist. Pt also has CTS however addressed previously s/p CTR w/ initial referral.     Occupational Profile and client history currently impacting functional performance  PMH: Rt CTS, DJD, Rt wrist fracture (Colles fx) w/ OA, Rt CTR 04/11/18    Occupational performance deficits (Please refer to evaluation for details):  ADL's;IADL's;Work;Leisure    Rehab Potential  Good    OT Frequency  2x / week    OT Duration  6 weeks    OT Treatment/Interventions  Self-care/ADL training;Moist Heat;Fluidtherapy;DME and/or AE instruction;Splinting;Compression bandaging;Therapeutic activities;Contrast Bath;Ultrasound;Scar mobilization;Iontophoresis;Passive range of motion;Electrical Stimulation;Paraffin;Manual Therapy;Patient/family education    Plan  iontophoresis at base of thumb, possible iontophoresis patch at medial proximal elbow?, initial ex's/stretches for DeQuervains, and stretches for medial epicondylitis    Clinical Decision Making  Several treatment options, min-mod task modification necessary    Consulted and Agree with Plan of Care  Patient       Patient will benefit from skilled therapeutic intervention in order to improve the following deficits and impairments:  Decreased range of motion, Increased edema, Impaired sensation, Impaired UE functional use, Pain, Decreased scar mobility, Decreased strength  Visit Diagnosis: Pain in right wrist - Plan: Ot plan of care cert/re-cert  Pain in right elbow - Plan: Ot plan of care cert/re-cert  Muscle weakness (generalized) - Plan: Ot plan of care cert/re-cert  Other disturbances of skin sensation - Plan: Ot plan of care cert/re-cert  Other lack of coordination - Plan: Ot plan of care cert/re-cert    Problem List Patient Active Problem List   Diagnosis  Date Noted  . Well adult exam 04/03/2018  . Patella-femoral syndrome 10/11/2017  . RTI (respiratory tract infection) 07/19/2017  . Right hand weakness 03/07/2017  . Extensor intersection syndrome of right wrist 12/23/2016  . Hamstring tightness of right lower extremity 12/23/2016  . Carpal tunnel syndrome, right upper limb 06/07/2016  . Thumb pain 05/17/2016  . Paresthesia 01/20/2016  . CMC (carpometacarpal) synovitis 01/05/2016  . Medial meniscus tear 10/15/2014  . Left knee pain 09/24/2014  . Neck muscle strain 03/11/2014  . Elevated PSA 06/11/2013  . Hx of balanitis 06/11/2013  . Knee pain, bilateral 02/09/2013  . Cough 02/09/2013  . Dental infection 06/15/2012  . Dysuria 12/31/2011  . Bladder neck obstruction 10/25/2011  . Rash 07/16/2011  . Urethritis 06/01/2011  . Headache(784.0) 04/05/2011  . Abdominal pain 04/05/2011  . NECK PAIN 10/19/2010  . DYSPLASTIC NEVUS, CHEST 03/23/2010  .  Eczema 03/23/2010  . HYDROCELE, RIGHT 03/03/2010  . Nocturia 03/03/2010  . Personal history of colonic polyps 03/03/2010  . RECTAL BLEEDING 01/27/2010  . Unspecified disorder of male genital organs 01/27/2010  . NONSPECIFIC ABN FINDING RAD & OTH EXAM GU ORGAN 01/27/2010  . HEMATOCHEZIA 01/23/2010  . BENIGN PROSTATIC HYPERTROPHY 01/23/2010  . HAND PAIN 01/01/2010  . B12 deficiency 06/03/2009  . WRIST PAIN 06/03/2009  . Chest pain on respiration 04/25/2009  . SHOULDER STRAIN, RIGHT 04/25/2009  . FUNGAL DERMATITIS 04/12/2009  . LOW BACK PAIN 07/29/2008  . GERD 04/30/2008  . WEIGHT LOSS, ABNORMAL 04/30/2008  . HEMORRHOIDS 04/23/2008  . SEBACEOUS CYST, INFECTED 04/23/2008  . TENDINITIS, RIGHT ELBOW 10/21/2007    Carey Bullocks, OTR/L 08/10/2018, 9:54 AM  [facility] [address] [place], [place], [address] Phone: [phone]   Fax:  [phone]  Name: Jorge Turner MRN: [id number] Date of Birth: 1956/04/27

## 2018-08-15 ENCOUNTER — Encounter: Payer: Federal, State, Local not specified - PPO | Admitting: Occupational Therapy

## 2018-08-15 ENCOUNTER — Ambulatory Visit (INDEPENDENT_AMBULATORY_CARE_PROVIDER_SITE_OTHER): Payer: Federal, State, Local not specified - PPO

## 2018-08-15 ENCOUNTER — Encounter (INDEPENDENT_AMBULATORY_CARE_PROVIDER_SITE_OTHER): Payer: Self-pay | Admitting: Specialist

## 2018-08-15 ENCOUNTER — Ambulatory Visit (INDEPENDENT_AMBULATORY_CARE_PROVIDER_SITE_OTHER): Payer: Federal, State, Local not specified - PPO | Admitting: Specialist

## 2018-08-15 VITALS — BP 105/64 | HR 62 | Ht 70.0 in | Wt 165.0 lb

## 2018-08-15 DIAGNOSIS — Z299 Encounter for prophylactic measures, unspecified: Secondary | ICD-10-CM | POA: Diagnosis not present

## 2018-08-15 DIAGNOSIS — S83242D Other tear of medial meniscus, current injury, left knee, subsequent encounter: Secondary | ICD-10-CM | POA: Diagnosis not present

## 2018-08-15 DIAGNOSIS — M170 Bilateral primary osteoarthritis of knee: Secondary | ICD-10-CM | POA: Diagnosis not present

## 2018-08-15 DIAGNOSIS — S83241S Other tear of medial meniscus, current injury, right knee, sequela: Secondary | ICD-10-CM | POA: Diagnosis not present

## 2018-08-15 NOTE — Progress Notes (Addendum)
Office Visit Note   Patient: Jorge Turner           Date of Birth: 11/11/1956           MRN: [id number] Visit Date: 08/15/2018              Requested by: [name], MD [place], [place] [id number] PCP: [name], MD   Assessment & Plan: Visit Diagnoses:  1. Osteoarthritis of patellofemoral joints of both knees   2. Other tear of medial meniscus, current injury, left knee, subsequent encounter   3. Other tear of medial meniscus of right knee as current injury, sequela     Plan:  Knee is suffering from osteoarthritis, only real proven treatments are Weight loss, NSIADs like diclofenac and exercise. Well padded shoes help. Ice the knee 2-3 times a day 15-20 mins at a time. Extension exercises of the patellofemoral joint.  Appt with Dr. Marlou Sa to assess medial meniscus tears.   Follow-Up Instructions: Return in about 3 weeks (around 09/05/2018).   Orders:  No orders of the defined types were placed in this encounter.  No orders of the defined types were placed in this encounter.     Procedures: No procedures performed   Clinical Data: Findings:  MRI of the knees reviewed show left knee with medial and posterior medial meniscus para meniscal cysts, large posterior to posterior cruciate ligament ganglion cyst, full thicknesss loss of cartilage along the medial femoral facet portion of the patellofemoral joint line.  Right knee with more extensive central patellofemoral subchondral cysts and full thickness cartilage loss. Bursitis note along the semimembranous-tibial collateral ligament. There is a obliquely oriented inferior surface  Meniscal tear involving the posterio horn of the medial meniscus. Small para meniscal cysts noted.     Subjective: Chief Complaint  Patient presents with  . Right Arm - Follow-up    62 year old male with history of bilateral knee pain and pain over the anterior distal thighs. He requested a visit to go over the  results of the MRIs done at the [facility] in [place]. He has a history of previous left knee arthroscopy by Dr. Latanya Maudlin in 2015. The prior to surgery MRIs are available for review and show a left posterior horn horizonal cleavage meniscal tear, it is not displaced. He also requested review of a lumbar MRI performed at the [facility].   Review of Systems  Constitutional: Negative.   HENT: Negative.   Eyes: Negative.   Respiratory: Negative.   Cardiovascular: Negative.   Gastrointestinal: Negative.   Endocrine: Negative.   Genitourinary: Negative.   Musculoskeletal: Negative.   Skin: Negative.   Allergic/Immunologic: Negative.   Neurological: Negative.   Hematological: Negative.   Psychiatric/Behavioral: Negative.      Objective: Vital Signs: BP 105/64 (BP Location: Left Arm, Patient Position: Sitting)   Pulse 62   Ht 5\' 10"  (1.778 m)   Wt 165 lb (74.8 kg)   BMI 23.68 kg/m   Physical Exam  Constitutional: He is oriented to person, place, and time. He appears well-developed and well-nourished.  HENT:  Head: Normocephalic and atraumatic.  Eyes: Pupils are equal, round, and reactive to light. EOM are normal.  Neck: Normal range of motion. Neck supple.  Pulmonary/Chest: Effort normal and breath sounds normal.  Abdominal: Soft. Bowel sounds are normal.  Musculoskeletal: Normal range of motion.       Right knee: He exhibits no effusion.  Left knee: He exhibits no effusion.  Neurological: He is alert and oriented to person, place, and time.  Skin: Skin is warm and dry.  Psychiatric: He has a normal mood and affect. His behavior is normal. Judgment and thought content normal.    Right Knee Exam  Right knee exam is normal.  Tenderness  The patient is experiencing no tenderness.   Range of Motion  Extension:  0 normal  Flexion: 140   Tests  McMurray:  Medial - negative Lateral - negative Varus: negative Valgus: negative Lachman:  Anterior - negative     Posterior - negative Pivot shift: negative  Other  Erythema: absent Scars: absent Sensation: normal Pulse: present Swelling: none Effusion: no effusion present   Left Knee Exam   Tenderness  The patient is experiencing tenderness in the patella.  Range of Motion  Extension: 0  Flexion: 140   Tests  McMurray:  Medial - negative Lateral - negative Varus: negative Valgus: negative Lachman:  Anterior - negative    Posterior - negative Pivot shift: negative  Other  Erythema: absent Scars: present Sensation: normal Pulse: present Swelling: none Effusion: no effusion present   Back Exam   Tenderness  The patient is experiencing tenderness in the lumbar.  Muscle Strength  Right Quadriceps:  5/5  Left Quadriceps:  5/5  Right Hamstrings:  5/5  Left Hamstrings:  5/5   Tests  Straight leg raise right: negative Straight leg raise left: negative  Other  Toe walk: normal Heel walk: normal Sensation: normal Gait: normal   Comments:  Cervical spine with decreased right lateral bending and rotation. Mild positive spurling sign with extension and lateral rotation of the cervical spine to the right.       Specialty Comments:  No specialty comments available.  Imaging: No results found.   PMFS History: Patient Active Problem List   Diagnosis Date Noted  . Well adult exam 04/03/2018  . Patella-femoral syndrome 10/11/2017  . RTI (respiratory tract infection) 07/19/2017  . Right hand weakness 03/07/2017  . Extensor intersection syndrome of right wrist 12/23/2016  . Hamstring tightness of right lower extremity 12/23/2016  . Carpal tunnel syndrome, right upper limb 06/07/2016  . Thumb pain 05/17/2016  . Paresthesia 01/20/2016  . CMC (carpometacarpal) synovitis 01/05/2016  . Medial meniscus tear 10/15/2014  . Left knee pain 09/24/2014  . Neck muscle strain 03/11/2014  . Elevated PSA 06/11/2013  . Hx of balanitis 06/11/2013  . Knee pain, bilateral  02/09/2013  . Cough 02/09/2013  . Dental infection 06/15/2012  . Dysuria 12/31/2011  . Bladder neck obstruction 10/25/2011  . Rash 07/16/2011  . Urethritis 06/01/2011  . Headache(784.0) 04/05/2011  . Abdominal pain 04/05/2011  . NECK PAIN 10/19/2010  . DYSPLASTIC NEVUS, CHEST 03/23/2010  . Eczema 03/23/2010  . HYDROCELE, RIGHT 03/03/2010  . Nocturia 03/03/2010  . Personal history of colonic polyps 03/03/2010  . RECTAL BLEEDING 01/27/2010  . Unspecified disorder of male genital organs 01/27/2010  . NONSPECIFIC ABN FINDING RAD & OTH EXAM GU ORGAN 01/27/2010  . HEMATOCHEZIA 01/23/2010  . BENIGN PROSTATIC HYPERTROPHY 01/23/2010  . HAND PAIN 01/01/2010  . B12 deficiency 06/03/2009  . WRIST PAIN 06/03/2009  . Chest pain on respiration 04/25/2009  . SHOULDER STRAIN, RIGHT 04/25/2009  . FUNGAL DERMATITIS 04/12/2009  . LOW BACK PAIN 07/29/2008  . GERD 04/30/2008  . WEIGHT LOSS, ABNORMAL 04/30/2008  . HEMORRHOIDS 04/23/2008  . SEBACEOUS CYST, INFECTED 04/23/2008  . TENDINITIS, RIGHT ELBOW 10/21/2007   Past Medical  History:  Diagnosis Date  . Balanitis   . BPH (benign prostatic hypertrophy)   . DJD (degenerative joint disease) of knee   . Eczema   . GERD (gastroesophageal reflux disease)   . Hemorrhoid   . Low back pain   . Medial meniscus tear    left  . Patellofemoral arthralgia of left knee   . Personal history of colonic polyps 03/03/2010  . Vitamin B12 deficiency     Family History  Problem Relation Age of Onset  . Hypertension Mother   . Diabetes Mother   . Hypertension Father   . Diabetes Father   . Colon cancer Neg Hx   . Rectal cancer Neg Hx   . Stomach cancer Neg Hx     Past Surgical History:  Procedure Laterality Date  . CARPAL TUNNEL RELEASE Right 04/11/2018   Procedure: RIGHT OPEN CARPAL TUNNEL RELEASE;  Surgeon: Jessy Oto, MD;  Location: [facility];  Service: Orthopedics;  Laterality: Right;  . CIRCUMCISION N/A 07/20/2013   Procedure:  CIRCUMCISION ADULT;  Surgeon: Claybon Jabs, MD;  Location[facility];  Service: Urology;  Laterality: N/A;  . COLONOSCOPY    . CYSTO REMOVAL RIGHT THUMB  2006  . RIGHT HYDROCELECTOMY  11-13-2010   Social History   Occupational History  . Occupation: Programmer, applications: RETIRED  Tobacco Use  . Smoking status: Never Smoker  . Smokeless tobacco: Never Used  Substance and Sexual Activity  . Alcohol use: No  . Drug use: No  . Sexual activity: Yes

## 2018-08-15 NOTE — Patient Instructions (Addendum)
  Knee is suffering from osteoarthritis, only real proven treatments are Weight loss, NSIADs like diclofenac and exercise. Well padded shoes help. Ice the knee 2-3 times a day 15-20 mins at a time. Extension exercises of the patellofemoral joint.  Appt with Dr. Marlou Sa to assess medial meniscus tears.

## 2018-08-17 ENCOUNTER — Ambulatory Visit: Payer: Federal, State, Local not specified - PPO | Admitting: Occupational Therapy

## 2018-08-20 ENCOUNTER — Other Ambulatory Visit (INDEPENDENT_AMBULATORY_CARE_PROVIDER_SITE_OTHER): Payer: Self-pay | Admitting: Specialist

## 2018-08-21 NOTE — Telephone Encounter (Signed)
Diclofenac Gel 1 % refill request

## 2018-08-22 ENCOUNTER — Ambulatory Visit: Payer: Federal, State, Local not specified - PPO | Admitting: Occupational Therapy

## 2018-08-22 DIAGNOSIS — M25521 Pain in right elbow: Secondary | ICD-10-CM

## 2018-08-22 DIAGNOSIS — R600 Localized edema: Secondary | ICD-10-CM

## 2018-08-22 DIAGNOSIS — M6281 Muscle weakness (generalized): Secondary | ICD-10-CM

## 2018-08-22 DIAGNOSIS — R208 Other disturbances of skin sensation: Secondary | ICD-10-CM | POA: Diagnosis not present

## 2018-08-22 DIAGNOSIS — R278 Other lack of coordination: Secondary | ICD-10-CM | POA: Diagnosis not present

## 2018-08-22 DIAGNOSIS — M25531 Pain in right wrist: Secondary | ICD-10-CM | POA: Diagnosis not present

## 2018-08-22 NOTE — Patient Instructions (Addendum)
  FOR DEQUERVAINS TENOSYNOVITIS:   1) Keep wrist neutral with thumb side up, move thumb gently across palm (Like a "4") then gently up in a hitch hikers position (like a "5"). Repeat 10 times w/ wrist supported. Do 4 sessions per day  2) Turn palm down over edge of pillow or table, keep thumb relaxed, move wrist up and down x 10 reps each. Do 4 sessions per day  3) Place hand flat on table, gently move wrist side to side while keeping thumb relaxed, x 10 reps each way. Do 4 sessions per day  FOR ELOW:   1) Hot pack to elbow x 10 minutes prior to massage and stretches. Do NOT use heat before or 2 hours after iontophoresis  2) Perform massage to inside upper portion of forearm near elbow joint: clockwise, counterclockwise, along the length of muscles, and perpendicular to muscles for 5 minute sessions 3-4 times per day.   3) Stretches for elbow:  Elbow bent, thumb side up, move wrist back and hold for 15 sec. Repeat 10 times, 4 times per day Elbow bent, palm up, move wrist down and hold for 15 sec. Repeat 10 times, 4 times per day  **Wear elbow brace during day. Wear wrist brace during more aggravating activities

## 2018-08-22 NOTE — Therapy (Signed)
[facility] [address] [place], [place], [address] Phone: [phone]   Fax:  [phone]  Occupational Therapy Treatment  Patient Details  Name: Jorge Turner MRN: [id number] Date of Birth: 1956/05/27 Referring Provider: Dr. Louanne Skye   Encounter Date: 08/22/2018  OT End of Session - 08/22/18 0949    Visit Number  13    Number of Visits  24    Date for OT Re-Evaluation  09/27/18    Authorization Type   [employer] (23 visits used previously this year out of 36 combined PT/OT/Speech)    Authorization - Visit Number  45    Authorization - Number of Visits  74    OT Start Time  0855    OT Stop Time  0955    OT Time Calculation (min)  60 min    Activity Tolerance  Patient tolerated treatment well    Behavior During Therapy  [facility] for tasks assessed/performed       Past Medical History:  Diagnosis Date  . Balanitis   . BPH (benign prostatic hypertrophy)   . DJD (degenerative joint disease) of knee   . Eczema   . GERD (gastroesophageal reflux disease)   . Hemorrhoid   . Low back pain   . Medial meniscus tear    left  . Patellofemoral arthralgia of left knee   . Personal history of colonic polyps 03/03/2010  . Vitamin B12 deficiency     Past Surgical History:  Procedure Laterality Date  . CARPAL TUNNEL RELEASE Right 04/11/2018   Procedure: RIGHT OPEN CARPAL TUNNEL RELEASE;  Surgeon: Jessy Oto, MD;  Location: [place];  Service: Orthopedics;  Laterality: Right;  . CIRCUMCISION N/A 07/20/2013   Procedure: CIRCUMCISION ADULT;  Surgeon: Claybon Jabs, MD;  Location: [facility];  Service: Urology;  Laterality: N/A;  . COLONOSCOPY    . CYSTO REMOVAL RIGHT THUMB  2006  . RIGHT HYDROCELECTOMY  11-13-2010    There were no vitals filed for this visit.  Subjective Assessment - 08/22/18 0858    Pertinent History  New diagnosis: medial epicondylitis, DeQuervains, Rt CTR 04/11/18, Rt  olecranon bursitis. PMH: DJD both knees, Lt knee surgery 2016, low back pain, Rt wrist fx (1978)    Patient Stated Goals  decrease pain    Currently in Pain?  Yes    Pain Score  7     Pain Orientation  Right    Pain Descriptors / Indicators  Aching;Sore;Tender    Pain Type  Acute pain    Pain Onset  More than a month ago    Pain Frequency  Constant    Aggravating Factors   use    Pain Relieving Factors  rest         [facility] OT Assessment - 08/22/18 0001      Observation/Other Assessments   Observations  Pt w/ noted mild to moderate edema Rt hand/wrist               OT Treatments/Exercises (OP) - 08/22/18 0001      ADLs   ADL Comments  Pt shown how to apply heat, and perform massage to medial epicondyle area at pronator and wrist flexor musculature (proximal attachments).       Wrist Exercises   Other wrist exercises  see HEP for wrist/thumb ex's and stretches for elbow      Iontophoresis   Type of Iontophoresis  Dexamethasone (dose #1 for DeQuervains)  Location  radial wrist/base of thumb    Dose  1.5 cc dexamethasone, 2.2 mA    Time  18 min. on with 5 minutes set up/application = 23 mintues total      Splinting   Splinting  Pt issued counterforce elbow brace to apply to medial epicondyle. Also reviewed wear and care of thumb spica splint but instructed to adjust metal stay at wrist to neutral             OT Education - 08/22/18 0925    Education Details  HEP for DeQuervains and medial epicondylitis, wear and care of counterforce brace for medial epicondylitis and review of thumb spica brace     Person(s) Educated  Patient    Methods  Explanation;Demonstration;Handout    Comprehension  Verbalized understanding;Returned demonstration       OT Short Term Goals - 08/22/18 0950      OT SHORT TERM GOAL #1   Title  Independent with splint wear and care for DeQuervains tenosynovitis    Time  3    Period  Weeks    Status  On-going      OT SHORT TERM GOAL  #2   Title  Independent with initial HEP for DeQuervains and medial epicondylitis    Time  3    Period  Weeks    Status  On-going      OT SHORT TERM GOAL #3   Title  Pain at Rt wrist to be 4/10 or under with functional tasks    Baseline  6/10    Time  3    Period  Weeks    Status  New      OT SHORT TERM GOAL #4   Title  Pt at Rt elbow to be 2/10 or under with functional tasks    Baseline  4/10    Time  3    Period  Weeks    Status  New        OT Long Term Goals - 08/10/18 7412      OT LONG TERM GOAL #1   Title  Independent with updated HEP for strengthening - 09/27/18    Time  6    Period  Weeks    Status  New      OT LONG TERM GOAL #2   Title  Pt to improve Rt grip strength by 10 lbs or more for gripping/lifting tasks     Baseline  64 lbs    Time  6    Period  Weeks    Status  New      OT LONG TERM GOAL #3   Title  Pt to improve Rt hand lateral and 3 tip pinch strength each by 3 lbs or greater     Baseline  Lat = 17 (Lt = 22), 3 Tip = 10 (Lt = 18)     Time  6    Period  Weeks    Status  New      OT LONG TERM GOAL #4   Title  Pt to improve coordination Rt hand as evidenced by reducing speed on 9 hole peg test to 25 sec. or less    Baseline  30.37 sec    Time  6    Period  Weeks    Status  New      OT LONG TERM GOAL #5   Title  Pt to report greater ease and less overall pain with gripping, lifting, and twisting tasks  Time  6    Period  Weeks    Status  New            Plan - 08/22/18 0951    Clinical Impression Statement  Pt progressing towards STG's. Pt tolerating iontophoresis today    Occupational Profile and client history currently impacting functional performance  PMH: Rt CTS, DJD, Rt wrist fracture ([place] fx) w/ OA, Rt CTR 04/11/18    Occupational performance deficits (Please refer to evaluation for details):  ADL's;IADL's;Work;Leisure    Rehab Potential  Good    Current Impairments/barriers affecting progress:  time since onset of surgery     OT Frequency  2x / week    OT Duration  6 weeks    OT Treatment/Interventions  Self-care/ADL training;Moist Heat;Fluidtherapy;DME and/or AE instruction;Splinting;Compression bandaging;Therapeutic activities;Contrast Bath;Ultrasound;Scar mobilization;Iontophoresis;Passive range of motion;Electrical Stimulation;Paraffin;Manual Therapy;Patient/family education    Plan  continue ionto dose #2 to base of thumb, review HEP/Stretches    Consulted and Agree with Plan of Care  Patient       Patient will benefit from skilled therapeutic intervention in order to improve the following deficits and impairments:  Decreased range of motion, Increased edema, Impaired sensation, Impaired UE functional use, Pain, Decreased scar mobility, Decreased strength  Visit Diagnosis: Pain in right wrist  Pain in right elbow  Muscle weakness (generalized)  Localized edema    Problem List Patient Active Problem List   Diagnosis Date Noted  . Well adult exam 04/03/2018  . Patella-femoral syndrome 10/11/2017  . RTI (respiratory tract infection) 07/19/2017  . Right hand weakness 03/07/2017  . Extensor intersection syndrome of right wrist 12/23/2016  . Hamstring tightness of right lower extremity 12/23/2016  . Carpal tunnel syndrome, right upper limb 06/07/2016  . Thumb pain 05/17/2016  . Paresthesia 01/20/2016  . CMC (carpometacarpal) synovitis 01/05/2016  . Medial meniscus tear 10/15/2014  . Left knee pain 09/24/2014  . Neck muscle strain 03/11/2014  . Elevated PSA 06/11/2013  . Hx of balanitis 06/11/2013  . Knee pain, bilateral 02/09/2013  . Cough 02/09/2013  . Dental infection 06/15/2012  . Dysuria 12/31/2011  . Bladder neck obstruction 10/25/2011  . Rash 07/16/2011  . Urethritis 06/01/2011  . Headache(784.0) 04/05/2011  . Abdominal pain 04/05/2011  . NECK PAIN 10/19/2010  . DYSPLASTIC NEVUS, CHEST 03/23/2010  . Eczema 03/23/2010  . HYDROCELE, RIGHT 03/03/2010  . Nocturia 03/03/2010  .  Personal history of colonic polyps 03/03/2010  . RECTAL BLEEDING 01/27/2010  . Unspecified disorder of male genital organs 01/27/2010  . NONSPECIFIC ABN FINDING RAD & OTH EXAM GU ORGAN 01/27/2010  . HEMATOCHEZIA 01/23/2010  . BENIGN PROSTATIC HYPERTROPHY 01/23/2010  . HAND PAIN 01/01/2010  . B12 deficiency 06/03/2009  . WRIST PAIN 06/03/2009  . Chest pain on respiration 04/25/2009  . SHOULDER STRAIN, RIGHT 04/25/2009  . FUNGAL DERMATITIS 04/12/2009  . LOW BACK PAIN 07/29/2008  . GERD 04/30/2008  . WEIGHT LOSS, ABNORMAL 04/30/2008  . HEMORRHOIDS 04/23/2008  . SEBACEOUS CYST, INFECTED 04/23/2008  . TENDINITIS, RIGHT ELBOW 10/21/2007    Carey Bullocks, OTR/L 08/22/2018, 10:00 AM  [facility] [address] [place], [place], [address] Phone: [phone]   Fax:  [phone]  Name: Jorge Turner MRN: [id number] Date of Birth: [birth year]

## 2018-08-23 ENCOUNTER — Ambulatory Visit: Payer: Federal, State, Local not specified - PPO | Admitting: Occupational Therapy

## 2018-08-23 DIAGNOSIS — M25531 Pain in right wrist: Secondary | ICD-10-CM | POA: Diagnosis not present

## 2018-08-23 DIAGNOSIS — M25521 Pain in right elbow: Secondary | ICD-10-CM

## 2018-08-23 DIAGNOSIS — R208 Other disturbances of skin sensation: Secondary | ICD-10-CM | POA: Diagnosis not present

## 2018-08-23 DIAGNOSIS — M6281 Muscle weakness (generalized): Secondary | ICD-10-CM | POA: Diagnosis not present

## 2018-08-23 DIAGNOSIS — R278 Other lack of coordination: Secondary | ICD-10-CM | POA: Diagnosis not present

## 2018-08-23 DIAGNOSIS — R600 Localized edema: Secondary | ICD-10-CM | POA: Diagnosis not present

## 2018-08-23 NOTE — Therapy (Signed)
[facility] [address] [place], [place], [address] Phone: [phone]   Fax:  [phone]  Occupational Therapy Treatment  Patient Details  Name: Jorge Turner MRN: [id number] Date of Birth: 26-Jan-1956 Referring Provider: Dr. Louanne Skye   Encounter Date: 08/23/2018  OT End of Session - 08/23/18 1311    Visit Number  14    Number of Visits  24    Date for OT Re-Evaluation  09/27/18    Authorization Type   BC/BS Federal (23 visits used previously this year out of 34 combined PT/OT/Speech)    Authorization - Visit Number  45    Authorization - Number of Visits  60    OT Start Time  1230    OT Stop Time  1317    OT Time Calculation (min)  47 min    Activity Tolerance  Patient tolerated treatment well       Past Medical History:  Diagnosis Date  . Balanitis   . BPH (benign prostatic hypertrophy)   . DJD (degenerative joint disease) of knee   . Eczema   . GERD (gastroesophageal reflux disease)   . Hemorrhoid   . Low back pain   . Medial meniscus tear    left  . Patellofemoral arthralgia of left knee   . Personal history of colonic polyps 03/03/2010  . Vitamin B12 deficiency     Past Surgical History:  Procedure Laterality Date  . CARPAL TUNNEL RELEASE Right 04/11/2018   Procedure: RIGHT OPEN CARPAL TUNNEL RELEASE;  Surgeon: Jessy Oto, MD;  Location: [facility];  Service: Orthopedics;  Laterality: Right;  . CIRCUMCISION N/A 07/20/2013   Procedure: CIRCUMCISION ADULT;  Surgeon: Claybon Jabs, MD;  Location: [facility];  Service: Urology;  Laterality: N/A;  . COLONOSCOPY    . CYSTO REMOVAL RIGHT THUMB  2006  . RIGHT HYDROCELECTOMY  11-13-2010    There were no vitals filed for this visit.  Subjective Assessment - 08/23/18 1235    Subjective   Both my elbow and wrist feel better today. I think the treatemnt yesterday helped    Pertinent History  New diagnosis: medial epicondylitis,  DeQuervains, Rt CTR 04/11/18, Rt olecranon bursitis. PMH: DJD both knees, Lt knee surgery 2016, low back pain, Rt wrist fx (1978)    Patient Stated Goals  decrease pain    Currently in Pain?  Yes    Pain Score  4     Pain Location  Wrist   and elbow   Pain Orientation  Right    Pain Descriptors / Indicators  Aching;Sore;Tender    Pain Type  Acute pain    Pain Onset  More than a month ago    Pain Frequency  Constant    Aggravating Factors   overuse    Pain Relieving Factors  rest, massage, iontophoresis (for wrist/thumb pain)                    OT Treatments/Exercises (OP) - 08/23/18 0001      Wrist Exercises   Other wrist exercises  wrist flex/ext, and RD/UD x 10 reps each way w/ thumb relaxed (while heat on elbow)      Hand Exercises   Other Hand Exercises  thumb flex and radial abduction w/ wrist neutral (while heat on elbow)       Modalities   Modalities  Moist Heat;Iontophoresis      Moist Heat Therapy   Number  Minutes Moist Heat  10 Minutes    Moist Heat Location  Elbow   at beginning of session while simultaneously performing wrist and thumb ex's     Iontophoresis   Type of Iontophoresis  Dexamethasone   dose #2   Location  radial wrist/base of thumb    Dose  1.5 cc dexamethasone, 2.2 mA    Time  18 min. on with 5 minutes set up/application = 23 mintues total      Manual Therapy   Manual Therapy  Myofascial release;Soft tissue mobilization    Soft tissue mobilization  STM to Rt wrist flexors and pronators at proximal attachment to decrease fascial tightness and improve ROM.     Myofascial Release  myofascial release to wrist flexor and pronator musculature               OT Short Term Goals - 08/23/18 1313      OT SHORT TERM GOAL #1   Title  Independent with splint wear and care for DeQuervains tenosynovitis    Time  3    Period  Weeks    Status  Achieved      OT SHORT TERM GOAL #2   Title  Independent with initial HEP for DeQuervains and  medial epicondylitis    Time  3    Period  Weeks    Status  Achieved      OT SHORT TERM GOAL #3   Title  Pain at Rt wrist to be 4/10 or under with functional tasks    Baseline  6/10    Time  3    Period  Weeks    Status  On-going      OT SHORT TERM GOAL #4   Title  Pt at Rt elbow to be 2/10 or under with functional tasks    Baseline  4/10    Time  3    Period  Weeks    Status  On-going        OT Long Term Goals - 08/10/18 4742      OT LONG TERM GOAL #1   Title  Independent with updated HEP for strengthening - 09/27/18    Time  6    Period  Weeks    Status  New      OT LONG TERM GOAL #2   Title  Pt to improve Rt grip strength by 10 lbs or more for gripping/lifting tasks     Baseline  64 lbs    Time  6    Period  Weeks    Status  New      OT LONG TERM GOAL #3   Title  Pt to improve Rt hand lateral and 3 tip pinch strength each by 3 lbs or greater     Baseline  Lat = 17 (Lt = 22), 3 Tip = 10 (Lt = 18)     Time  6    Period  Weeks    Status  New      OT LONG TERM GOAL #4   Title  Pt to improve coordination Rt hand as evidenced by reducing speed on 9 hole peg test to 25 sec. or less    Baseline  30.37 sec    Time  6    Period  Weeks    Status  New      OT LONG TERM GOAL #5   Title  Pt to report greater ease and less overall pain with gripping, lifting,  and twisting tasks    Time  6    Period  Weeks    Status  New            Plan - 08/23/18 1315    Clinical Impression Statement  Pt has met STG's #1-2 and approximating pain goals. Pt with improved pain today    Occupational Profile and client history currently impacting functional performance  PMH: Rt CTS, DJD, Rt wrist fracture (Colles fx) w/ OA, Rt CTR 04/11/18    Occupational performance deficits (Please refer to evaluation for details):  ADL's;IADL's;Work;Leisure    Rehab Potential  Good    Current Impairments/barriers affecting progress:  time since onset of surgery    OT Frequency  2x / week    OT  Duration  6 weeks    OT Treatment/Interventions  Self-care/ADL training;Moist Heat;Fluidtherapy;DME and/or AE instruction;Splinting;Compression bandaging;Therapeutic activities;Contrast Bath;Ultrasound;Scar mobilization;Iontophoresis;Passive range of motion;Electrical Stimulation;Paraffin;Manual Therapy;Patient/family education    Plan  continue ionto dose #3 to base of thumb, continue stretches/ex's for wrist, thumb, elbow       Patient will benefit from skilled therapeutic intervention in order to improve the following deficits and impairments:  Decreased range of motion, Increased edema, Impaired sensation, Impaired UE functional use, Pain, Decreased scar mobility, Decreased strength  Visit Diagnosis: Pain in right wrist  Pain in right elbow    Problem List Patient Active Problem List   Diagnosis Date Noted  . Well adult exam 04/03/2018  . Patella-femoral syndrome 10/11/2017  . RTI (respiratory tract infection) 07/19/2017  . Right hand weakness 03/07/2017  . Extensor intersection syndrome of right wrist 12/23/2016  . Hamstring tightness of right lower extremity 12/23/2016  . Carpal tunnel syndrome, right upper limb 06/07/2016  . Thumb pain 05/17/2016  . Paresthesia 01/20/2016  . CMC (carpometacarpal) synovitis 01/05/2016  . Medial meniscus tear 10/15/2014  . Left knee pain 09/24/2014  . Neck muscle strain 03/11/2014  . Elevated PSA 06/11/2013  . Hx of balanitis 06/11/2013  . Knee pain, bilateral 02/09/2013  . Cough 02/09/2013  . Dental infection 06/15/2012  . Dysuria 12/31/2011  . Bladder neck obstruction 10/25/2011  . Rash 07/16/2011  . Urethritis 06/01/2011  . Headache(784.0) 04/05/2011  . Abdominal pain 04/05/2011  . NECK PAIN 10/19/2010  . DYSPLASTIC NEVUS, CHEST 03/23/2010  . Eczema 03/23/2010  . HYDROCELE, RIGHT 03/03/2010  . Nocturia 03/03/2010  . Personal history of colonic polyps 03/03/2010  . RECTAL BLEEDING 01/27/2010  . Unspecified disorder of male  genital organs 01/27/2010  . NONSPECIFIC ABN FINDING RAD & OTH EXAM GU ORGAN 01/27/2010  . HEMATOCHEZIA 01/23/2010  . BENIGN PROSTATIC HYPERTROPHY 01/23/2010  . HAND PAIN 01/01/2010  . B12 deficiency 06/03/2009  . WRIST PAIN 06/03/2009  . Chest pain on respiration 04/25/2009  . SHOULDER STRAIN, RIGHT 04/25/2009  . FUNGAL DERMATITIS 04/12/2009  . LOW BACK PAIN 07/29/2008  . GERD 04/30/2008  . WEIGHT LOSS, ABNORMAL 04/30/2008  . HEMORRHOIDS 04/23/2008  . SEBACEOUS CYST, INFECTED 04/23/2008  . TENDINITIS, RIGHT ELBOW 10/21/2007    Carey Bullocks, OTR/L 08/23/2018, 1:16 PM  [facility] [address] [place], [place], [address] Phone: [phone]   Fax:  [phone]  Name: Jorge Turner MRN: [id number] Date of Birth: October 26, 1956

## 2018-08-24 ENCOUNTER — Encounter: Payer: Federal, State, Local not specified - PPO | Admitting: Occupational Therapy

## 2018-08-28 ENCOUNTER — Ambulatory Visit (INDEPENDENT_AMBULATORY_CARE_PROVIDER_SITE_OTHER): Payer: Federal, State, Local not specified - PPO | Admitting: Orthopedic Surgery

## 2018-08-29 ENCOUNTER — Ambulatory Visit: Payer: Federal, State, Local not specified - PPO | Attending: Specialist | Admitting: Occupational Therapy

## 2018-08-29 DIAGNOSIS — R208 Other disturbances of skin sensation: Secondary | ICD-10-CM | POA: Diagnosis not present

## 2018-08-29 DIAGNOSIS — M25521 Pain in right elbow: Secondary | ICD-10-CM | POA: Insufficient documentation

## 2018-08-29 DIAGNOSIS — M25531 Pain in right wrist: Secondary | ICD-10-CM | POA: Diagnosis not present

## 2018-08-29 DIAGNOSIS — M6281 Muscle weakness (generalized): Secondary | ICD-10-CM | POA: Diagnosis not present

## 2018-08-29 NOTE — Therapy (Signed)
[facility] [address] [place], [place], [address] Phone: [phone]   Fax:  [phone]  Occupational Therapy Treatment  Patient Details  Name: Jorge Turner MRN: [id number] Date of Birth: 05/08/1956 No data recorded  Encounter Date: 08/29/2018  OT End of Session - 08/29/18 0920    Visit Number  15    Number of Visits  24    Date for OT Re-Evaluation  09/27/18    Authorization Type   BC/BS Federal (23 visits used previously this year out of 52 combined PT/OT/Speech)    Authorization - Visit Number  75    Authorization - Number of Visits  65    OT Start Time  0845    OT Stop Time  0935    OT Time Calculation (min)  50 min    Activity Tolerance  Patient tolerated treatment well    Behavior During Therapy  [facility] for tasks assessed/performed       Past Medical History:  Diagnosis Date  . Balanitis   . BPH (benign prostatic hypertrophy)   . DJD (degenerative joint disease) of knee   . Eczema   . GERD (gastroesophageal reflux disease)   . Hemorrhoid   . Low back pain   . Medial meniscus tear    left  . Patellofemoral arthralgia of left knee   . Personal history of colonic polyps 03/03/2010  . Vitamin B12 deficiency     Past Surgical History:  Procedure Laterality Date  . CARPAL TUNNEL RELEASE Right 04/11/2018   Procedure: RIGHT OPEN CARPAL TUNNEL RELEASE;  Surgeon: Jessy Oto, MD;  Location: [facility];  Service: Orthopedics;  Laterality: Right;  . CIRCUMCISION N/A 07/20/2013   Procedure: CIRCUMCISION ADULT;  Surgeon: Claybon Jabs, MD;  Location: [facility];  Service: Urology;  Laterality: N/A;  . COLONOSCOPY    . CYSTO REMOVAL RIGHT THUMB  2006  . RIGHT HYDROCELECTOMY  11-13-2010    There were no vitals filed for this visit.  Subjective Assessment - 08/29/18 0850    Subjective   My elbow is doing pretty good right now    Pertinent History  New diagnosis: medial  epicondylitis, DeQuervains, Rt CTR 04/11/18, Rt olecranon bursitis. PMH: DJD both knees, Lt knee surgery 2016, low back pain, Rt wrist fx (1978)    Patient Stated Goals  decrease pain    Currently in Pain?  Yes    Pain Score  5     Pain Location  Wrist    Pain Orientation  Right    Pain Descriptors / Indicators  Aching;Sore    Pain Onset  More than a month ago    Pain Frequency  Constant    Aggravating Factors   overuse    Pain Relieving Factors  rest, massage, iontophoresis                   OT Treatments/Exercises (OP) - 08/29/18 0001      Wrist Exercises   Other wrist exercises  wrist flex/ext, and RD/UD x 10 reps each way w/ thumb relaxed (while heat on elbow)      Hand Exercises   Other Hand Exercises  thumb flex and radial abduction w/ wrist neutral (while heat on elbow)       Moist Heat Therapy   Number Minutes Moist Heat  10 Minutes    Moist Heat Location  Elbow   at beginning of session while working wrist and  thumb     Iontophoresis   Type of Iontophoresis  Dexamethasone   dose #3   Location  radial wrist/base of thumb    Dose  1.5 cc dexamethasone, 2.2 mA    Time  18 min. on with 5 minutes set up/application = 23 mintues total      Manual Therapy   Manual Therapy  Myofascial release;Soft tissue mobilization    Soft tissue mobilization  STM to Rt wrist flexors and pronators at proximal attachment to decrease fascial tightness and improve ROM.     Myofascial Release  myofascial release to wrist flexor and pronator musculature               OT Short Term Goals - 08/23/18 1313      OT SHORT TERM GOAL #1   Title  Independent with splint wear and care for DeQuervains tenosynovitis    Time  3    Period  Weeks    Status  Achieved      OT SHORT TERM GOAL #2   Title  Independent with initial HEP for DeQuervains and medial epicondylitis    Time  3    Period  Weeks    Status  Achieved      OT SHORT TERM GOAL #3   Title  Pain at Rt wrist to be  4/10 or under with functional tasks    Baseline  6/10    Time  3    Period  Weeks    Status  On-going      OT SHORT TERM GOAL #4   Title  Pt at Rt elbow to be 2/10 or under with functional tasks    Baseline  4/10    Time  3    Period  Weeks    Status  On-going        OT Long Term Goals - 08/10/18 6213      OT LONG TERM GOAL #1   Title  Independent with updated HEP for strengthening - 09/27/18    Time  6    Period  Weeks    Status  New      OT LONG TERM GOAL #2   Title  Pt to improve Rt grip strength by 10 lbs or more for gripping/lifting tasks     Baseline  64 lbs    Time  6    Period  Weeks    Status  New      OT LONG TERM GOAL #3   Title  Pt to improve Rt hand lateral and 3 tip pinch strength each by 3 lbs or greater     Baseline  Lat = 17 (Lt = 22), 3 Tip = 10 (Lt = 18)     Time  6    Period  Weeks    Status  New      OT LONG TERM GOAL #4   Title  Pt to improve coordination Rt hand as evidenced by reducing speed on 9 hole peg test to 25 sec. or less    Baseline  30.37 sec    Time  6    Period  Weeks    Status  New      OT LONG TERM GOAL #5   Title  Pt to report greater ease and less overall pain with gripping, lifting, and twisting tasks    Time  6    Period  Weeks    Status  New  Plan - 08/29/18 0921    Clinical Impression Statement  Pt with overall decreased pain especially at elbow. Pt responding well to iontophoresis at radial side of wrist, but pt also has pain at ulnar side of wrist    Occupational Profile and client history currently impacting functional performance  PMH: Rt CTS, DJD, Rt wrist fracture (Colles fx) w/ OA, Rt CTR 04/11/18    Occupational performance deficits (Please refer to evaluation for details):  ADL's;IADL's;Work;Leisure    Rehab Potential  Good    Current Impairments/barriers affecting progress:  time since onset of surgery    OT Frequency  2x / week    OT Duration  6 weeks    OT Treatment/Interventions   Self-care/ADL training;Moist Heat;Fluidtherapy;DME and/or AE instruction;Splinting;Compression bandaging;Therapeutic activities;Contrast Bath;Ultrasound;Scar mobilization;Iontophoresis;Passive range of motion;Electrical Stimulation;Paraffin;Manual Therapy;Patient/family education    Plan  continue ionto dose #4 to base of thumb, continue stretches/ex's for wrist, thumb, elbow    Consulted and Agree with Plan of Care  Patient       Patient will benefit from skilled therapeutic intervention in order to improve the following deficits and impairments:  Decreased range of motion, Increased edema, Impaired sensation, Impaired UE functional use, Pain, Decreased scar mobility, Decreased strength  Visit Diagnosis: Pain in right wrist  Pain in right elbow    Problem List Patient Active Problem List   Diagnosis Date Noted  . Well adult exam 04/03/2018  . Patella-femoral syndrome 10/11/2017  . RTI (respiratory tract infection) 07/19/2017  . Right hand weakness 03/07/2017  . Extensor intersection syndrome of right wrist 12/23/2016  . Hamstring tightness of right lower extremity 12/23/2016  . Carpal tunnel syndrome, right upper limb 06/07/2016  . Thumb pain 05/17/2016  . Paresthesia 01/20/2016  . CMC (carpometacarpal) synovitis 01/05/2016  . Medial meniscus tear 10/15/2014  . Left knee pain 09/24/2014  . Neck muscle strain 03/11/2014  . Elevated PSA 06/11/2013  . Hx of balanitis 06/11/2013  . Knee pain, bilateral 02/09/2013  . Cough 02/09/2013  . Dental infection 06/15/2012  . Dysuria 12/31/2011  . Bladder neck obstruction 10/25/2011  . Rash 07/16/2011  . Urethritis 06/01/2011  . Headache(784.0) 04/05/2011  . Abdominal pain 04/05/2011  . NECK PAIN 10/19/2010  . DYSPLASTIC NEVUS, CHEST 03/23/2010  . Eczema 03/23/2010  . HYDROCELE, RIGHT 03/03/2010  . Nocturia 03/03/2010  . Personal history of colonic polyps 03/03/2010  . RECTAL BLEEDING 01/27/2010  . Unspecified disorder of male  genital organs 01/27/2010  . NONSPECIFIC ABN FINDING RAD & OTH EXAM GU ORGAN 01/27/2010  . HEMATOCHEZIA 01/23/2010  . BENIGN PROSTATIC HYPERTROPHY 01/23/2010  . HAND PAIN 01/01/2010  . B12 deficiency 06/03/2009  . WRIST PAIN 06/03/2009  . Chest pain on respiration 04/25/2009  . SHOULDER STRAIN, RIGHT 04/25/2009  . FUNGAL DERMATITIS 04/12/2009  . LOW BACK PAIN 07/29/2008  . GERD 04/30/2008  . WEIGHT LOSS, ABNORMAL 04/30/2008  . HEMORRHOIDS 04/23/2008  . SEBACEOUS CYST, INFECTED 04/23/2008  . TENDINITIS, RIGHT ELBOW 10/21/2007    Carey Bullocks, OTR/L 08/29/2018, 9:24 AM  [facility] [address] [place], [place], [address] Phone: [phone]   Fax:  [phone]  Name: ANDY ALLENDE MRN: [id number] Date of Birth: [birth year]

## 2018-08-30 ENCOUNTER — Ambulatory Visit (INDEPENDENT_AMBULATORY_CARE_PROVIDER_SITE_OTHER): Payer: Federal, State, Local not specified - PPO | Admitting: Orthopedic Surgery

## 2018-08-30 ENCOUNTER — Encounter (INDEPENDENT_AMBULATORY_CARE_PROVIDER_SITE_OTHER): Payer: Self-pay | Admitting: Orthopedic Surgery

## 2018-08-30 DIAGNOSIS — S838X2A Sprain of other specified parts of left knee, initial encounter: Secondary | ICD-10-CM | POA: Diagnosis not present

## 2018-08-30 NOTE — Progress Notes (Signed)
Office Visit Note   Patient: Jorge Turner           Date of Birth: [birth year]           MRN: [id number] Visit Date: 08/30/2018 Requested by: [name], MD [place], [place] [address] PCP: [name], MD  Subjective: Chief Complaint  Patient presents with  . Knee Pain    HPI: Jorge Turner is a patient with bilateral knee pain.  Here to discuss treatment options for bilateral knee pain.  He has had injections done by Dr. Louanne Skye which were done about 3 months ago in which gave him some relief but not a lot of relief per patient.  He has had previous left knee arthroscopy done in 2015.  Sounds like he had about 10% of that medial meniscus removed at that time.  Did reasonably well from the surgery but he now reports pain localizing superior to the patella in both knees.  He had a lumbar MRI scan done as well at the [facility] which by my interpretation looks fairly normal.  Both MRI scans of the left knee and right knee are reviewed.  Both knees have fairly minimal degenerative changes in all 3 compartments but he does have a meniscal tear medially and posteriorly on the left knee along with a PCL cyst about 1 cm x 8 mm.  On the right knee he has fairly minimal degenerative changes and a horizontal cleavage type medial meniscal tear posteriorly.  Morphology of both meniscal tears is the same.  Does not look like there is any unstable displaceable flaps present.             ROS: All systems reviewed are negative as they relate to the chief complaint within the history of present illness.  Patient denies  fevers or chills.   Assessment & Plan: Visit Diagnoses:  1. Injury of meniscus of left knee, initial encounter     Plan: Impression is bilateral medial meniscal tears horizontal cleavage degenerative type tears in a patient he is having no real mechanical symptoms and his pain localizes above the patella.  I do not think he is necessarily a great operative candidate at this time  based on his pain.  I do want to see him back in 3 months for clinical reassessment.  I do not think the PCL cyst on the left is symptomatic at this time but may become larger in the future which would require intervention.  Follow-Up Instructions: Return in about 3 months (around 11/30/2018).   Orders:  No orders of the defined types were placed in this encounter.  No orders of the defined types were placed in this encounter.     Procedures: No procedures performed   Clinical Data: No additional findings.  Objective: Vital Signs: There were no vitals taken for this visit.  Physical Exam:   Constitutional: Patient appears well-developed HEENT:  Head: Normocephalic Eyes:EOM are normal Neck: Normal range of motion Cardiovascular: Normal rate Pulmonary/chest: Effort normal Neurologic: Patient is alert Skin: Skin is warm Psychiatric: Patient has normal mood and affect    Ortho Exam: Ortho exam demonstrates full active and passive range of motion of both knees.  No real discrete tenderness to palpation of the extensor mechanism but he does localizes pain above the patella about 2 or 3 fingerbreadths.  No effusion in either knee.  No focal joint line tenderness.  Collateral cruciate ligaments are stable in both knees.  Pedal pulses palpable  and ankle dorsiflexion plantarflexion and intact both feet.  Specialty Comments:  No specialty comments available.  Imaging: No results found.   PMFS History: Patient Active Problem List   Diagnosis Date Noted  . Well adult exam 04/03/2018  . Patella-femoral syndrome 10/11/2017  . RTI (respiratory tract infection) 07/19/2017  . Right hand weakness 03/07/2017  . Extensor intersection syndrome of right wrist 12/23/2016  . Hamstring tightness of right lower extremity 12/23/2016  . Carpal tunnel syndrome, right upper limb 06/07/2016  . Thumb pain 05/17/2016  . Paresthesia 01/20/2016  . CMC (carpometacarpal) synovitis 01/05/2016  .  Medial meniscus tear 10/15/2014  . Left knee pain 09/24/2014  . Neck muscle strain 03/11/2014  . Elevated PSA 06/11/2013  . Hx of balanitis 06/11/2013  . Knee pain, bilateral 02/09/2013  . Cough 02/09/2013  . Dental infection 06/15/2012  . Dysuria 12/31/2011  . Bladder neck obstruction 10/25/2011  . Rash 07/16/2011  . Urethritis 06/01/2011  . Headache(784.0) 04/05/2011  . Abdominal pain 04/05/2011  . NECK PAIN 10/19/2010  . DYSPLASTIC NEVUS, CHEST 03/23/2010  . Eczema 03/23/2010  . HYDROCELE, RIGHT 03/03/2010  . Nocturia 03/03/2010  . Personal history of colonic polyps 03/03/2010  . RECTAL BLEEDING 01/27/2010  . Unspecified disorder of male genital organs 01/27/2010  . NONSPECIFIC ABN FINDING RAD & OTH EXAM GU ORGAN 01/27/2010  . HEMATOCHEZIA 01/23/2010  . BENIGN PROSTATIC HYPERTROPHY 01/23/2010  . HAND PAIN 01/01/2010  . B12 deficiency 06/03/2009  . WRIST PAIN 06/03/2009  . Chest pain on respiration 04/25/2009  . SHOULDER STRAIN, RIGHT 04/25/2009  . FUNGAL DERMATITIS 04/12/2009  . LOW BACK PAIN 07/29/2008  . GERD 04/30/2008  . WEIGHT LOSS, ABNORMAL 04/30/2008  . HEMORRHOIDS 04/23/2008  . SEBACEOUS CYST, INFECTED 04/23/2008  . TENDINITIS, RIGHT ELBOW 10/21/2007   Past Medical History:  Diagnosis Date  . Balanitis   . BPH (benign prostatic hypertrophy)   . DJD (degenerative joint disease) of knee   . Eczema   . GERD (gastroesophageal reflux disease)   . Hemorrhoid   . Low back pain   . Medial meniscus tear    left  . Patellofemoral arthralgia of left knee   . Personal history of colonic polyps 03/03/2010  . Vitamin B12 deficiency     Family History  Problem Relation Age of Onset  . Hypertension Mother   . Diabetes Mother   . Hypertension Father   . Diabetes Father   . Colon cancer Neg Hx   . Rectal cancer Neg Hx   . Stomach cancer Neg Hx     Past Surgical History:  Procedure Laterality Date  . CARPAL TUNNEL RELEASE Right 04/11/2018   Procedure: RIGHT OPEN  CARPAL TUNNEL RELEASE;  Surgeon: Jessy Oto, MD;  Location: [facility];  Service: Orthopedics;  Laterality: Right;  . CIRCUMCISION N/A 07/20/2013   Procedure: CIRCUMCISION ADULT;  Surgeon: Claybon Jabs, MD;  Location: [facility];  Service: Urology;  Laterality: N/A;  . COLONOSCOPY    . CYSTO REMOVAL RIGHT THUMB  2006  . RIGHT HYDROCELECTOMY  11-13-2010   Social History   Occupational History  . Occupation: Programmer, applications: RETIRED  Tobacco Use  . Smoking status: Never Smoker  . Smokeless tobacco: Never Used  Substance and Sexual Activity  . Alcohol use: No  . Drug use: No  . Sexual activity: Yes

## 2018-08-31 ENCOUNTER — Ambulatory Visit: Payer: Federal, State, Local not specified - PPO | Admitting: Occupational Therapy

## 2018-08-31 DIAGNOSIS — M25521 Pain in right elbow: Secondary | ICD-10-CM | POA: Diagnosis not present

## 2018-08-31 DIAGNOSIS — M6281 Muscle weakness (generalized): Secondary | ICD-10-CM | POA: Diagnosis not present

## 2018-08-31 DIAGNOSIS — M25531 Pain in right wrist: Secondary | ICD-10-CM

## 2018-08-31 DIAGNOSIS — R208 Other disturbances of skin sensation: Secondary | ICD-10-CM | POA: Diagnosis not present

## 2018-08-31 NOTE — Therapy (Signed)
[facility] [address] [place], [place], [address] Phone: [phone]   Fax:  [phone]  Occupational Therapy Treatment  Patient Details  Name: Jorge Turner MRN: [id number] Date of Birth: 1956/01/01 No data recorded  Encounter Date: 08/31/2018  OT End of Session - 08/31/18 1524    Visit Number  16    Number of Visits  24    Date for OT Re-Evaluation  09/27/18    Authorization Type   BC/BS Federal (23 visits used previously this year out of 25 combined PT/OT/Speech)    Authorization - Visit Number  80    Authorization - Number of Visits  60    OT Start Time  1445    OT Stop Time  1536    OT Time Calculation (min)  51 min    Activity Tolerance  Patient tolerated treatment well    Behavior During Therapy  WFL for tasks assessed/performed       Past Medical History:  Diagnosis Date  . Balanitis   . BPH (benign prostatic hypertrophy)   . DJD (degenerative joint disease) of knee   . Eczema   . GERD (gastroesophageal reflux disease)   . Hemorrhoid   . Low back pain   . Medial meniscus tear    left  . Patellofemoral arthralgia of left knee   . Personal history of colonic polyps 03/03/2010  . Vitamin B12 deficiency     Past Surgical History:  Procedure Laterality Date  . CARPAL TUNNEL RELEASE Right 04/11/2018   Procedure: RIGHT OPEN CARPAL TUNNEL RELEASE;  Surgeon: Jessy Oto, MD;  Location: [facility];  Service: Orthopedics;  Laterality: Right;  . CIRCUMCISION N/A 07/20/2013   Procedure: CIRCUMCISION ADULT;  Surgeon: Claybon Jabs, MD;  Location: [facility];  Service: Urology;  Laterality: N/A;  . COLONOSCOPY    . CYSTO REMOVAL RIGHT THUMB  2006  . RIGHT HYDROCELECTOMY  11-13-2010    There were no vitals filed for this visit.  Subjective Assessment - 08/31/18 1449    Subjective   My elbow is doing pretty good right now    Pertinent History  New diagnosis: medial  epicondylitis, DeQuervains, Rt CTR 04/11/18, Rt olecranon bursitis. PMH: DJD both knees, Lt knee surgery 2016, low back pain, Rt wrist fx (1978)    Patient Stated Goals  decrease pain    Currently in Pain?  Yes    Pain Score  5     Pain Location  Wrist    Pain Orientation  Right    Pain Descriptors / Indicators  Aching;Sore    Pain Type  Acute pain    Pain Onset  More than a month ago    Pain Frequency  Constant    Aggravating Factors   overuse, rest, massage, iontophoresis                   OT Treatments/Exercises (OP) - 08/31/18 0001      Wrist Exercises   Other wrist exercises  wrist flex/ext, and RD/UD x 10 reps each way w/ thumb relaxed (while heat on elbow)      Hand Exercises   Other Hand Exercises  thumb flex and radial abduction w/ wrist neutral (while heat on elbow)       Moist Heat Therapy   Number Minutes Moist Heat  10 Minutes    Moist Heat Location  Elbow   at beginning of session while working wrist &  thumb     Iontophoresis   Type of Iontophoresis  Dexamethasone   dose #4   Location  radial wrist/base of thumb    Dose  1.5 cc dexamethasone, 2.2 mA    Time  18 min. on with 5 minutes set up/application = 23 mintues total      Manual Therapy   Manual Therapy  Myofascial release;Soft tissue mobilization    Soft tissue mobilization  STM to Rt wrist flexors and pronators at proximal attachment to decrease fascial tightness and improve ROM.     Myofascial Release  myofascial release to wrist flexor and pronator musculature               OT Short Term Goals - 08/23/18 1313      OT SHORT TERM GOAL #1   Title  Independent with splint wear and care for DeQuervains tenosynovitis    Time  3    Period  Weeks    Status  Achieved      OT SHORT TERM GOAL #2   Title  Independent with initial HEP for DeQuervains and medial epicondylitis    Time  3    Period  Weeks    Status  Achieved      OT SHORT TERM GOAL #3   Title  Pain at Rt wrist to be  4/10 or under with functional tasks    Baseline  6/10    Time  3    Period  Weeks    Status  On-going      OT SHORT TERM GOAL #4   Title  Pt at Rt elbow to be 2/10 or under with functional tasks    Baseline  4/10    Time  3    Period  Weeks    Status  On-going        OT Long Term Goals - 08/10/18 4268      OT LONG TERM GOAL #1   Title  Independent with updated HEP for strengthening - 09/27/18    Time  6    Period  Weeks    Status  New      OT LONG TERM GOAL #2   Title  Pt to improve Rt grip strength by 10 lbs or more for gripping/lifting tasks     Baseline  64 lbs    Time  6    Period  Weeks    Status  New      OT LONG TERM GOAL #3   Title  Pt to improve Rt hand lateral and 3 tip pinch strength each by 3 lbs or greater     Baseline  Lat = 17 (Lt = 22), 3 Tip = 10 (Lt = 18)     Time  6    Period  Weeks    Status  New      OT LONG TERM GOAL #4   Title  Pt to improve coordination Rt hand as evidenced by reducing speed on 9 hole peg test to 25 sec. or less    Baseline  30.37 sec    Time  6    Period  Weeks    Status  New      OT LONG TERM GOAL #5   Title  Pt to report greater ease and less overall pain with gripping, lifting, and twisting tasks    Time  6    Period  Weeks    Status  New  Plan - 08/31/18 1525    Clinical Impression Statement  Pt progressing with less pain overall. Pt still has pain at wrist d/t carpal shifting, and noted swelling    Occupational Profile and client history currently impacting functional performance  PMH: Rt CTS, DJD, Rt wrist fracture (Colles fx) w/ OA, Rt CTR 04/11/18    Occupational performance deficits (Please refer to evaluation for details):  ADL's;IADL's;Work;Leisure    Rehab Potential  Good    Current Impairments/barriers affecting progress:  time since onset of surgery    OT Frequency  2x / week    OT Duration  6 weeks    OT Treatment/Interventions  Self-care/ADL training;Moist Heat;Fluidtherapy;DME and/or  AE instruction;Splinting;Compression bandaging;Therapeutic activities;Contrast Bath;Ultrasound;Scar mobilization;Iontophoresis;Passive range of motion;Electrical Stimulation;Paraffin;Manual Therapy;Patient/family education    Plan  continue ionto dose #5 to base of thumb, continue stretches/ex's for wrist, thumb, elbow    Consulted and Agree with Plan of Care  Patient       Patient will benefit from skilled therapeutic intervention in order to improve the following deficits and impairments:  Decreased range of motion, Increased edema, Impaired sensation, Impaired UE functional use, Pain, Decreased scar mobility, Decreased strength  Visit Diagnosis: Pain in right wrist  Pain in right elbow  Muscle weakness (generalized)    Problem List Patient Active Problem List   Diagnosis Date Noted  . Well adult exam 04/03/2018  . Patella-femoral syndrome 10/11/2017  . RTI (respiratory tract infection) 07/19/2017  . Right hand weakness 03/07/2017  . Extensor intersection syndrome of right wrist 12/23/2016  . Hamstring tightness of right lower extremity 12/23/2016  . Carpal tunnel syndrome, right upper limb 06/07/2016  . Thumb pain 05/17/2016  . Paresthesia 01/20/2016  . CMC (carpometacarpal) synovitis 01/05/2016  . Medial meniscus tear 10/15/2014  . Left knee pain 09/24/2014  . Neck muscle strain 03/11/2014  . Elevated PSA 06/11/2013  . Hx of balanitis 06/11/2013  . Knee pain, bilateral 02/09/2013  . Cough 02/09/2013  . Dental infection 06/15/2012  . Dysuria 12/31/2011  . Bladder neck obstruction 10/25/2011  . Rash 07/16/2011  . Urethritis 06/01/2011  . Headache(784.0) 04/05/2011  . Abdominal pain 04/05/2011  . NECK PAIN 10/19/2010  . DYSPLASTIC NEVUS, CHEST 03/23/2010  . Eczema 03/23/2010  . HYDROCELE, RIGHT 03/03/2010  . Nocturia 03/03/2010  . Personal history of colonic polyps 03/03/2010  . RECTAL BLEEDING 01/27/2010  . Unspecified disorder of male genital organs 01/27/2010   . NONSPECIFIC ABN FINDING RAD & OTH EXAM GU ORGAN 01/27/2010  . HEMATOCHEZIA 01/23/2010  . BENIGN PROSTATIC HYPERTROPHY 01/23/2010  . HAND PAIN 01/01/2010  . B12 deficiency 06/03/2009  . WRIST PAIN 06/03/2009  . Chest pain on respiration 04/25/2009  . SHOULDER STRAIN, RIGHT 04/25/2009  . FUNGAL DERMATITIS 04/12/2009  . LOW BACK PAIN 07/29/2008  . GERD 04/30/2008  . WEIGHT LOSS, ABNORMAL 04/30/2008  . HEMORRHOIDS 04/23/2008  . SEBACEOUS CYST, INFECTED 04/23/2008  . TENDINITIS, RIGHT ELBOW 10/21/2007    Carey Bullocks, OTR/L 08/31/2018, 3:27 PM  [facility] [address] [place], [place], [address] Phone: [phone]   Fax:  [phone]  Name: MITCHEAL SWEETIN MRN: [id number] Date of Birth: 03/18/1956

## 2018-09-04 ENCOUNTER — Ambulatory Visit (INDEPENDENT_AMBULATORY_CARE_PROVIDER_SITE_OTHER): Payer: Federal, State, Local not specified - PPO | Admitting: Specialist

## 2018-09-04 ENCOUNTER — Telehealth (INDEPENDENT_AMBULATORY_CARE_PROVIDER_SITE_OTHER): Payer: Self-pay | Admitting: Radiology

## 2018-09-04 ENCOUNTER — Encounter (INDEPENDENT_AMBULATORY_CARE_PROVIDER_SITE_OTHER): Payer: Self-pay | Admitting: Specialist

## 2018-09-04 VITALS — BP 131/81 | HR 58 | Ht 70.0 in | Wt 165.0 lb

## 2018-09-04 DIAGNOSIS — Z9889 Other specified postprocedural states: Secondary | ICD-10-CM

## 2018-09-04 DIAGNOSIS — M5030 Other cervical disc degeneration, unspecified cervical region: Secondary | ICD-10-CM | POA: Diagnosis not present

## 2018-09-04 DIAGNOSIS — M47812 Spondylosis without myelopathy or radiculopathy, cervical region: Secondary | ICD-10-CM | POA: Diagnosis not present

## 2018-09-04 DIAGNOSIS — M19131 Post-traumatic osteoarthritis, right wrist: Secondary | ICD-10-CM | POA: Diagnosis not present

## 2018-09-04 NOTE — Progress Notes (Signed)
Office Visit Note   Patient: Jorge Turner           Date of Birth: March 16, 1956           MRN: [id number] Visit Date: 09/04/2018              Requested by: [name], MD [place], [place] [address] PCP: [name], MD   Assessment & Plan: Visit Diagnoses:  1. History of carpal tunnel surgery of right wrist   2. Post-traumatic arthritis of right wrist     Plan: Continue with OT for the right elbow and right wrist CTR and DJD. If pain is persisting than consider a MRI of the right elbow.   Follow-Up Instructions: Return in about 6 weeks (around 10/16/2018).   Orders:  No orders of the defined types were placed in this encounter.  No orders of the defined types were placed in this encounter.     Procedures: No procedures performed   Clinical Data: No additional findings.   Subjective: Chief Complaint  Patient presents with  . Lower Back - Follow-up    HPI  Review of Systems   Objective: Vital Signs: BP 131/81 (BP Location: Left Arm, Patient Position: Sitting)   Pulse (!) 58   Ht 5\' 10"  (1.778 m)   Wt 165 lb (74.8 kg)   BMI 23.68 kg/m   Physical Exam  Ortho Exam  Specialty Comments:  No specialty comments available.  Imaging: No results found.   PMFS History: Patient Active Problem List   Diagnosis Date Noted  . Well adult exam 04/03/2018  . Patella-femoral syndrome 10/11/2017  . RTI (respiratory tract infection) 07/19/2017  . Right hand weakness 03/07/2017  . Extensor intersection syndrome of right wrist 12/23/2016  . Hamstring tightness of right lower extremity 12/23/2016  . Carpal tunnel syndrome, right upper limb 06/07/2016  . Thumb pain 05/17/2016  . Paresthesia 01/20/2016  . CMC (carpometacarpal) synovitis 01/05/2016  . Medial meniscus tear 10/15/2014  . Left knee pain 09/24/2014  . Neck muscle strain 03/11/2014  . Elevated PSA 06/11/2013  . Hx of balanitis 06/11/2013  . Knee pain, bilateral 02/09/2013   . Cough 02/09/2013  . Dental infection 06/15/2012  . Dysuria 12/31/2011  . Bladder neck obstruction 10/25/2011  . Rash 07/16/2011  . Urethritis 06/01/2011  . Headache(784.0) 04/05/2011  . Abdominal pain 04/05/2011  . NECK PAIN 10/19/2010  . DYSPLASTIC NEVUS, CHEST 03/23/2010  . Eczema 03/23/2010  . HYDROCELE, RIGHT 03/03/2010  . Nocturia 03/03/2010  . Personal history of colonic polyps 03/03/2010  . RECTAL BLEEDING 01/27/2010  . Unspecified disorder of male genital organs 01/27/2010  . NONSPECIFIC ABN FINDING RAD & OTH EXAM GU ORGAN 01/27/2010  . HEMATOCHEZIA 01/23/2010  . BENIGN PROSTATIC HYPERTROPHY 01/23/2010  . HAND PAIN 01/01/2010  . B12 deficiency 06/03/2009  . WRIST PAIN 06/03/2009  . Chest pain on respiration 04/25/2009  . SHOULDER STRAIN, RIGHT 04/25/2009  . FUNGAL DERMATITIS 04/12/2009  . LOW BACK PAIN 07/29/2008  . GERD 04/30/2008  . WEIGHT LOSS, ABNORMAL 04/30/2008  . HEMORRHOIDS 04/23/2008  . SEBACEOUS CYST, INFECTED 04/23/2008  . TENDINITIS, RIGHT ELBOW 10/21/2007   Past Medical History:  Diagnosis Date  . Balanitis   . BPH (benign prostatic hypertrophy)   . DJD (degenerative joint disease) of knee   . Eczema   . GERD (gastroesophageal reflux disease)   . Hemorrhoid   . Low back pain   . Medial meniscus tear  left  . Patellofemoral arthralgia of left knee   . Personal history of colonic polyps 03/03/2010  . Vitamin B12 deficiency     Family History  Problem Relation Age of Onset  . Hypertension Mother   . Diabetes Mother   . Hypertension Father   . Diabetes Father   . Colon cancer Neg Hx   . Rectal cancer Neg Hx   . Stomach cancer Neg Hx     Past Surgical History:  Procedure Laterality Date  . CARPAL TUNNEL RELEASE Right 04/11/2018   Procedure: RIGHT OPEN CARPAL TUNNEL RELEASE;  Surgeon: Jessy Oto, MD;  Location: [facility];  Service: Orthopedics;  Laterality: Right;  . CIRCUMCISION N/A 07/20/2013   Procedure:  CIRCUMCISION ADULT;  Surgeon: Claybon Jabs, MD;  Location: [facility];  Service: Urology;  Laterality: N/A;  . COLONOSCOPY    . CYSTO REMOVAL RIGHT THUMB  2006  . RIGHT HYDROCELECTOMY  11-13-2010   Social History   Occupational History  . Occupation: Programmer, applications: RETIRED  Tobacco Use  . Smoking status: Never Smoker  . Smokeless tobacco: Never Used  Substance and Sexual Activity  . Alcohol use: No  . Drug use: No  . Sexual activity: Yes

## 2018-09-04 NOTE — Patient Instructions (Signed)
Continue with OT for the right elbow and right wrist CTR and DJD. If pain is persisting than consider a MRI of the right elbow.

## 2018-09-04 NOTE — Telephone Encounter (Signed)
Can you please schedule six week follow up appointment for patient on Monday, October 16, 2018 at 3:45 with Dr. Louanne Skye? It will not let me make the appointment.  Patient is already aware of date and time. It is return for wrist, back, etc.

## 2018-09-05 ENCOUNTER — Ambulatory Visit: Payer: Federal, State, Local not specified - PPO | Admitting: Occupational Therapy

## 2018-09-05 DIAGNOSIS — R208 Other disturbances of skin sensation: Secondary | ICD-10-CM

## 2018-09-05 DIAGNOSIS — M6281 Muscle weakness (generalized): Secondary | ICD-10-CM | POA: Diagnosis not present

## 2018-09-05 DIAGNOSIS — M25531 Pain in right wrist: Secondary | ICD-10-CM

## 2018-09-05 DIAGNOSIS — M25521 Pain in right elbow: Secondary | ICD-10-CM

## 2018-09-05 NOTE — Therapy (Signed)
[facility] [address] [place], [place], [address] Phone: [phone]   Fax:  [phone]  Occupational Therapy Treatment  Patient Details  Name: Jorge Turner MRN: [id number] Date of Birth: [birth year] No data recorded  Encounter Date: 09/05/2018  OT End of Session - 09/05/18 0927    Visit Number  17    Number of Visits  24    Date for OT Re-Evaluation  09/27/18    Authorization Type   BC/BS Federal (23 visits used previously this year out of 38 combined PT/OT/Speech)    Authorization - Visit Number  92    Authorization - Number of Visits  44    OT Start Time  0847    OT Stop Time  0942    OT Time Calculation (min)  55 min    Activity Tolerance  Patient tolerated treatment well    Behavior During Therapy  [facility] for tasks assessed/performed       Past Medical History:  Diagnosis Date  . Balanitis   . BPH (benign prostatic hypertrophy)   . DJD (degenerative joint disease) of knee   . Eczema   . GERD (gastroesophageal reflux disease)   . Hemorrhoid   . Low back pain   . Medial meniscus tear    left  . Patellofemoral arthralgia of left knee   . Personal history of colonic polyps 03/03/2010  . Vitamin B12 deficiency     Past Surgical History:  Procedure Laterality Date  . CARPAL TUNNEL RELEASE Right 04/11/2018   Procedure: RIGHT OPEN CARPAL TUNNEL RELEASE;  Surgeon: Jessy Oto, MD;  Location: [facility];  Service: Orthopedics;  Laterality: Right;  . CIRCUMCISION N/A 07/20/2013   Procedure: CIRCUMCISION ADULT;  Surgeon: Claybon Jabs, MD;  [facility];  Service: Urology;  Laterality: N/A;  . COLONOSCOPY    . CYSTO REMOVAL RIGHT THUMB  2006  . RIGHT HYDROCELECTOMY  11-13-2010    There were no vitals filed for this visit.  Subjective Assessment - 09/05/18 0853    Subjective   It's a little sore today. I saw Dr. Louanne Skye yesterday. He said he will do an MRI if it doesn't  get better    Pertinent History  New diagnosis: medial epicondylitis, DeQuervains, Rt CTR 04/11/18, Rt olecranon bursitis. PMH: DJD both knees, Lt knee surgery 2016, low back pain, Rt wrist fx (1978)    Patient Stated Goals  decrease pain    Currently in Pain?  Yes    Pain Score  6    3/10 at elbow   Pain Location  Wrist    Pain Orientation  Right    Pain Descriptors / Indicators  Aching;Sore    Pain Type  Chronic pain    Pain Onset  More than a month ago    Pain Frequency  Constant    Aggravating Factors   overuse    Pain Relieving Factors  rest, massage, iontophoresis                   OT Treatments/Exercises (OP) - 09/05/18 0001      Wrist Exercises   Other wrist exercises  wrist flex/ext, and RD/UD x 10 reps each way w/ thumb relaxed (while heat on elbow)      Hand Exercises   Other Hand Exercises  thumb flex and radial abduction w/ wrist neutral (while heat on elbow)       Moist Heat Therapy  Number Minutes Moist Heat  10 Minutes    Moist Heat Location  Elbow   at beginning of session while working wrist/thumb     Iontophoresis   Type of Iontophoresis  Dexamethasone   dose #5   Location  radial wrist/base of thumb    Dose  1.5 cc dexamethasone, 2.2 mA    Time  18 min. on with 5 minutes set up/application = 23 mintues total      Manual Therapy   Soft tissue mobilization  STM to Rt wrist flexors and pronators at proximal attachment to decrease fascial tightness and improve ROM. Followed by stretches in supination and wrist extension    Myofascial Release  myofascial release to wrist flexor and pronator musculature               OT Short Term Goals - 08/23/18 1313      OT SHORT TERM GOAL #1   Title  Independent with splint wear and care for DeQuervains tenosynovitis    Time  3    Period  Weeks    Status  Achieved      OT SHORT TERM GOAL #2   Title  Independent with initial HEP for DeQuervains and medial epicondylitis    Time  3    Period   Weeks    Status  Achieved      OT SHORT TERM GOAL #3   Title  Pain at Rt wrist to be 4/10 or under with functional tasks    Baseline  6/10    Time  3    Period  Weeks    Status  On-going      OT SHORT TERM GOAL #4   Title  Pt at Rt elbow to be 2/10 or under with functional tasks    Baseline  4/10    Time  3    Period  Weeks    Status  On-going        OT Long Term Goals - 08/10/18 3893      OT LONG TERM GOAL #1   Title  Independent with updated HEP for strengthening - 09/27/18    Time  6    Period  Weeks    Status  New      OT LONG TERM GOAL #2   Title  Pt to improve Rt grip strength by 10 lbs or more for gripping/lifting tasks     Baseline  64 lbs    Time  6    Period  Weeks    Status  New      OT LONG TERM GOAL #3   Title  Pt to improve Rt hand lateral and 3 tip pinch strength each by 3 lbs or greater     Baseline  Lat = 17 (Lt = 22), 3 Tip = 10 (Lt = 18)     Time  6    Period  Weeks    Status  New      OT LONG TERM GOAL #4   Title  Pt to improve coordination Rt hand as evidenced by reducing speed on 9 hole peg test to 25 sec. or less    Baseline  30.37 sec    Time  6    Period  Weeks    Status  New      OT LONG TERM GOAL #5   Title  Pt to report greater ease and less overall pain with gripping, lifting, and twisting tasks  Time  6    Period  Weeks    Status  New            Plan - 09/05/18 6295    Clinical Impression Statement  Pt with slightly increased pain today since seeing MD yesterday. Noted pt tight Rt upper traps and constantly activating upper traps    Occupational Profile and client history currently impacting functional performance  PMH: Rt CTS, DJD, Rt wrist fracture (Colles fx) w/ OA, Rt CTR 04/11/18    Occupational performance deficits (Please refer to evaluation for details):  ADL's;IADL's;Work;Leisure    Rehab Potential  Good    Current Impairments/barriers affecting progress:  time since onset of surgery    OT Frequency  2x / week     OT Duration  6 weeks    OT Treatment/Interventions  Self-care/ADL training;Moist Heat;Fluidtherapy;DME and/or AE instruction;Splinting;Compression bandaging;Therapeutic activities;Contrast Bath;Ultrasound;Scar mobilization;Iontophoresis;Passive range of motion;Electrical Stimulation;Paraffin;Manual Therapy;Patient/family education    Plan  continue ionto dose #6 to base of thumb, continue stretches/ex's for wrist, thumb, elbow    Consulted and Agree with Plan of Care  Patient       Patient will benefit from skilled therapeutic intervention in order to improve the following deficits and impairments:  Decreased range of motion, Increased edema, Impaired sensation, Impaired UE functional use, Pain, Decreased scar mobility, Decreased strength  Visit Diagnosis: Pain in right wrist  Pain in right elbow  Muscle weakness (generalized)  Other disturbances of skin sensation    Problem List Patient Active Problem List   Diagnosis Date Noted  . Well adult exam 04/03/2018  . Patella-femoral syndrome 10/11/2017  . RTI (respiratory tract infection) 07/19/2017  . Right hand weakness 03/07/2017  . Extensor intersection syndrome of right wrist 12/23/2016  . Hamstring tightness of right lower extremity 12/23/2016  . Carpal tunnel syndrome, right upper limb 06/07/2016  . Thumb pain 05/17/2016  . Paresthesia 01/20/2016  . CMC (carpometacarpal) synovitis 01/05/2016  . Medial meniscus tear 10/15/2014  . Left knee pain 09/24/2014  . Neck muscle strain 03/11/2014  . Elevated PSA 06/11/2013  . Hx of balanitis 06/11/2013  . Knee pain, bilateral 02/09/2013  . Cough 02/09/2013  . Dental infection 06/15/2012  . Dysuria 12/31/2011  . Bladder neck obstruction 10/25/2011  . Rash 07/16/2011  . Urethritis 06/01/2011  . Headache(784.0) 04/05/2011  . Abdominal pain 04/05/2011  . NECK PAIN 10/19/2010  . DYSPLASTIC NEVUS, CHEST 03/23/2010  . Eczema 03/23/2010  . HYDROCELE, RIGHT 03/03/2010  .  Nocturia 03/03/2010  . Personal history of colonic polyps 03/03/2010  . RECTAL BLEEDING 01/27/2010  . Unspecified disorder of male genital organs 01/27/2010  . NONSPECIFIC ABN FINDING RAD & OTH EXAM GU ORGAN 01/27/2010  . HEMATOCHEZIA 01/23/2010  . BENIGN PROSTATIC HYPERTROPHY 01/23/2010  . HAND PAIN 01/01/2010  . B12 deficiency 06/03/2009  . WRIST PAIN 06/03/2009  . Chest pain on respiration 04/25/2009  . SHOULDER STRAIN, RIGHT 04/25/2009  . FUNGAL DERMATITIS 04/12/2009  . LOW BACK PAIN 07/29/2008  . GERD 04/30/2008  . WEIGHT LOSS, ABNORMAL 04/30/2008  . HEMORRHOIDS 04/23/2008  . SEBACEOUS CYST, INFECTED 04/23/2008  . TENDINITIS, RIGHT ELBOW 10/21/2007    Carey Bullocks, OTR/L 09/05/2018, 9:31 AM  [facility] [address] [place], [place], [address] Phone: [phone]   Fax:  [phone]  Name: KENECHUKWU ECKSTEIN MRN: [id number] Date of Birth: [birth year]

## 2018-09-08 ENCOUNTER — Ambulatory Visit: Payer: Federal, State, Local not specified - PPO | Admitting: Occupational Therapy

## 2018-09-12 ENCOUNTER — Ambulatory Visit: Payer: Federal, State, Local not specified - PPO | Admitting: Occupational Therapy

## 2018-09-12 DIAGNOSIS — M25531 Pain in right wrist: Secondary | ICD-10-CM | POA: Diagnosis not present

## 2018-09-12 DIAGNOSIS — M6281 Muscle weakness (generalized): Secondary | ICD-10-CM

## 2018-09-12 DIAGNOSIS — M25521 Pain in right elbow: Secondary | ICD-10-CM

## 2018-09-12 DIAGNOSIS — R208 Other disturbances of skin sensation: Secondary | ICD-10-CM | POA: Diagnosis not present

## 2018-09-12 NOTE — Therapy (Signed)
[facility] [address] [place], [place], [address] Phone: [phone]   Fax:  [phone]  Occupational Therapy Treatment  Patient Details  Name: Jorge Turner MRN: [id number] Date of Birth: 1956-02-16 No data recorded  Encounter Date: 09/12/2018  OT End of Session - 09/12/18 6979    Visit Number  18    Number of Visits  24    Date for OT Re-Evaluation  09/27/18    Authorization Type   BC/BS Federal (23 visits used previously this year out of 81 combined PT/OT/Speech)    Authorization - Visit Number  39    Authorization - Number of Visits  61    OT Start Time  0845    OT Stop Time  0935    OT Time Calculation (min)  50 min    Activity Tolerance  Patient tolerated treatment well    Behavior During Therapy  [facility] for tasks assessed/performed       Past Medical History:  Diagnosis Date  . Balanitis   . BPH (benign prostatic hypertrophy)   . DJD (degenerative joint disease) of knee   . Eczema   . GERD (gastroesophageal reflux disease)   . Hemorrhoid   . Low back pain   . Medial meniscus tear    left  . Patellofemoral arthralgia of left knee   . Personal history of colonic polyps 03/03/2010  . Vitamin B12 deficiency     Past Surgical History:  Procedure Laterality Date  . CARPAL TUNNEL RELEASE Right 04/11/2018   Procedure: RIGHT OPEN CARPAL TUNNEL RELEASE;  Surgeon: Jessy Oto, MD;  Location: [facility];  Service: Orthopedics;  Laterality: Right;  . CIRCUMCISION N/A 07/20/2013   Procedure: CIRCUMCISION ADULT;  Surgeon: Claybon Jabs, MD;  Location: [facility]  Service: Urology;  Laterality: N/A;  . COLONOSCOPY    . CYSTO REMOVAL RIGHT THUMB  2006  . RIGHT HYDROCELECTOMY  11-13-2010    There were no vitals filed for this visit.  Subjective Assessment - 09/12/18 0849    Pertinent History  New diagnosis: medial epicondylitis, DeQuervains, Rt CTR 04/11/18, Rt olecranon  bursitis. PMH: DJD both knees, Lt knee surgery 2016, low back pain, Rt wrist fx (1978)    Patient Stated Goals  decrease pain    Currently in Pain?  Yes    Pain Score  5    3-4/10 at Rt elbow   Pain Location  Wrist    Pain Orientation  Right    Pain Descriptors / Indicators  Aching;Sore    Pain Type  Chronic pain    Pain Onset  More than a month ago    Pain Frequency  Constant    Aggravating Factors   overuse    Pain Relieving Factors  rest, massage, iontophoresis                   OT Treatments/Exercises (OP) - 09/12/18 0001      Wrist Exercises   Other wrist exercises  wrist flex/ext, and RD/UD x 10 reps each way w/ thumb relaxed (while heat on elbow)      Hand Exercises   Other Hand Exercises  thumb flex and radial abduction w/ wrist neutral (while heat on elbow)       Moist Heat Therapy   Number Minutes Moist Heat  10 Minutes    Moist Heat Location  Elbow   at beginning of session while simultaneously working wrist/thumb  Iontophoresis   Type of Iontophoresis  Dexamethasone   dose #6   Location  radial wrist/base of thumb    Dose  1.5 cc dexamethasone, 2.2 mA    Time  18 min. on with 5 minutes set up/application = 23 mintues total      Manual Therapy   Soft tissue mobilization  STM to Rt wrist flexors and pronators at proximal attachment to decrease fascial tightness and improve ROM. Followed by stretches in supination and wrist extension    Myofascial Release  myofascial release to wrist flexor and pronator musculature               OT Short Term Goals - 08/23/18 1313      OT SHORT TERM GOAL #1   Title  Independent with splint wear and care for DeQuervains tenosynovitis    Time  3    Period  Weeks    Status  Achieved      OT SHORT TERM GOAL #2   Title  Independent with initial HEP for DeQuervains and medial epicondylitis    Time  3    Period  Weeks    Status  Achieved      OT SHORT TERM GOAL #3   Title  Pain at Rt wrist to be 4/10  or under with functional tasks    Baseline  6/10    Time  3    Period  Weeks    Status  On-going      OT SHORT TERM GOAL #4   Title  Pt at Rt elbow to be 2/10 or under with functional tasks    Baseline  4/10    Time  3    Period  Weeks    Status  On-going        OT Long Term Goals - 08/10/18 3295      OT LONG TERM GOAL #1   Title  Independent with updated HEP for strengthening - 09/27/18    Time  6    Period  Weeks    Status  New      OT LONG TERM GOAL #2   Title  Pt to improve Rt grip strength by 10 lbs or more for gripping/lifting tasks     Baseline  64 lbs    Time  6    Period  Weeks    Status  New      OT LONG TERM GOAL #3   Title  Pt to improve Rt hand lateral and 3 tip pinch strength each by 3 lbs or greater     Baseline  Lat = 17 (Lt = 22), 3 Tip = 10 (Lt = 18)     Time  6    Period  Weeks    Status  New      OT LONG TERM GOAL #4   Title  Pt to improve coordination Rt hand as evidenced by reducing speed on 9 hole peg test to 25 sec. or less    Baseline  30.37 sec    Time  6    Period  Weeks    Status  New      OT LONG TERM GOAL #5   Title  Pt to report greater ease and less overall pain with gripping, lifting, and twisting tasks    Time  6    Period  Weeks    Status  New            Plan -  09/12/18 0923    Clinical Impression Statement  Pt with decreased pain today overall.     Occupational Profile and client history currently impacting functional performance  PMH: Rt CTS, DJD, Rt wrist fracture (Colles fx) w/ OA, Rt CTR 04/11/18    Occupational performance deficits (Please refer to evaluation for details):  ADL's;IADL's;Work;Leisure    Rehab Potential  Good    Current Impairments/barriers affecting progress:  time since onset of surgery    OT Frequency  2x / week    OT Duration  6 weeks    OT Treatment/Interventions  Self-care/ADL training;Moist Heat;Fluidtherapy;DME and/or AE instruction;Splinting;Compression bandaging;Therapeutic  activities;Contrast Bath;Ultrasound;Scar mobilization;Iontophoresis;Passive range of motion;Electrical Stimulation;Paraffin;Manual Therapy;Patient/family education    Plan  begin isometric wrist strengthening, progress protocol for medial epicondylitis    Consulted and Agree with Plan of Care  Patient       Patient will benefit from skilled therapeutic intervention in order to improve the following deficits and impairments:  Decreased range of motion, Increased edema, Impaired sensation, Impaired UE functional use, Pain, Decreased scar mobility, Decreased strength  Visit Diagnosis: Pain in right wrist  Pain in right elbow  Muscle weakness (generalized)    Problem List Patient Active Problem List   Diagnosis Date Noted  . Well adult exam 04/03/2018  . Patella-femoral syndrome 10/11/2017  . RTI (respiratory tract infection) 07/19/2017  . Right hand weakness 03/07/2017  . Extensor intersection syndrome of right wrist 12/23/2016  . Hamstring tightness of right lower extremity 12/23/2016  . Carpal tunnel syndrome, right upper limb 06/07/2016  . Thumb pain 05/17/2016  . Paresthesia 01/20/2016  . CMC (carpometacarpal) synovitis 01/05/2016  . Medial meniscus tear 10/15/2014  . Left knee pain 09/24/2014  . Neck muscle strain 03/11/2014  . Elevated PSA 06/11/2013  . Hx of balanitis 06/11/2013  . Knee pain, bilateral 02/09/2013  . Cough 02/09/2013  . Dental infection 06/15/2012  . Dysuria 12/31/2011  . Bladder neck obstruction 10/25/2011  . Rash 07/16/2011  . Urethritis 06/01/2011  . Headache(784.0) 04/05/2011  . Abdominal pain 04/05/2011  . NECK PAIN 10/19/2010  . DYSPLASTIC NEVUS, CHEST 03/23/2010  . Eczema 03/23/2010  . HYDROCELE, RIGHT 03/03/2010  . Nocturia 03/03/2010  . Personal history of colonic polyps 03/03/2010  . RECTAL BLEEDING 01/27/2010  . Unspecified disorder of male genital organs 01/27/2010  . NONSPECIFIC ABN FINDING RAD & OTH EXAM GU ORGAN 01/27/2010  .  HEMATOCHEZIA 01/23/2010  . BENIGN PROSTATIC HYPERTROPHY 01/23/2010  . HAND PAIN 01/01/2010  . B12 deficiency 06/03/2009  . WRIST PAIN 06/03/2009  . Chest pain on respiration 04/25/2009  . SHOULDER STRAIN, RIGHT 04/25/2009  . FUNGAL DERMATITIS 04/12/2009  . LOW BACK PAIN 07/29/2008  . GERD 04/30/2008  . WEIGHT LOSS, ABNORMAL 04/30/2008  . HEMORRHOIDS 04/23/2008  . SEBACEOUS CYST, INFECTED 04/23/2008  . TENDINITIS, RIGHT ELBOW 10/21/2007    Carey Bullocks, OTR/L 09/12/2018, 9:27 AM  [facility] [address] [place], [place], [address] Phone: [phone]   Fax:  [phone]  Name: SHAHEEM PICHON MRN: [id number] Date of Birth: [birth year]

## 2018-09-17 ENCOUNTER — Other Ambulatory Visit (INDEPENDENT_AMBULATORY_CARE_PROVIDER_SITE_OTHER): Payer: Self-pay | Admitting: Specialist

## 2018-09-18 ENCOUNTER — Ambulatory Visit: Payer: Federal, State, Local not specified - PPO | Admitting: Occupational Therapy

## 2018-09-18 DIAGNOSIS — M25531 Pain in right wrist: Secondary | ICD-10-CM

## 2018-09-18 DIAGNOSIS — M25521 Pain in right elbow: Secondary | ICD-10-CM | POA: Diagnosis not present

## 2018-09-18 DIAGNOSIS — R208 Other disturbances of skin sensation: Secondary | ICD-10-CM | POA: Diagnosis not present

## 2018-09-18 DIAGNOSIS — M6281 Muscle weakness (generalized): Secondary | ICD-10-CM | POA: Diagnosis not present

## 2018-09-18 NOTE — Patient Instructions (Signed)
Flexion (Resistive)    Hold a can weighing _2 lb. weight in hand and bend elbow, keeping wrist straight. Support elbow with folded towel on table, or hold close to body.  Repeat _10___ times. Do __2__ sessions per day.   Wrist Extension: Isometric    With left forearm resting palm down on thigh, resist upward movement of hand with other hand. Hold __10__ seconds. Relax. Repeat _5___ times per set.  Do __2__ sessions per day.  Wrist Radial Deviation: Isometric    With left forearm resting on thigh, thumb up, use other hand to resist upward movement of hand at wrist. Hold _10___ seconds. Relax. Repeat __5__ times per set.  Do __2__ sessions per day.  Flexion (Isometric)    With forearm held steady, palm up, use other hand to resist upward movement of hand at wrist. Hold _10___ seconds. Relax. Repeat _5___ times. Do __2__ sessions per day. STOP if painful at elbow   5. Hold 3 lb. weight keeping wrist and forearm neutral and walk 150 feet, 2x/day

## 2018-09-18 NOTE — Therapy (Signed)
[facility] [address] [place], [place], [address] Phone: [phone]   Fax:  [phone]  Occupational Therapy Treatment  Patient Details  Name: Jorge Turner MRN: [id number] Date of Birth: [birth year] No data recorded  Encounter Date: 09/18/2018  OT End of Session - 09/18/18 1315    Visit Number  19    Number of Visits  24    Date for OT Re-Evaluation  09/27/18    Authorization Type   BC/BS Federal (23 visits used previously this year out of 25 combined PT/OT/Speech)    Authorization - Visit Number  22    Authorization - Number of Visits  77    OT Start Time  1235    OT Stop Time  1315    OT Time Calculation (min)  40 min    Activity Tolerance  Patient tolerated treatment well    Behavior During Therapy  [facility] for tasks assessed/performed       Past Medical History:  Diagnosis Date  . Balanitis   . BPH (benign prostatic hypertrophy)   . DJD (degenerative joint disease) of knee   . Eczema   . GERD (gastroesophageal reflux disease)   . Hemorrhoid   . Low back pain   . Medial meniscus tear    left  . Patellofemoral arthralgia of left knee   . Personal history of colonic polyps 03/03/2010  . Vitamin B12 deficiency     Past Surgical History:  Procedure Laterality Date  . CARPAL TUNNEL RELEASE Right 04/11/2018   Procedure: RIGHT OPEN CARPAL TUNNEL RELEASE;  Surgeon: Jessy Oto, MD;  Location: [facility];  Service: Orthopedics;  Laterality: Right;  . CIRCUMCISION N/A 07/20/2013   Procedure: CIRCUMCISION ADULT;  Surgeon: Claybon Jabs, MD;  [facility];  Service: Urology;  Laterality: N/A;  . COLONOSCOPY    . CYSTO REMOVAL RIGHT THUMB  2006  . RIGHT HYDROCELECTOMY  11-13-2010    There were no vitals filed for this visit.  Subjective Assessment - 09/18/18 1244    Subjective   My elbow feels better than my wrist and forearm    Pertinent History  New diagnosis: medial  epicondylitis, DeQuervains, Rt CTR 04/11/18, Rt olecranon bursitis. PMH: DJD both knees, Lt knee surgery 2016, low back pain, Rt wrist fx (1978)    Patient Stated Goals  decrease pain    Currently in Pain?  Yes    Pain Score  5     Pain Location  Wrist   and forearm   Pain Orientation  Right    Pain Descriptors / Indicators  Sore    Pain Type  Chronic pain    Pain Onset  More than a month ago    Pain Frequency  Constant    Aggravating Factors   use    Pain Relieving Factors  rest, massage, heat                   OT Treatments/Exercises (OP) - 09/18/18 0001      Exercises   Exercises  Elbow      Elbow Exercises   Elbow Flexion  Strengthening;10 reps   2 lbs.   Other elbow exercises  UBE x 5 min. level 5 for overal UB strengthening/conditioning      Wrist Exercises   Other wrist exercises  Isometric wrist strengthening in wrist ext, RD, wrist flex x 5 reps holding 10 sec. Followed by walking and holding  3 lb weight in hand keeping wrist and forearm neutral for isometric strengthening (issued all as HEP)       Modalities   Modalities  Fluidotherapy      RUE Fluidotherapy   Number Minutes Fluidotherapy  12 Minutes    RUE Fluidotherapy Location  Hand;Wrist    Comments  at beginning of session       Pt instructed to stop ex's if pain increases. Pt arrived w/ 5/10 pain. Pt instructed to perform isometric wrist flexion only as tolerated      OT Education - 09/18/18 1308    Education Details  strengthening HEP     Person(s) Educated  Patient    Methods  Explanation;Demonstration;Handout    Comprehension  Verbalized understanding;Returned demonstration       OT Short Term Goals - 08/23/18 1313      OT SHORT TERM GOAL #1   Title  Independent with splint wear and care for DeQuervains tenosynovitis    Time  3    Period  Weeks    Status  Achieved      OT SHORT TERM GOAL #2   Title  Independent with initial HEP for DeQuervains and medial epicondylitis     Time  3    Period  Weeks    Status  Achieved      OT SHORT TERM GOAL #3   Title  Pain at Rt wrist to be 4/10 or under with functional tasks    Baseline  6/10    Time  3    Period  Weeks    Status  On-going      OT SHORT TERM GOAL #4   Title  Pt at Rt elbow to be 2/10 or under with functional tasks    Baseline  4/10    Time  3    Period  Weeks    Status  On-going        OT Long Term Goals - 08/10/18 2426      OT LONG TERM GOAL #1   Title  Independent with updated HEP for strengthening - 09/27/18    Time  6    Period  Weeks    Status  New      OT LONG TERM GOAL #2   Title  Pt to improve Rt grip strength by 10 lbs or more for gripping/lifting tasks     Baseline  64 lbs    Time  6    Period  Weeks    Status  New      OT LONG TERM GOAL #3   Title  Pt to improve Rt hand lateral and 3 tip pinch strength each by 3 lbs or greater     Baseline  Lat = 17 (Lt = 22), 3 Tip = 10 (Lt = 18)     Time  6    Period  Weeks    Status  New      OT LONG TERM GOAL #4   Title  Pt to improve coordination Rt hand as evidenced by reducing speed on 9 hole peg test to 25 sec. or less    Baseline  30.37 sec    Time  6    Period  Weeks    Status  New      OT LONG TERM GOAL #5   Title  Pt to report greater ease and less overall pain with gripping, lifting, and twisting tasks    Time  6  Period  Weeks    Status  New            Plan - 09/18/18 1317    Clinical Impression Statement  Pt tolerating initial strengthening HEP well today w/ no increase in pain. Pt instructed to stop if pain increases    Occupational Profile and client history currently impacting functional performance  PMH: Rt CTS, DJD, Rt wrist fracture (Colles fx) w/ OA, Rt CTR 04/11/18    Occupational performance deficits (Please refer to evaluation for details):  ADL's;IADL's;Work;Leisure    Rehab Potential  Good    Current Impairments/barriers affecting progress:  time since onset of surgery    OT Frequency  2x /  week    OT Duration  6 weeks    OT Treatment/Interventions  Self-care/ADL training;Moist Heat;Fluidtherapy;DME and/or AE instruction;Splinting;Compression bandaging;Therapeutic activities;Contrast Bath;Ultrasound;Scar mobilization;Iontophoresis;Passive range of motion;Electrical Stimulation;Paraffin;Manual Therapy;Patient/family education    Plan  continue fluidotherapy, review HEP and assess pain with it, work on coordination and pinch strength Rt hand    Consulted and Agree with Plan of Care  Patient       Patient will benefit from skilled therapeutic intervention in order to improve the following deficits and impairments:  Decreased range of motion, Increased edema, Impaired sensation, Impaired UE functional use, Pain, Decreased scar mobility, Decreased strength  Visit Diagnosis: Pain in right wrist  Pain in right elbow  Muscle weakness (generalized)    Problem List Patient Active Problem List   Diagnosis Date Noted  . Well adult exam 04/03/2018  . Patella-femoral syndrome 10/11/2017  . RTI (respiratory tract infection) 07/19/2017  . Right hand weakness 03/07/2017  . Extensor intersection syndrome of right wrist 12/23/2016  . Hamstring tightness of right lower extremity 12/23/2016  . Carpal tunnel syndrome, right upper limb 06/07/2016  . Thumb pain 05/17/2016  . Paresthesia 01/20/2016  . CMC (carpometacarpal) synovitis 01/05/2016  . Medial meniscus tear 10/15/2014  . Left knee pain 09/24/2014  . Neck muscle strain 03/11/2014  . Elevated PSA 06/11/2013  . Hx of balanitis 06/11/2013  . Knee pain, bilateral 02/09/2013  . Cough 02/09/2013  . Dental infection 06/15/2012  . Dysuria 12/31/2011  . Bladder neck obstruction 10/25/2011  . Rash 07/16/2011  . Urethritis 06/01/2011  . Headache(784.0) 04/05/2011  . Abdominal pain 04/05/2011  . NECK PAIN 10/19/2010  . DYSPLASTIC NEVUS, CHEST 03/23/2010  . Eczema 03/23/2010  . HYDROCELE, RIGHT 03/03/2010  . Nocturia 03/03/2010  .  Personal history of colonic polyps 03/03/2010  . RECTAL BLEEDING 01/27/2010  . Unspecified disorder of male genital organs 01/27/2010  . NONSPECIFIC ABN FINDING RAD & OTH EXAM GU ORGAN 01/27/2010  . HEMATOCHEZIA 01/23/2010  . BENIGN PROSTATIC HYPERTROPHY 01/23/2010  . HAND PAIN 01/01/2010  . B12 deficiency 06/03/2009  . WRIST PAIN 06/03/2009  . Chest pain on respiration 04/25/2009  . SHOULDER STRAIN, RIGHT 04/25/2009  . FUNGAL DERMATITIS 04/12/2009  . LOW BACK PAIN 07/29/2008  . GERD 04/30/2008  . WEIGHT LOSS, ABNORMAL 04/30/2008  . HEMORRHOIDS 04/23/2008  . SEBACEOUS CYST, INFECTED 04/23/2008  . TENDINITIS, RIGHT ELBOW 10/21/2007    Carey Bullocks, OTR/L 09/18/2018, 1:18 PM  [facility] [address] [place], [place], [address] Phone: [phone]   Fax:  [phone]  Name: Jorge Turner MRN: [id number] Date of Birth: [birth year]

## 2018-09-18 NOTE — Telephone Encounter (Signed)
Naproxen refill request 

## 2018-09-21 ENCOUNTER — Ambulatory Visit: Payer: Federal, State, Local not specified - PPO | Admitting: Occupational Therapy

## 2018-10-02 ENCOUNTER — Ambulatory Visit: Payer: Federal, State, Local not specified - PPO | Admitting: Internal Medicine

## 2018-10-03 ENCOUNTER — Ambulatory Visit: Payer: Federal, State, Local not specified - PPO | Admitting: Internal Medicine

## 2018-10-03 ENCOUNTER — Other Ambulatory Visit (INDEPENDENT_AMBULATORY_CARE_PROVIDER_SITE_OTHER): Payer: Federal, State, Local not specified - PPO

## 2018-10-03 ENCOUNTER — Encounter: Payer: Self-pay | Admitting: Internal Medicine

## 2018-10-03 DIAGNOSIS — K5790 Diverticulosis of intestine, part unspecified, without perforation or abscess without bleeding: Secondary | ICD-10-CM | POA: Insufficient documentation

## 2018-10-03 DIAGNOSIS — R1084 Generalized abdominal pain: Secondary | ICD-10-CM

## 2018-10-03 LAB — BASIC METABOLIC PANEL
BUN: 27 mg/dL — ABNORMAL HIGH (ref 6–23)
CHLORIDE: 104 meq/L (ref 96–112)
CO2: 29 meq/L (ref 19–32)
Calcium: 9.8 mg/dL (ref 8.4–10.5)
Creatinine, Ser: 1.44 mg/dL (ref 0.40–1.50)
GFR: 64 mL/min (ref >60.00)
Glucose, Bld: 110 mg/dL — ABNORMAL HIGH (ref 70–99)
Potassium: 4.5 mEq/L (ref 3.5–5.1)
SODIUM: 139 meq/L (ref 135–145)

## 2018-10-03 LAB — CBC WITH DIFFERENTIAL/PLATELET
BASOS ABS: 0 10*3/uL (ref 0.0–0.1)
BASOS PCT: 1.1 % (ref 0.0–3.0)
Eosinophils Absolute: 0.1 10*3/uL (ref 0.0–0.7)
Eosinophils Relative: 2.6 % (ref 0.0–5.0)
HEMATOCRIT: 43.8 % (ref 39.0–52.0)
HEMOGLOBIN: 14.9 g/dL (ref 13.0–17.0)
LYMPHS PCT: 27.7 % (ref 12.0–46.0)
Lymphs Abs: 0.9 10*3/uL (ref 0.7–4.0)
MCHC: 34.1 g/dL (ref 30.0–36.0)
MCV: 89.7 fl (ref 78.0–100.0)
MONOS PCT: 13 % — AB (ref 3.0–12.0)
Monocytes Absolute: 0.4 10*3/uL (ref 0.1–1.0)
Neutro Abs: 1.8 10*3/uL (ref 1.4–7.7)
Neutrophils Relative %: 55.6 % (ref 43.0–77.0)
PLATELETS: 267 10*3/uL (ref 150.0–400.0)
RBC: 4.88 Mil/uL (ref 4.22–5.81)
RDW: 13.9 % (ref 11.5–15.5)
WBC: 3.3 10*3/uL — ABNORMAL LOW (ref 4.0–10.5)

## 2018-10-03 LAB — URINALYSIS, ROUTINE W REFLEX MICROSCOPIC
BILIRUBIN URINE: NEGATIVE
Hgb urine dipstick: NEGATIVE
Ketones, ur: NEGATIVE
NITRITE: NEGATIVE
PH: 6 (ref 5.0–8.0)
RBC / HPF: NONE SEEN (ref 0–?)
Specific Gravity, Urine: 1.015 (ref 1.000–1.030)
Total Protein, Urine: NEGATIVE
UROBILINOGEN UA: 0.2 (ref 0.0–1.0)
Urine Glucose: NEGATIVE

## 2018-10-03 LAB — HEPATIC FUNCTION PANEL
ALBUMIN: 4.4 g/dL (ref 3.5–5.2)
ALT: 14 U/L (ref 0–53)
AST: 13 U/L (ref 0–37)
Alkaline Phosphatase: 82 U/L (ref 39–117)
Bilirubin, Direct: 0.1 mg/dL (ref 0.0–0.3)
Total Bilirubin: 0.5 mg/dL (ref 0.2–1.2)
Total Protein: 7.4 g/dL (ref 6.0–8.3)

## 2018-10-03 LAB — SEDIMENTATION RATE: Sed Rate: 8 mm/hr (ref 0–20)

## 2018-10-03 MED ORDER — PSYLLIUM 0.52 G PO CAPS: 0.5200 g | ORAL_CAPSULE | Freq: Two times a day (BID) | ORAL | 11 refills | Status: DC

## 2018-10-03 MED ORDER — GLYCOPYRROLATE 2 MG PO TABS: 2.0000 mg | ORAL_TABLET | Freq: Three times a day (TID) | ORAL | 3 refills | Status: DC | PRN

## 2018-10-03 NOTE — Patient Instructions (Signed)
Diverticulosis Diverticulosis is a condition that develops when small pouches (diverticula) form in the wall of the large intestine (colon). The colon is where water is absorbed and stool is formed. The pouches form when the inside layer of the colon pushes through weak spots in the outer layers of the colon. You may have a few pouches or many of them. What are the causes? The cause of this condition is not known. What increases the risk? The following factors may make you more likely to develop this condition:  Being older than age 89. Your risk for this condition increases with age. Diverticulosis is rare among people younger than age 14. By age 50, many people have it.  Eating a low-fiber diet.  Having frequent constipation.  Being overweight.  Not getting enough exercise.  Smoking.  Taking over-the-counter pain medicines, like aspirin and ibuprofen.  Having a family history of diverticulosis.  What are the signs or symptoms? In most people, there are no symptoms of this condition. If you do have symptoms, they may include:  Bloating.  Cramps in the abdomen.  Constipation or diarrhea.  Pain in the lower left side of the abdomen.  How is this diagnosed? This condition is most often diagnosed during an exam for other colon problems. Because diverticulosis usually has no symptoms, it often cannot be diagnosed independently. This condition may be diagnosed by:  Using a flexible scope to examine the colon (colonoscopy).  Taking an X-ray of the colon after dye has been put into the colon (barium enema).  Doing a CT scan.  How is this treated? You may not need treatment for this condition if you have never developed an infection related to diverticulosis. If you have had an infection before, treatment may include:  Eating a high-fiber diet. This may include eating more fruits, vegetables, and grains.  Taking a fiber supplement.  Taking a live bacteria supplement  (probiotic).  Taking medicine to relax your colon.  Taking antibiotic medicines.  Follow these instructions at home:  Drink 6-8 glasses of water or more each day to prevent constipation.  Try not to strain when you have a bowel movement.  If you have had an infection before: ? Eat more fiber as directed by your health care provider or your diet and nutrition specialist (dietitian). ? Take a fiber supplement or probiotic, if your health care provider approves.  Take over-the-counter and prescription medicines only as told by your health care provider.  If you were prescribed an antibiotic, take it as told by your health care provider. Do not stop taking the antibiotic even if you start to feel better.  Keep all follow-up visits as told by your health care provider. This is important. Contact a health care provider if:  You have pain in your abdomen.  You have bloating.  You have cramps.  You have not had a bowel movement in 3 days. Get help right away if:  Your pain gets worse.  Your bloating becomes very bad.  You have a fever or chills, and your symptoms suddenly get worse.  You vomit.  You have bowel movements that are bloody or black.  You have bleeding from your rectum. Summary  Diverticulosis is a condition that develops when small pouches (diverticula) form in the wall of the large intestine (colon).  You may have a few pouches or many of them.  This condition is most often diagnosed during an exam for other colon problems.  If you have  had an infection related to diverticulosis, treatment may include increasing the fiber in your diet, taking supplements, or taking medicines. This information is not intended to replace advice given to you by your health care provider. Make sure you discuss any questions you have with your health care provider. Document Released: 08/12/2004 Document Revised: 10/04/2016 Document Reviewed: 10/04/2016 Elsevier Interactive  Patient Education  2017 [employer]       Gluten free trial for 4-6 weeks. OK to use gluten-free bread and gluten-free pasta.    Gluten-Free Diet for Celiac Disease, Adult The gluten-free diet includes all foods that do not contain gluten. Gluten is a protein that is found in wheat, rye, barley, and some other grains. Following the gluten-free diet is the only treatment for people with celiac disease. It helps to prevent damage to the intestines and improves or eliminates the symptoms of celiac disease. Following the gluten-free diet requires some planning. It can be challenging at first, but it gets easier with time and practice. There are more gluten-free options available today than ever before. If you need help finding gluten-free foods or if you have questions, talk with your diet and nutrition specialist (registered dietitian) or your health care provider. What do I need to know about a gluten-free diet?  All fruits, vegetables, and meats are safe to eat and do not contain gluten.  When grocery shopping, start by shopping in the produce, meat, and dairy sections. These sections are more likely to contain gluten-free foods. Then move to the aisles that contain packaged foods if you need to.  Read all food labels. Gluten is often added to foods. Always check the ingredient list and look for warnings, such as "may contain gluten."  Talk with your dietitian or health care provider before taking a gluten-free multivitamin or mineral supplement.  Be aware of gluten-free foods having contact with foods that contain gluten (cross-contamination). This can happen at home and with any processed foods. ? Talk with your health care provider or dietitian about how to reduce the risk of cross-contamination in your home. ? If you have questions about how a food is processed, ask the manufacturer. What key words help to identify gluten? Foods that list any of these key words on the label usually  contain gluten:  Wheat, flour, enriched flour, bromated flour, white flour, durum flour, graham flour, phosphated flour, self-rising flour, semolina, farina, barley (malt), rye, and oats.  Starch, dextrin, modified food starch, or cereal.  Thickening, fillers, or emulsifiers.  Malt flavoring, malt extract, or malt syrup.  Hydrolyzed vegetable protein.  In the [place] packaged foods that are gluten-free are required to be labeled "GF." These foods should be easy to identify and are safe to eat. In the [place] food companies are also required to list common food allergens, including wheat, on their labels. Recommended foods Grains  Amaranth, bean flours, 100% buckwheat flour, corn, millet, nut flours or nut meals, GF oats, quinoa, rice, sorghum, teff, rice wafers, pure cornmeal tortillas, popcorn, and hot cereals made from cornmeal. Hominy, rice, wild rice. Some Asian rice noodles or bean noodles. Arrowroot starch, corn bran, corn flour, corn germ, cornmeal, corn starch, potato flour, potato starch flour, and rice bran. Plain, brown, and sweet rice flours. Rice polish, soy flour, and tapioca starch. Vegetables  All plain fresh, frozen, and canned vegetables. Fruits  All plain fresh, frozen, canned, and dried fruits, and 100% fruit juices. Meats and other protein foods  All fresh beef, pork, poultry, fish, seafood,  and eggs. Fish canned in water, oil, brine, or vegetable broth. Plain nuts and seeds, peanut butter. Some lunch meat and some frankfurters. Dried beans, dried peas, and lentils. Dairy  Fresh plain, dry, evaporated, or condensed milk. Cream, butter, sour cream, whipping cream, and most yogurts. Unprocessed cheese, most processed cheeses, some cottage cheese, some cream cheeses. Beverages  Coffee, tea, most herbal teas. Carbonated beverages and some root beers. Wine, sake, and distilled spirits, such as gin, vodka, and whiskey. Most hard ciders. Fats and oils  Butter, margarine,  vegetable oil, hydrogenated butter, olive oil, shortening, lard, cream, and some mayonnaise. Some commercial salad dressings. Olives. Sweets and desserts  Sugar, honey, some syrups, molasses, jelly, and jam. Plain hard candy, marshmallows, and gumdrops. Pure cocoa powder. Plain chocolate. Custard and some pudding mixes. Gelatin desserts, sorbets, frozen ice pops, and sherbet. Cake, cookies, and other desserts prepared with allowed flours. Some commercial ice creams. Cornstarch, tapioca, and rice puddings. Seasoning and other foods  Some canned or frozen soups. Monosodium glutamate (MSG). Cider, rice, and wine vinegar. Baking soda and baking powder. Cream of tartar. Baking and nutritional yeast. Certain soy sauces made without wheat (ask your dietitian about specific brands that are allowed). Nuts, coconut, and chocolate. Salt, pepper, herbs, spices, flavoring extracts, imitation or artificial flavorings, natural flavorings, and food colorings. Some medicines and supplements. Some lip glosses and other cosmetics. Rice syrups. The items listed may not be a complete list. Talk with your dietitian about what dietary choices are best for you. Foods to avoid Grains  Barley, bran, bulgur, couscous, cracked wheat, [place], farro, graham, malt, matzo, semolina, wheat germ, and all wheat and rye cereals including spelt and kamut. Cereals containing malt as a flavoring, such as rice cereal. Noodles, spaghetti, macaroni, most packaged rice mixes, and all mixes containing wheat, rye, barley, or triticale. Vegetables  Most creamed vegetables and most vegetables canned in sauces. Some commercially prepared vegetables and salads. Fruits  Thickened or prepared fruits and some pie fillings. Some fruit snacks and fruit roll-ups. Meats and other protein foods  Any meat or meat alternative containing wheat, rye, barley, or gluten stabilizers. These are often marinated or packaged meats and lunch meats.  Bread-containing products, such as Swiss steak, croquettes, meatballs, and meatloaf. Most tuna canned in vegetable broth and [place] with hydrolyzed vegetable protein (HVP) injected as part of the basting. Seitan. Imitation fish. Eggs in sauces made from ingredients to avoid. Dairy  Commercial chocolate milk drinks and malted milk. Some non-dairy creamers. Any cheese product containing ingredients to avoid. Beverages  Certain cereal beverages. Beer, ale, malted milk, and some root beers. Some hard ciders. Some instant flavored coffees. Some herbal teas made with barley or with barley malt added. Fats and oils  Some commercial salad dressings. Sour cream containing modified food starch. Sweets and desserts  Some toffees. Chocolate-coated nuts (may be rolled in wheat flour) and some commercial candies and candy bars. Most cakes, cookies, donuts, pastries, and other baked goods. Some commercial ice cream. Ice cream cones. Commercially prepared mixes for cakes, cookies, and other desserts. Bread pudding and other puddings thickened with flour. Products containing brown rice syrup made with barley malt enzyme. Desserts and sweets made with malt flavoring. Seasoning and other foods  Some curry powders, some dry seasoning mixes, some gravy extracts, some meat sauces, some ketchups, some prepared mustards, and horseradish. Certain soy sauces. Malt vinegar. Bouillon and bouillon cubes that contain HVP. Some chip dips, and some chewing gum. Yeast extract. Brewer's yeast.  Caramel color. Some medicines and supplements. Some lip glosses and other cosmetics. The items listed may not be a complete list. Talk with your dietitian about what dietary choices are best for you. Summary  Gluten is a protein that is found in wheat, rye, barley, and some other grains. The gluten-free diet includes all foods that do not contain gluten.  If you need help finding gluten-free foods or if you have questions, talk with your  diet and nutrition specialist (registered dietitian) or your health care provider.  Read all food labels. Gluten is often added to foods. Always check the ingredient list and look for warnings, such as "may contain gluten." This information is not intended to replace advice given to you by your health care provider. Make sure you discuss any questions you have with your health care provider. Document Released: 11/15/2005 Document Revised: 08/30/2016 Document Reviewed: 08/30/2016 Elsevier Interactive Patient Education  2018 [employer].

## 2018-10-03 NOTE — Progress Notes (Signed)
Subjective:  Patient ID: Jorge Turner, male    DOB: Sep 23, 1956  Age: 62 y.o. MRN: [id number]  CC: No chief complaint on file.   HPI Jorge Turner presents for abd pain x several months - worse now. The pain is sharp x 2 min - periodic. Waking up at night. Pain can be sever  No triggers No fever, chills CT abd 08/10/18 diverticulosis Comes w/wife  Outpatient Medications Prior to Visit  Medication Sig Dispense Refill  . cholecalciferol (VITAMIN D) 1000 UNITS tablet Take 1,000 Units by mouth daily.      . clobetasol ointment (TEMOVATE) 0.05 % APPLY TO AFFECTED AREA TWICE A DAY 30 g 0  . Cyanocobalamin (VITAMIN B-12) 1000 MCG SUBL Place 1 tablet (1,000 mcg total) under the tongue daily. 100 tablet 3  . diclofenac sodium (VOLTAREN) 1 % GEL APPLY 2 GRAMS TO AFFECTED AREA 4 TIMES A DAY 300 g 2  . finasteride (PROSCAR) 5 MG tablet TAKE 1 TABLET EVERY DAY 90 tablet 3  . gabapentin (NEURONTIN) 100 MG capsule Take 1 capsule (100 mg total) by mouth 3 (three) times daily as needed. 270 capsule 1  . HYDROcodone-ibuprofen (VICOPROFEN) 7.5-200 MG tablet Take 1 tablet by mouth every 4 (four) hours as needed for moderate pain. 30 tablet 0  . hydrocortisone (ANUSOL-HC) 2.5 % rectal cream APPLY TO AFFECTED AREA TWICE A DAY AS NEEDED 30 g 2  . hydrOXYzine (ATARAX/VISTARIL) 25 MG tablet TAKE 1-2 TABLETS (25-50 MG TOTAL) BY MOUTH AT BEDTIME AS NEEDED FOR ITCHING. 180 tablet 0  . mometasone (ELOCON) 0.1 % lotion Apply topically daily. 60 mL 2  . naproxen (NAPROSYN) 500 MG tablet TAKE 1 TABLET (500 MG TOTAL) BY MOUTH 2 (TWO) TIMES DAILY WITH A MEAL. 60 tablet 1  . omeprazole (PRILOSEC) 40 MG capsule TAKE 1 CAPSULE BY MOUTH EVERY DAY 90 capsule 3  . Probiotic Product (ALIGN) 4 MG CAPS Take 1 capsule (4 mg total) by mouth daily. 30 capsule 5  . tamsulosin (FLOMAX) 0.4 MG CAPS capsule Take 1 capsule (0.4 mg total) by mouth daily. 90 capsule 3  . valACYclovir (VALTREX) 1000 MG tablet Take 2t po q12h times 2 doses.  Repeat with next fever blister episode. 20 tablet 3   No facility-administered medications prior to visit.     ROS: Review of Systems  Constitutional: Negative for appetite change, fatigue and unexpected weight change.  HENT: Negative for congestion, nosebleeds, sneezing, sore throat and trouble swallowing.   Eyes: Negative for itching and visual disturbance.  Respiratory: Negative for cough.   Cardiovascular: Negative for chest pain, palpitations and leg swelling.  Gastrointestinal: Positive for abdominal pain. Negative for abdominal distention, blood in stool, constipation, diarrhea, nausea, rectal pain and vomiting.  Genitourinary: Negative for decreased urine volume, frequency, hematuria and testicular pain.  Musculoskeletal: Positive for arthralgias. Negative for back pain, gait problem, joint swelling and neck pain.  Skin: Negative for rash.  Neurological: Negative for dizziness, tremors, speech difficulty, weakness and light-headedness.  Psychiatric/Behavioral: Negative for agitation, dysphoric mood and sleep disturbance. The patient is not nervous/anxious.     Objective:  BP 124/76 (BP Location: Left Arm, Patient Position: Sitting, Cuff Size: Large)   Pulse 69   Temp 98.2 F (36.8 C) (Oral)   Ht 5\' 10"  (1.778 m)   Wt 168 lb (76.2 kg)   SpO2 98%   BMI 24.11 kg/m   BP Readings from Last 3 Encounters:  10/03/18 124/76  09/04/18 131/81  08/15/18 105/64  Wt Readings from Last 3 Encounters:  10/03/18 168 lb (76.2 kg)  09/04/18 165 lb (74.8 kg)  08/15/18 165 lb (74.8 kg)    Physical Exam  Constitutional: He is oriented to person, place, and time. He appears well-developed. No distress.  NAD  HENT:  Mouth/Throat: Oropharynx is clear and moist.  Eyes: Pupils are equal, round, and reactive to light. Conjunctivae are normal.  Neck: Normal range of motion. No JVD present. No thyromegaly present.  Cardiovascular: Normal rate, regular rhythm, normal heart sounds and  intact distal pulses. Exam reveals no gallop and no friction rub.  No murmur heard. Pulmonary/Chest: Effort normal and breath sounds normal. No respiratory distress. He has no wheezes. He has no rales. He exhibits no tenderness.  Abdominal: Soft. Bowel sounds are normal. He exhibits no distension and no mass. There is no tenderness. There is no rebound and no guarding.  Musculoskeletal: Normal range of motion. He exhibits no edema or tenderness.  Lymphadenopathy:    He has no cervical adenopathy.  Neurological: He is alert and oriented to person, place, and time. He has normal reflexes. No cranial nerve deficit. He exhibits normal muscle tone. He displays a negative Romberg sign. Coordination and gait normal.  Skin: Skin is warm and dry. No rash noted.  Psychiatric: He has a normal mood and affect. His behavior is normal. Judgment and thought content normal.   abd is sensitive in the RLQ and LLQ No mass  Lab Results  Component Value Date   WBC 3.2 (L) 02/08/2018   HGB 14.8 02/08/2018   HCT 44.5 02/08/2018   PLT 244.0 02/08/2018   GLUCOSE 99 02/08/2018   CHOL 187 02/08/2018   TRIG 92.0 02/08/2018   HDL 55.50 02/08/2018   LDLDIRECT 140.3 10/26/2011   LDLCALC 113 (H) 02/08/2018   ALT 11 02/08/2018   AST 9 02/08/2018   NA 140 02/08/2018   K 4.4 02/08/2018   CL 106 02/08/2018   CREATININE 1.24 02/08/2018   BUN 17 02/08/2018   CO2 27 02/08/2018   TSH 3.94 02/08/2018   PSA 4.13 (H) 02/08/2018    No results found.  Assessment & Plan:   There are no diagnoses linked to this encounter.   No orders of the defined types were placed in this encounter.    Follow-up: No follow-ups on file.  Walker Kehr, MD

## 2018-10-03 NOTE — Assessment & Plan Note (Signed)
Extensive Metamucil qd

## 2018-10-03 NOTE — Assessment & Plan Note (Addendum)
?  etiology  Metamucil qd Allergy tests - food Gluten free diet GI ref if not better Robinul prn RTC 6 wks

## 2018-10-05 LAB — ALLERGEN FOOD PROFILE SPECIFIC IGE
Allergen Apple, IgE: <0.1 kU/L
Allergen Corn, IgE: <0.1 kU/L
Allergen Tomato, IgE: <0.1 kU/L
Chicken IgE: <0.1 kU/L
Codfish IgE: <0.1 kU/L
Egg White IgE: <0.1 kU/L
IgE (Immunoglobulin E), Serum: 10 IU/mL (ref 6–495)
Milk IgE: <0.1 kU/L
Peanut IgE: <0.1 kU/L

## 2018-10-10 DIAGNOSIS — R300 Dysuria: Secondary | ICD-10-CM | POA: Diagnosis not present

## 2018-10-10 DIAGNOSIS — R1031 Right lower quadrant pain: Secondary | ICD-10-CM | POA: Diagnosis not present

## 2018-10-13 DIAGNOSIS — R1031 Right lower quadrant pain: Secondary | ICD-10-CM | POA: Diagnosis not present

## 2018-10-13 DIAGNOSIS — K5790 Diverticulosis of intestine, part unspecified, without perforation or abscess without bleeding: Secondary | ICD-10-CM | POA: Diagnosis not present

## 2018-10-13 DIAGNOSIS — Z79899 Other long term (current) drug therapy: Secondary | ICD-10-CM | POA: Diagnosis not present

## 2018-10-13 DIAGNOSIS — Z888 Allergy status to other drugs, medicaments and biological substances status: Secondary | ICD-10-CM | POA: Diagnosis not present

## 2018-10-13 DIAGNOSIS — N281 Cyst of kidney, acquired: Secondary | ICD-10-CM | POA: Diagnosis not present

## 2018-10-13 DIAGNOSIS — Z87892 Personal history of anaphylaxis: Secondary | ICD-10-CM | POA: Diagnosis not present

## 2018-10-13 DIAGNOSIS — K5730 Diverticulosis of large intestine without perforation or abscess without bleeding: Secondary | ICD-10-CM | POA: Diagnosis not present

## 2018-10-16 ENCOUNTER — Ambulatory Visit (INDEPENDENT_AMBULATORY_CARE_PROVIDER_SITE_OTHER): Payer: Federal, State, Local not specified - PPO | Admitting: Specialist

## 2018-10-16 ENCOUNTER — Encounter (INDEPENDENT_AMBULATORY_CARE_PROVIDER_SITE_OTHER): Payer: Self-pay | Admitting: Specialist

## 2018-10-16 ENCOUNTER — Ambulatory Visit (INDEPENDENT_AMBULATORY_CARE_PROVIDER_SITE_OTHER): Payer: Self-pay

## 2018-10-16 VITALS — BP 116/70 | HR 70 | Ht 70.0 in | Wt 165.0 lb

## 2018-10-16 DIAGNOSIS — M19031 Primary osteoarthritis, right wrist: Secondary | ICD-10-CM

## 2018-10-16 DIAGNOSIS — M545 Low back pain: Secondary | ICD-10-CM | POA: Diagnosis not present

## 2018-10-16 DIAGNOSIS — M47812 Spondylosis without myelopathy or radiculopathy, cervical region: Secondary | ICD-10-CM | POA: Diagnosis not present

## 2018-10-16 DIAGNOSIS — G5621 Lesion of ulnar nerve, right upper limb: Secondary | ICD-10-CM

## 2018-10-16 DIAGNOSIS — M7701 Medial epicondylitis, right elbow: Secondary | ICD-10-CM | POA: Diagnosis not present

## 2018-10-16 NOTE — Patient Instructions (Signed)
Plan: Avoid frequent bending and stooping  No lifting greater than 10 lbs. May use ice or moist heat for pain. Weight loss is of benefit. Best medication for lumbar disc disease is arthritis medications like motrin, celebrex and naprosyn. Exercise is important to improve your indurance and does allow people to function better inspite of back pain.  Exercises for the wrist and the elbow and use of transdermal voltaren gel up to 3-4 times per day.  MRI of the right wrist and right elbow are ordered. Marland Kitchen
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
## 2018-10-19 ENCOUNTER — Ambulatory Visit: Payer: Federal, State, Local not specified - PPO | Admitting: Internal Medicine

## 2018-10-23 ENCOUNTER — Ambulatory Visit: Payer: Federal, State, Local not specified - PPO | Admitting: Internal Medicine

## 2018-10-23 ENCOUNTER — Encounter: Payer: Self-pay | Admitting: Internal Medicine

## 2018-10-23 VITALS — BP 116/68 | HR 70 | Temp 98.0°F | Ht 70.0 in | Wt 170.0 lb

## 2018-10-23 DIAGNOSIS — E538 Deficiency of other specified B group vitamins: Secondary | ICD-10-CM | POA: Diagnosis not present

## 2018-10-23 DIAGNOSIS — R300 Dysuria: Secondary | ICD-10-CM | POA: Diagnosis not present

## 2018-10-23 DIAGNOSIS — R42 Dizziness and giddiness: Secondary | ICD-10-CM | POA: Insufficient documentation

## 2018-10-23 DIAGNOSIS — R1084 Generalized abdominal pain: Secondary | ICD-10-CM

## 2018-10-23 MED ORDER — MECLIZINE HCL 12.5 MG PO TABS: 12.5000 mg | ORAL_TABLET | Freq: Three times a day (TID) | ORAL | 1 refills | Status: AC | PRN

## 2018-10-23 MED ORDER — CIPROFLOXACIN HCL 500 MG PO TABS: 500.0000 mg | ORAL_TABLET | Freq: Two times a day (BID) | ORAL | 0 refills | Status: DC
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
## 2018-10-23 NOTE — Assessment & Plan Note (Addendum)
CT --  IMPRESSION: 1. No acute abnormality in the abdomen or pelvis. 2. Extensive colonic diverticulosis without CT evidence of diverticulitis.   ??UTI - ?prostatitis - po abx Cipro

## 2018-10-23 NOTE — Assessment & Plan Note (Signed)
On B12 

## 2018-10-23 NOTE — Assessment & Plan Note (Addendum)
??  UTI - ?prostatitis - po abx Cipro

## 2018-10-23 NOTE — Assessment & Plan Note (Signed)
BPV - recurrent x years Meclizine prn

## 2018-10-28 ENCOUNTER — Ambulatory Visit
Admission: RE | Admit: 2018-10-28 | Discharge: 2018-10-28 | Disposition: A | Discharge status: Home / Self Care | Payer: Federal, State, Local not specified - PPO | Source: Ambulatory Visit | Attending: Specialist | Admitting: Specialist

## 2018-10-28 DIAGNOSIS — M85431 Solitary bone cyst, right ulna and radius: Secondary | ICD-10-CM | POA: Diagnosis not present

## 2018-10-28 DIAGNOSIS — M7701 Medial epicondylitis, right elbow: Secondary | ICD-10-CM

## 2018-10-28 DIAGNOSIS — M19031 Primary osteoarthritis, right wrist: Secondary | ICD-10-CM | POA: Diagnosis not present

## 2018-10-28 DIAGNOSIS — R600 Localized edema: Secondary | ICD-10-CM | POA: Diagnosis not present

## 2018-10-28 DIAGNOSIS — G5621 Lesion of ulnar nerve, right upper limb: Secondary | ICD-10-CM

## 2018-11-01 ENCOUNTER — Other Ambulatory Visit: Payer: Self-pay | Admitting: Internal Medicine

## 2018-11-01 DIAGNOSIS — L299 Pruritus, unspecified: Secondary | ICD-10-CM

## 2018-11-03 ENCOUNTER — Other Ambulatory Visit: Payer: Self-pay | Admitting: Internal Medicine

## 2018-11-08 ENCOUNTER — Other Ambulatory Visit (INDEPENDENT_AMBULATORY_CARE_PROVIDER_SITE_OTHER): Payer: Self-pay | Admitting: Specialist

## 2018-11-08 ENCOUNTER — Ambulatory Visit: Payer: Federal, State, Local not specified - PPO | Admitting: Internal Medicine

## 2018-11-08 ENCOUNTER — Encounter: Payer: Self-pay | Admitting: Internal Medicine

## 2018-11-08 VITALS — BP 110/80 | HR 68 | Temp 97.8°F | Ht 70.0 in | Wt 169.0 lb

## 2018-11-08 DIAGNOSIS — B009 Herpesviral infection, unspecified: Secondary | ICD-10-CM

## 2018-11-08 DIAGNOSIS — M549 Dorsalgia, unspecified: Secondary | ICD-10-CM

## 2018-11-08 MED ORDER — IBUPROFEN-FAMOTIDINE 800-26.6 MG PO TABS: 1.0000 | ORAL_TABLET | Freq: Three times a day (TID) | ORAL | 0 refills | Status: DC

## 2018-11-08 MED ORDER — CYCLOBENZAPRINE HCL 5 MG PO TABS: 5.0000 mg | ORAL_TABLET | Freq: Three times a day (TID) | ORAL | 1 refills | Status: DC | PRN

## 2018-11-08 MED ORDER — VALACYCLOVIR HCL 1 G PO TABS: ORAL_TABLET | ORAL | 3 refills | Status: DC

## 2018-11-08 NOTE — Telephone Encounter (Signed)
Naproxen, and Diclofenac [place]ell refill requests

## 2018-11-08 NOTE — Telephone Encounter (Signed)
Okay to refill the naproxen for now.  Hold on Voltaren gel until return office visit with Dr. Louanne Skye to review MRIs

## 2018-11-08 NOTE — Patient Instructions (Signed)
This can take 2-4 weeks to get better.   We have sent in refill of duexis to take for pain. We have also sent in a muscle relaxer called flexeril to use up to twice a day if needed for pain. This can make you drowsy so do not take and work.

## 2018-11-08 NOTE — Assessment & Plan Note (Signed)
Refill duexis and rx for flexeril. Work note given. Referral for PT to help with pain given that this has helped him in the past.
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
## 2018-11-09 ENCOUNTER — Ambulatory Visit: Payer: Federal, State, Local not specified - PPO | Admitting: Internal Medicine

## 2018-11-20 ENCOUNTER — Ambulatory Visit (INDEPENDENT_AMBULATORY_CARE_PROVIDER_SITE_OTHER): Payer: Federal, State, Local not specified - PPO | Admitting: Specialist

## 2018-11-20 ENCOUNTER — Encounter (INDEPENDENT_AMBULATORY_CARE_PROVIDER_SITE_OTHER): Payer: Self-pay | Admitting: Specialist

## 2018-11-20 VITALS — BP 125/82 | HR 60 | Ht 70.0 in | Wt 169.0 lb

## 2018-11-20 DIAGNOSIS — S139XXA Sprain of joints and ligaments of unspecified parts of neck, initial encounter: Secondary | ICD-10-CM | POA: Diagnosis not present

## 2018-11-20 DIAGNOSIS — M542 Cervicalgia: Secondary | ICD-10-CM | POA: Diagnosis not present

## 2018-11-20 DIAGNOSIS — S46911A Strain of unspecified muscle, fascia and tendon at shoulder and upper arm level, right arm, initial encounter: Secondary | ICD-10-CM

## 2018-11-20 DIAGNOSIS — M545 Low back pain: Secondary | ICD-10-CM | POA: Diagnosis not present
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
## 2018-11-20 NOTE — Patient Instructions (Signed)
Plan: Avoid frequent bending and stooping  No lifting greater than 10 lbs. May use ice or moist heat for pain. Weight loss is of benefit. Best medication for lumbar disc disease is arthritis medications like motrin, celebrex and naprosyn. Exercise is important to improve your indurance and does allow people to function better inspite of back pain. For the right hand iontophoresis  And right elbow iontophoresis to the cubital tunnel area and the  Medial epicondyle can improve the pain assosiated with tennis elbow. Warm soaks for the hands is of benefit and heat to the hands with soaking helps as does transdermal voltaren gel.

## 2018-11-23 DIAGNOSIS — M542 Cervicalgia: Secondary | ICD-10-CM | POA: Diagnosis not present

## 2018-11-30 ENCOUNTER — Ambulatory Visit (INDEPENDENT_AMBULATORY_CARE_PROVIDER_SITE_OTHER): Payer: Federal, State, Local not specified - PPO | Admitting: Orthopedic Surgery

## 2018-12-04 DIAGNOSIS — M25562 Pain in left knee: Secondary | ICD-10-CM | POA: Diagnosis not present

## 2018-12-04 DIAGNOSIS — M545 Low back pain: Secondary | ICD-10-CM | POA: Diagnosis not present

## 2018-12-04 DIAGNOSIS — M542 Cervicalgia: Secondary | ICD-10-CM | POA: Diagnosis not present

## 2018-12-04 DIAGNOSIS — M25561 Pain in right knee: Secondary | ICD-10-CM | POA: Diagnosis not present

## 2018-12-11 DIAGNOSIS — M545 Low back pain: Secondary | ICD-10-CM | POA: Diagnosis not present

## 2018-12-11 DIAGNOSIS — M25562 Pain in left knee: Secondary | ICD-10-CM | POA: Diagnosis not present

## 2018-12-11 DIAGNOSIS — G8929 Other chronic pain: Secondary | ICD-10-CM | POA: Diagnosis not present

## 2018-12-11 DIAGNOSIS — M25561 Pain in right knee: Secondary | ICD-10-CM | POA: Diagnosis not present

## 2018-12-11 DIAGNOSIS — Z13220 Encounter for screening for lipoid disorders: Secondary | ICD-10-CM | POA: Diagnosis not present

## 2018-12-11 DIAGNOSIS — Z125 Encounter for screening for malignant neoplasm of prostate: Secondary | ICD-10-CM | POA: Diagnosis not present

## 2018-12-12 DIAGNOSIS — M542 Cervicalgia: Secondary | ICD-10-CM | POA: Diagnosis not present

## 2018-12-12 DIAGNOSIS — M545 Low back pain: Secondary | ICD-10-CM | POA: Diagnosis not present

## 2018-12-13 ENCOUNTER — Ambulatory Visit (INDEPENDENT_AMBULATORY_CARE_PROVIDER_SITE_OTHER): Payer: Federal, State, Local not specified - PPO | Admitting: Orthopedic Surgery

## 2018-12-19 DIAGNOSIS — M542 Cervicalgia: Secondary | ICD-10-CM | POA: Diagnosis not present

## 2018-12-19 DIAGNOSIS — M545 Low back pain: Secondary | ICD-10-CM | POA: Diagnosis not present

## 2018-12-20 ENCOUNTER — Ambulatory Visit (INDEPENDENT_AMBULATORY_CARE_PROVIDER_SITE_OTHER): Payer: Federal, State, Local not specified - PPO | Admitting: Orthopedic Surgery

## 2018-12-22 ENCOUNTER — Ambulatory Visit (INDEPENDENT_AMBULATORY_CARE_PROVIDER_SITE_OTHER): Payer: Federal, State, Local not specified - PPO | Admitting: Specialist

## 2018-12-23 ENCOUNTER — Other Ambulatory Visit: Payer: Self-pay | Admitting: Internal Medicine

## 2018-12-25 ENCOUNTER — Ambulatory Visit (INDEPENDENT_AMBULATORY_CARE_PROVIDER_SITE_OTHER): Payer: Federal, State, Local not specified - PPO | Admitting: Specialist

## 2018-12-25 DIAGNOSIS — J410 Simple chronic bronchitis: Secondary | ICD-10-CM | POA: Diagnosis not present

## 2018-12-25 DIAGNOSIS — J040 Acute laryngitis: Secondary | ICD-10-CM | POA: Diagnosis not present

## 2018-12-25 DIAGNOSIS — J320 Chronic maxillary sinusitis: Secondary | ICD-10-CM | POA: Diagnosis not present

## 2018-12-25 DIAGNOSIS — J322 Chronic ethmoidal sinusitis: Secondary | ICD-10-CM | POA: Diagnosis not present

## 2018-12-26 ENCOUNTER — Other Ambulatory Visit: Payer: Self-pay | Admitting: Internal Medicine

## 2018-12-26 ENCOUNTER — Encounter: Payer: Self-pay | Admitting: Internal Medicine

## 2018-12-26 ENCOUNTER — Ambulatory Visit: Payer: Federal, State, Local not specified - PPO | Admitting: Internal Medicine

## 2018-12-26 VITALS — BP 118/80 | HR 74 | Temp 98.6°F | Ht 70.0 in | Wt 167.0 lb

## 2018-12-26 DIAGNOSIS — M545 Low back pain, unspecified: Secondary | ICD-10-CM

## 2018-12-26 DIAGNOSIS — E538 Deficiency of other specified B group vitamins: Secondary | ICD-10-CM

## 2018-12-26 DIAGNOSIS — M542 Cervicalgia: Secondary | ICD-10-CM | POA: Diagnosis not present

## 2018-12-26 DIAGNOSIS — Z8249 Family history of ischemic heart disease and other diseases of the circulatory system: Secondary | ICD-10-CM | POA: Diagnosis not present

## 2018-12-26 NOTE — Patient Instructions (Addendum)
Weighted blanket  Try yoga  Cardiac CT calcium scoring test $150   Computed tomography, more commonly known as a CT or CAT scan, is a diagnostic medical imaging test. Like traditional x-rays, it produces multiple images or pictures of the inside of the body. The cross-sectional images generated during a CT scan can be reformatted in multiple planes. They can even generate three-dimensional images. These images can be viewed on a computer monitor, printed on film or by a 3D printer, or transferred to a CD or DVD. CT images of internal organs, bones, soft tissue and blood vessels provide greater detail than traditional x-rays, particularly of soft tissues and blood vessels. A cardiac CT scan for coronary calcium is a non-invasive way of obtaining information about the presence, location and extent of calcified plaque in the coronary arteries-the vessels that supply oxygen-containing blood to the heart muscle. Calcified plaque results when there is a build-up of fat and other substances under the inner layer of the artery. This material can calcify which signals the presence of atherosclerosis, a disease of the vessel wall, also called coronary artery disease (CAD). People with this disease have an increased risk for heart attacks. In addition, over time, progression of plaque build up (CAD) can narrow the arteries or even close off blood flow to the heart. The result may be chest pain, sometimes called "angina," or a heart attack. Because calcium is a marker of CAD, the amount of calcium detected on a cardiac CT scan is a helpful prognostic tool. The findings on cardiac CT are expressed as a calcium score. Another name for this test is coronary artery calcium scoring.  What are some common uses of the procedure? The goal of cardiac CT scan for calcium scoring is to determine if CAD is present and to what extent, even if there are no symptoms. It is a screening study that may be recommended by a physician  for patients with risk factors for CAD but no clinical symptoms. The major risk factors for CAD are: . high blood cholesterol levels  . family history of heart attacks  . diabetes  . high blood pressure  . cigarette smoking  . overweight or obese  . physical inactivity   A negative cardiac CT scan for calcium scoring shows no calcification within the coronary arteries. This suggests that CAD is absent or so minimal it cannot be seen by this technique. The chance of having a heart attack over the next two to five years is very low under these circumstances. A positive test means that CAD is present, regardless of whether or not the patient is experiencing any symptoms. The amount of calcification-expressed as the calcium score-may help to predict the likelihood of a myocardial infarction (heart attack) in the coming years and helps your medical doctor or cardiologist decide whether the patient may need to take preventive medicine or undertake other measures such as diet and exercise to lower the risk for heart attack. The extent of CAD is graded according to your calcium score:  Calcium Score  Presence of CAD  0 No evidence of CAD   1-10 Minimal evidence of CAD  11-100 Mild evidence of CAD  101-400 Moderate evidence of CAD  Over 400 Extensive evidence of CAD
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
## 2018-12-26 NOTE — Assessment & Plan Note (Signed)
Weighted blanket to try

## 2018-12-26 NOTE — Assessment & Plan Note (Signed)
On B12 

## 2018-12-26 NOTE — Assessment & Plan Note (Addendum)
Sister died at 81 - CAD, F - CABG A cardiac CT scan for coronary calcium offered

## 2018-12-26 NOTE — Assessment & Plan Note (Addendum)
Weighted blanket to try Try yoga In PT

## 2018-12-27 DIAGNOSIS — M545 Low back pain: Secondary | ICD-10-CM | POA: Diagnosis not present

## 2018-12-27 DIAGNOSIS — M542 Cervicalgia: Secondary | ICD-10-CM | POA: Diagnosis not present

## 2019-01-01 DIAGNOSIS — M25562 Pain in left knee: Secondary | ICD-10-CM | POA: Diagnosis not present

## 2019-01-01 DIAGNOSIS — M25561 Pain in right knee: Secondary | ICD-10-CM | POA: Diagnosis not present

## 2019-01-01 DIAGNOSIS — Z5189 Encounter for other specified aftercare: Secondary | ICD-10-CM | POA: Diagnosis not present

## 2019-01-02 DIAGNOSIS — M545 Low back pain: Secondary | ICD-10-CM | POA: Diagnosis not present

## 2019-01-02 DIAGNOSIS — M542 Cervicalgia: Secondary | ICD-10-CM | POA: Diagnosis not present

## 2019-01-03 NOTE — Therapy (Signed)
[facility] [address] [place], [place], [address] Phone: [phone]   Fax:  [phone]  Patient Details  Name: Jorge Turner MRN: [id number] Date of Birth: 11/16/1956 Referring Provider:  No ref. provider found  Encounter Date: 01/03/2019  Pt was last seen on 09/18/18 for 19 visits. Pt had only met 2 STG's from renewal (w/ new diagnosis) but did not return after last seen visit, therefore will d/c episode of care at this time.   Carey Bullocks, OTR/L 01/03/2019, 10:50 AM  [facility] [address] [place], [place], [address] Phone: [phone]   Fax:  [phone]

## 2019-01-04 DIAGNOSIS — J320 Chronic maxillary sinusitis: Secondary | ICD-10-CM | POA: Diagnosis not present

## 2019-01-04 DIAGNOSIS — J040 Acute laryngitis: Secondary | ICD-10-CM | POA: Diagnosis not present

## 2019-01-04 DIAGNOSIS — J322 Chronic ethmoidal sinusitis: Secondary | ICD-10-CM | POA: Diagnosis not present

## 2019-01-08 ENCOUNTER — Other Ambulatory Visit (INDEPENDENT_AMBULATORY_CARE_PROVIDER_SITE_OTHER): Payer: Self-pay | Admitting: Surgery

## 2019-01-08 NOTE — Telephone Encounter (Signed)
Dr. Nitka patient. 

## 2019-01-08 NOTE — Telephone Encounter (Signed)
Naproxen refill request 

## 2019-01-09 DIAGNOSIS — M542 Cervicalgia: Secondary | ICD-10-CM | POA: Diagnosis not present

## 2019-01-09 DIAGNOSIS — M545 Low back pain: Secondary | ICD-10-CM | POA: Diagnosis not present

## 2019-01-16 DIAGNOSIS — M545 Low back pain: Secondary | ICD-10-CM | POA: Diagnosis not present

## 2019-01-16 DIAGNOSIS — M542 Cervicalgia: Secondary | ICD-10-CM | POA: Diagnosis not present

## 2019-01-17 ENCOUNTER — Encounter (INDEPENDENT_AMBULATORY_CARE_PROVIDER_SITE_OTHER): Payer: Self-pay | Admitting: Specialist

## 2019-01-17 ENCOUNTER — Ambulatory Visit (INDEPENDENT_AMBULATORY_CARE_PROVIDER_SITE_OTHER): Payer: Federal, State, Local not specified - PPO | Admitting: Specialist

## 2019-01-17 VITALS — BP 112/74 | HR 57 | Ht 70.0 in | Wt 169.0 lb

## 2019-01-17 DIAGNOSIS — M25562 Pain in left knee: Secondary | ICD-10-CM

## 2019-01-17 DIAGNOSIS — M1712 Unilateral primary osteoarthritis, left knee: Secondary | ICD-10-CM | POA: Diagnosis not present

## 2019-01-17 DIAGNOSIS — M19131 Post-traumatic osteoarthritis, right wrist: Secondary | ICD-10-CM

## 2019-01-17 DIAGNOSIS — M1711 Unilateral primary osteoarthritis, right knee: Secondary | ICD-10-CM | POA: Diagnosis not present

## 2019-01-17 DIAGNOSIS — G5621 Lesion of ulnar nerve, right upper limb: Secondary | ICD-10-CM

## 2019-01-17 DIAGNOSIS — M542 Cervicalgia: Secondary | ICD-10-CM

## 2019-01-17 DIAGNOSIS — M25561 Pain in right knee: Secondary | ICD-10-CM

## 2019-01-17 DIAGNOSIS — M5136 Other intervertebral disc degeneration, lumbar region: Secondary | ICD-10-CM

## 2019-01-17 DIAGNOSIS — M47812 Spondylosis without myelopathy or radiculopathy, cervical region: Secondary | ICD-10-CM

## 2019-01-17 DIAGNOSIS — G8929 Other chronic pain: Secondary | ICD-10-CM
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
## 2019-01-23 DIAGNOSIS — M545 Low back pain: Secondary | ICD-10-CM | POA: Diagnosis not present

## 2019-01-23 DIAGNOSIS — M542 Cervicalgia: Secondary | ICD-10-CM | POA: Diagnosis not present

## 2019-01-30 DIAGNOSIS — M545 Low back pain: Secondary | ICD-10-CM | POA: Diagnosis not present

## 2019-01-30 DIAGNOSIS — M542 Cervicalgia: Secondary | ICD-10-CM | POA: Diagnosis not present

## 2019-02-05 ENCOUNTER — Other Ambulatory Visit: Payer: Self-pay

## 2019-02-05 ENCOUNTER — Ambulatory Visit: Payer: Federal, State, Local not specified - PPO | Admitting: Nurse Practitioner

## 2019-02-05 ENCOUNTER — Encounter: Payer: Self-pay | Admitting: Nurse Practitioner

## 2019-02-05 VITALS — BP 100/78 | HR 74 | Temp 98.0°F | Ht 70.0 in | Wt 164.0 lb

## 2019-02-05 DIAGNOSIS — R6889 Other general symptoms and signs: Secondary | ICD-10-CM | POA: Diagnosis not present

## 2019-02-05 NOTE — Patient Instructions (Addendum)
For nasal and head congestion may take Sudafed PE 10 mg every 4 hour as needed. Saline nasal spray used frequently. For drainage may use Allegra, Claritin or Zyrtec. If you need stronger medicine to stop drainage may take Chlor-Trimeton 2-4 mg every 4 hours. This may cause drowsiness. Ibuprofen 600 mg every 6 hours as needed for pain, discomfort or fever. Drink plenty of fluids and stay well-hydrated.   Please follow up for fevers over 101, if your symptoms get worse, or if your symptoms dont get better in 1 week.   Upper Respiratory Infection, Adult An upper respiratory infection (URI) affects the nose, throat, and upper air passages. URIs are caused by germs (viruses). The most common type of URI is often called "the common cold." Medicines cannot cure URIs, but you can do things at home to relieve your symptoms. URIs usually get better within 7-10 days. Follow these instructions at home: Activity  Rest as needed.  If you have a fever, stay home from work or school until your fever is gone, or until your doctor says you may return to work or school. ? You should stay home until you cannot spread the infection anymore (you are not contagious). ? Your doctor may have you wear a face mask so you have less risk of spreading the infection. Relieving symptoms  Gargle with a salt-water mixture 3-4 times a day or as needed. To make a salt-water mixture, completely dissolve -1 tsp of salt in 1 cup of warm water.  Use a cool-mist humidifier to add moisture to the air. This can help you breathe more easily. Eating and drinking   Drink enough fluid to keep your pee (urine) pale yellow.  Eat soups and other clear broths. General instructions   Take over-the-counter and prescription medicines only as told by your doctor. These include cold medicines, fever reducers, and cough suppressants.  Do not use any products that contain nicotine or tobacco. These include cigarettes and  e-cigarettes. If you need help quitting, ask your doctor.  Avoid being where people are smoking (avoid secondhand smoke).  Make sure you get regular shots and get the flu shot every year.  Keep all follow-up visits as told by your doctor. This is important. How to avoid spreading infection to others   Wash your hands often with soap and water. If you do not have soap and water, use hand sanitizer.  Avoid touching your mouth, face, eyes, or nose.  Cough or sneeze into a tissue or your sleeve or elbow. Do not cough or sneeze into your hand or into the air. Contact a doctor if:  You are getting worse, not better.  You have any of these: ? A fever. ? Chills. ? Brown or red mucus in your nose. ? Yellow or brown fluid (discharge)coming from your nose. ? Pain in your face, especially when you bend forward. ? Swollen neck glands. ? Pain with swallowing. ? White areas in the back of your throat. Get help right away if:  You have shortness of breath that gets worse.  You have very bad or constant: ? Headache. ? Ear pain. ? Pain in your forehead, behind your eyes, and over your cheekbones (sinus pain). ? Chest pain.  You have long-lasting (chronic) lung disease along with any of these: ? Wheezing. ? Long-lasting cough. ? Coughing up blood. ? A change in your usual mucus.  You have a stiff neck.  You have changes in your: ? Vision. ? Hearing. ? Thinking. ?  Mood. Summary  An upper respiratory infection (URI) is caused by a germ called a virus. The most common type of URI is often called "the common cold."  URIs usually get better within 7-10 days.  Take over-the-counter and prescription medicines only as told by your doctor. This information is not intended to replace advice given to you by your health care provider. Make sure you discuss any questions you have with your health care provider. Document Released: 05/03/2008 Document Revised: 07/08/2017 Document Reviewed:  07/08/2017 Elsevier Interactive Patient Education  2019 [employer].
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
## 2019-02-09 DIAGNOSIS — M545 Low back pain: Secondary | ICD-10-CM | POA: Diagnosis not present

## 2019-02-09 DIAGNOSIS — M542 Cervicalgia: Secondary | ICD-10-CM | POA: Diagnosis not present

## 2019-02-15 DIAGNOSIS — M542 Cervicalgia: Secondary | ICD-10-CM | POA: Diagnosis not present

## 2019-02-15 DIAGNOSIS — M545 Low back pain: Secondary | ICD-10-CM | POA: Diagnosis not present

## 2019-02-16 ENCOUNTER — Other Ambulatory Visit: Payer: Self-pay | Admitting: Internal Medicine

## 2019-02-28 ENCOUNTER — Other Ambulatory Visit (INDEPENDENT_AMBULATORY_CARE_PROVIDER_SITE_OTHER): Payer: Self-pay | Admitting: Surgery

## 2019-02-28 NOTE — Telephone Encounter (Signed)
Please advise 

## 2019-03-08 ENCOUNTER — Other Ambulatory Visit: Payer: Self-pay | Admitting: Internal Medicine

## 2019-03-08 DIAGNOSIS — L299 Pruritus, unspecified: Secondary | ICD-10-CM

## 2019-03-24 ENCOUNTER — Other Ambulatory Visit: Payer: Self-pay | Admitting: Internal Medicine

## 2019-03-29 ENCOUNTER — Ambulatory Visit: Payer: Federal, State, Local not specified - PPO | Admitting: Internal Medicine

## 2019-04-13 ENCOUNTER — Encounter: Payer: Self-pay | Admitting: Internal Medicine

## 2019-04-13 ENCOUNTER — Ambulatory Visit (INDEPENDENT_AMBULATORY_CARE_PROVIDER_SITE_OTHER): Payer: Federal, State, Local not specified - PPO | Admitting: Internal Medicine

## 2019-04-13 DIAGNOSIS — J0110 Acute frontal sinusitis, unspecified: Secondary | ICD-10-CM | POA: Diagnosis not present

## 2019-04-13 MED ORDER — AMOXICILLIN-POT CLAVULANATE 875-125 MG PO TABS: 1.0000 | ORAL_TABLET | Freq: Two times a day (BID) | ORAL | 0 refills | Status: DC

## 2019-04-13 MED ORDER — FLUTICASONE PROPIONATE 50 MCG/ACT NA SUSP: 2.0000 | Freq: Every day | NASAL | 6 refills | Status: DC

## 2019-04-13 NOTE — Progress Notes (Signed)
Virtual Visit via Video Note  I connected with CHINO SARDO on 04/13/19 at  3:00 PM EDT by a video enabled telemedicine application and verified that I am speaking with the correct person using two identifiers.  The patient and the provider were at separate locations throughout the entire encounter.   I discussed the limitations of evaluation and management by telemedicine and the availability of in person appointments. The patient expressed understanding and agreed to proceed.  History of Present Illness: The patient is a 63 y.o. man with visit for sinus problems. Started 2-3 weeks ago. Had been treated with ceftin for sinus infection for 10 days. He feels that this helped some but did not get rid of it altogether. He is taking allegra daily which has not helped. Has no cough or SOB or fevers or chills. Pain in his sinuses and drainage. Overall it is worsening. Has tried Human resources officer and ceftin  Observations/Objective: Appearance: normal, breathing appears normal, pain in the frontal sinus to self palpation, casual grooming, abdomen does not appear distended, throat normal, memory normal, mental status is A and O times 3  Assessment and Plan: See problem oriented charting  Follow Up Instructions: rx augmentin and flonase  I discussed the assessment and treatment plan with the patient. The patient was provided an opportunity to ask questions and all were answered. The patient agreed with the plan and demonstrated an understanding of the instructions.   The patient was advised to call back or seek an in-person evaluation if the symptoms worsen or if the condition fails to improve as anticipated.  Hoyt Koch, MD

## 2019-04-13 NOTE — Assessment & Plan Note (Signed)
Rx augmentin and flonase. Continue allegra.

## 2019-04-16 ENCOUNTER — Ambulatory Visit: Payer: Self-pay | Admitting: Specialist

## 2019-04-17 ENCOUNTER — Other Ambulatory Visit: Payer: Self-pay | Admitting: Internal Medicine

## 2019-04-18 NOTE — Telephone Encounter (Signed)
Ok to [employer]? It was d/c'd.

## 2019-04-19 ENCOUNTER — Other Ambulatory Visit (INDEPENDENT_AMBULATORY_CARE_PROVIDER_SITE_OTHER): Payer: Self-pay | Admitting: Specialist

## 2019-04-19 NOTE — Telephone Encounter (Signed)
Naproxen refill request 

## 2019-04-20 ENCOUNTER — Ambulatory Visit: Payer: Federal, State, Local not specified - PPO | Admitting: Specialist

## 2019-04-20 ENCOUNTER — Encounter: Payer: Self-pay | Admitting: Specialist

## 2019-04-20 ENCOUNTER — Other Ambulatory Visit: Payer: Self-pay

## 2019-04-20 VITALS — BP 112/71 | HR 60 | Ht 70.0 in | Wt 164.0 lb

## 2019-04-20 DIAGNOSIS — M5136 Other intervertebral disc degeneration, lumbar region: Secondary | ICD-10-CM

## 2019-04-20 DIAGNOSIS — M7701 Medial epicondylitis, right elbow: Secondary | ICD-10-CM | POA: Diagnosis not present

## 2019-04-20 DIAGNOSIS — M19131 Post-traumatic osteoarthritis, right wrist: Secondary | ICD-10-CM

## 2019-04-20 DIAGNOSIS — M4722 Other spondylosis with radiculopathy, cervical region: Secondary | ICD-10-CM | POA: Diagnosis not present

## 2019-04-20 DIAGNOSIS — M51369 Other intervertebral disc degeneration, lumbar region without mention of lumbar back pain or lower extremity pain: Secondary | ICD-10-CM

## 2019-04-20 DIAGNOSIS — M7702 Medial epicondylitis, left elbow: Secondary | ICD-10-CM

## 2019-04-20 DIAGNOSIS — M6281 Muscle weakness (generalized): Secondary | ICD-10-CM

## 2019-04-20 MED ORDER — DICLOFENAC SODIUM 1 % TD GEL: TRANSDERMAL | 3 refills | Status: DC
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
## 2019-04-29 ENCOUNTER — Telehealth: Payer: Self-pay | Admitting: Cardiology

## 2019-04-29 DIAGNOSIS — Z20822 Contact with and (suspected) exposure to covid-19: Secondary | ICD-10-CM

## 2019-04-29 NOTE — Telephone Encounter (Signed)
Spoke with patient regarding recent office visit on 5/22 and possible Covid exposure.  Covid order placed, appt Monday 6/1 @ [facility]

## 2019-04-30 ENCOUNTER — Telehealth: Payer: Self-pay

## 2019-04-30 ENCOUNTER — Other Ambulatory Visit: Payer: Federal, State, Local not specified - PPO

## 2019-04-30 NOTE — Telephone Encounter (Signed)
Pt called stating that he wants to cancel his COVID-19 test. Pt states he has no symptoms. He say his appointment was on 5/22 with ortho. Everyone was masked including himself. Pt was encouraged to call back if he changes his mind about testing.

## 2019-05-13 ENCOUNTER — Other Ambulatory Visit: Payer: Self-pay | Admitting: Internal Medicine

## 2019-05-18 ENCOUNTER — Ambulatory Visit (INDEPENDENT_AMBULATORY_CARE_PROVIDER_SITE_OTHER): Payer: Federal, State, Local not specified - PPO | Admitting: Physical Medicine and Rehabilitation

## 2019-05-18 ENCOUNTER — Other Ambulatory Visit: Payer: Self-pay

## 2019-05-18 ENCOUNTER — Encounter: Payer: Self-pay | Admitting: Physical Medicine and Rehabilitation

## 2019-05-18 DIAGNOSIS — M542 Cervicalgia: Secondary | ICD-10-CM | POA: Diagnosis not present

## 2019-05-18 DIAGNOSIS — M545 Low back pain: Secondary | ICD-10-CM | POA: Diagnosis not present

## 2019-05-18 DIAGNOSIS — R202 Paresthesia of skin: Secondary | ICD-10-CM

## 2019-05-18 NOTE — Progress Notes (Signed)
  Numeric Pain Rating Scale and Functional Assessment Average Pain 6   In the last MONTH (on 0-10 scale) has pain interfered with the following?  1. General activity like being  able to carry out your everyday physical activities such as walking, climbing stairs, carrying groceries, or moving a chair?  Rating(4)     
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
## 2019-05-22 DIAGNOSIS — M542 Cervicalgia: Secondary | ICD-10-CM | POA: Diagnosis not present

## 2019-05-22 DIAGNOSIS — M545 Low back pain: Secondary | ICD-10-CM | POA: Diagnosis not present

## 2019-05-22 NOTE — Procedures (Signed)
EMG & NCV Findings: Evaluation of the right median motor nerve showed prolonged distal onset latency (4.6 ms) and decreased conduction velocity (Elbow-Wrist, 46 m/s).  The right median (across palm) sensory nerve showed prolonged distal peak latency (Wrist, 4.8 ms) and prolonged distal peak latency (Palm, 2.8 ms).  All remaining nerves (as indicated in the following tables) were within normal limits.    Needle evaluation of the right abductor pollicis brevis muscle showed diminished recruitment.  The right Ext Digitorum and the right triceps muscles showed increased insertional activity.  All remaining muscles (as indicated in the following table) showed no evidence of electrical instability.    Impression: The above electrodiagnostic study is ABNORMAL and reveals evidence of a moderate right median nerve entrapment at the wrist (carpal tunnel syndrome) affecting sensory and motor components. **Compared to electrodiagnostic study pre-carpal tunnel release this shows very minimal improvement but some slight improvement.  Given the length of time since the surgery which has been relatively short this is likely not recurrent median neuropathy but is most likely residual and could be related to scar tissue.   There is no significant electrodiagnostic evidence of any other focal nerve entrapment, brachial plexopathy or cervical radiculopathy.   Recommendations: 1.  Follow-up with referring physician.**Compared to electrodiagnostic study pre-carpal tunnel release this shows very minimal improvement but some slight improvement.  Given the length of time since the surgery which has been relatively short this is likely not recurrent median neuropathy but is most likely residual and could be related to scar tissue.  2.  Continue current management of symptoms.  ___________________________ Laurence Spates FAAPMR Board Certified, American Board of Physical Medicine and Rehabilitation    Nerve Conduction  Studies Anti Sensory Summary Table   Stim Site NR Peak (ms) Norm Peak (ms) P-T Amp (V) Norm P-T Amp Site1 Site2 Delta-P (ms) Dist (cm) Vel (m/s) Norm Vel (m/s)  Right Median Acr Palm Anti Sensory (2nd Digit)  31.9C  Wrist    *4.8 <3.6 19.6 >10 Wrist Palm 2.0 0.0    Palm    *2.8 <2.0 4.9         Right Radial Anti Sensory (Base 1st Digit)  32.1C  Wrist    2.0 <3.1 27.9  Wrist Base 1st Digit 2.0 0.0    Right Ulnar Anti Sensory (5th Digit)  32C  Wrist    3.2 <3.7 20.2 >15.0 Wrist 5th Digit 3.2 14.0 44 >38   Motor Summary Table   Stim Site NR Onset (ms) Norm Onset (ms) O-P Amp (mV) Norm O-P Amp Site1 Site2 Delta-0 (ms) Dist (cm) Vel (m/s) Norm Vel (m/s)  Right Median Motor (Abd Poll Brev)  31.8C  Wrist    *4.6 <4.2 6.1 >5 Elbow Wrist 5.2 24.0 *46 >50  Elbow    9.8  5.4         Right Ulnar Motor (Abd Dig Min)  31.1C  Wrist    2.8 <4.2 10.5 >3 B Elbow Wrist 3.9 23.0 59 >53  B Elbow    6.7  10.9  A Elbow B Elbow 1.7 10.0 59 >53  A Elbow    8.4  10.3          EMG   Side Muscle Nerve Root Ins Act Fibs Psw Amp Dur Poly Recrt Int Fraser Din Comment  Right Abd Poll Brev Median C8-T1 Nml Nml Nml Nml Nml 0 *Reduced Nml   Right 1stDorInt Ulnar C8-T1 Nml Nml Nml Nml Nml 0 Nml Nml   Right PronatorTeres  Median C6-7 Nml Nml Nml Nml Nml 0 Nml Nml   Right Biceps Musculocut C5-6 Nml Nml Nml Nml Nml 0 Nml Nml   Right Deltoid Axillary C5-6 Nml Nml Nml Nml Nml 0 Nml Nml   Right Ext Digitorum  Radial (Post Int) C7-8 *Incr Nml Nml Nml Nml 0 Nml Nml   Right Triceps Radial C6-7-8 *Incr Nml Nml Nml Nml 0 Nml Nml     Nerve Conduction Studies Anti Sensory Left/Right Comparison   Stim Site L Lat (ms) R Lat (ms) L-R Lat (ms) L Amp (V) R Amp (V) L-R Amp (%) Site1 Site2 L Vel (m/s) R Vel (m/s) L-R Vel (m/s)  Median Acr Palm Anti Sensory (2nd Digit)  31.9C  Wrist  *4.8   19.6  Wrist Palm     Palm  *2.8   4.9        Radial Anti Sensory (Base 1st Digit)  32.1C  Wrist  2.0   27.9  Wrist Base 1st Digit      Ulnar Anti Sensory (5th Digit)  32C  Wrist  3.2   20.2  Wrist 5th Digit  44    Motor Left/Right Comparison   Stim Site L Lat (ms) R Lat (ms) L-R Lat (ms) L Amp (mV) R Amp (mV) L-R Amp (%) Site1 Site2 L Vel (m/s) R Vel (m/s) L-R Vel (m/s)  Median Motor (Abd Poll Brev)  31.8C  Wrist  *4.6   6.1  Elbow Wrist  *46   Elbow  9.8   5.4        Ulnar Motor (Abd Dig Min)  31.1C  Wrist  2.8   10.5  B Elbow Wrist  59   B Elbow  6.7   10.9  A Elbow B Elbow  59   A Elbow  8.4   10.3           Waveforms:

## 2019-05-28 ENCOUNTER — Ambulatory Visit (INDEPENDENT_AMBULATORY_CARE_PROVIDER_SITE_OTHER): Payer: Federal, State, Local not specified - PPO | Admitting: Internal Medicine

## 2019-05-28 ENCOUNTER — Encounter: Payer: Self-pay | Admitting: Internal Medicine

## 2019-05-28 ENCOUNTER — Other Ambulatory Visit: Payer: Self-pay

## 2019-05-28 DIAGNOSIS — E538 Deficiency of other specified B group vitamins: Secondary | ICD-10-CM | POA: Diagnosis not present

## 2019-05-28 DIAGNOSIS — J310 Chronic rhinitis: Secondary | ICD-10-CM | POA: Diagnosis not present

## 2019-05-28 DIAGNOSIS — J0110 Acute frontal sinusitis, unspecified: Secondary | ICD-10-CM

## 2019-05-28 MED ORDER — CEFDINIR 300 MG PO CAPS: 300.0000 mg | ORAL_CAPSULE | Freq: Two times a day (BID) | ORAL | 0 refills | Status: DC

## 2019-05-28 NOTE — Assessment & Plan Note (Signed)
ON B12 

## 2019-05-28 NOTE — Progress Notes (Signed)
Subjective:  Patient ID: Jorge Turner, male    DOB: 1956-09-01  Age: 63 y.o. MRN: [id number]  CC: No chief complaint on file.   HPI Jorge Turner presents for L frontal HA x 2-3 d off and on for 1-2 months. Pt took Amoxicillin 1 mo ago - it helped a little F/u B12 def,allergies  Outpatient Medications Prior to Visit  Medication Sig Dispense Refill   BREO ELLIPTA 100-25 MCG/INH AEPB INHALE 1 PUFF INTO THE LUNGS EVERY DAY 1 each 11   cholecalciferol (VITAMIN D) 1000 UNITS tablet Take 1,000 Units by mouth daily.       clobetasol ointment (TEMOVATE) 0.05 % APPLY TO AFFECTED AREA TWICE A DAY 30 g 1   Cyanocobalamin (VITAMIN B-12) 1000 MCG SUBL Place 1 tablet (1,000 mcg total) under the tongue daily. 100 tablet 3   diclofenac sodium (VOLTAREN) 1 % GEL APPLY 2 GRAMS TO AFFECTED AREA 4 TIMES A DAY 300 g 3   finasteride (PROSCAR) 5 MG tablet TAKE 1 TABLET BY MOUTH EVERY DAY 90 tablet 3   fluticasone (FLONASE) 50 MCG/ACT nasal spray Place 2 sprays into both nostrils daily. 16 g 6   gabapentin (NEURONTIN) 100 MG capsule Take 1 capsule (100 mg total) by mouth 3 (three) times daily as needed. 270 capsule 1   glycopyrrolate (ROBINUL) 2 MG tablet TAKE 1 TABLET (2 MG TOTAL) BY MOUTH 3 (THREE) TIMES DAILY AS NEEDED (FOR ABDOMINAL CRAMPS). 270 tablet 0   hydrocortisone (ANUSOL-HC) 2.5 % rectal cream APPLY TO AFFECTED AREA TWICE A DAY AS NEEDED 30 g 2   hydrOXYzine (ATARAX/VISTARIL) 25 MG tablet TAKE 1-2 TABLETS (25-50 MG TOTAL) BY MOUTH AT BEDTIME AS NEEDED FOR ITCHING. 180 tablet 0   meclizine (ANTIVERT) 12.5 MG tablet Take 1 tablet (12.5 mg total) by mouth 3 (three) times daily as needed for dizziness. 60 tablet 1   mometasone (ELOCON) 0.1 % lotion Apply topically daily. 60 mL 2   naproxen (NAPROSYN) 500 MG tablet TAKE 1 TABLET (500 MG TOTAL) BY MOUTH 2 (TWO) TIMES DAILY WITH A MEAL. 60 tablet 1   omeprazole (PRILOSEC) 40 MG capsule TAKE 1 CAPSULE BY MOUTH EVERY DAY 90 capsule 3    Probiotic Product (ALIGN) 4 MG CAPS Take 1 capsule (4 mg total) by mouth daily. 30 capsule 5   psyllium (METAMUCIL) 0.52 g capsule Take 1 capsule (0.52 g total) by mouth 2 (two) times daily. 100 capsule 11   tamsulosin (FLOMAX) 0.4 MG CAPS capsule TAKE 1 CAPSULE BY MOUTH EVERY DAY 90 capsule 3   valACYclovir (VALTREX) 1000 MG tablet Take 2t po q12h times 2 doses. Repeat with next fever blister episode. 20 tablet 3   amoxicillin-clavulanate (AUGMENTIN) 875-125 MG tablet Take 1 tablet by mouth 2 (two) times daily. 20 tablet 0   No facility-administered medications prior to visit.     ROS: Review of Systems  Constitutional: Negative for appetite change, fatigue and unexpected weight change.  HENT: Negative for congestion, nosebleeds, sneezing, sore throat and trouble swallowing.   Eyes: Negative for itching and visual disturbance.  Respiratory: Negative for cough.   Cardiovascular: Negative for chest pain, palpitations and leg swelling.  Gastrointestinal: Negative for abdominal distention, blood in stool, diarrhea and nausea.  Genitourinary: Negative for frequency and hematuria.  Musculoskeletal: Negative for back pain, gait problem, joint swelling and neck pain.  Skin: Negative for rash.  Neurological: Positive for headaches. Negative for dizziness, tremors, speech difficulty and weakness.  Psychiatric/Behavioral: Negative for agitation,  dysphoric mood and sleep disturbance. The patient is not nervous/anxious.     Objective:  BP 116/72 (BP Location: Left Arm, Patient Position: Sitting, Cuff Size: Normal)    Pulse 67    Temp 98.1 F (36.7 C) (Oral)    Ht 5\' 10"  (1.778 m)    Wt 163 lb (73.9 kg)    SpO2 97%    BMI 23.39 kg/m   BP Readings from Last 3 Encounters:  05/28/19 116/72  04/20/19 112/71  02/05/19 100/78    Wt Readings from Last 3 Encounters:  05/28/19 163 lb (73.9 kg)  04/20/19 164 lb (74.4 kg)  02/05/19 164 lb (74.4 kg)    Physical Exam  Lab Results  Component  Value Date   WBC 3.3 (L) 10/03/2018   HGB 14.9 10/03/2018   HCT 43.8 10/03/2018   PLT 267.0 10/03/2018   GLUCOSE 110 (H) 10/03/2018   CHOL 187 02/08/2018   TRIG 92.0 02/08/2018   HDL 55.50 02/08/2018   LDLDIRECT 140.3 10/26/2011   LDLCALC 113 (H) 02/08/2018   ALT 14 10/03/2018   AST 13 10/03/2018   NA 139 10/03/2018   K 4.5 10/03/2018   CL 104 10/03/2018   CREATININE 1.44 10/03/2018   BUN 27 (H) 10/03/2018   CO2 29 10/03/2018   TSH 3.94 02/08/2018   PSA 4.13 (H) 02/08/2018    Mr Wrist Right W/o Contrast  Result Date: 10/29/2018 CLINICAL DATA:  Chronic right wrist pain. No recent injury. Evaluate for cysts in the lunate. EXAM: MR OF THE RIGHT WRIST WITHOUT CONTRAST TECHNIQUE: Multiplanar, multisequence MR imaging of the right wrist was performed. No intravenous contrast was administered. COMPARISON:  Plain films the right hand 03/07/2017. FINDINGS: Ligaments: Intact. Triangular fibrocartilage: The central disc of the triangular fibrocartilage is markedly degenerated and thinned. There is likely a least a perforation of the disc. Tendons: Intact and normal in appearance. Carpal tunnel/median nerve: Negative. Status post carpal tunnel release. Guyon's canal: Negative. Joint/cartilage: Cartilage at the first [facility] and scaphoid trapezium trapezoid joints is thinned without focal defect. Mild cartilage thinning is also seen at the radiocarpal joint. Bones/carpal alignment: A tiny focus of T2 hyperintensity is seen in the lunate adjacent to the distal ulna. Tiny focus of subchondral edema is seen in the proximal pole of the scaphoid. Nonunion of a remote ulnar styloid fracture is noted. No acute fracture or worrisome lesion. The patient is ulnar neutral. Other: None. IMPRESSION: Tiny cyst in the lunate adjacent to the ulna and degenerated disc of the triangular fibrocartilage may be due to ulnocarpal abutment although the patient's ulnar neutral. There is likely at least a perforation of the disc of  the triangular fibrocartilage. Mild first CMC and STT osteoarthritis. 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 Electronically Signed   By: Inge Rise M.D.   On: 10/29/2018 09:18   Mr Elbow Right W/o Contrast  Result Date: 10/29/2018 CLINICAL DATA:  Medial right elbow pain with pain, numbness and tingling in the right forearm and wrist. Symptoms are chronic. Question cubital tunnel syndrome. EXAM: MRI OF THE RIGHT ELBOW WITHOUT CONTRAST TECHNIQUE: Multiplanar, multisequence MR imaging of the elbow was performed. No intravenous contrast was administered. COMPARISON:  None. FINDINGS: TENDONS Common forearm flexor origin: Intact and normal in appearance. Common forearm extensor origin: Minimally increased T2 signal in the common extensor origin is identified. No tendon tear. Biceps: Normal. Triceps: Normal. LIGAMENTS Medial stabilizers: Normal. Lateral stabilizers:  Normal. Cartilage: Normal. Joint: No effusion. Cubital tunnel: No fluid collection mass  or muscular anomaly impinging on the ulnar nerve is seen but the nerve appears mildly prominent with increased T2 signal in the cubital tunnel. There is minimal marrow edema in the overlying medial epicondyle of the humerus. Bones: No fracture or worrisome lesion. IMPRESSION: No impingement on the ulnar nerve is identified but the nerve appears mildly thickened with increased T2 signal in the cubital tunnel and there is minimal marrow edema overlying the nerve in the medial epicondyle of the humerus. Findings compatible with mild lateral epicondylitis. Electronically Signed   By: Inge Rise M.D.   On: 10/29/2018 09:32    Assessment & Plan:   There are no diagnoses linked to this encounter.   No orders of the defined types were placed in this encounter.    Follow-up: No follow-ups on file.  Walker Kehr, MD

## 2019-05-28 NOTE — Assessment & Plan Note (Signed)
Recurrent - Omnicef Claritin  CT scan

## 2019-05-29 DIAGNOSIS — M542 Cervicalgia: Secondary | ICD-10-CM | POA: Diagnosis not present

## 2019-05-29 DIAGNOSIS — M545 Low back pain: Secondary | ICD-10-CM | POA: Diagnosis not present

## 2019-05-30 DIAGNOSIS — J310 Chronic rhinitis: Secondary | ICD-10-CM | POA: Insufficient documentation

## 2019-05-30 NOTE — Assessment & Plan Note (Signed)
Take Claritin 

## 2019-06-05 DIAGNOSIS — M545 Low back pain: Secondary | ICD-10-CM | POA: Diagnosis not present

## 2019-06-05 DIAGNOSIS — M542 Cervicalgia: Secondary | ICD-10-CM | POA: Diagnosis not present

## 2019-06-07 DIAGNOSIS — Z86010 Personal history of colonic polyps: Secondary | ICD-10-CM | POA: Diagnosis not present

## 2019-06-07 DIAGNOSIS — R7303 Prediabetes: Secondary | ICD-10-CM | POA: Diagnosis not present

## 2019-06-07 DIAGNOSIS — M5412 Radiculopathy, cervical region: Secondary | ICD-10-CM | POA: Diagnosis not present

## 2019-06-07 DIAGNOSIS — J329 Chronic sinusitis, unspecified: Secondary | ICD-10-CM | POA: Diagnosis not present

## 2019-06-08 ENCOUNTER — Ambulatory Visit (INDEPENDENT_AMBULATORY_CARE_PROVIDER_SITE_OTHER): Payer: Federal, State, Local not specified - PPO | Admitting: Specialist

## 2019-06-08 ENCOUNTER — Encounter: Payer: Self-pay | Admitting: Specialist

## 2019-06-08 ENCOUNTER — Ambulatory Visit: Payer: Self-pay

## 2019-06-08 ENCOUNTER — Other Ambulatory Visit: Payer: Self-pay

## 2019-06-08 VITALS — BP 130/77 | HR 53 | Ht 70.0 in | Wt 163.0 lb

## 2019-06-08 DIAGNOSIS — M4802 Spinal stenosis, cervical region: Secondary | ICD-10-CM | POA: Diagnosis not present

## 2019-06-08 DIAGNOSIS — M4712 Other spondylosis with myelopathy, cervical region: Secondary | ICD-10-CM | POA: Diagnosis not present

## 2019-06-08 DIAGNOSIS — M5412 Radiculopathy, cervical region: Secondary | ICD-10-CM | POA: Diagnosis not present

## 2019-06-08 DIAGNOSIS — M5030 Other cervical disc degeneration, unspecified cervical region: Secondary | ICD-10-CM

## 2019-06-08 DIAGNOSIS — G5601 Carpal tunnel syndrome, right upper limb: Secondary | ICD-10-CM

## 2019-06-08 MED ORDER — GABAPENTIN 100 MG PO CAPS: 100.0000 mg | ORAL_CAPSULE | Freq: Three times a day (TID) | ORAL | 1 refills | Status: DC | PRN

## 2019-06-08 MED ORDER — NAPROXEN 500 MG PO TABS: 500.0000 mg | ORAL_TABLET | Freq: Two times a day (BID) | ORAL | 1 refills | Status: DC

## 2019-06-08 NOTE — Patient Instructions (Signed)
Plan: Avoid overhead lifting and overhead use of the arms. Do not lift greater than 5 lbs. Adjust head rest in vehicle to prevent hyperextension if rear ended. Take extra precautions to avoid falling. MRi of the cervical spine to assess for cord compression in the cervical spine or worsening of the spondylosis changes seen previously in MRI from 2016 due to persistent numbness and shoulder and Interscapular pain.
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
## 2019-06-12 ENCOUNTER — Telehealth: Payer: Self-pay | Admitting: *Deleted

## 2019-06-12 DIAGNOSIS — M542 Cervicalgia: Secondary | ICD-10-CM | POA: Diagnosis not present

## 2019-06-12 DIAGNOSIS — M545 Low back pain: Secondary | ICD-10-CM | POA: Diagnosis not present

## 2019-06-12 NOTE — Telephone Encounter (Signed)

## 2019-06-13 ENCOUNTER — Ambulatory Visit (INDEPENDENT_AMBULATORY_CARE_PROVIDER_SITE_OTHER)
Admission: RE | Admit: 2019-06-13 | Discharge: 2019-06-13 | Disposition: A | Discharge status: Home / Self Care | Payer: Federal, State, Local not specified - PPO | Source: Ambulatory Visit | Attending: Internal Medicine | Admitting: Internal Medicine

## 2019-06-13 ENCOUNTER — Other Ambulatory Visit: Payer: Self-pay

## 2019-06-13 DIAGNOSIS — J0110 Acute frontal sinusitis, unspecified: Secondary | ICD-10-CM

## 2019-06-13 DIAGNOSIS — J3489 Other specified disorders of nose and nasal sinuses: Secondary | ICD-10-CM | POA: Diagnosis not present

## 2019-06-17 ENCOUNTER — Other Ambulatory Visit: Payer: Self-pay | Admitting: Internal Medicine

## 2019-06-19 DIAGNOSIS — M542 Cervicalgia: Secondary | ICD-10-CM | POA: Diagnosis not present

## 2019-06-19 DIAGNOSIS — M545 Low back pain: Secondary | ICD-10-CM | POA: Diagnosis not present

## 2019-06-25 ENCOUNTER — Telehealth: Payer: Self-pay | Admitting: Internal Medicine

## 2019-06-25 NOTE — Telephone Encounter (Signed)
Pt was prescribed an antibiotic to treat his sinus issue and the Pt is in the middle of relocating and has misplaced the medication or may have packed it in storage and wants to know if Dr. Alain Marion can send in another Rx for it/ please advise

## 2019-06-26 MED ORDER — CEFDINIR 300 MG PO CAPS: 300.0000 mg | ORAL_CAPSULE | Freq: Two times a day (BID) | ORAL | 0 refills | Status: DC

## 2019-06-26 NOTE — Telephone Encounter (Signed)
Notified pt MD sent rx to [facility] 

## 2019-06-26 NOTE — Telephone Encounter (Signed)
Done. Thx.

## 2019-06-27 DIAGNOSIS — M542 Cervicalgia: Secondary | ICD-10-CM | POA: Diagnosis not present

## 2019-06-27 DIAGNOSIS — M545 Low back pain: Secondary | ICD-10-CM | POA: Diagnosis not present

## 2019-06-28 ENCOUNTER — Ambulatory Visit: Payer: Federal, State, Local not specified - PPO | Admitting: Specialist

## 2019-07-03 DIAGNOSIS — M545 Low back pain: Secondary | ICD-10-CM | POA: Diagnosis not present

## 2019-07-03 DIAGNOSIS — M542 Cervicalgia: Secondary | ICD-10-CM | POA: Diagnosis not present

## 2019-07-09 ENCOUNTER — Other Ambulatory Visit: Payer: Self-pay

## 2019-07-09 ENCOUNTER — Ambulatory Visit
Admission: RE | Admit: 2019-07-09 | Discharge: 2019-07-09 | Disposition: A | Discharge status: Home / Self Care | Payer: Federal, State, Local not specified - PPO | Source: Ambulatory Visit | Attending: Specialist | Admitting: Specialist

## 2019-07-09 DIAGNOSIS — M5030 Other cervical disc degeneration, unspecified cervical region: Secondary | ICD-10-CM

## 2019-07-09 DIAGNOSIS — M4802 Spinal stenosis, cervical region: Secondary | ICD-10-CM | POA: Diagnosis not present

## 2019-07-10 DIAGNOSIS — M542 Cervicalgia: Secondary | ICD-10-CM | POA: Diagnosis not present

## 2019-07-10 DIAGNOSIS — M545 Low back pain: Secondary | ICD-10-CM | POA: Diagnosis not present

## 2019-07-12 ENCOUNTER — Other Ambulatory Visit: Payer: Federal, State, Local not specified - PPO

## 2019-07-16 ENCOUNTER — Ambulatory Visit (INDEPENDENT_AMBULATORY_CARE_PROVIDER_SITE_OTHER): Payer: Federal, State, Local not specified - PPO | Admitting: Internal Medicine

## 2019-07-16 ENCOUNTER — Encounter: Payer: Self-pay | Admitting: Internal Medicine

## 2019-07-16 ENCOUNTER — Other Ambulatory Visit: Payer: Self-pay

## 2019-07-16 DIAGNOSIS — R51 Headache: Secondary | ICD-10-CM | POA: Diagnosis not present

## 2019-07-16 DIAGNOSIS — G8929 Other chronic pain: Secondary | ICD-10-CM

## 2019-07-16 DIAGNOSIS — J310 Chronic rhinitis: Secondary | ICD-10-CM | POA: Diagnosis not present

## 2019-07-16 DIAGNOSIS — E538 Deficiency of other specified B group vitamins: Secondary | ICD-10-CM

## 2019-07-16 MED ORDER — FLUTICASONE PROPIONATE 50 MCG/ACT NA SUSP: 2.0000 | Freq: Every day | NASAL | 6 refills | Status: DC

## 2019-07-16 MED ORDER — LORATADINE 10 MG PO TABS: 10.0000 mg | ORAL_TABLET | Freq: Every day | ORAL | 3 refills | Status: DC

## 2019-07-16 NOTE — Assessment & Plan Note (Signed)
CT sinuses reviewed Cefuroxime Added Flonase, Claritin

## 2019-07-16 NOTE — Progress Notes (Signed)
Subjective:  Patient ID: Jorge Turner, male    DOB: 08-17-1956  Age: 63 y.o. MRN: [id number]  CC: No chief complaint on file.   HPI Jorge Turner presents for HAs - still on abx (lost Rx in the move) C/o allergies F/u R arm pain, neck pain  Outpatient Medications Prior to Visit  Medication Sig Dispense Refill   BREO ELLIPTA 100-25 MCG/INH AEPB INHALE 1 PUFF INTO THE LUNGS EVERY DAY 1 each 11   cefdinir (OMNICEF) 300 MG capsule Take 1 capsule (300 mg total) by mouth 2 (two) times daily. 28 capsule 0   cholecalciferol (VITAMIN D) 1000 UNITS tablet Take 1,000 Units by mouth daily.       clobetasol ointment (TEMOVATE) 0.05 % APPLY TO AFFECTED AREA TWICE A DAY 30 g 1   Cyanocobalamin (VITAMIN B-12) 1000 MCG SUBL Place 1 tablet (1,000 mcg total) under the tongue daily. 100 tablet 3   diclofenac sodium (VOLTAREN) 1 % GEL APPLY 2 GRAMS TO AFFECTED AREA 4 TIMES A DAY 300 g 3   finasteride (PROSCAR) 5 MG tablet TAKE 1 TABLET BY MOUTH EVERY DAY 90 tablet 3   fluticasone (FLONASE) 50 MCG/ACT nasal spray Place 2 sprays into both nostrils daily. 16 g 6   gabapentin (NEURONTIN) 100 MG capsule Take 1 capsule (100 mg total) by mouth 3 (three) times daily as needed. 270 capsule 1   glycopyrrolate (ROBINUL) 2 MG tablet TAKE 1 TABLET (2 MG TOTAL) BY MOUTH 3 (THREE) TIMES DAILY AS NEEDED (FOR ABDOMINAL CRAMPS). 270 tablet 0   hydrocortisone (ANUSOL-HC) 2.5 % rectal cream APPLY TO AFFECTED AREA TWICE A DAY AS NEEDED 30 g 2   hydrOXYzine (ATARAX/VISTARIL) 25 MG tablet TAKE 1-2 TABLETS (25-50 MG TOTAL) BY MOUTH AT BEDTIME AS NEEDED FOR ITCHING. 180 tablet 0   meclizine (ANTIVERT) 12.5 MG tablet Take 1 tablet (12.5 mg total) by mouth 3 (three) times daily as needed for dizziness. 60 tablet 1   mometasone (ELOCON) 0.1 % lotion Apply topically daily. 60 mL 2   naproxen (NAPROSYN) 500 MG tablet Take 1 tablet (500 mg total) by mouth 2 (two) times daily with a meal. 60 tablet 1   omeprazole (PRILOSEC)  40 MG capsule TAKE 1 CAPSULE BY MOUTH EVERY DAY 90 capsule 3   Probiotic Product (ALIGN) 4 MG CAPS Take 1 capsule (4 mg total) by mouth daily. 30 capsule 5   psyllium (METAMUCIL) 0.52 g capsule Take 1 capsule (0.52 g total) by mouth 2 (two) times daily. 100 capsule 11   tamsulosin (FLOMAX) 0.4 MG CAPS capsule TAKE 1 CAPSULE BY MOUTH EVERY DAY 90 capsule 3   valACYclovir (VALTREX) 1000 MG tablet Take 2t po q12h times 2 doses. Repeat with next fever blister episode. 20 tablet 3   No facility-administered medications prior to visit.     ROS: Review of Systems  Constitutional: Negative for appetite change, fatigue and unexpected weight change.  HENT: Negative for congestion, nosebleeds, sneezing, sore throat and trouble swallowing.   Eyes: Negative for itching and visual disturbance.  Respiratory: Negative for cough.   Cardiovascular: Negative for chest pain, palpitations and leg swelling.  Gastrointestinal: Negative for abdominal distention, blood in stool, diarrhea and nausea.  Genitourinary: Negative for frequency and hematuria.  Musculoskeletal: Negative for back pain, gait problem, joint swelling and neck pain.  Skin: Negative for rash.  Neurological: Negative for dizziness, tremors, speech difficulty and weakness.  Psychiatric/Behavioral: Negative for agitation, dysphoric mood and sleep disturbance. The patient is  not nervous/anxious.     Objective:  Ht 5\' 10"  (1.778 m)    Wt 162 lb (73.5 kg)    BMI 23.24 kg/m   BP Readings from Last 3 Encounters:  06/08/19 130/77  05/28/19 116/72  04/20/19 112/71    Wt Readings from Last 3 Encounters:  07/16/19 162 lb (73.5 kg)  06/08/19 163 lb (73.9 kg)  05/28/19 163 lb (73.9 kg)    Physical Exam Constitutional:      General: He is not in acute distress.    Appearance: He is well-developed. He is not diaphoretic.     Comments: NAD  HENT:     Head: Normocephalic and atraumatic.     Right Ear: External ear normal.     Left Ear:  External ear normal.     Nose: Nose normal.     Mouth/Throat:     Pharynx: No oropharyngeal exudate.  Eyes:     General: No scleral icterus.       Right eye: No discharge.        Left eye: No discharge.     Conjunctiva/sclera: Conjunctivae normal.     Pupils: Pupils are equal, round, and reactive to light.  Neck:     Musculoskeletal: Normal range of motion and neck supple.     Thyroid: No thyromegaly.     Vascular: No JVD.     Trachea: No tracheal deviation.  Cardiovascular:     Rate and Rhythm: Normal rate and regular rhythm.     Heart sounds: Normal heart sounds. No murmur. No friction rub. No gallop.   Pulmonary:     Effort: Pulmonary effort is normal. No respiratory distress.     Breath sounds: Normal breath sounds. No stridor. No wheezing or rales.  Chest:     Chest wall: No tenderness.  Abdominal:     General: Bowel sounds are normal. There is no distension.     Palpations: Abdomen is soft. There is no mass.     Tenderness: There is no abdominal tenderness. There is no guarding or rebound.  Genitourinary:    Penis: Normal. No tenderness.      Prostate: Normal.     Rectum: Normal. Guaiac result negative.  Musculoskeletal: Normal range of motion.        General: No tenderness.  Lymphadenopathy:     Cervical: No cervical adenopathy.  Skin:    General: Skin is warm and dry.     Coloration: Skin is not pale.     Findings: No erythema or rash.  Neurological:     Mental Status: He is alert and oriented to person, place, and time.     Cranial Nerves: No cranial nerve deficit.     Motor: No abnormal muscle tone.     Coordination: Coordination normal.     Gait: Gait normal.     Deep Tendon Reflexes: Reflexes are normal and symmetric. Reflexes normal.  Psychiatric:        Behavior: Behavior normal.        Thought Content: Thought content normal.        Judgment: Judgment normal.     Lab Results  Component Value Date   WBC 3.3 (L) 10/03/2018   HGB 14.9 10/03/2018    HCT 43.8 10/03/2018   PLT 267.0 10/03/2018   GLUCOSE 110 (H) 10/03/2018   CHOL 187 02/08/2018   TRIG 92.0 02/08/2018   HDL 55.50 02/08/2018   LDLDIRECT 140.3 10/26/2011   LDLCALC 113 (H) 02/08/2018  ALT 14 10/03/2018   AST 13 10/03/2018   NA 139 10/03/2018   K 4.5 10/03/2018   CL 104 10/03/2018   CREATININE 1.44 10/03/2018   BUN 27 (H) 10/03/2018   CO2 29 10/03/2018   TSH 3.94 02/08/2018   PSA 4.13 (H) 02/08/2018    Mr Cervical Spine W/o Contrast  Result Date: 07/09/2019 CLINICAL DATA:  62 year old male with neck pain and increasing right shoulder pain. Numbness in the right arm hand and wrist. EXAM: MRI CERVICAL SPINE WITHOUT CONTRAST TECHNIQUE: Multiplanar, multisequence MR imaging of the cervical spine was performed. No intravenous contrast was administered. COMPARISON:  Cervical spine MRI 05/30/2015. Cervical spine radiographs 03/07/2017. FINDINGS: Alignment: Mild straightening of cervical lordosis compared to 2016. No spondylolisthesis. Vertebrae: No marrow edema or evidence of acute osseous abnormality. Visualized bone marrow signal is within normal limits. Cord: Spinal cord signal is within normal limits at all visualized levels. Posterior Fossa, vertebral arteries, paraspinal tissues: Cervicomedullary junction is within normal limits. Negative visible posterior fossa. Preserved major vascular flow voids, the left vertebral artery again appears dominant. Negative visible neck soft tissues and left lung apex. Disc levels: C2-C3:  Mild endplate spurring.  No stenosis. C3-C4: Minor disc bulge and endplate spurring. Stable borderline to mild right C4 foraminal stenosis. C4-C5: Mild circumferential disc bulge and endplate spurring. Mild facet hypertrophy. Ligament flavum hypertrophy has regressed since 2016, but still there is mild spinal stenosis. No cord mass effect. Moderate to severe bilateral C5 foraminal stenosis appears increased. C5-C6: Chronic disc space loss and circumferential  disc osteophyte complex. Stable mild facet and ligament flavum hypertrophy. Mild spinal stenosis. Mild if any spinal cord mass effect. Moderate to severe left and moderate right C6 foraminal stenosis. This level is stable. C6-C7: Mild posterior element hypertrophy. Borderline to mild bilateral C7 foraminal stenosis is stable. C7-T1:  Mild posterior element hypertrophy.  No stenosis. No visible upper thoracic spinal stenosis. IMPRESSION: 1. No acute osseous abnormality and largely stable MRI appearance of the cervical spine since 2016. 2. Chronic mild spinal stenosis at C4-C5 and C5-C6. Mild if any spinal cord mass effect and no cord signal abnormality. Moderate to severe bilateral C5 neural foraminal stenosis does appear increased since 2016. Moderate to severe left greater than right C6 foraminal stenosis appears stable. Electronically Signed   By: Genevie Ann M.D.   On: 07/09/2019 21:09    Assessment & Plan:   There are no diagnoses linked to this encounter.   No orders of the defined types were placed in this encounter.    Follow-up: No follow-ups on file.  Walker Kehr, MD

## 2019-07-16 NOTE — Assessment & Plan Note (Signed)
Take Claritin, Flonase

## 2019-07-16 NOTE — Assessment & Plan Note (Signed)
On B12 

## 2019-07-17 DIAGNOSIS — M542 Cervicalgia: Secondary | ICD-10-CM | POA: Diagnosis not present

## 2019-07-17 DIAGNOSIS — M545 Low back pain: Secondary | ICD-10-CM | POA: Diagnosis not present

## 2019-07-20 ENCOUNTER — Ambulatory Visit: Payer: Self-pay

## 2019-07-20 ENCOUNTER — Encounter: Payer: Self-pay | Admitting: Specialist

## 2019-07-20 ENCOUNTER — Ambulatory Visit (INDEPENDENT_AMBULATORY_CARE_PROVIDER_SITE_OTHER): Payer: Federal, State, Local not specified - PPO | Admitting: Specialist

## 2019-07-20 VITALS — BP 114/73 | HR 49 | Ht 70.0 in | Wt 165.0 lb

## 2019-07-20 DIAGNOSIS — Z9889 Other specified postprocedural states: Secondary | ICD-10-CM

## 2019-07-20 DIAGNOSIS — M4722 Other spondylosis with radiculopathy, cervical region: Secondary | ICD-10-CM | POA: Diagnosis not present

## 2019-07-20 DIAGNOSIS — R200 Anesthesia of skin: Secondary | ICD-10-CM

## 2019-07-20 DIAGNOSIS — M5030 Other cervical disc degeneration, unspecified cervical region: Secondary | ICD-10-CM

## 2019-07-20 DIAGNOSIS — R202 Paresthesia of skin: Secondary | ICD-10-CM
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
## 2019-07-24 DIAGNOSIS — M545 Low back pain: Secondary | ICD-10-CM | POA: Diagnosis not present

## 2019-07-24 DIAGNOSIS — M542 Cervicalgia: Secondary | ICD-10-CM | POA: Diagnosis not present

## 2019-07-31 DIAGNOSIS — M545 Low back pain: Secondary | ICD-10-CM | POA: Diagnosis not present

## 2019-07-31 DIAGNOSIS — M542 Cervicalgia: Secondary | ICD-10-CM | POA: Diagnosis not present

## 2019-08-14 DIAGNOSIS — M545 Low back pain: Secondary | ICD-10-CM | POA: Diagnosis not present

## 2019-08-14 DIAGNOSIS — M542 Cervicalgia: Secondary | ICD-10-CM | POA: Diagnosis not present

## 2019-08-17 ENCOUNTER — Telehealth: Payer: Self-pay | Admitting: Radiology

## 2019-08-17 ENCOUNTER — Ambulatory Visit: Payer: Federal, State, Local not specified - PPO | Admitting: Specialist

## 2019-08-17 NOTE — Telephone Encounter (Signed)
Patient called asking about his [place] of his right wrist.  He thought one was to be ordered and no one has called him.  It appears there is an order on his last visit, nothing went to referrals, not sure what needs to be done with this one?  Do we need to place an order?  It has been 1 month since he was seen last.  Let me know what I can do to help. I told him one of [place] would call him back to discuss.

## 2019-08-20 ENCOUNTER — Ambulatory Visit (INDEPENDENT_AMBULATORY_CARE_PROVIDER_SITE_OTHER): Payer: Federal, State, Local not specified - PPO | Admitting: Specialist

## 2019-08-20 ENCOUNTER — Ambulatory Visit: Payer: Self-pay

## 2019-08-20 ENCOUNTER — Encounter: Payer: Self-pay | Admitting: Specialist

## 2019-08-20 VITALS — BP 91/57 | HR 60 | Temp 98.1°F | Ht 70.0 in | Wt 165.0 lb

## 2019-08-20 DIAGNOSIS — R200 Anesthesia of skin: Secondary | ICD-10-CM

## 2019-08-20 DIAGNOSIS — M7701 Medial epicondylitis, right elbow: Secondary | ICD-10-CM | POA: Diagnosis not present

## 2019-08-20 DIAGNOSIS — M19031 Primary osteoarthritis, right wrist: Secondary | ICD-10-CM

## 2019-08-20 DIAGNOSIS — M47812 Spondylosis without myelopathy or radiculopathy, cervical region: Secondary | ICD-10-CM

## 2019-08-20 DIAGNOSIS — M7702 Medial epicondylitis, left elbow: Secondary | ICD-10-CM

## 2019-08-20 DIAGNOSIS — R202 Paresthesia of skin: Secondary | ICD-10-CM

## 2019-08-20 DIAGNOSIS — Z9889 Other specified postprocedural states: Secondary | ICD-10-CM

## 2019-08-20 NOTE — Telephone Encounter (Signed)
Looks like we need to get in scheduled to be done.  Not sure why it didn't get sent to be scheduled.

## 2019-08-20 NOTE — Patient Instructions (Signed)
Ultrasound of the right wrist to assess for persistent nerve compression. Use elbow sleeves to support elbows at work. PT order at intergrative PT for iontophoresis with RX for dexmethasone 4mg /ml  #5CC use with iontophoresis.  Naprosyn for arthritis Work of strengthening and ROM of the wrists and hands and elbows.
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
## 2019-08-21 ENCOUNTER — Other Ambulatory Visit: Payer: Self-pay | Admitting: Specialist

## 2019-08-21 DIAGNOSIS — M25511 Pain in right shoulder: Secondary | ICD-10-CM | POA: Diagnosis not present

## 2019-08-21 DIAGNOSIS — M542 Cervicalgia: Secondary | ICD-10-CM | POA: Diagnosis not present

## 2019-08-21 DIAGNOSIS — M545 Low back pain: Secondary | ICD-10-CM | POA: Diagnosis not present

## 2019-08-21 DIAGNOSIS — M19031 Primary osteoarthritis, right wrist: Secondary | ICD-10-CM

## 2019-08-21 NOTE — Telephone Encounter (Signed)
I re entered order today, not sure why the orders he was entering were not routing correctly.  Will you please call patient and tell him he can go ahead and call [facility] now to schedule? Instead of him waiting for them since it has been delayed already. Thanks-

## 2019-08-21 NOTE — Telephone Encounter (Signed)
I called and advised patient.

## 2019-08-24 ENCOUNTER — Ambulatory Visit
Admission: RE | Admit: 2019-08-24 | Discharge: 2019-08-24 | Disposition: A | Discharge status: Home / Self Care | Payer: Federal, State, Local not specified - PPO | Source: Ambulatory Visit | Attending: Specialist | Admitting: Specialist

## 2019-08-24 DIAGNOSIS — M19031 Primary osteoarthritis, right wrist: Secondary | ICD-10-CM

## 2019-08-24 DIAGNOSIS — G5611 Other lesions of median nerve, right upper limb: Secondary | ICD-10-CM | POA: Diagnosis not present

## 2019-08-28 DIAGNOSIS — M545 Low back pain: Secondary | ICD-10-CM | POA: Diagnosis not present

## 2019-08-28 DIAGNOSIS — M542 Cervicalgia: Secondary | ICD-10-CM | POA: Diagnosis not present

## 2019-09-04 DIAGNOSIS — M545 Low back pain: Secondary | ICD-10-CM | POA: Diagnosis not present

## 2019-09-04 DIAGNOSIS — M542 Cervicalgia: Secondary | ICD-10-CM | POA: Diagnosis not present

## 2019-09-11 DIAGNOSIS — M545 Low back pain: Secondary | ICD-10-CM | POA: Diagnosis not present

## 2019-09-11 DIAGNOSIS — M542 Cervicalgia: Secondary | ICD-10-CM | POA: Diagnosis not present

## 2019-09-15 ENCOUNTER — Other Ambulatory Visit: Payer: Self-pay | Admitting: Internal Medicine

## 2019-09-17 ENCOUNTER — Ambulatory Visit: Payer: Federal, State, Local not specified - PPO | Admitting: Specialist

## 2019-09-18 ENCOUNTER — Ambulatory Visit: Payer: Federal, State, Local not specified - PPO | Admitting: Specialist

## 2019-09-18 DIAGNOSIS — M542 Cervicalgia: Secondary | ICD-10-CM | POA: Diagnosis not present

## 2019-09-18 DIAGNOSIS — M545 Low back pain: Secondary | ICD-10-CM | POA: Diagnosis not present

## 2019-09-23 ENCOUNTER — Other Ambulatory Visit: Payer: Self-pay | Admitting: Specialist

## 2019-09-23 NOTE — Telephone Encounter (Signed)
Ok to rf? 

## 2019-09-26 NOTE — Telephone Encounter (Signed)
Okay to refill the naproxen.  0 refills on that.  Do not refill the Voltaren gel.  Advised patient if he has any GI upset whatsoever with the naproxen he needs to discontinue.  Needs ROV with Dr. Louanne Skye after he has his study completed that was ordered last office visit.

## 2019-10-02 DIAGNOSIS — M542 Cervicalgia: Secondary | ICD-10-CM | POA: Diagnosis not present

## 2019-10-02 DIAGNOSIS — M545 Low back pain: Secondary | ICD-10-CM | POA: Diagnosis not present

## 2019-10-09 DIAGNOSIS — M545 Low back pain: Secondary | ICD-10-CM | POA: Diagnosis not present

## 2019-10-09 DIAGNOSIS — M542 Cervicalgia: Secondary | ICD-10-CM | POA: Diagnosis not present

## 2019-10-12 ENCOUNTER — Encounter: Payer: Self-pay | Admitting: Specialist

## 2019-10-12 ENCOUNTER — Other Ambulatory Visit: Payer: Self-pay

## 2019-10-12 ENCOUNTER — Ambulatory Visit: Payer: Self-pay

## 2019-10-12 ENCOUNTER — Ambulatory Visit: Payer: Federal, State, Local not specified - PPO | Admitting: Specialist

## 2019-10-12 VITALS — BP 108/67 | HR 60 | Ht 70.0 in | Wt 165.0 lb

## 2019-10-12 DIAGNOSIS — M542 Cervicalgia: Secondary | ICD-10-CM

## 2019-10-12 DIAGNOSIS — M79642 Pain in left hand: Secondary | ICD-10-CM

## 2019-10-12 DIAGNOSIS — G8929 Other chronic pain: Secondary | ICD-10-CM

## 2019-10-12 DIAGNOSIS — M25512 Pain in left shoulder: Secondary | ICD-10-CM | POA: Diagnosis not present

## 2019-10-12 DIAGNOSIS — R29898 Other symptoms and signs involving the musculoskeletal system: Secondary | ICD-10-CM

## 2019-10-12 DIAGNOSIS — M25511 Pain in right shoulder: Secondary | ICD-10-CM | POA: Diagnosis not present

## 2019-10-12 DIAGNOSIS — M4802 Spinal stenosis, cervical region: Secondary | ICD-10-CM

## 2019-10-12 DIAGNOSIS — M4722 Other spondylosis with radiculopathy, cervical region: Secondary | ICD-10-CM

## 2019-10-12 DIAGNOSIS — M79641 Pain in right hand: Secondary | ICD-10-CM | POA: Diagnosis not present
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
## 2019-10-12 NOTE — Patient Instructions (Signed)
Plan: Avoid overhead lifting and overhead use of the arms. Pillows to keep from sleeping directly on the shoulders Limited lifting to less than 10 lbs. Ice or heat for relief. Unable to take NSAIDs are helpful, such as alleve or motrin, be careful not to use in excess as they place burdens on the kidney. Stretching exercise help and strengthening is helpful to build endurance. MRI right shoulder to assess for intrinsic cause of right shoulder weakness, EMG/NCV doesn't explain the cause of the shoulder weakness . The muscles are innervated by upper neck level found to be normal on the EMG.  Referral to Dr. Ernestina Patches for left arm and shoulder EMG/NCV to assess for left cervical radiculopathy due to bilateral shoulder weakness and diffuse increasing proximal arm pain and scapular pain.

## 2019-10-16 DIAGNOSIS — M542 Cervicalgia: Secondary | ICD-10-CM | POA: Diagnosis not present

## 2019-10-16 DIAGNOSIS — M545 Low back pain: Secondary | ICD-10-CM | POA: Diagnosis not present

## 2019-10-22 DIAGNOSIS — Z23 Encounter for immunization: Secondary | ICD-10-CM | POA: Diagnosis not present

## 2019-10-23 DIAGNOSIS — M542 Cervicalgia: Secondary | ICD-10-CM | POA: Diagnosis not present

## 2019-10-23 DIAGNOSIS — M545 Low back pain: Secondary | ICD-10-CM | POA: Diagnosis not present

## 2019-10-24 ENCOUNTER — Ambulatory Visit: Payer: Federal, State, Local not specified - PPO

## 2019-10-30 DIAGNOSIS — M542 Cervicalgia: Secondary | ICD-10-CM | POA: Diagnosis not present

## 2019-10-30 DIAGNOSIS — M545 Low back pain: Secondary | ICD-10-CM | POA: Diagnosis not present

## 2019-11-06 ENCOUNTER — Encounter: Payer: Self-pay | Admitting: Internal Medicine

## 2019-11-06 ENCOUNTER — Other Ambulatory Visit: Payer: Self-pay

## 2019-11-06 ENCOUNTER — Ambulatory Visit (INDEPENDENT_AMBULATORY_CARE_PROVIDER_SITE_OTHER): Payer: Federal, State, Local not specified - PPO | Admitting: Internal Medicine

## 2019-11-06 DIAGNOSIS — R519 Headache, unspecified: Secondary | ICD-10-CM

## 2019-11-06 DIAGNOSIS — G4452 New daily persistent headache (NDPH): Secondary | ICD-10-CM | POA: Diagnosis not present

## 2019-11-06 DIAGNOSIS — M542 Cervicalgia: Secondary | ICD-10-CM | POA: Diagnosis not present

## 2019-11-06 DIAGNOSIS — M545 Low back pain: Secondary | ICD-10-CM | POA: Diagnosis not present

## 2019-11-06 DIAGNOSIS — G8929 Other chronic pain: Secondary | ICD-10-CM

## 2019-11-06 DIAGNOSIS — Z8249 Family history of ischemic heart disease and other diseases of the circulatory system: Secondary | ICD-10-CM | POA: Diagnosis not present

## 2019-11-06 DIAGNOSIS — E538 Deficiency of other specified B group vitamins: Secondary | ICD-10-CM

## 2019-11-06 MED ORDER — RIZATRIPTAN BENZOATE 10 MG PO TABS: 10.0000 mg | ORAL_TABLET | Freq: Once | ORAL | 12 refills | Status: DC | PRN

## 2019-11-06 NOTE — Progress Notes (Signed)
Subjective:  Patient ID: Jorge Turner, male    DOB: Jun 19, 1956  Age: 63 y.o. MRN: [id number]  CC: No chief complaint on file.   HPI TRAVIEN MURZYN presents for frequent headaches x 2-3 weeks, 3 d a week - frontal. C/o tight muscles in the neck - in PT. HAs may start in am, Ibuprofen 8/10 at times  Outpatient Medications Prior to Visit  Medication Sig Dispense Refill  . BREO ELLIPTA 100-25 MCG/INH AEPB INHALE 1 PUFF INTO THE LUNGS EVERY DAY 1 each 11  . cefdinir (OMNICEF) 300 MG capsule Take 1 capsule (300 mg total) by mouth 2 (two) times daily. 28 capsule 0  . cholecalciferol (VITAMIN D) 1000 UNITS tablet Take 1,000 Units by mouth daily.      . clobetasol ointment (TEMOVATE) 0.05 % APPLY TO AFFECTED AREA TWICE A DAY 30 g 1  . Cyanocobalamin (VITAMIN B-12) 1000 MCG SUBL Place 1 tablet (1,000 mcg total) under the tongue daily. 100 tablet 3  . diclofenac sodium (VOLTAREN) 1 % GEL APPLY 2 GRAMS TO AFFECTED AREA 4 TIMES A DAY 300 g 3  . finasteride (PROSCAR) 5 MG tablet TAKE 1 TABLET BY MOUTH EVERY DAY 90 tablet 3  . fluticasone (FLONASE) 50 MCG/ACT nasal spray Place 2 sprays into both nostrils daily. 16 g 6  . fluticasone (FLONASE) 50 MCG/ACT nasal spray Place 2 sprays into both nostrils daily. 16 g 6  . gabapentin (NEURONTIN) 100 MG capsule Take 1 capsule (100 mg total) by mouth 3 (three) times daily as needed. 270 capsule 1  . glycopyrrolate (ROBINUL) 2 MG tablet TAKE 1 TABLET (2 MG TOTAL) BY MOUTH 3 (THREE) TIMES DAILY AS NEEDED (FOR ABDOMINAL CRAMPS). 270 tablet 0  . hydrocortisone (ANUSOL-HC) 2.5 % rectal cream APPLY TO AFFECTED AREA TWICE A DAY AS NEEDED 30 g 2  . hydrOXYzine (ATARAX/VISTARIL) 25 MG tablet TAKE 1-2 TABLETS (25-50 MG TOTAL) BY MOUTH AT BEDTIME AS NEEDED FOR ITCHING. 180 tablet 0  . loratadine (CLARITIN) 10 MG tablet Take 1 tablet (10 mg total) by mouth daily. 90 tablet 3  . mometasone (ELOCON) 0.1 % lotion Apply topically daily. 60 mL 2  . naproxen (NAPROSYN) 500 MG tablet  TAKE 1 TABLET (500 MG TOTAL) BY MOUTH 2 (TWO) TIMES DAILY WITH A MEAL. 60 tablet 1  . omeprazole (PRILOSEC) 40 MG capsule TAKE 1 CAPSULE BY MOUTH EVERY DAY 90 capsule 3  . Probiotic Product (ALIGN) 4 MG CAPS Take 1 capsule (4 mg total) by mouth daily. 30 capsule 5  . psyllium (METAMUCIL) 0.52 g capsule Take 1 capsule (0.52 g total) by mouth 2 (two) times daily. 100 capsule 11  . tamsulosin (FLOMAX) 0.4 MG CAPS capsule TAKE 1 CAPSULE BY MOUTH EVERY DAY 90 capsule 3  . valACYclovir (VALTREX) 1000 MG tablet Take 2t po q12h times 2 doses. Repeat with next fever blister episode. 20 tablet 3   No facility-administered medications prior to visit.     ROS: Review of Systems  Constitutional: Negative for appetite change, fatigue and unexpected weight change.  HENT: Negative for congestion, nosebleeds, sneezing, sore throat and trouble swallowing.   Eyes: Negative for itching and visual disturbance.  Respiratory: Negative for cough.   Cardiovascular: Negative for chest pain, palpitations and leg swelling.  Gastrointestinal: Negative for abdominal distention, blood in stool, diarrhea and nausea.  Genitourinary: Negative for frequency and hematuria.  Musculoskeletal: Positive for arthralgias and neck stiffness. Negative for back pain, gait problem, joint swelling and neck pain.  Skin: Negative for rash.  Neurological: Positive for headaches. Negative for dizziness, tremors, speech difficulty and weakness.  Psychiatric/Behavioral: Negative for agitation, dysphoric mood, sleep disturbance and suicidal ideas. The patient is not nervous/anxious.     Objective:  BP 104/72 (BP Location: Left Arm, Patient Position: Sitting, Cuff Size: Normal)   Pulse (!) 56   Temp 98.4 F (36.9 C) (Oral)   Ht 5\' 10"  (1.778 m)   Wt 161 lb (73 kg)   SpO2 96%   BMI 23.10 kg/m   BP Readings from Last 3 Encounters:  11/06/19 104/72  10/12/19 108/67  08/20/19 (!) 91/57    Wt Readings from Last 3 Encounters:   11/06/19 161 lb (73 kg)  10/12/19 165 lb (74.8 kg)  08/20/19 165 lb (74.8 kg)    Physical Exam Constitutional:      General: He is not in acute distress.    Appearance: He is well-developed.     Comments: NAD  Eyes:     Conjunctiva/sclera: Conjunctivae normal.     Pupils: Pupils are equal, round, and reactive to light.  Neck:     Musculoskeletal: Normal range of motion.     Thyroid: No thyromegaly.     Vascular: No JVD.  Cardiovascular:     Rate and Rhythm: Normal rate and regular rhythm.     Heart sounds: Normal heart sounds. No murmur. No friction rub. No gallop.   Pulmonary:     Effort: Pulmonary effort is normal. No respiratory distress.     Breath sounds: Normal breath sounds. No wheezing or rales.  Chest:     Chest wall: No tenderness.  Abdominal:     General: Bowel sounds are normal. There is no distension.     Palpations: Abdomen is soft. There is no mass.     Tenderness: There is no abdominal tenderness. There is no guarding or rebound.  Musculoskeletal: Normal range of motion.        General: No tenderness.  Lymphadenopathy:     Cervical: No cervical adenopathy.  Skin:    General: Skin is warm and dry.     Findings: No rash.  Neurological:     Mental Status: He is alert and oriented to person, place, and time.     Cranial Nerves: No cranial nerve deficit.     Motor: No abnormal muscle tone.     Coordination: Coordination normal.     Gait: Gait normal.     Deep Tendon Reflexes: Reflexes are normal and symmetric.  Psychiatric:        Behavior: Behavior normal.        Thought Content: Thought content normal.        Judgment: Judgment normal.     Lab Results  Component Value Date   WBC 3.3 (L) 10/03/2018   HGB 14.9 10/03/2018   HCT 43.8 10/03/2018   PLT 267.0 10/03/2018   GLUCOSE 110 (H) 10/03/2018   CHOL 187 02/08/2018   TRIG 92.0 02/08/2018   HDL 55.50 02/08/2018   LDLDIRECT 140.3 10/26/2011   LDLCALC 113 (H) 02/08/2018   ALT 14 10/03/2018   AST  13 10/03/2018   NA 139 10/03/2018   K 4.5 10/03/2018   CL 104 10/03/2018   CREATININE 1.44 10/03/2018   BUN 27 (H) 10/03/2018   CO2 29 10/03/2018   TSH 3.94 02/08/2018   PSA 4.13 (H) 02/08/2018    [place] Complete Joint Space Structures Up Right  Result Date: 08/24/2019 CLINICAL DATA:  Persistent right median  nerve neuropathy symptoms despite carpal tunnel release in May of 2019. Evaluate for nerve compression. EXAM: ULTRASOUND RIGHT UPPER EXTREMITY COMPLETE TECHNIQUE: Ultrasound examination was performed including evaluation of the muscles, tendons, joint, and adjacent soft tissues. COMPARISON:  MRI right wrist dated October 28, 2018. FINDINGS: Focused ultrasound of the right volar wrist demonstrates a normal appearance of the median nerve. There is no evidence of nerve compression. Scar related to prior carpal tunnel release noted. IMPRESSION: Normal appearance of the median nerve status post carpal tunnel release. No nerve compression. Electronically Signed   By: Titus Dubin M.D.   On: 08/24/2019 12:00    Assessment & Plan:   There are no diagnoses linked to this encounter.   No orders of the defined types were placed in this encounter.    Follow-up: No follow-ups on file.  Walker Kehr, MD

## 2019-11-06 NOTE — Assessment & Plan Note (Signed)
Discussed.

## 2019-11-06 NOTE — Assessment & Plan Note (Addendum)
Discussed - ?migraines vs other Rice sock Claritin daily CT hed Maxalt po

## 2019-11-06 NOTE — Patient Instructions (Signed)
Rice sock Claritin daily

## 2019-11-06 NOTE — Assessment & Plan Note (Signed)
On B12 

## 2019-11-07 ENCOUNTER — Other Ambulatory Visit: Payer: Self-pay | Admitting: Radiology

## 2019-11-07 ENCOUNTER — Telehealth: Payer: Self-pay | Admitting: Radiology

## 2019-11-07 ENCOUNTER — Ambulatory Visit (INDEPENDENT_AMBULATORY_CARE_PROVIDER_SITE_OTHER)
Admission: RE | Admit: 2019-11-07 | Discharge: 2019-11-07 | Disposition: A | Discharge status: Home / Self Care | Payer: Federal, State, Local not specified - PPO | Source: Ambulatory Visit | Attending: Internal Medicine | Admitting: Internal Medicine

## 2019-11-07 DIAGNOSIS — M542 Cervicalgia: Secondary | ICD-10-CM

## 2019-11-07 DIAGNOSIS — M25511 Pain in right shoulder: Secondary | ICD-10-CM

## 2019-11-07 DIAGNOSIS — R29898 Other symptoms and signs involving the musculoskeletal system: Secondary | ICD-10-CM

## 2019-11-07 DIAGNOSIS — G4452 New daily persistent headache (NDPH): Secondary | ICD-10-CM | POA: Diagnosis not present

## 2019-11-07 DIAGNOSIS — G8929 Other chronic pain: Secondary | ICD-10-CM

## 2019-11-07 DIAGNOSIS — R519 Headache, unspecified: Secondary | ICD-10-CM | POA: Diagnosis not present

## 2019-11-07 DIAGNOSIS — M79642 Pain in left hand: Secondary | ICD-10-CM

## 2019-11-07 DIAGNOSIS — M79641 Pain in right hand: Secondary | ICD-10-CM

## 2019-11-07 NOTE — Telephone Encounter (Signed)
Patient called and lmovm, states that he was supposed to EMG/NCS done by Dr. Ernestina Patches.  I looked in chart no order was placed but according to OV note Dr. Louanne Skye was going to order it.  I put the order in the chart and sent to [facility] to schedule.

## 2019-11-13 DIAGNOSIS — M545 Low back pain: Secondary | ICD-10-CM | POA: Diagnosis not present

## 2019-11-13 DIAGNOSIS — M542 Cervicalgia: Secondary | ICD-10-CM | POA: Diagnosis not present

## 2019-11-16 ENCOUNTER — Ambulatory Visit: Payer: Federal, State, Local not specified - PPO | Admitting: Specialist

## 2019-11-19 ENCOUNTER — Ambulatory Visit: Payer: Federal, State, Local not specified - PPO | Admitting: Internal Medicine

## 2019-11-20 DIAGNOSIS — M545 Low back pain: Secondary | ICD-10-CM | POA: Diagnosis not present

## 2019-11-20 DIAGNOSIS — M542 Cervicalgia: Secondary | ICD-10-CM | POA: Diagnosis not present

## 2019-11-25 ENCOUNTER — Other Ambulatory Visit: Payer: Self-pay | Admitting: Internal Medicine

## 2019-11-26 ENCOUNTER — Ambulatory Visit (INDEPENDENT_AMBULATORY_CARE_PROVIDER_SITE_OTHER): Payer: Federal, State, Local not specified - PPO | Admitting: Internal Medicine

## 2019-11-26 ENCOUNTER — Other Ambulatory Visit: Payer: Self-pay

## 2019-11-26 ENCOUNTER — Encounter: Payer: Self-pay | Admitting: Internal Medicine

## 2019-11-26 DIAGNOSIS — G8929 Other chronic pain: Secondary | ICD-10-CM

## 2019-11-26 DIAGNOSIS — R519 Headache, unspecified: Secondary | ICD-10-CM | POA: Diagnosis not present

## 2019-11-26 DIAGNOSIS — E538 Deficiency of other specified B group vitamins: Secondary | ICD-10-CM

## 2019-11-26 DIAGNOSIS — K219 Gastro-esophageal reflux disease without esophagitis: Secondary | ICD-10-CM

## 2019-11-26 LAB — BASIC METABOLIC PANEL
BUN: 23 mg/dL (ref 6–23)
CO2: 28 mEq/L (ref 19–32)
Calcium: 9.9 mg/dL (ref 8.4–10.5)
Chloride: 104 mEq/L (ref 96–112)
Creatinine, Ser: 1.55 mg/dL — ABNORMAL HIGH (ref 0.40–1.50)
GFR: 55.1 mL/min — ABNORMAL LOW (ref >60.00)
Glucose, Bld: 94 mg/dL (ref 70–99)
Potassium: 3.7 mEq/L (ref 3.5–5.1)
Sodium: 139 mEq/L (ref 135–145)

## 2019-11-26 LAB — CBC WITH DIFFERENTIAL/PLATELET
Basophils Absolute: 0 10*3/uL (ref 0.0–0.1)
Basophils Relative: 1.2 % (ref 0.0–3.0)
Eosinophils Absolute: 0.3 10*3/uL (ref 0.0–0.7)
Eosinophils Relative: 6.4 % — ABNORMAL HIGH (ref 0.0–5.0)
HCT: 46.3 % (ref 39.0–52.0)
Hemoglobin: 15.4 g/dL (ref 13.0–17.0)
Lymphocytes Relative: 24.6 % (ref 12.0–46.0)
Lymphs Abs: 1 10*3/uL (ref 0.7–4.0)
MCHC: 33.1 g/dL (ref 30.0–36.0)
MCV: 91 fl (ref 78.0–100.0)
Monocytes Absolute: 0.5 10*3/uL (ref 0.1–1.0)
Monocytes Relative: 12.3 % — ABNORMAL HIGH (ref 3.0–12.0)
Neutro Abs: 2.2 10*3/uL (ref 1.4–7.7)
Neutrophils Relative %: 55.5 % (ref 43.0–77.0)
Platelets: 265 10*3/uL (ref 150.0–400.0)
RBC: 5.09 Mil/uL (ref 4.22–5.81)
RDW: 14.1 % (ref 11.5–15.5)
WBC: 3.9 10*3/uL — ABNORMAL LOW (ref 4.0–10.5)

## 2019-11-26 LAB — SEDIMENTATION RATE: Sed Rate: 2 mm/hr (ref 0–20)

## 2019-11-26 MED ORDER — OMEPRAZOLE 40 MG PO CPDR: 40.0000 mg | DELAYED_RELEASE_CAPSULE | Freq: Two times a day (BID) | ORAL | 3 refills | Status: DC

## 2019-11-26 MED ORDER — DUEXIS 800-26.6 MG PO TABS: ORAL_TABLET | ORAL | 2 refills | Status: DC

## 2019-11-26 NOTE — Addendum Note (Signed)
Addended by: Leeanne Rio on: 11/26/2019 03:57 PM   Modules accepted: Orders

## 2019-11-26 NOTE — Assessment & Plan Note (Signed)
Worse Prilosec - increase from 40 mg qd to 20 mg bid

## 2019-11-26 NOTE — Progress Notes (Signed)
Subjective:  Patient ID: Jorge Turner, male    DOB: 1956-07-09  Age: 63 y.o. MRN: [id number]  CC: No chief complaint on file.   HPI Jorge Turner presents for recurrent HAs - not better; inconsistent, not every day... C/o GERD - not better Head CT is ok  Outpatient Medications Prior to Visit  Medication Sig Dispense Refill  . BREO ELLIPTA 100-25 MCG/INH AEPB INHALE 1 PUFF INTO THE LUNGS EVERY DAY 1 each 11  . cefdinir (OMNICEF) 300 MG capsule Take 1 capsule (300 mg total) by mouth 2 (two) times daily. 28 capsule 0  . cholecalciferol (VITAMIN D) 1000 UNITS tablet Take 1,000 Units by mouth daily.      . clobetasol ointment (TEMOVATE) 0.05 % APPLY TO AFFECTED AREA TWICE A DAY 30 g 1  . Cyanocobalamin (VITAMIN B-12) 1000 MCG SUBL Place 1 tablet (1,000 mcg total) under the tongue daily. 100 tablet 3  . diclofenac sodium (VOLTAREN) 1 % GEL APPLY 2 GRAMS TO AFFECTED AREA 4 TIMES A DAY 300 g 3  . finasteride (PROSCAR) 5 MG tablet TAKE 1 TABLET BY MOUTH EVERY DAY 90 tablet 3  . fluticasone (FLONASE) 50 MCG/ACT nasal spray Place 2 sprays into both nostrils daily. 16 g 6  . fluticasone (FLONASE) 50 MCG/ACT nasal spray Place 2 sprays into both nostrils daily. 16 g 6  . gabapentin (NEURONTIN) 100 MG capsule Take 1 capsule (100 mg total) by mouth 3 (three) times daily as needed. 270 capsule 1  . glycopyrrolate (ROBINUL) 2 MG tablet TAKE 1 TABLET (2 MG TOTAL) BY MOUTH 3 (THREE) TIMES DAILY AS NEEDED (FOR ABDOMINAL CRAMPS). 270 tablet 0  . hydrocortisone (ANUSOL-HC) 2.5 % rectal cream APPLY TO AFFECTED AREA TWICE A DAY AS NEEDED 30 g 2  . hydrOXYzine (ATARAX/VISTARIL) 25 MG tablet TAKE 1-2 TABLETS (25-50 MG TOTAL) BY MOUTH AT BEDTIME AS NEEDED FOR ITCHING. 180 tablet 0  . loratadine (CLARITIN) 10 MG tablet Take 1 tablet (10 mg total) by mouth daily. 90 tablet 3  . mometasone (ELOCON) 0.1 % lotion Apply topically daily. 60 mL 2  . naproxen (NAPROSYN) 500 MG tablet TAKE 1 TABLET (500 MG TOTAL) BY MOUTH 2  (TWO) TIMES DAILY WITH A MEAL. 60 tablet 1  . omeprazole (PRILOSEC) 40 MG capsule TAKE 1 CAPSULE BY MOUTH EVERY DAY 90 capsule 3  . Probiotic Product (ALIGN) 4 MG CAPS Take 1 capsule (4 mg total) by mouth daily. 30 capsule 5  . psyllium (METAMUCIL) 0.52 g capsule Take 1 capsule (0.52 g total) by mouth 2 (two) times daily. 100 capsule 11  . rizatriptan (MAXALT) 10 MG tablet Take 1 tablet (10 mg total) by mouth once as needed for up to 1 dose for migraine. May repeat in 2 hours if needed 12 tablet 12  . tamsulosin (FLOMAX) 0.4 MG CAPS capsule TAKE 1 CAPSULE BY MOUTH EVERY DAY 90 capsule 3  . valACYclovir (VALTREX) 1000 MG tablet Take 2t po q12h times 2 doses. Repeat with next fever blister episode. 20 tablet 3   No facility-administered medications prior to visit.    ROS: Review of Systems  Constitutional: Negative for appetite change, fatigue and unexpected weight change.  HENT: Positive for congestion. Negative for nosebleeds, sneezing, sore throat and trouble swallowing.   Eyes: Negative for itching and visual disturbance.  Respiratory: Negative for cough.   Cardiovascular: Negative for chest pain, palpitations and leg swelling.  Gastrointestinal: Negative for abdominal distention, blood in stool, diarrhea and nausea.  Genitourinary: Negative for frequency and hematuria.  Musculoskeletal: Positive for arthralgias, back pain, neck pain and neck stiffness. Negative for gait problem and joint swelling.  Skin: Negative for rash.  Neurological: Positive for headaches. Negative for dizziness, tremors, speech difficulty and weakness.  Psychiatric/Behavioral: Negative for agitation, dysphoric mood and sleep disturbance. The patient is not nervous/anxious.     Objective:  BP 124/78 (BP Location: Left Arm, Patient Position: Sitting, Cuff Size: Normal)   Pulse 62   Temp 98.5 F (36.9 C) (Oral)   Ht 5\' 10"  (1.778 m)   Wt 163 lb (73.9 kg)   SpO2 99%   BMI 23.39 kg/m   BP Readings from Last 3  Encounters:  11/26/19 124/78  11/06/19 104/72  10/12/19 108/67    Wt Readings from Last 3 Encounters:  11/26/19 163 lb (73.9 kg)  11/06/19 161 lb (73 kg)  10/12/19 165 lb (74.8 kg)    Physical Exam Constitutional:      General: He is not in acute distress.    Appearance: He is well-developed.     Comments: NAD  Eyes:     Conjunctiva/sclera: Conjunctivae normal.     Pupils: Pupils are equal, round, and reactive to light.  Neck:     Thyroid: No thyromegaly.     Vascular: No JVD.  Cardiovascular:     Rate and Rhythm: Normal rate and regular rhythm.     Heart sounds: Normal heart sounds. No murmur. No friction rub. No gallop.   Pulmonary:     Effort: Pulmonary effort is normal. No respiratory distress.     Breath sounds: Normal breath sounds. No wheezing or rales.  Chest:     Chest wall: No tenderness.  Abdominal:     General: Bowel sounds are normal. There is no distension.     Palpations: Abdomen is soft. There is no mass.     Tenderness: There is no abdominal tenderness. There is no guarding or rebound.  Musculoskeletal:        General: Tenderness present. Normal range of motion.     Cervical back: Normal range of motion.  Lymphadenopathy:     Cervical: No cervical adenopathy.  Skin:    General: Skin is warm and dry.     Findings: No rash.  Neurological:     Mental Status: He is alert and oriented to person, place, and time.     Cranial Nerves: No cranial nerve deficit.     Motor: No abnormal muscle tone.     Coordination: Coordination normal.     Gait: Gait normal.     Deep Tendon Reflexes: Reflexes are normal and symmetric.  Psychiatric:        Behavior: Behavior normal.        Thought Content: Thought content normal.        Judgment: Judgment normal.   neck  - stiff  Lab Results  Component Value Date   WBC 3.3 (L) 10/03/2018   HGB 14.9 10/03/2018   HCT 43.8 10/03/2018   PLT 267.0 10/03/2018   GLUCOSE 110 (H) 10/03/2018   CHOL 187 02/08/2018   TRIG  92.0 02/08/2018   HDL 55.50 02/08/2018   LDLDIRECT 140.3 10/26/2011   LDLCALC 113 (H) 02/08/2018   ALT 14 10/03/2018   AST 13 10/03/2018   NA 139 10/03/2018   K 4.5 10/03/2018   CL 104 10/03/2018   CREATININE 1.44 10/03/2018   BUN 27 (H) 10/03/2018   CO2 29 10/03/2018   TSH 3.94 02/08/2018  PSA 4.13 (H) 02/08/2018    CT Head Wo Contrast  Result Date: 11/07/2019 CLINICAL DATA:  Headache with increased frequency and intensity over the past year. EXAM: CT HEAD WITHOUT CONTRAST TECHNIQUE: Contiguous axial images were obtained from the base of the skull through the vertex without intravenous contrast. COMPARISON:  None. FINDINGS: Brain: Ventricles, cisterns and other CSF spaces are normal. There is no mass, mass effect, shift of midline structures or acute hemorrhage. No evidence of acute infarction. Vascular: No hyperdense vessel or unexpected calcification. Skull: Normal. Negative for fracture or focal lesion. Sinuses/Orbits: No acute finding. Other: None. IMPRESSION: No acute intracranial findings. Electronically Signed   By: Marin Olp M.D.   On: 11/07/2019 15:26    Assessment & Plan:   There are no diagnoses linked to this encounter.   No orders of the defined types were placed in this encounter.    Follow-up: No follow-ups on file.  Walker Kehr, MD

## 2019-11-26 NOTE — Assessment & Plan Note (Addendum)
Claritin  - use daily CT ok  Maxalt po Duexis prn Increase Gabapentin to tid (taking qd) Neurol ref if not better in 2 wks Labs

## 2019-11-26 NOTE — Assessment & Plan Note (Signed)
On B12 

## 2019-11-27 DIAGNOSIS — M542 Cervicalgia: Secondary | ICD-10-CM | POA: Diagnosis not present

## 2019-11-27 DIAGNOSIS — M545 Low back pain: Secondary | ICD-10-CM | POA: Diagnosis not present

## 2019-11-28 ENCOUNTER — Other Ambulatory Visit: Payer: Self-pay | Admitting: Internal Medicine

## 2019-12-02 ENCOUNTER — Other Ambulatory Visit: Payer: Self-pay | Admitting: Surgery

## 2019-12-03 NOTE — Telephone Encounter (Signed)
Please advise 

## 2019-12-04 DIAGNOSIS — M542 Cervicalgia: Secondary | ICD-10-CM | POA: Diagnosis not present

## 2019-12-04 DIAGNOSIS — M545 Low back pain: Secondary | ICD-10-CM | POA: Diagnosis not present

## 2019-12-07 ENCOUNTER — Encounter: Payer: Federal, State, Local not specified - PPO | Admitting: Physical Medicine and Rehabilitation

## 2019-12-10 ENCOUNTER — Ambulatory Visit: Payer: Federal, State, Local not specified - PPO | Admitting: Specialist

## 2019-12-11 ENCOUNTER — Other Ambulatory Visit: Payer: Self-pay | Admitting: Internal Medicine

## 2019-12-11 DIAGNOSIS — M545 Low back pain: Secondary | ICD-10-CM | POA: Diagnosis not present

## 2019-12-11 DIAGNOSIS — M542 Cervicalgia: Secondary | ICD-10-CM | POA: Diagnosis not present

## 2019-12-18 DIAGNOSIS — M542 Cervicalgia: Secondary | ICD-10-CM | POA: Diagnosis not present

## 2019-12-18 DIAGNOSIS — M545 Low back pain: Secondary | ICD-10-CM | POA: Diagnosis not present

## 2019-12-25 DIAGNOSIS — M542 Cervicalgia: Secondary | ICD-10-CM | POA: Diagnosis not present

## 2019-12-25 DIAGNOSIS — M545 Low back pain: Secondary | ICD-10-CM | POA: Diagnosis not present

## 2019-12-28 ENCOUNTER — Encounter: Payer: Federal, State, Local not specified - PPO | Admitting: Physical Medicine and Rehabilitation

## 2020-01-01 DIAGNOSIS — M545 Low back pain: Secondary | ICD-10-CM | POA: Diagnosis not present

## 2020-01-01 DIAGNOSIS — M542 Cervicalgia: Secondary | ICD-10-CM | POA: Diagnosis not present

## 2020-01-03 ENCOUNTER — Ambulatory Visit: Payer: Federal, State, Local not specified - PPO | Admitting: Specialist

## 2020-01-07 DIAGNOSIS — M545 Low back pain: Secondary | ICD-10-CM | POA: Diagnosis not present

## 2020-01-07 DIAGNOSIS — M542 Cervicalgia: Secondary | ICD-10-CM | POA: Diagnosis not present

## 2020-01-08 DIAGNOSIS — M545 Low back pain: Secondary | ICD-10-CM | POA: Diagnosis not present

## 2020-01-08 DIAGNOSIS — M542 Cervicalgia: Secondary | ICD-10-CM | POA: Diagnosis not present

## 2020-01-10 ENCOUNTER — Other Ambulatory Visit: Payer: Self-pay

## 2020-01-10 ENCOUNTER — Ambulatory Visit (INDEPENDENT_AMBULATORY_CARE_PROVIDER_SITE_OTHER): Payer: Federal, State, Local not specified - PPO | Admitting: Physical Medicine and Rehabilitation

## 2020-01-10 ENCOUNTER — Encounter: Payer: Self-pay | Admitting: Physical Medicine and Rehabilitation

## 2020-01-10 DIAGNOSIS — G8929 Other chronic pain: Secondary | ICD-10-CM

## 2020-01-10 DIAGNOSIS — R202 Paresthesia of skin: Secondary | ICD-10-CM

## 2020-01-10 NOTE — Procedures (Signed)
EMG & NCV Findings: Evaluation of the right median motor nerve showed prolonged distal onset latency (4.8 ms) and decreased conduction velocity (Elbow-Wrist, 48 m/s).  The left median (across palm) sensory nerve showed prolonged distal peak latency (Palm, 2.2 ms).  The right median (across palm) sensory nerve showed prolonged distal peak latency (Wrist, 4.8 ms) and prolonged distal peak latency (Palm, 2.4 ms).  All remaining nerves (as indicated in the following tables) were within normal limits.  Left vs. Right side comparison data for the median motor nerve indicates abnormal L-R latency difference (1.2 ms).  All remaining left vs. right side differences were within normal limits.    All examined muscles (as indicated in the following table) showed no evidence of electrical instability.    Impression: The above electrodiagnostic study is ABNORMAL and reveals evidence of a moderate right median nerve entrapment at the wrist (carpal tunnel syndrome) affecting sensory and motor components.  This represents mild improvement from the last electrodiagnostic study in 2019 which was free carpal tunnel release.  This would represent residual nerve pathology based on his clinical course of complaints.    There is no significant electrodiagnostic evidence of any other focal nerve entrapment, brachial plexopathy or cervical radiculopathy. **In particular there were no findings for the left upper extremity either on nerve conductions or needle EMG.  As you know, this particular electrodiagnostic study cannot rule out chemical radiculitis or sensory only radiculopathy.   Recommendations: 1.  Follow-up with referring physician. 2.  Continue current management of symptoms.  Consider diagnostic carpal tunnel injection on the right.  ___________________________ Jorge Turner Board Certified, American Board of Physical Medicine and Rehabilitation    Nerve Conduction Studies Anti Sensory Summary Table   Stim Site NR Peak (ms) Norm Peak (ms) P-T Amp (V) Norm P-T Amp Site1 Site2 Delta-P (ms) Dist (cm) Vel (m/s) Norm Vel (m/s)  Left Median Acr Palm Anti Sensory (2nd Digit)  29.2C  Wrist    3.6 <3.6 21.8 >10 Wrist Palm 1.4 0.0    Palm    *2.2 <2.0 15.3         Right Median Acr Palm Anti Sensory (2nd Digit)  30.2C  Wrist    *4.8 <3.6 19.4 >10 Wrist Palm 2.4 0.0    Palm    *2.4 <2.0 4.1         Left Radial Anti Sensory (Base 1st Digit)  29C  Wrist    2.2 <3.1 36.7  Wrist Base 1st Digit 2.2 0.0    Right Radial Anti Sensory (Base 1st Digit)  30C  Wrist    2.2 <3.1 21.4  Wrist Base 1st Digit 2.2 0.0    Left Ulnar Anti Sensory (5th Digit)  29.4C  Wrist    3.6 <3.7 21.0 >15.0 Wrist 5th Digit 3.6 14.0 39 >38  Right Ulnar Anti Sensory (5th Digit)  30.3C  Wrist    3.3 <3.7 20.0 >15.0 Wrist 5th Digit 3.3 14.0 42 >38   Motor Summary Table   Stim Site NR Onset (ms) Norm Onset (ms) O-P Amp (mV) Norm O-P Amp Site1 Site2 Delta-0 (ms) Dist (cm) Vel (m/s) Norm Vel (m/s)  Left Median Motor (Abd Poll Brev)  29.1C  Wrist    3.6 <4.2 8.3 >5 Elbow Wrist 4.3 23.0 53 >50  Elbow    7.9  8.1         Right Median Motor (Abd Poll Brev)  30.1C  Wrist    *4.8 <4.2 5.7 >5 Elbow Wrist 5.0  23.8 *48 >50  Elbow    9.8  5.5         Left Ulnar Motor (Abd Dig Min)  29.2C  Wrist    3.2 <4.2 10.0 >3 B Elbow Wrist 4.0 23.0 58 >53  B Elbow    7.2  9.5  A Elbow B Elbow 1.9 10.0 53 >53  A Elbow    9.1  9.4         Right Ulnar Motor (Abd Dig Min)  30.3C  Wrist    2.8 <4.2 10.1 >3 B Elbow Wrist 3.8 22.5 59 >53  B Elbow    6.6  10.2  A Elbow B Elbow 1.7 10.0 59 >53  A Elbow    8.3  9.7          EMG   Side Muscle Nerve Root Ins Act Fibs Psw Amp Dur Poly Recrt Int Fraser Din Comment  Right 1stDorInt Ulnar C8-T1 Nml Nml Nml Nml Nml 0 Nml Nml   Right ExtDigCom   Nml Nml Nml Nml Nml 0 Nml Nml   Left 1stDorInt Ulnar C8-T1 Nml Nml Nml Nml Nml 0 Nml Nml   Left Abd Poll Brev Median C8-T1 Nml Nml Nml Nml Nml 0 Nml Nml   Left  ExtDigCom   Nml Nml Nml Nml Nml 0 Nml Nml   Left Triceps Radial C6-7-8 Nml Nml Nml Nml Nml 0 Nml Nml   Left Deltoid Axillary C5-6 Nml Nml Nml Nml Nml 0 Nml Nml     Nerve Conduction Studies Anti Sensory Left/Right Comparison   Stim Site L Lat (ms) R Lat (ms) L-R Lat (ms) L Amp (V) R Amp (V) L-R Amp (%) Site1 Site2 L Vel (m/s) R Vel (m/s) L-R Vel (m/s)  Median Acr Palm Anti Sensory (2nd Digit)  29.2C  Wrist 3.6 *4.8 1.2 21.8 19.4 11.0 Wrist Palm     Palm *2.2 *2.4 0.2 15.3 4.1 73.2       Radial Anti Sensory (Base 1st Digit)  29C  Wrist 2.2 2.2 0.0 36.7 21.4 41.7 Wrist Base 1st Digit     Ulnar Anti Sensory (5th Digit)  29.4C  Wrist 3.6 3.3 0.3 21.0 20.0 4.8 Wrist 5th Digit 39 42 3   Motor Left/Right Comparison   Stim Site L Lat (ms) R Lat (ms) L-R Lat (ms) L Amp (mV) R Amp (mV) L-R Amp (%) Site1 Site2 L Vel (m/s) R Vel (m/s) L-R Vel (m/s)  Median Motor (Abd Poll Brev)  29.1C  Wrist 3.6 *4.8 *1.2 8.3 5.7 31.3 Elbow Wrist 53 *48 5  Elbow 7.9 9.8 1.9 8.1 5.5 32.1       Ulnar Motor (Abd Dig Min)  29.2C  Wrist 3.2 2.8 0.4 10.0 10.1 1.0 B Elbow Wrist 58 59 1  B Elbow 7.2 6.6 0.6 9.5 10.2 6.9 A Elbow B Elbow 53 59 6  A Elbow 9.1 8.3 0.8 9.4 9.7 3.1          Waveforms:

## 2020-01-10 NOTE — Progress Notes (Signed)
 .  Numeric Pain Rating Scale and Functional Assessment Average Pain 6   In the last MONTH (on 0-10 scale) has pain interfered with the following?  1. General activity like being  able to carry out your everyday physical activities such as walking, climbing stairs, carrying groceries, or moving a chair?  Rating(6)   +Driver, -BT, -Dye Allergies.  
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
## 2020-01-15 DIAGNOSIS — M545 Low back pain: Secondary | ICD-10-CM | POA: Diagnosis not present

## 2020-01-15 DIAGNOSIS — M542 Cervicalgia: Secondary | ICD-10-CM | POA: Diagnosis not present

## 2020-01-22 DIAGNOSIS — M542 Cervicalgia: Secondary | ICD-10-CM | POA: Diagnosis not present

## 2020-01-22 DIAGNOSIS — M545 Low back pain: Secondary | ICD-10-CM | POA: Diagnosis not present

## 2020-01-28 ENCOUNTER — Other Ambulatory Visit: Payer: Self-pay

## 2020-01-28 ENCOUNTER — Ambulatory Visit (INDEPENDENT_AMBULATORY_CARE_PROVIDER_SITE_OTHER): Payer: Federal, State, Local not specified - PPO | Admitting: Internal Medicine

## 2020-01-28 ENCOUNTER — Encounter: Payer: Self-pay | Admitting: Internal Medicine

## 2020-01-28 DIAGNOSIS — J988 Other specified respiratory disorders: Secondary | ICD-10-CM

## 2020-01-28 MED ORDER — BENZONATATE 200 MG PO CAPS: 200.0000 mg | ORAL_CAPSULE | Freq: Three times a day (TID) | ORAL | 1 refills | Status: DC | PRN

## 2020-01-28 MED ORDER — PROMETHAZINE-CODEINE 6.25-10 MG/5ML PO SYRP: 5.0000 mL | ORAL_SOLUTION | Freq: Four times a day (QID) | ORAL | 0 refills | Status: DC | PRN

## 2020-01-28 MED ORDER — CEFUROXIME AXETIL 250 MG PO TABS: 250.0000 mg | ORAL_TABLET | Freq: Two times a day (BID) | ORAL | 0 refills | Status: DC

## 2020-01-28 NOTE — Assessment & Plan Note (Signed)
Ceftin po if worse Prom-cod syr prn Tessalon po prn COVID test

## 2020-01-28 NOTE — Progress Notes (Signed)
Virtual Visit via Video Note  I connected with Jorge Turner on 01/28/20 at  3:40 PM EST by a video enabled telemedicine application and verified that I am speaking with the correct person using two identifiers.   I discussed the limitations of evaluation and management by telemedicine and the availability of in person appointments. The patient expressed understanding and agreed to proceed.  History of Present Illness: C/o cough x 1 week; dry - bad at times. No fever. No COVID contacts  There has been no runny nose, chest pain, shortness of breath, abdominal pain, diarrhea, constipation,  skin rashes.   Observations/Objective: The patient appears to be in no acute distress, looks well.  Assessment and Plan:  See my Assessment and Plan. Follow Up Instructions:    I discussed the assessment and treatment plan with the patient. The patient was provided an opportunity to ask questions and all were answered. The patient agreed with the plan and demonstrated an understanding of the instructions.   The patient was advised to call back or seek an in-person evaluation if the symptoms worsen or if the condition fails to improve as anticipated.  I provided face-to-face time during this encounter. We were at different locations.   Walker Kehr, MD

## 2020-01-30 DIAGNOSIS — Z20822 Contact with and (suspected) exposure to covid-19: Secondary | ICD-10-CM | POA: Diagnosis not present

## 2020-01-30 DIAGNOSIS — R05 Cough: Secondary | ICD-10-CM | POA: Diagnosis not present

## 2020-02-08 ENCOUNTER — Ambulatory Visit: Payer: Federal, State, Local not specified - PPO | Admitting: Specialist

## 2020-02-12 DIAGNOSIS — M542 Cervicalgia: Secondary | ICD-10-CM | POA: Diagnosis not present

## 2020-02-12 DIAGNOSIS — M545 Low back pain: Secondary | ICD-10-CM | POA: Diagnosis not present

## 2020-02-19 DIAGNOSIS — M545 Low back pain: Secondary | ICD-10-CM | POA: Diagnosis not present

## 2020-02-19 DIAGNOSIS — M542 Cervicalgia: Secondary | ICD-10-CM | POA: Diagnosis not present

## 2020-02-20 DIAGNOSIS — Z23 Encounter for immunization: Secondary | ICD-10-CM | POA: Diagnosis not present

## 2020-02-26 DIAGNOSIS — M542 Cervicalgia: Secondary | ICD-10-CM | POA: Diagnosis not present

## 2020-02-26 DIAGNOSIS — M545 Low back pain: Secondary | ICD-10-CM | POA: Diagnosis not present

## 2020-03-04 DIAGNOSIS — M545 Low back pain: Secondary | ICD-10-CM | POA: Diagnosis not present

## 2020-03-04 DIAGNOSIS — M542 Cervicalgia: Secondary | ICD-10-CM | POA: Diagnosis not present

## 2020-03-07 ENCOUNTER — Ambulatory Visit: Payer: Self-pay

## 2020-03-07 ENCOUNTER — Encounter: Payer: Self-pay | Admitting: Specialist

## 2020-03-07 ENCOUNTER — Other Ambulatory Visit: Payer: Self-pay

## 2020-03-07 ENCOUNTER — Ambulatory Visit: Payer: Federal, State, Local not specified - PPO | Admitting: Specialist

## 2020-03-07 VITALS — BP 121/77 | HR 55 | Ht 70.0 in | Wt 163.0 lb

## 2020-03-07 DIAGNOSIS — M4722 Other spondylosis with radiculopathy, cervical region: Secondary | ICD-10-CM

## 2020-03-07 DIAGNOSIS — M25531 Pain in right wrist: Secondary | ICD-10-CM | POA: Diagnosis not present

## 2020-03-07 DIAGNOSIS — R29898 Other symptoms and signs involving the musculoskeletal system: Secondary | ICD-10-CM

## 2020-03-07 DIAGNOSIS — R200 Anesthesia of skin: Secondary | ICD-10-CM | POA: Diagnosis not present

## 2020-03-07 DIAGNOSIS — Z9889 Other specified postprocedural states: Secondary | ICD-10-CM | POA: Diagnosis not present

## 2020-03-07 DIAGNOSIS — M7701 Medial epicondylitis, right elbow: Secondary | ICD-10-CM

## 2020-03-07 DIAGNOSIS — M19131 Post-traumatic osteoarthritis, right wrist: Secondary | ICD-10-CM

## 2020-03-07 DIAGNOSIS — M7702 Medial epicondylitis, left elbow: Secondary | ICD-10-CM | POA: Diagnosis not present

## 2020-03-07 DIAGNOSIS — R202 Paresthesia of skin: Secondary | ICD-10-CM

## 2020-03-07 DIAGNOSIS — M4802 Spinal stenosis, cervical region: Secondary | ICD-10-CM

## 2020-03-07 DIAGNOSIS — M47812 Spondylosis without myelopathy or radiculopathy, cervical region: Secondary | ICD-10-CM

## 2020-03-07 NOTE — Patient Instructions (Addendum)
Plan: Right wrist arthritis changes are likely secondary to post traumatic origin with plain radiographs showing worsening of the degeneration seen on previous radiographs 2018 and MRI in 09/2018. Repeat MRI is ordered to assess the right carpal tunnel for persistent compression. Laboratory studies to rule out a rheumatologic cause for progressive degeneration of the right wrist Joints.  Referral to hand surgery for eval and treatment of right wrist osteoarthritis as the pain that is present May require intervention for arthritis which may require a hand specialist for either arthroplasty or fusion surgery.  MRI of wrist ordered.  Splint intermittantly right wrist for pain. Voltaren gel applied to the right wrist top and palm side 3-4 times a day 2 grams. Do not take alleve, naprosyn or motrin or any NSAIDs as your last lab from 10/2019 showed slight decrease in renal function. These medications can cause changes in kidney function and should not be taken with renal disease. Return to go over studies in 3-4 weeks.
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
## 2020-03-09 LAB — RHEUMATOID FACTOR: Rheumatoid fact SerPl-aCnc: <14 IU/mL (ref <14)

## 2020-03-09 LAB — ANA: Anti Nuclear Antibody (ANA): NEGATIVE

## 2020-03-09 LAB — EXTRA LAV TOP TUBE

## 2020-03-09 LAB — C-REACTIVE PROTEIN: CRP: 3 mg/L (ref <8.0)

## 2020-03-12 ENCOUNTER — Other Ambulatory Visit: Payer: Self-pay | Admitting: Internal Medicine

## 2020-03-14 ENCOUNTER — Telehealth: Payer: Self-pay | Admitting: Specialist

## 2020-03-14 NOTE — Telephone Encounter (Signed)
Holding for you.  

## 2020-03-14 NOTE — Telephone Encounter (Signed)
Pt called in stating Dr. Louanne Skye told him to call a week after his appt to get a letter wrote for the pt to submit to the [facility]; pt would like a call when this is ready.   [phone]

## 2020-03-17 NOTE — Telephone Encounter (Signed)
Patient is calling to check the status of the letter for the [place]. Please advise.

## 2020-03-18 DIAGNOSIS — M542 Cervicalgia: Secondary | ICD-10-CM | POA: Diagnosis not present

## 2020-03-18 DIAGNOSIS — M545 Low back pain: Secondary | ICD-10-CM | POA: Diagnosis not present

## 2020-03-28 ENCOUNTER — Telehealth: Payer: Self-pay | Admitting: Specialist

## 2020-03-28 NOTE — Telephone Encounter (Signed)
Saving for you

## 2020-03-28 NOTE — Telephone Encounter (Signed)
Patient called wanting to know the status of the letter than Dr. Louanne Skye was going to write for him.  CB#[phone].  Thank you.

## 2020-03-31 DIAGNOSIS — M25831 Other specified joint disorders, right wrist: Secondary | ICD-10-CM | POA: Diagnosis not present

## 2020-03-31 DIAGNOSIS — S6991XA Unspecified injury of right wrist, hand and finger(s), initial encounter: Secondary | ICD-10-CM | POA: Diagnosis not present

## 2020-04-01 DIAGNOSIS — M545 Low back pain: Secondary | ICD-10-CM | POA: Diagnosis not present

## 2020-04-01 DIAGNOSIS — M542 Cervicalgia: Secondary | ICD-10-CM | POA: Diagnosis not present

## 2020-04-02 NOTE — Telephone Encounter (Signed)
Patient called wanting to know the status of the letter than Dr. Louanne Skye was going to write for him

## 2020-04-04 ENCOUNTER — Other Ambulatory Visit: Payer: Self-pay | Admitting: Orthopedic Surgery

## 2020-04-04 DIAGNOSIS — M25531 Pain in right wrist: Secondary | ICD-10-CM

## 2020-04-04 DIAGNOSIS — S6991XA Unspecified injury of right wrist, hand and finger(s), initial encounter: Secondary | ICD-10-CM

## 2020-04-05 ENCOUNTER — Other Ambulatory Visit: Payer: Federal, State, Local not specified - PPO

## 2020-04-08 DIAGNOSIS — M545 Low back pain: Secondary | ICD-10-CM | POA: Diagnosis not present

## 2020-04-08 DIAGNOSIS — M542 Cervicalgia: Secondary | ICD-10-CM | POA: Diagnosis not present

## 2020-04-11 ENCOUNTER — Other Ambulatory Visit: Payer: Self-pay | Admitting: Internal Medicine

## 2020-04-15 DIAGNOSIS — M542 Cervicalgia: Secondary | ICD-10-CM | POA: Diagnosis not present

## 2020-04-15 DIAGNOSIS — M545 Low back pain: Secondary | ICD-10-CM | POA: Diagnosis not present

## 2020-04-21 ENCOUNTER — Telehealth: Payer: Self-pay | Admitting: Specialist

## 2020-04-21 ENCOUNTER — Ambulatory Visit: Payer: Federal, State, Local not specified - PPO | Admitting: Specialist

## 2020-04-21 NOTE — Telephone Encounter (Signed)
Patient called wanting to know if he still needs to come in even though his MRI is not until June 3rd.  CB#[phone].  Thank you.

## 2020-04-22 DIAGNOSIS — M545 Low back pain: Secondary | ICD-10-CM | POA: Diagnosis not present

## 2020-04-22 DIAGNOSIS — M542 Cervicalgia: Secondary | ICD-10-CM | POA: Diagnosis not present

## 2020-05-01 ENCOUNTER — Ambulatory Visit
Admission: RE | Admit: 2020-05-01 | Discharge: 2020-05-01 | Disposition: A | Discharge status: Home / Self Care | Payer: Federal, State, Local not specified - PPO | Source: Ambulatory Visit | Attending: Orthopedic Surgery | Admitting: Orthopedic Surgery

## 2020-05-01 ENCOUNTER — Other Ambulatory Visit: Payer: Self-pay

## 2020-05-01 DIAGNOSIS — S63591A Other specified sprain of right wrist, initial encounter: Secondary | ICD-10-CM | POA: Diagnosis not present

## 2020-05-01 DIAGNOSIS — S6991XA Unspecified injury of right wrist, hand and finger(s), initial encounter: Secondary | ICD-10-CM

## 2020-05-01 DIAGNOSIS — M25531 Pain in right wrist: Secondary | ICD-10-CM

## 2020-05-01 DIAGNOSIS — M25331 Other instability, right wrist: Secondary | ICD-10-CM | POA: Diagnosis not present

## 2020-05-01 MED ORDER — IOPAMIDOL (ISOVUE-M 200) INJECTION 41%
3.0000 mL | Freq: Once | INTRAMUSCULAR | Status: AC
  Administered 2020-05-01: 3 mL via INTRA_ARTICULAR

## 2020-05-05 ENCOUNTER — Other Ambulatory Visit: Payer: Self-pay | Admitting: Internal Medicine

## 2020-05-05 DIAGNOSIS — M25831 Other specified joint disorders, right wrist: Secondary | ICD-10-CM | POA: Diagnosis not present

## 2020-05-05 DIAGNOSIS — S6991XA Unspecified injury of right wrist, hand and finger(s), initial encounter: Secondary | ICD-10-CM | POA: Diagnosis not present

## 2020-05-06 ENCOUNTER — Encounter: Payer: Self-pay | Admitting: Specialist

## 2020-05-06 ENCOUNTER — Ambulatory Visit: Payer: Federal, State, Local not specified - PPO | Admitting: Specialist

## 2020-05-06 ENCOUNTER — Other Ambulatory Visit: Payer: Self-pay

## 2020-05-06 VITALS — BP 120/73 | HR 56 | Ht 70.0 in | Wt 163.0 lb

## 2020-05-06 DIAGNOSIS — Z9889 Other specified postprocedural states: Secondary | ICD-10-CM | POA: Diagnosis not present

## 2020-05-06 DIAGNOSIS — M4802 Spinal stenosis, cervical region: Secondary | ICD-10-CM | POA: Diagnosis not present

## 2020-05-06 DIAGNOSIS — M4722 Other spondylosis with radiculopathy, cervical region: Secondary | ICD-10-CM | POA: Diagnosis not present

## 2020-05-06 DIAGNOSIS — M542 Cervicalgia: Secondary | ICD-10-CM | POA: Diagnosis not present

## 2020-05-06 DIAGNOSIS — M545 Low back pain: Secondary | ICD-10-CM | POA: Diagnosis not present

## 2020-05-06 DIAGNOSIS — R29898 Other symptoms and signs involving the musculoskeletal system: Secondary | ICD-10-CM

## 2020-05-06 DIAGNOSIS — M19131 Post-traumatic osteoarthritis, right wrist: Secondary | ICD-10-CM | POA: Diagnosis not present

## 2020-05-06 MED ORDER — METAXALONE 800 MG PO TABS: 800.0000 mg | ORAL_TABLET | Freq: Three times a day (TID) | ORAL | 0 refills | Status: DC

## 2020-05-06 MED ORDER — METHYLPREDNISOLONE 4 MG PO TBPK
ORAL_TABLET | ORAL | 0 refills | Status: DC
Start: 2020-05-06 — End: 2020-07-03
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
## 2020-05-13 DIAGNOSIS — M545 Low back pain: Secondary | ICD-10-CM | POA: Diagnosis not present

## 2020-05-13 DIAGNOSIS — M542 Cervicalgia: Secondary | ICD-10-CM | POA: Diagnosis not present

## 2020-05-16 ENCOUNTER — Telehealth: Payer: Self-pay | Admitting: Specialist

## 2020-05-16 NOTE — Telephone Encounter (Signed)
Patient called.   He did not disclose why but is requesting a call back from [place]

## 2020-05-19 ENCOUNTER — Other Ambulatory Visit: Payer: Self-pay | Admitting: Internal Medicine

## 2020-05-19 NOTE — Telephone Encounter (Signed)
Patient  states that you needed him to get you some information so you can do a letter for him, he wants to know what information you need him to get to you? So he can get it here.

## 2020-05-20 DIAGNOSIS — M542 Cervicalgia: Secondary | ICD-10-CM | POA: Diagnosis not present

## 2020-05-20 DIAGNOSIS — M545 Low back pain: Secondary | ICD-10-CM | POA: Diagnosis not present

## 2020-05-26 DIAGNOSIS — S6991XA Unspecified injury of right wrist, hand and finger(s), initial encounter: Secondary | ICD-10-CM | POA: Diagnosis not present

## 2020-05-26 DIAGNOSIS — M25831 Other specified joint disorders, right wrist: Secondary | ICD-10-CM | POA: Diagnosis not present

## 2020-05-27 ENCOUNTER — Other Ambulatory Visit: Payer: Self-pay | Admitting: Orthopedic Surgery

## 2020-05-29 ENCOUNTER — Ambulatory Visit: Payer: Federal, State, Local not specified - PPO | Admitting: Specialist

## 2020-06-03 ENCOUNTER — Other Ambulatory Visit: Payer: Self-pay | Admitting: Internal Medicine

## 2020-06-06 DIAGNOSIS — R7303 Prediabetes: Secondary | ICD-10-CM | POA: Diagnosis not present

## 2020-06-06 DIAGNOSIS — Z0189 Encounter for other specified special examinations: Secondary | ICD-10-CM | POA: Diagnosis not present

## 2020-06-16 ENCOUNTER — Ambulatory Visit: Payer: Federal, State, Local not specified - PPO | Admitting: Specialist

## 2020-06-20 ENCOUNTER — Telehealth: Payer: Self-pay | Admitting: Specialist

## 2020-06-20 NOTE — Telephone Encounter (Signed)
Patient called   Patient wants to know if his appointment Monday is necessary  Call back: [phone]

## 2020-06-20 NOTE — Telephone Encounter (Signed)
Tried calling. No answer. LMVM advising was returning call to discuss question about appt.

## 2020-06-23 ENCOUNTER — Ambulatory Visit: Payer: Federal, State, Local not specified - PPO | Admitting: Specialist

## 2020-06-23 ENCOUNTER — Other Ambulatory Visit: Payer: Self-pay

## 2020-06-23 ENCOUNTER — Encounter: Payer: Self-pay | Admitting: Specialist

## 2020-06-23 VITALS — BP 104/65 | HR 58 | Ht 70.0 in | Wt 165.0 lb

## 2020-06-23 DIAGNOSIS — M4802 Spinal stenosis, cervical region: Secondary | ICD-10-CM

## 2020-06-23 DIAGNOSIS — M25512 Pain in left shoulder: Secondary | ICD-10-CM

## 2020-06-23 DIAGNOSIS — G8929 Other chronic pain: Secondary | ICD-10-CM

## 2020-06-23 DIAGNOSIS — M1711 Unilateral primary osteoarthritis, right knee: Secondary | ICD-10-CM

## 2020-06-23 DIAGNOSIS — M4722 Other spondylosis with radiculopathy, cervical region: Secondary | ICD-10-CM

## 2020-06-23 DIAGNOSIS — M1712 Unilateral primary osteoarthritis, left knee: Secondary | ICD-10-CM

## 2020-06-23 DIAGNOSIS — M25511 Pain in right shoulder: Secondary | ICD-10-CM

## 2020-06-23 DIAGNOSIS — M19131 Post-traumatic osteoarthritis, right wrist: Secondary | ICD-10-CM | POA: Diagnosis not present

## 2020-06-23 MED ORDER — CELECOXIB 200 MG PO CAPS: 200.0000 mg | ORAL_CAPSULE | Freq: Two times a day (BID) | ORAL | 2 refills | Status: DC

## 2020-06-23 NOTE — Telephone Encounter (Signed)
Patient came in for follow up appt today.
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
## 2020-07-03 ENCOUNTER — Other Ambulatory Visit: Payer: Self-pay

## 2020-07-03 ENCOUNTER — Encounter (HOSPITAL_BASED_OUTPATIENT_CLINIC_OR_DEPARTMENT_OTHER): Payer: Self-pay | Admitting: Orthopedic Surgery

## 2020-07-07 ENCOUNTER — Other Ambulatory Visit (HOSPITAL_COMMUNITY): Payer: Federal, State, Local not specified - PPO

## 2020-07-08 ENCOUNTER — Other Ambulatory Visit (HOSPITAL_COMMUNITY)
Admission: RE | Admit: 2020-07-08 | Discharge: 2020-07-08 | Disposition: A | Discharge status: Home / Self Care | Payer: Federal, State, Local not specified - PPO | Source: Ambulatory Visit | Attending: Orthopedic Surgery | Admitting: Orthopedic Surgery

## 2020-07-08 DIAGNOSIS — Z01812 Encounter for preprocedural laboratory examination: Secondary | ICD-10-CM | POA: Diagnosis not present

## 2020-07-08 DIAGNOSIS — Z20822 Contact with and (suspected) exposure to covid-19: Secondary | ICD-10-CM | POA: Diagnosis not present

## 2020-07-08 LAB — SARS CORONAVIRUS 2 (TAT 6-24 HRS): SARS Coronavirus 2: NEGATIVE

## 2020-07-08 NOTE — Progress Notes (Signed)

## 2020-07-10 ENCOUNTER — Ambulatory Visit (HOSPITAL_BASED_OUTPATIENT_CLINIC_OR_DEPARTMENT_OTHER): Payer: Federal, State, Local not specified - PPO | Admitting: Anesthesiology

## 2020-07-10 ENCOUNTER — Ambulatory Visit (HOSPITAL_BASED_OUTPATIENT_CLINIC_OR_DEPARTMENT_OTHER)
Admission: RE | Admit: 2020-07-10 | Discharge: 2020-07-10 | Disposition: A | Discharge status: Home / Self Care | Payer: Federal, State, Local not specified - PPO | Attending: Orthopedic Surgery | Admitting: Orthopedic Surgery

## 2020-07-10 ENCOUNTER — Encounter (HOSPITAL_BASED_OUTPATIENT_CLINIC_OR_DEPARTMENT_OTHER): Payer: Self-pay | Admitting: Orthopedic Surgery

## 2020-07-10 ENCOUNTER — Other Ambulatory Visit: Payer: Self-pay

## 2020-07-10 ENCOUNTER — Encounter (HOSPITAL_BASED_OUTPATIENT_CLINIC_OR_DEPARTMENT_OTHER)
Admission: RE | Disposition: A | Discharge status: Home / Self Care | Payer: Self-pay | Source: Home / Self Care | Attending: Orthopedic Surgery

## 2020-07-10 DIAGNOSIS — K219 Gastro-esophageal reflux disease without esophagitis: Secondary | ICD-10-CM | POA: Insufficient documentation

## 2020-07-10 DIAGNOSIS — Z79899 Other long term (current) drug therapy: Secondary | ICD-10-CM | POA: Diagnosis not present

## 2020-07-10 DIAGNOSIS — S63591A Other specified sprain of right wrist, initial encounter: Secondary | ICD-10-CM | POA: Diagnosis not present

## 2020-07-10 DIAGNOSIS — Z20822 Contact with and (suspected) exposure to covid-19: Secondary | ICD-10-CM | POA: Insufficient documentation

## 2020-07-10 DIAGNOSIS — Y9367 Activity, basketball: Secondary | ICD-10-CM | POA: Diagnosis not present

## 2020-07-10 DIAGNOSIS — X58XXXA Exposure to other specified factors, initial encounter: Secondary | ICD-10-CM | POA: Insufficient documentation

## 2020-07-10 DIAGNOSIS — M1710 Unilateral primary osteoarthritis, unspecified knee: Secondary | ICD-10-CM | POA: Diagnosis not present

## 2020-07-10 DIAGNOSIS — S63511A Sprain of carpal joint of right wrist, initial encounter: Secondary | ICD-10-CM | POA: Diagnosis not present

## 2020-07-10 DIAGNOSIS — Z8249 Family history of ischemic heart disease and other diseases of the circulatory system: Secondary | ICD-10-CM | POA: Insufficient documentation

## 2020-07-10 DIAGNOSIS — Z833 Family history of diabetes mellitus: Secondary | ICD-10-CM | POA: Insufficient documentation

## 2020-07-10 DIAGNOSIS — M24831 Other specific joint derangements of right wrist, not elsewhere classified: Secondary | ICD-10-CM | POA: Diagnosis not present

## 2020-07-10 DIAGNOSIS — N400 Enlarged prostate without lower urinary tract symptoms: Secondary | ICD-10-CM | POA: Insufficient documentation

## 2020-07-10 DIAGNOSIS — M1990 Unspecified osteoarthritis, unspecified site: Secondary | ICD-10-CM | POA: Diagnosis not present

## 2020-07-10 HISTORY — PX: ANTERIOR INTEROSSEOUS NERVE DECOMPRESSION: SHX5735

## 2020-07-10 HISTORY — PX: CARPECTOMY WITH RADIAL STYLOIDECTOMY: SHX5609

## 2020-07-10 SURGERY — CARPECTOMY WITH RADIAL STYLOIDECTOMY
Anesthesia: Monitor Anesthesia Care | Site: Hand | Laterality: Right

## 2020-07-10 MED ORDER — ONDANSETRON HCL 4 MG/2ML IJ SOLN
INTRAMUSCULAR | Status: DC | PRN
  Administered 2020-07-10: 4 mg via INTRAVENOUS

## 2020-07-10 MED ORDER — FENTANYL CITRATE (PF) 100 MCG/2ML IJ SOLN
50.0000 ug | Freq: Once | INTRAMUSCULAR | Status: AC
  Administered 2020-07-10: 50 ug via INTRAVENOUS

## 2020-07-10 MED ORDER — LACTATED RINGERS IV SOLN: INTRAVENOUS | Status: DC

## 2020-07-10 MED ORDER — PROPOFOL 500 MG/50ML IV EMUL
INTRAVENOUS | Status: DC | PRN
  Administered 2020-07-10: 100 ug/kg/min via INTRAVENOUS

## 2020-07-10 MED ORDER — MIDAZOLAM HCL 2 MG/2ML IJ SOLN
2.0000 mg | Freq: Once | INTRAMUSCULAR | Status: AC
  Administered 2020-07-10: 2 mg via INTRAVENOUS

## 2020-07-10 MED ORDER — ROPIVACAINE HCL 5 MG/ML IJ SOLN
INTRAMUSCULAR | Status: DC | PRN
  Administered 2020-07-10: 30 mL via PERINEURAL

## 2020-07-10 MED ORDER — FENTANYL CITRATE (PF) 100 MCG/2ML IJ SOLN: 25.0000 ug | INTRAMUSCULAR | Status: DC | PRN

## 2020-07-10 MED ORDER — DEXAMETHASONE SODIUM PHOSPHATE 10 MG/ML IJ SOLN
INTRAMUSCULAR | Status: DC | PRN
  Administered 2020-07-10: 10 mg

## 2020-07-10 MED ORDER — ONDANSETRON HCL 4 MG/2ML IJ SOLN: 4.0000 mg | Freq: Once | INTRAMUSCULAR | Status: DC | PRN

## 2020-07-10 MED ORDER — CEFAZOLIN SODIUM-DEXTROSE 2-4 GM/100ML-% IV SOLN
2.0000 g | INTRAVENOUS | Status: AC
  Administered 2020-07-10: 2 g via INTRAVENOUS

## 2020-07-10 MED ORDER — PROPOFOL 500 MG/50ML IV EMUL
INTRAVENOUS | Status: AC
  Filled 2020-07-10: qty 50

## 2020-07-10 MED ORDER — MIDAZOLAM HCL 2 MG/2ML IJ SOLN
INTRAMUSCULAR | Status: AC
  Filled 2020-07-10: qty 2

## 2020-07-10 MED ORDER — OXYCODONE HCL 5 MG PO TABS: 5.0000 mg | ORAL_TABLET | Freq: Once | ORAL | Status: DC | PRN

## 2020-07-10 MED ORDER — ONDANSETRON HCL 4 MG/2ML IJ SOLN
INTRAMUSCULAR | Status: AC
  Filled 2020-07-10: qty 2

## 2020-07-10 MED ORDER — CEFAZOLIN SODIUM-DEXTROSE 2-4 GM/100ML-% IV SOLN
INTRAVENOUS | Status: AC
  Filled 2020-07-10: qty 100

## 2020-07-10 MED ORDER — OXYCODONE HCL 5 MG/5ML PO SOLN: 5.0000 mg | Freq: Once | ORAL | Status: DC | PRN

## 2020-07-10 MED ORDER — FENTANYL CITRATE (PF) 100 MCG/2ML IJ SOLN
INTRAMUSCULAR | Status: AC
  Filled 2020-07-10: qty 2

## 2020-07-10 MED ORDER — OXYCODONE-ACETAMINOPHEN 5-325 MG PO TABS: 1.0000 | ORAL_TABLET | ORAL | 0 refills | Status: DC | PRN

## 2020-07-10 MED ORDER — PROPOFOL 10 MG/ML IV BOLUS
INTRAVENOUS | Status: DC | PRN
  Administered 2020-07-10: 30 mg via INTRAVENOUS

## 2020-07-10 SURGICAL SUPPLY — 56 items
APL PRP STRL LF DISP 70% ISPRP (MISCELLANEOUS) ×1
BLADE ARTHRO LOK 4 BEAVER (BLADE) ×2 IMPLANT
BLADE MINI RND TIP GREEN BEAV (BLADE) ×2 IMPLANT
BLADE SURG 15 STRL LF DISP TIS (BLADE) ×1 IMPLANT
BLADE SURG 15 STRL SS (BLADE) ×2
BNDG CMPR 9X4 STRL LF SNTH (GAUZE/BANDAGES/DRESSINGS) ×1
BNDG COHESIVE 3X5 TAN STRL LF (GAUZE/BANDAGES/DRESSINGS) ×2 IMPLANT
BNDG ESMARK 4X9 LF (GAUZE/BANDAGES/DRESSINGS) ×2 IMPLANT
BNDG GAUZE ELAST 4 BULKY (GAUZE/BANDAGES/DRESSINGS) ×2 IMPLANT
CHLORAPREP W/TINT 26 (MISCELLANEOUS) ×2 IMPLANT
CORD BIPOLAR FORCEPS 12FT (ELECTRODE) ×2 IMPLANT
COVER BACK TABLE 60X90IN (DRAPES) ×2 IMPLANT
COVER MAYO STAND STRL (DRAPES) ×2 IMPLANT
COVER WAND RF STERILE (DRAPES) IMPLANT
CUFF TOURN SGL QUICK 18X4 (TOURNIQUET CUFF) ×2 IMPLANT
DECANTER SPIKE VIAL GLASS SM (MISCELLANEOUS) IMPLANT
DRAPE EXTREMITY T 121X128X90 (DISPOSABLE) ×2 IMPLANT
DRAPE OEC MINIVIEW 54X84 (DRAPES) ×2 IMPLANT
DRAPE SURG 17X23 STRL (DRAPES) ×2 IMPLANT
GAUZE SPONGE 4X4 12PLY STRL (GAUZE/BANDAGES/DRESSINGS) ×2 IMPLANT
GAUZE XEROFORM 1X8 LF (GAUZE/BANDAGES/DRESSINGS) ×2 IMPLANT
GLOVE BIO SURGEON STRL SZ7 (GLOVE) ×4 IMPLANT
GLOVE BIO SURGEON STRL SZ7.5 (GLOVE) ×2 IMPLANT
GLOVE BIOGEL PI IND STRL 7.0 (GLOVE) ×1 IMPLANT
GLOVE BIOGEL PI IND STRL 8 (GLOVE) ×1 IMPLANT
GLOVE BIOGEL PI IND STRL 8.5 (GLOVE) ×1 IMPLANT
GLOVE BIOGEL PI INDICATOR 7.0 (GLOVE) ×1
GLOVE BIOGEL PI INDICATOR 8 (GLOVE) ×1
GLOVE BIOGEL PI INDICATOR 8.5 (GLOVE) ×1
GLOVE SURG ORTHO 8.0 STRL STRW (GLOVE) ×2 IMPLANT
GOWN STRL REUS W/ TWL LRG LVL3 (GOWN DISPOSABLE) ×1 IMPLANT
GOWN STRL REUS W/TWL LRG LVL3 (GOWN DISPOSABLE) ×2
GOWN STRL REUS W/TWL XL LVL3 (GOWN DISPOSABLE) ×4 IMPLANT
NS IRRIG 1000ML POUR BTL (IV SOLUTION) ×2 IMPLANT
PACK BASIN DAY SURGERY FS (CUSTOM PROCEDURE TRAY) ×2 IMPLANT
PAD CAST 3X4 CTTN HI CHSV (CAST SUPPLIES) ×1 IMPLANT
PADDING CAST ABS 3INX4YD NS (CAST SUPPLIES)
PADDING CAST ABS 4INX4YD NS (CAST SUPPLIES) ×1
PADDING CAST ABS COTTON 3X4 (CAST SUPPLIES) IMPLANT
PADDING CAST ABS COTTON 4X4 ST (CAST SUPPLIES) ×1 IMPLANT
PADDING CAST COTTON 3X4 STRL (CAST SUPPLIES) ×2
SLEEVE SCD COMPRESS KNEE MED (MISCELLANEOUS) IMPLANT
SLING ARM FOAM STRAP LRG (SOFTGOODS) ×2 IMPLANT
SPLINT PLASTER CAST XFAST 3X15 (CAST SUPPLIES) ×20 IMPLANT
SPLINT PLASTER XTRA FASTSET 3X (CAST SUPPLIES) ×20
STOCKINETTE 4X48 STRL (DRAPES) ×2 IMPLANT
SUT ETHILON 4 0 PS 2 18 (SUTURE) ×2 IMPLANT
SUT VIC AB 0 CT1 27 (SUTURE)
SUT VIC AB 0 CT1 27XBRD ANBCTR (SUTURE) IMPLANT
SUT VIC AB 2-0 SH 27 (SUTURE) ×2
SUT VIC AB 2-0 SH 27XBRD (SUTURE) ×1 IMPLANT
SUT VICRYL 4-0 PS2 18IN ABS (SUTURE) ×2 IMPLANT
SYR BULB EAR ULCER 3OZ GRN STR (SYRINGE) ×2 IMPLANT
SYR CONTROL 10ML LL (SYRINGE) IMPLANT
TOWEL GREEN STERILE FF (TOWEL DISPOSABLE) ×2 IMPLANT
UNDERPAD 30X36 HEAVY ABSORB (UNDERPADS AND DIAPERS) ×2 IMPLANT

## 2020-07-10 NOTE — Op Note (Signed)
I assisted Surgeon(s) and Role:    * Daryll Brod, MD - Primary    Leanora Cover, MD on the Procedure(s): RIGHT PROXIMAL ROW CARPECTOMY WITH RADIAL STYLOIDECTOMY POSTERIOR INTEROSSEOUS NERVE RESECTION on 07/10/2020.  I provided assistance on this case as follows: retraction soft tissues, positioning of arm, removal of bone.  Electronically signed by: Leanora Cover, MD Date: 07/10/2020 Time: 11:17 AM

## 2020-07-10 NOTE — Anesthesia Postprocedure Evaluation (Signed)
Anesthesia Post Note  Patient: Jodean Lima Bednarczyk  Procedure(s) Performed: RIGHT PROXIMAL ROW CARPECTOMY WITH RADIAL STYLOIDECTOMY (Right Hand) POSTERIOR INTEROSSEOUS NERVE RESECTION (Right Hand)     Patient location during evaluation: PACU Anesthesia Type: Regional and General Level of consciousness: awake and alert Pain management: pain level controlled Vital Signs Assessment: post-procedure vital signs reviewed and stable Respiratory status: spontaneous breathing, nonlabored ventilation and respiratory function stable Cardiovascular status: blood pressure returned to baseline and stable Postop Assessment: no apparent nausea or vomiting Anesthetic complications: no   No complications documented.  Last Vitals:  Vitals:   07/10/20 1200 07/10/20 1228  BP: (!) 143/92 (!) 150/92  Pulse: 61 (!) 46  Resp: 19 18  Temp:  36.4 C  SpO2: 98% 98%    Last Pain:  Vitals:   07/10/20 1228  TempSrc: Oral  PainSc: 0-No pain                 Lynda Rainwater

## 2020-07-10 NOTE — Discharge Instructions (Addendum)
[facility] Instructions Hand Surgery  Wound Care: Keep your hand elevated above the level of your heart.  Do not allow it to dangle by your side.  Keep the dressing dry and do not remove it unless your doctor advises you to do so.  He will usually change it at the time of your post-op visit.  Moving your fingers is advised to stimulate circulation but will depend on the site of your surgery.  If you have a splint applied, your doctor will advise you regarding movement.  Activity: Do not drive or operate machinery today.  Rest today and then you may return to your normal activity and work as indicated by your physician.  Diet:  Drink liquids today or eat a light diet.  You may resume a regular diet tomorrow.     General expectations: Pain for two to three days. Fingers may become slightly swollen.  Call your doctor if any of the following occur: Severe pain not relieved by pain medication. Elevated temperature. Dressing soaked with blood. Inability to move fingers. White or bluish color to fingers.    Post Anesthesia Home Care Instructions  Activity: Get plenty of rest for the remainder of the day. A responsible individual must stay with you for 24 hours following the procedure.  For the next 24 hours, DO NOT: -Drive a car -Paediatric nurse -Drink alcoholic beverages -Take any medication unless instructed by your physician -Make any legal decisions or sign important papers.  Meals: Start with liquid foods such as gelatin or soup. Progress to regular foods as tolerated. Avoid greasy, spicy, heavy foods. If nausea and/or vomiting occur, drink only clear liquids until the nausea and/or vomiting subsides. Call your physician if vomiting continues.  Special Instructions/Symptoms: Your throat may feel dry or sore from the anesthesia or the breathing tube placed in your throat during surgery. If this causes discomfort, gargle with warm salt water. The discomfort should disappear  within 24 hours.  If you had a scopolamine patch placed behind your ear for the management of post- operative nausea and/or vomiting:  1. The medication in the patch is effective for 72 hours, after which it should be removed.  Wrap patch in a tissue and discard in the trash. Wash hands thoroughly with soap and water. 2. You may remove the patch earlier than 72 hours if you experience unpleasant side effects which may include dry mouth, dizziness or visual disturbances. 3. Avoid touching the patch. Wash your hands with soap and water after contact with the patch.    Regional Anesthesia Blocks  1. Numbness or the inability to move the "blocked" extremity may last from 3-48 hours after placement. The length of time depends on the medication injected and your individual response to the medication. If the numbness is not going away after 48 hours, call your surgeon.  2. The extremity that is blocked will need to be protected until the numbness is gone and the  Strength has returned. Because you cannot feel it, you will need to take extra care to avoid injury. Because it may be weak, you may have difficulty moving it or using it. You may not know what position it is in without looking at it while the block is in effect.  3. For blocks in the legs and feet, returning to weight bearing and walking needs to be done carefully. You will need to wait until the numbness is entirely gone and the strength has returned. You should be able to move  your leg and foot normally before you try and bear weight or walk. You will need someone to be with you when you first try to ensure you do not fall and possibly risk injury.  4. Bruising and tenderness at the needle site are common side effects and will resolve in a few days.  5. Persistent numbness or new problems with movement should be communicated to the surgeon or the [facility] [phone] [place] [phone]).  Excuse from  Work/School  Please excuse Jorge Turner from work beginning today 07/10/2020 for anticipated 3 months. He may then return to work/school with the following restrictions: to be determined at that time.

## 2020-07-10 NOTE — Anesthesia Procedure Notes (Signed)
Anesthesia Regional Block: Axillary brachial plexus block   Pre-Anesthetic Checklist: ,, timeout performed, Correct Patient, Correct Site, Correct Laterality, Correct Procedure, Correct Position, site marked, Risks and benefits discussed,  Surgical consent,  Pre-op evaluation,  At surgeon's request and post-op pain management  Laterality: Right  Prep: chloraprep       Needles:  Injection technique: Single-shot  Needle Type: Echogenic Stimulator Needle     Needle Length: 9cm  Needle Gauge: 21   Needle insertion depth: 6 cm   Additional Needles:   Procedures:,,,, ultrasound used (permanent image in chart),,,,  Narrative:  Start time: 07/10/2020 8:12 AM End time: 07/10/2020 8:19 AM Injection made incrementally with aspirations every 5 mL.  Performed by: Personally  Anesthesiologist: Josephine Igo, MD  Additional Notes: Timeout performed. Patient sedated. Relevant anatomy ID'd using [place]. Incremental 2-66ml injection of LA with frequent aspiration. Patient tolerated procedure well.        Right Axillary Block

## 2020-07-10 NOTE — Progress Notes (Signed)
Assisted Dr. Royce Macadamia with right, ultrasound guided, axillary block. Side rails up, monitors on throughout procedure. See vital signs in flow sheet. Tolerated Procedure well.

## 2020-07-10 NOTE — Anesthesia Preprocedure Evaluation (Signed)
Anesthesia Evaluation  Patient identified by MRN, date of birth, ID band Patient awake    Reviewed: Allergy & Precautions, NPO status , Patient's Chart, lab work & pertinent test results  Airway Mallampati: II  TM Distance: >3 FB Neck ROM: Full    Dental no notable dental hx.    Pulmonary neg pulmonary ROS,    Pulmonary exam normal breath sounds clear to auscultation       Cardiovascular negative cardio ROS Normal cardiovascular exam Rhythm:Regular Rate:Normal     Neuro/Psych  Headaches,  Neuromuscular disease negative psych ROS   GI/Hepatic Neg liver ROS, GERD  Medicated and Controlled,  Endo/Other  negative endocrine ROS  Renal/GU negative Renal ROS  negative genitourinary   Musculoskeletal  (+) Arthritis , Osteoarthritis,  Right wrist abutment syndrome   Abdominal   Peds  Hematology negative hematology ROS (+)   Anesthesia Other Findings   Reproductive/Obstetrics                             Anesthesia Physical Anesthesia Plan  ASA: II  Anesthesia Plan: Regional and MAC   Post-op Pain Management:    Induction:   PONV Risk Score and Plan: 2 and Treatment may vary due to age or medical condition and Propofol infusion  Airway Management Planned: Natural Airway, Simple Face Mask and Nasal Cannula  Additional Equipment:   Intra-op Plan:   Post-operative Plan:   Informed Consent: I have reviewed the patients History and Physical, chart, labs and discussed the procedure including the risks, benefits and alternatives for the proposed anesthesia with the patient or authorized representative who has indicated his/her understanding and acceptance.     Dental advisory given  Plan Discussed with: CRNA and Anesthesiologist  Anesthesia Plan Comments:         Anesthesia Quick Evaluation

## 2020-07-10 NOTE — Transfer of Care (Signed)
Immediate Anesthesia Transfer of Care Note  Patient: Jorge Turner  Procedure(s) Performed: RIGHT PROXIMAL ROW CARPECTOMY WITH RADIAL STYLOIDECTOMY (Right Hand) POSTERIOR INTEROSSEOUS NERVE RESECTION (Right Hand)  Patient Location: PACU  Anesthesia Type:MAC combined with regional for post-op pain  Level of Consciousness: awake, alert  and oriented  Airway & Oxygen Therapy: Patient Spontanous Breathing and Patient connected to face mask oxygen  Post-op Assessment: Report given to RN and Post -op Vital signs reviewed and stable  Post vital signs: Reviewed and stable  Last Vitals:  Vitals Value Taken Time  BP 108/63 07/10/20 1123  Temp    Pulse 54 07/10/20 1129  Resp 12 07/10/20 1129  SpO2 100 % 07/10/20 1129  Vitals shown include unvalidated device data.  Last Pain:  Vitals:   07/10/20 0736  TempSrc: Oral  PainSc: 8       Patients Stated Pain Goal: 4 (11/14/23 4695)  Complications: No complications documented.

## 2020-07-10 NOTE — H&P (Signed)
Jorge Turner is an 64 y.o. male.   Chief Complaint:Right wrist pain HPI: Jorge Turner is a 64 year old right-hand-dominant male referred by Dr. Louanne Skye for consultation regarding pain in his right wrist. He states this is constant sharp in nature with VAS score 810/10. He sustained a fracture of his right distal radius in the [employer] playing basketball. He has had intermittent symptoms of increasing over the past several years. He underwent a carpal tunnel release in 2019 which gave him relief of numbness and tingling but no relief of his pain. He has arthritis in his neck. He has been taking Naprosyn Duexis meloxicam with minimal relief. He has not had any injections. He is complaining of weakness of grip and pinch. He has not had any injections. He is scheduled for an MRI this weekend. He has no history of diabetes thyroid problems arthritis or gout. His family history is positive diabetes and gout. Negative for the remainder. He has been tested for diabetes. He was referred for MRI to rule out scapholunate ligament injury and ulnocarpal abutment this has been done and read out by Dr. Posey Pronto. This reveals a scapholunate ligament injury along with an ulnocarpal abutment. Osteoarthritic changes are present. We have discussed with him various treatment alternatives including ulnar for bone fusion PIN resection ulnar shortening osteotomy proximal carpectomy. He continues to complain of pain nonresponsive to conservative treatment. He states that he has continued pain with any use.    Past Medical History:  Diagnosis Date  . Balanitis   . BPH (benign prostatic hypertrophy)   . DJD (degenerative joint disease) of knee   . Eczema   . GERD (gastroesophageal reflux disease)   . Hemorrhoid   . Low back pain   . Medial meniscus tear    left  . Patellofemoral arthralgia of left knee   . Personal history of colonic polyps 03/03/2010  . Vitamin B12 deficiency     Past Surgical History:  Procedure Laterality Date  .  CARPAL TUNNEL RELEASE Right 04/11/2018   Procedure: RIGHT OPEN CARPAL TUNNEL RELEASE;  Surgeon: Jessy Oto, MD;  Location: [facility];  Service: Orthopedics;  Laterality: Right;  . CIRCUMCISION N/A 07/20/2013   Procedure: CIRCUMCISION ADULT;  Surgeon: Claybon Jabs, MD;  Location: [facility];  Service: Urology;  Laterality: N/A;  . COLONOSCOPY    . CYSTO REMOVAL RIGHT THUMB  2006  . RIGHT HYDROCELECTOMY  11-13-2010    Family History  Problem Relation Age of Onset  . Hypertension Mother   . Diabetes Mother   . Hypertension Father   . Diabetes Father   . Heart disease Father        CABG  . Heart disease Sister        CAD  . Colon cancer Neg Hx   . Rectal cancer Neg Hx   . Stomach cancer Neg Hx    Social History:  reports that he has never smoked. He has never used smokeless tobacco. He reports that he does not drink alcohol and does not use drugs.  Allergies:  Allergies  Allergen Reactions  . Erythromycin Ethylsuccinate Anaphylaxis  . Doxycycline Nausea And Vomiting    No medications prior to admission.    Results for orders placed or performed during the hospital encounter of 07/08/20 (from the past 48 hour(s))  SARS CORONAVIRUS 2 (TAT 6-24 HRS) Nasopharyngeal Nasopharyngeal Swab     Status: None   Collection Time: 07/08/20  2:46 PM   Specimen: Nasopharyngeal  Swab  Result Value Ref Range   SARS Coronavirus 2 NEGATIVE NEGATIVE    Comment: (NOTE) SARS-CoV-2 target nucleic acids are NOT DETECTED.  The SARS-CoV-2 RNA is generally detectable in upper and lower respiratory specimens during the acute phase of infection. Negative results do not preclude SARS-CoV-2 infection, do not rule out co-infections with other pathogens, and should not be used as the sole basis for treatment or other patient management decisions. Negative results must be combined with clinical observations, patient history, and epidemiological information. The  expected result is Negative.  Fact Sheet for Patients: SugarRoll.be  Fact Sheet for Healthcare Providers: https://www.woods-mathews.com/  This test is not yet approved or cleared by the [place] FDA and  has been authorized for detection and/or diagnosis of SARS-CoV-2 by FDA under an Emergency Use Authorization (EUA). This EUA will remain  in effect (meaning this test can be used) for the duration of the COVID-19 declaration under Se ction 564(b)(1) of the Act, 21 U.S.C. section 360bbb-3(b)(1), unless the authorization is terminated or revoked sooner.  Performed at [facility], [place] [address], [place], [place] [address]     No results found.   Pertinent items are noted in HPI.  Height 5\' 10"  (1.778 m), weight 74.8 kg.  General appearance: alert, cooperative and appears stated age Head: Normocephalic, without obvious abnormality Neck: no adenopathy and no JVD Resp: clear to auscultation bilaterally Cardio: regular rate and rhythm, S1, S2 normal, no murmur, click, rub or gallop GI: soft, non-tender; bowel sounds normal; no masses,  no organomegaly Extremities: right wrist pain Pulses: 2+ and symmetric Skin: Skin color, texture, turgor normal. 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  Plan: He has elected to undergo proximal carpectomy taking the chance that this will not hit his capitate with pronation supination. Preperi-and postoperative course been discussed along with risk and complications. He is aware there is no guarantee to the surgery the possibility of infection recurrence injury to arteries nerves tendons complete relief symptoms dystrophy. He is scheduled for proximal carpectomy right wrist with a radial styloidectomy and posterior  interosseous nerve resection for pain preoperatively. This will be scheduled as an outpatient under regional anesthesia. Questions are encouraged and answered to his satisfaction.  Daryll Brod 07/10/2020, 4:50 AM

## 2020-07-10 NOTE — Op Note (Signed)
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   SURGEON: Daryll Brod, M.D.   ASSISTANT: Leanora Cover, MD   ANESTHESIA:  Regional with sedation   INTRAVENOUS FLUIDS:  Per anesthesia flow sheet.   ESTIMATED BLOOD LOSS:  Minimal.   COMPLICATIONS:  None.   SPECIMENS:  none   TOURNIQUET TIME:    Total Tourniquet Time Documented: Upper Arm (Right) - 75 minutes Total: Upper Arm (Right) - 75 minutes    DISPOSITION:  Stable to PACU.   INDICATIONS: Patient is a 64 year old male with a history of right wrist pain.  This is not really responsive to conservative treatment MRI reveals a scapholunate and lunotriquetral tear with ulnocarpal abutment.  He has arthritic changes present MRI.  He is aware that there is no guarantee to the surgery the possibility of infection recurrence injury to arteries nerves tendons incomplete relief of symptoms and dystrophy.  Preoperative area the patient seen the extremity marked by both patient and surgeon antibiotic given a supraclavicular block was carried out without difficulty under the direction the anesthesia department in the preoperative area.  OPERATIVE COURSE: Patient is brought to the operating room placed in supine position prepped using ChloraPrep.  A 3-minute dry time was allowed and a timeout taken confirming patient procedure.  The limb was exsanguinated with an Esmarch bandage turn placed high on the arm was inflated to 250 mmHg.  A longitudinal incision was made over the dorsal aspect of the wrist along the fourth dorsal  compartment carried down through subcutaneous tissue.  Bleeders were electrocauterized with bipolar.  And neural structures were identified protected.  The incision was carried through the extensor retinaculum on the fourth dorsal compartment radial border.  The extensor pollicis longus tendon was identified protected.  The post posterior interosseous nerve was then identified this was resected for approximately 3 cm and coagulated.  A T-incision was then made in the extensor capsule based along the distal margin of the distal radius leaving a cuff of tissue.  The extended carried distally.  This allowed visualization of a significantly rotated scaphoid with a arthritic loss of cartilage on the proximal aspect and a kissing lesion on the distal radius.  The Capitate showed good articular surface proximally.  With blunt sharp dissection using a Roger where the scaphoid was removed in a piecemeal manner.  The radioscaphocapitate ligament was protected throughout this portion of the procedure.  The dissection was carried to the ulnar side with blunt sharp dissection the triquetrum was also notified isolated and removed with a rondure.  The lunate was then attended to next.  This was stabilized with a large perforating towel clip.  This allowed the lunate to be manipulated with significantly rotated into the dorsal position.  The volar capsule was then subperiosteally resected leaving it attached both radially and ulnarly.  Lunate was then removed in 1 piece.  The capitate immediately transposed proximally into the lunate facet.  With traction the joint was opened.  The styloid of the radius was then isolated with sharp and blunt dissection and the distal portion removed with a large rondure.  This was  done to protect the attachment of the radioscaphocapitate ligament.  X-rays were taken AP lateral direction revealed that the capitate lied in the lunate fossa of the distal radius in both AP lateral direction.  The wound  was copious irrigated with saline.  The capsule was then repaired with 2-0 Vicryl sutures.  The extensor retinaculum was repaired with interrupted 4-0 Vicryl sutures.  The subcutaneous tissue was closed with interrupted 4-0 Vicryl and the skin with interrupted 4-0 nylon sutures.  A sterile compressive dressing dorsal palmar splint was applied.  X-rays with the splint on revealed that the capitate lied in the lunate fossa in both AP lateral directions.  Tourniquet was deflated.  Patient was taken to the recovery room for observation in satisfactory condition.  Will be discharged home to return to the hand center [facility] in 1 week Tylenol on Tylenol ibuprofen for pain with Percocet 03/31/2024's for breakthrough.   Daryll Brod, MD Electronically signed, 07/10/20

## 2020-07-10 NOTE — Brief Op Note (Signed)
07/10/2020  11:19 AM  PATIENT:  Jorge Turner  64 y.o. male  PRE-OPERATIVE DIAGNOSIS:  RIGHT WRIST ABUTMENT SYNDROME  POST-OPERATIVE DIAGNOSIS:  RIGHT WRIST ABUTMENT SYNDROME  PROCEDURE:  Procedure(s) with comments: RIGHT PROXIMAL ROW CARPECTOMY WITH RADIAL STYLOIDECTOMY (Right) - AXILLARY BLOCK POSTERIOR INTEROSSEOUS NERVE RESECTION (Right) - AXILLARY BLOCK  SURGEON:  Surgeon(s) and Role:    * Daryll Brod, MD - Primary    * Leanora Cover, MD  PHYSICIAN ASSISTANT: Richardo Priest, MD  ASSISTANTS:    ANESTHESIA:   regional and IV sedation  EBL:  6ml  BLOOD ADMINISTERED:none  DRAINS: none   LOCAL MEDICATIONS USED:  NONE  SPECIMEN:  No Specimen  DISPOSITION OF SPECIMEN:  N/A  COUNTS:  YES  TOURNIQUET:   Total Tourniquet Time Documented: Upper Arm (Right) - 75 minutes Total: Upper Arm (Right) - 75 minutes   DICTATION: .Viviann Spare Dictation  PLAN OF CARE: Discharge to home after PACU  PATIENT DISPOSITION:  PACU - hemodynamically stable.

## 2020-07-11 ENCOUNTER — Encounter (HOSPITAL_BASED_OUTPATIENT_CLINIC_OR_DEPARTMENT_OTHER): Payer: Self-pay | Admitting: Orthopedic Surgery

## 2020-07-16 ENCOUNTER — Other Ambulatory Visit: Payer: Federal, State, Local not specified - PPO

## 2020-07-16 DIAGNOSIS — S6991XD Unspecified injury of right wrist, hand and finger(s), subsequent encounter: Secondary | ICD-10-CM | POA: Diagnosis not present

## 2020-07-16 DIAGNOSIS — M25531 Pain in right wrist: Secondary | ICD-10-CM | POA: Diagnosis not present

## 2020-07-23 DIAGNOSIS — S6991XA Unspecified injury of right wrist, hand and finger(s), initial encounter: Secondary | ICD-10-CM | POA: Diagnosis not present

## 2020-08-06 DIAGNOSIS — Z4789 Encounter for other orthopedic aftercare: Secondary | ICD-10-CM | POA: Diagnosis not present

## 2020-08-06 DIAGNOSIS — S6991XD Unspecified injury of right wrist, hand and finger(s), subsequent encounter: Secondary | ICD-10-CM | POA: Diagnosis not present

## 2020-08-08 ENCOUNTER — Other Ambulatory Visit: Payer: Federal, State, Local not specified - PPO

## 2020-08-11 ENCOUNTER — Ambulatory Visit: Payer: Federal, State, Local not specified - PPO | Admitting: Specialist

## 2020-08-11 DIAGNOSIS — M25531 Pain in right wrist: Secondary | ICD-10-CM | POA: Diagnosis not present

## 2020-08-11 DIAGNOSIS — M25631 Stiffness of right wrist, not elsewhere classified: Secondary | ICD-10-CM | POA: Diagnosis not present

## 2020-08-11 DIAGNOSIS — R29898 Other symptoms and signs involving the musculoskeletal system: Secondary | ICD-10-CM | POA: Diagnosis not present

## 2020-08-11 DIAGNOSIS — S6991XD Unspecified injury of right wrist, hand and finger(s), subsequent encounter: Secondary | ICD-10-CM | POA: Diagnosis not present

## 2020-08-18 DIAGNOSIS — S6991XD Unspecified injury of right wrist, hand and finger(s), subsequent encounter: Secondary | ICD-10-CM | POA: Diagnosis not present

## 2020-08-18 DIAGNOSIS — M25631 Stiffness of right wrist, not elsewhere classified: Secondary | ICD-10-CM | POA: Diagnosis not present

## 2020-08-18 DIAGNOSIS — R29898 Other symptoms and signs involving the musculoskeletal system: Secondary | ICD-10-CM | POA: Diagnosis not present

## 2020-08-18 DIAGNOSIS — M25531 Pain in right wrist: Secondary | ICD-10-CM | POA: Diagnosis not present

## 2020-08-26 DIAGNOSIS — R29898 Other symptoms and signs involving the musculoskeletal system: Secondary | ICD-10-CM | POA: Diagnosis not present

## 2020-08-26 DIAGNOSIS — M25531 Pain in right wrist: Secondary | ICD-10-CM | POA: Diagnosis not present

## 2020-08-26 DIAGNOSIS — S6991XD Unspecified injury of right wrist, hand and finger(s), subsequent encounter: Secondary | ICD-10-CM | POA: Diagnosis not present

## 2020-08-26 DIAGNOSIS — M25631 Stiffness of right wrist, not elsewhere classified: Secondary | ICD-10-CM | POA: Diagnosis not present

## 2020-09-01 DIAGNOSIS — Z4789 Encounter for other orthopedic aftercare: Secondary | ICD-10-CM | POA: Diagnosis not present

## 2020-09-01 DIAGNOSIS — M25831 Other specified joint disorders, right wrist: Secondary | ICD-10-CM | POA: Diagnosis not present

## 2020-09-04 ENCOUNTER — Other Ambulatory Visit: Payer: Self-pay | Admitting: Internal Medicine

## 2020-09-10 DIAGNOSIS — M25831 Other specified joint disorders, right wrist: Secondary | ICD-10-CM | POA: Diagnosis not present

## 2020-09-10 DIAGNOSIS — M25631 Stiffness of right wrist, not elsewhere classified: Secondary | ICD-10-CM | POA: Diagnosis not present

## 2020-09-10 DIAGNOSIS — S6991XA Unspecified injury of right wrist, hand and finger(s), initial encounter: Secondary | ICD-10-CM | POA: Diagnosis not present

## 2020-09-10 DIAGNOSIS — R29898 Other symptoms and signs involving the musculoskeletal system: Secondary | ICD-10-CM | POA: Diagnosis not present

## 2020-09-10 DIAGNOSIS — M25531 Pain in right wrist: Secondary | ICD-10-CM | POA: Diagnosis not present

## 2020-09-15 DIAGNOSIS — M25831 Other specified joint disorders, right wrist: Secondary | ICD-10-CM | POA: Diagnosis not present

## 2020-09-15 DIAGNOSIS — R29898 Other symptoms and signs involving the musculoskeletal system: Secondary | ICD-10-CM | POA: Diagnosis not present

## 2020-09-15 DIAGNOSIS — M25631 Stiffness of right wrist, not elsewhere classified: Secondary | ICD-10-CM | POA: Diagnosis not present

## 2020-09-15 DIAGNOSIS — M25531 Pain in right wrist: Secondary | ICD-10-CM | POA: Diagnosis not present

## 2020-09-22 DIAGNOSIS — M25631 Stiffness of right wrist, not elsewhere classified: Secondary | ICD-10-CM | POA: Diagnosis not present

## 2020-09-22 DIAGNOSIS — R29898 Other symptoms and signs involving the musculoskeletal system: Secondary | ICD-10-CM | POA: Diagnosis not present

## 2020-09-22 DIAGNOSIS — M25831 Other specified joint disorders, right wrist: Secondary | ICD-10-CM | POA: Diagnosis not present

## 2020-09-22 DIAGNOSIS — M25531 Pain in right wrist: Secondary | ICD-10-CM | POA: Diagnosis not present

## 2020-09-29 DIAGNOSIS — R29898 Other symptoms and signs involving the musculoskeletal system: Secondary | ICD-10-CM | POA: Diagnosis not present

## 2020-09-29 DIAGNOSIS — M25831 Other specified joint disorders, right wrist: Secondary | ICD-10-CM | POA: Diagnosis not present

## 2020-09-29 DIAGNOSIS — M25631 Stiffness of right wrist, not elsewhere classified: Secondary | ICD-10-CM | POA: Diagnosis not present

## 2020-09-29 DIAGNOSIS — Z0189 Encounter for other specified special examinations: Secondary | ICD-10-CM | POA: Diagnosis not present

## 2020-09-29 DIAGNOSIS — S6991XA Unspecified injury of right wrist, hand and finger(s), initial encounter: Secondary | ICD-10-CM | POA: Diagnosis not present

## 2020-09-29 DIAGNOSIS — M25531 Pain in right wrist: Secondary | ICD-10-CM | POA: Diagnosis not present

## 2020-10-06 DIAGNOSIS — R29898 Other symptoms and signs involving the musculoskeletal system: Secondary | ICD-10-CM | POA: Diagnosis not present

## 2020-10-06 DIAGNOSIS — S6991XA Unspecified injury of right wrist, hand and finger(s), initial encounter: Secondary | ICD-10-CM | POA: Diagnosis not present

## 2020-10-06 DIAGNOSIS — M25631 Stiffness of right wrist, not elsewhere classified: Secondary | ICD-10-CM | POA: Diagnosis not present

## 2020-10-06 DIAGNOSIS — M25531 Pain in right wrist: Secondary | ICD-10-CM | POA: Diagnosis not present

## 2020-10-17 DIAGNOSIS — M25831 Other specified joint disorders, right wrist: Secondary | ICD-10-CM | POA: Diagnosis not present

## 2020-10-17 DIAGNOSIS — M25631 Stiffness of right wrist, not elsewhere classified: Secondary | ICD-10-CM | POA: Diagnosis not present

## 2020-10-17 DIAGNOSIS — M25531 Pain in right wrist: Secondary | ICD-10-CM | POA: Diagnosis not present

## 2020-10-17 DIAGNOSIS — R29898 Other symptoms and signs involving the musculoskeletal system: Secondary | ICD-10-CM | POA: Diagnosis not present

## 2020-10-20 DIAGNOSIS — M25531 Pain in right wrist: Secondary | ICD-10-CM | POA: Diagnosis not present

## 2020-10-20 DIAGNOSIS — R29898 Other symptoms and signs involving the musculoskeletal system: Secondary | ICD-10-CM | POA: Diagnosis not present

## 2020-10-20 DIAGNOSIS — M25831 Other specified joint disorders, right wrist: Secondary | ICD-10-CM | POA: Diagnosis not present

## 2020-10-20 DIAGNOSIS — M25631 Stiffness of right wrist, not elsewhere classified: Secondary | ICD-10-CM | POA: Diagnosis not present

## 2020-10-21 ENCOUNTER — Encounter: Payer: Self-pay | Admitting: Internal Medicine

## 2020-10-21 ENCOUNTER — Ambulatory Visit: Payer: Federal, State, Local not specified - PPO | Admitting: Internal Medicine

## 2020-10-21 ENCOUNTER — Other Ambulatory Visit: Payer: Self-pay

## 2020-10-21 VITALS — BP 112/72 | HR 71 | Temp 98.2°F | Wt 163.4 lb

## 2020-10-21 DIAGNOSIS — R972 Elevated prostate specific antigen [PSA]: Secondary | ICD-10-CM | POA: Diagnosis not present

## 2020-10-21 DIAGNOSIS — M542 Cervicalgia: Secondary | ICD-10-CM | POA: Diagnosis not present

## 2020-10-21 DIAGNOSIS — R21 Rash and other nonspecific skin eruption: Secondary | ICD-10-CM

## 2020-10-21 DIAGNOSIS — E785 Hyperlipidemia, unspecified: Secondary | ICD-10-CM | POA: Diagnosis not present

## 2020-10-21 DIAGNOSIS — M2550 Pain in unspecified joint: Secondary | ICD-10-CM

## 2020-10-21 MED ORDER — TRIAMCINOLONE ACETONIDE 0.5 % EX CREA
1.0000 | TOPICAL_CREAM | Freq: Three times a day (TID) | CUTANEOUS | 1 refills | Status: AC
Start: 2020-10-21 — End: 2021-10-21

## 2020-10-21 NOTE — Assessment & Plan Note (Signed)
Worsening neck pain Start PT again Try Lion's mane

## 2020-10-21 NOTE — Assessment & Plan Note (Addendum)
elev [place] of 252 ([facility]) Cor calc CT option is discussed 11/21

## 2020-10-21 NOTE — Assessment & Plan Note (Signed)
Worse Start PT again Try Lion's mane

## 2020-10-21 NOTE — Assessment & Plan Note (Signed)
Forearm eczema Triamc cream

## 2020-10-21 NOTE — Patient Instructions (Addendum)
   B-complex with Niacin 100 mg     Cardiac CT calcium scoring test $150 Tel # is [phone]   Computed tomography, more commonly known as a CT or CAT scan, is a diagnostic medical imaging test. Like traditional x-rays, it produces multiple images or pictures of the inside of the body. The cross-sectional images generated during a CT scan can be reformatted in multiple planes. They can even generate three-dimensional images. These images can be viewed on a computer monitor, printed on film or by a 3D printer, or transferred to a CD or DVD. CT images of internal organs, bones, soft tissue and blood vessels provide greater detail than traditional x-rays, particularly of soft tissues and blood vessels. A cardiac CT scan for coronary calcium is a non-invasive way of obtaining information about the presence, location and extent of calcified plaque in the coronary arteries--the vessels that supply oxygen-containing blood to the heart muscle. Calcified plaque results when there is a build-up of fat and other substances under the inner layer of the artery. This material can calcify which signals the presence of atherosclerosis, a disease of the vessel wall, also called coronary artery disease (CAD). People with this disease have an increased risk for heart attacks. In addition, over time, progression of plaque build up (CAD) can narrow the arteries or even close off blood flow to the heart. The result may be chest pain, sometimes called "angina," or a heart attack. Because calcium is a marker of CAD, the amount of calcium detected on a cardiac CT scan is a helpful prognostic tool. The findings on cardiac CT are expressed as a calcium score. Another name for this test is coronary artery calcium scoring.  What are some common uses of the procedure? The goal of cardiac CT scan for calcium scoring is to determine if CAD is present and to what extent, even if there are no symptoms. It is a screening study that  may be recommended by a physician for patients with risk factors for CAD but no clinical symptoms. The major risk factors for CAD are: . high blood cholesterol levels  . family history of heart attacks  . diabetes  . high blood pressure  . cigarette smoking  . overweight or obese  . physical inactivity   A negative cardiac CT scan for calcium scoring shows no calcification within the coronary arteries. This suggests that CAD is absent or so minimal it cannot be seen by this technique. The chance of having a heart attack over the next two to five years is very low under these circumstances. A positive test means that CAD is present, regardless of whether or not the patient is experiencing any symptoms. The amount of calcification--expressed as the calcium score--may help to predict the likelihood of a myocardial infarction (heart attack) in the coming years and helps your medical doctor or cardiologist decide whether the patient may need to take preventive medicine or undertake other measures such as diet and exercise to lower the risk for heart attack. The extent of CAD is graded according to your calcium score:  Calcium Score  Presence of CAD (coronary artery disease)  0 No evidence of CAD   1-10 Minimal evidence of CAD  11-100 Mild evidence of CAD  101-400 Moderate evidence of CAD  Over 400 Extensive evidence of CAD

## 2020-10-21 NOTE — Progress Notes (Signed)
Subjective:  Patient ID: Jorge Turner, male    DOB: 1956/06/27  Age: 64 y.o. MRN: [id number]  CC: Follow-up (6 MONTH F/U)   HPI DEREC MOZINGO presents for R wrist pain - had surgery C/o elevated PSA of 18.5 in 11/21 ([facility]). C/o elev cholesterol of 252  Outpatient Medications Prior to Visit  Medication Sig Dispense Refill  . celecoxib (CELEBREX) 200 MG capsule Take 1 capsule (200 mg total) by mouth 2 (two) times daily. 60 capsule 2  . cholecalciferol (VITAMIN D) 1000 UNITS tablet Take 1,000 Units by mouth daily.      . Cyanocobalamin (VITAMIN B-12) 1000 MCG SUBL Place 1 tablet (1,000 mcg total) under the tongue daily. 100 tablet 3  . diclofenac sodium (VOLTAREN) 1 % GEL APPLY 2 GRAMS TO AFFECTED AREA 4 TIMES A DAY 300 g 3  . DUEXIS 800-26.6 MG TABS TAKE ONE TABLET BY MOUTH THREE TIMES DAILY 90 tablet 1  . finasteride (PROSCAR) 5 MG tablet TAKE 1 TABLET BY MOUTH EVERY DAY 90 tablet 3  . loratadine (CLARITIN) 10 MG tablet TAKE 1 TABLET BY MOUTH EVERY DAY 90 tablet 3  . omeprazole (PRILOSEC) 40 MG capsule TAKE 1 CAPSULE BY MOUTH EVERY DAY 90 capsule 3  . oxyCODONE-acetaminophen (PERCOCET) 5-325 MG tablet Take 1 tablet by mouth every 4 (four) hours as needed for severe pain. 20 tablet 0  . tamsulosin (FLOMAX) 0.4 MG CAPS capsule TAKE 1 CAPSULE BY MOUTH EVERY DAY 90 capsule 3  . valACYclovir (VALTREX) 1000 MG tablet Take 2t po q12h times 2 doses. Repeat with next fever blister episode. 20 tablet 3   No facility-administered medications prior to visit.    ROS: Review of Systems  Constitutional: Negative for appetite change, fatigue and unexpected weight change.  HENT: Negative for congestion, nosebleeds, sneezing, sore throat and trouble swallowing.   Eyes: Negative for itching and visual disturbance.  Respiratory: Negative for cough.   Cardiovascular: Negative for chest pain, palpitations and leg swelling.  Gastrointestinal: Negative for abdominal distention, blood in stool, diarrhea and  nausea.  Genitourinary: Negative for frequency and hematuria.  Musculoskeletal: Positive for arthralgias. Negative for back pain, gait problem, joint swelling and neck pain.  Skin: Positive for rash.  Neurological: Negative for dizziness, tremors, speech difficulty and weakness.  Psychiatric/Behavioral: Negative for agitation, decreased concentration, dysphoric mood, sleep disturbance and suicidal ideas. The patient is not nervous/anxious.     Objective:  BP 112/72 (BP Location: Left Arm)   Pulse 71   Temp 98.2 F (36.8 C) (Oral)   Wt 163 lb 6.4 oz (74.1 kg)   SpO2 97%   BMI 23.45 kg/m   BP Readings from Last 3 Encounters:  10/21/20 112/72  07/10/20 (!) 150/92  06/23/20 104/65    Wt Readings from Last 3 Encounters:  10/21/20 163 lb 6.4 oz (74.1 kg)  07/10/20 161 lb 13.1 oz (73.4 kg)  06/23/20 165 lb (74.8 kg)    Physical Exam Constitutional:      General: He is not in acute distress.    Appearance: He is well-developed.     Comments: NAD  Eyes:     Conjunctiva/sclera: Conjunctivae normal.     Pupils: Pupils are equal, round, and reactive to light.  Neck:     Thyroid: No thyromegaly.     Vascular: No JVD.  Cardiovascular:     Rate and Rhythm: Normal rate and regular rhythm.     Heart sounds: Normal heart sounds. No murmur heard.  No  friction rub. No gallop.   Pulmonary:     Effort: Pulmonary effort is normal. No respiratory distress.     Breath sounds: Normal breath sounds. No wheezing or rales.  Chest:     Chest wall: No tenderness.  Abdominal:     General: Bowel sounds are normal. There is no distension.     Palpations: Abdomen is soft. There is no mass.     Tenderness: There is no abdominal tenderness. There is no guarding or rebound.  Musculoskeletal:        General: Swelling and tenderness present. Normal range of motion.     Cervical back: Normal range of motion.  Lymphadenopathy:     Cervical: No cervical adenopathy.  Skin:    General: Skin is warm  and dry.     Findings: Rash present.  Neurological:     Mental Status: He is alert and oriented to person, place, and time.     Cranial Nerves: No cranial nerve deficit.     Motor: No abnormal muscle tone.     Coordination: Coordination normal.     Gait: Gait normal.     Deep Tendon Reflexes: Reflexes are normal and symmetric.  Psychiatric:        Behavior: Behavior normal.        Thought Content: Thought content normal.        Judgment: Judgment normal.    Forearm eczema L R wrist tender w/ROM, swollen w/a scar Knees - pain w/ROM  Lab Results  Component Value Date   WBC 3.9 (L) 11/26/2019   HGB 15.4 11/26/2019   HCT 46.3 11/26/2019   PLT 265.0 11/26/2019   GLUCOSE 94 11/26/2019   CHOL 187 02/08/2018   TRIG 92.0 02/08/2018   HDL 55.50 02/08/2018   LDLDIRECT 140.3 10/26/2011   LDLCALC 113 (H) 02/08/2018   ALT 14 10/03/2018   AST 13 10/03/2018   NA 139 11/26/2019   K 3.7 11/26/2019   CL 104 11/26/2019   CREATININE 1.55 (H) 11/26/2019   BUN 23 11/26/2019   CO2 28 11/26/2019   TSH 3.94 02/08/2018   PSA 4.13 (H) 02/08/2018    No results found.  Assessment & Plan:   Walker Kehr, MD

## 2020-10-21 NOTE — Assessment & Plan Note (Signed)
Elevated PSA of 18.5 in 11/21 ([facility]) - Urol ref

## 2020-10-27 DIAGNOSIS — R29898 Other symptoms and signs involving the musculoskeletal system: Secondary | ICD-10-CM | POA: Diagnosis not present

## 2020-10-27 DIAGNOSIS — M25631 Stiffness of right wrist, not elsewhere classified: Secondary | ICD-10-CM | POA: Diagnosis not present

## 2020-10-27 DIAGNOSIS — M25531 Pain in right wrist: Secondary | ICD-10-CM | POA: Diagnosis not present

## 2020-10-27 DIAGNOSIS — M25831 Other specified joint disorders, right wrist: Secondary | ICD-10-CM | POA: Diagnosis not present

## 2020-11-04 DIAGNOSIS — R29898 Other symptoms and signs involving the musculoskeletal system: Secondary | ICD-10-CM | POA: Diagnosis not present

## 2020-11-04 DIAGNOSIS — M25631 Stiffness of right wrist, not elsewhere classified: Secondary | ICD-10-CM | POA: Diagnosis not present

## 2020-11-04 DIAGNOSIS — M25831 Other specified joint disorders, right wrist: Secondary | ICD-10-CM | POA: Diagnosis not present

## 2020-11-04 DIAGNOSIS — M25531 Pain in right wrist: Secondary | ICD-10-CM | POA: Diagnosis not present

## 2020-11-05 DIAGNOSIS — M7701 Medial epicondylitis, right elbow: Secondary | ICD-10-CM | POA: Diagnosis not present

## 2020-11-05 DIAGNOSIS — M19031 Primary osteoarthritis, right wrist: Secondary | ICD-10-CM | POA: Diagnosis not present

## 2020-11-05 DIAGNOSIS — M545 Low back pain, unspecified: Secondary | ICD-10-CM | POA: Diagnosis not present

## 2020-11-05 DIAGNOSIS — M542 Cervicalgia: Secondary | ICD-10-CM | POA: Diagnosis not present

## 2020-11-17 DIAGNOSIS — M25531 Pain in right wrist: Secondary | ICD-10-CM | POA: Diagnosis not present

## 2020-11-17 DIAGNOSIS — R29898 Other symptoms and signs involving the musculoskeletal system: Secondary | ICD-10-CM | POA: Diagnosis not present

## 2020-11-17 DIAGNOSIS — M25631 Stiffness of right wrist, not elsewhere classified: Secondary | ICD-10-CM | POA: Diagnosis not present

## 2020-11-17 DIAGNOSIS — M25831 Other specified joint disorders, right wrist: Secondary | ICD-10-CM | POA: Diagnosis not present

## 2020-11-18 DIAGNOSIS — M545 Low back pain, unspecified: Secondary | ICD-10-CM | POA: Diagnosis not present

## 2020-11-18 DIAGNOSIS — M542 Cervicalgia: Secondary | ICD-10-CM | POA: Diagnosis not present

## 2020-11-18 DIAGNOSIS — M7701 Medial epicondylitis, right elbow: Secondary | ICD-10-CM | POA: Diagnosis not present

## 2020-11-18 DIAGNOSIS — M19031 Primary osteoarthritis, right wrist: Secondary | ICD-10-CM | POA: Diagnosis not present

## 2020-11-24 DIAGNOSIS — M25831 Other specified joint disorders, right wrist: Secondary | ICD-10-CM | POA: Diagnosis not present

## 2020-11-24 DIAGNOSIS — M25531 Pain in right wrist: Secondary | ICD-10-CM | POA: Diagnosis not present

## 2020-11-24 DIAGNOSIS — S6991XA Unspecified injury of right wrist, hand and finger(s), initial encounter: Secondary | ICD-10-CM | POA: Diagnosis not present

## 2020-11-24 DIAGNOSIS — R29898 Other symptoms and signs involving the musculoskeletal system: Secondary | ICD-10-CM | POA: Diagnosis not present

## 2020-11-24 DIAGNOSIS — M25631 Stiffness of right wrist, not elsewhere classified: Secondary | ICD-10-CM | POA: Diagnosis not present

## 2020-11-25 DIAGNOSIS — M542 Cervicalgia: Secondary | ICD-10-CM | POA: Diagnosis not present

## 2020-11-25 DIAGNOSIS — M7701 Medial epicondylitis, right elbow: Secondary | ICD-10-CM | POA: Diagnosis not present

## 2020-11-25 DIAGNOSIS — M545 Low back pain, unspecified: Secondary | ICD-10-CM | POA: Diagnosis not present

## 2020-11-25 DIAGNOSIS — M19031 Primary osteoarthritis, right wrist: Secondary | ICD-10-CM | POA: Diagnosis not present

## 2020-12-02 DIAGNOSIS — M7701 Medial epicondylitis, right elbow: Secondary | ICD-10-CM | POA: Diagnosis not present

## 2020-12-02 DIAGNOSIS — M542 Cervicalgia: Secondary | ICD-10-CM | POA: Diagnosis not present

## 2020-12-02 DIAGNOSIS — M545 Low back pain, unspecified: Secondary | ICD-10-CM | POA: Diagnosis not present

## 2020-12-02 DIAGNOSIS — M19031 Primary osteoarthritis, right wrist: Secondary | ICD-10-CM | POA: Diagnosis not present

## 2020-12-08 DIAGNOSIS — M25831 Other specified joint disorders, right wrist: Secondary | ICD-10-CM | POA: Diagnosis not present

## 2020-12-08 DIAGNOSIS — M25531 Pain in right wrist: Secondary | ICD-10-CM | POA: Diagnosis not present

## 2020-12-08 DIAGNOSIS — R29898 Other symptoms and signs involving the musculoskeletal system: Secondary | ICD-10-CM | POA: Diagnosis not present

## 2020-12-08 DIAGNOSIS — M25631 Stiffness of right wrist, not elsewhere classified: Secondary | ICD-10-CM | POA: Diagnosis not present

## 2020-12-09 DIAGNOSIS — M19031 Primary osteoarthritis, right wrist: Secondary | ICD-10-CM | POA: Diagnosis not present

## 2020-12-09 DIAGNOSIS — M25561 Pain in right knee: Secondary | ICD-10-CM | POA: Diagnosis not present

## 2020-12-09 DIAGNOSIS — M542 Cervicalgia: Secondary | ICD-10-CM | POA: Diagnosis not present

## 2020-12-09 DIAGNOSIS — M545 Low back pain, unspecified: Secondary | ICD-10-CM | POA: Diagnosis not present

## 2020-12-16 DIAGNOSIS — M545 Low back pain, unspecified: Secondary | ICD-10-CM | POA: Diagnosis not present

## 2020-12-16 DIAGNOSIS — M542 Cervicalgia: Secondary | ICD-10-CM | POA: Diagnosis not present

## 2020-12-16 DIAGNOSIS — M19031 Primary osteoarthritis, right wrist: Secondary | ICD-10-CM | POA: Diagnosis not present

## 2020-12-16 DIAGNOSIS — M7701 Medial epicondylitis, right elbow: Secondary | ICD-10-CM | POA: Diagnosis not present

## 2020-12-18 DIAGNOSIS — M25531 Pain in right wrist: Secondary | ICD-10-CM | POA: Diagnosis not present

## 2020-12-18 DIAGNOSIS — M25631 Stiffness of right wrist, not elsewhere classified: Secondary | ICD-10-CM | POA: Diagnosis not present

## 2020-12-18 DIAGNOSIS — M25831 Other specified joint disorders, right wrist: Secondary | ICD-10-CM | POA: Diagnosis not present

## 2020-12-18 DIAGNOSIS — R29898 Other symptoms and signs involving the musculoskeletal system: Secondary | ICD-10-CM | POA: Diagnosis not present

## 2020-12-26 DIAGNOSIS — M25831 Other specified joint disorders, right wrist: Secondary | ICD-10-CM | POA: Diagnosis not present

## 2020-12-26 DIAGNOSIS — S6991XA Unspecified injury of right wrist, hand and finger(s), initial encounter: Secondary | ICD-10-CM | POA: Diagnosis not present

## 2020-12-30 DIAGNOSIS — M545 Low back pain, unspecified: Secondary | ICD-10-CM | POA: Diagnosis not present

## 2020-12-30 DIAGNOSIS — M25561 Pain in right knee: Secondary | ICD-10-CM | POA: Diagnosis not present

## 2020-12-30 DIAGNOSIS — M542 Cervicalgia: Secondary | ICD-10-CM | POA: Diagnosis not present

## 2020-12-30 DIAGNOSIS — M19031 Primary osteoarthritis, right wrist: Secondary | ICD-10-CM | POA: Diagnosis not present

## 2021-01-05 DIAGNOSIS — M25561 Pain in right knee: Secondary | ICD-10-CM | POA: Diagnosis not present

## 2021-01-05 DIAGNOSIS — M19031 Primary osteoarthritis, right wrist: Secondary | ICD-10-CM | POA: Diagnosis not present

## 2021-01-05 DIAGNOSIS — M545 Low back pain, unspecified: Secondary | ICD-10-CM | POA: Diagnosis not present

## 2021-01-05 DIAGNOSIS — M542 Cervicalgia: Secondary | ICD-10-CM | POA: Diagnosis not present

## 2021-01-06 DIAGNOSIS — M542 Cervicalgia: Secondary | ICD-10-CM | POA: Diagnosis not present

## 2021-01-06 DIAGNOSIS — M19031 Primary osteoarthritis, right wrist: Secondary | ICD-10-CM | POA: Diagnosis not present

## 2021-01-06 DIAGNOSIS — M25561 Pain in right knee: Secondary | ICD-10-CM | POA: Diagnosis not present

## 2021-01-06 DIAGNOSIS — M545 Low back pain, unspecified: Secondary | ICD-10-CM | POA: Diagnosis not present

## 2021-01-15 ENCOUNTER — Ambulatory Visit: Payer: Self-pay

## 2021-01-15 ENCOUNTER — Ambulatory Visit: Payer: Federal, State, Local not specified - PPO | Admitting: Specialist

## 2021-01-15 ENCOUNTER — Other Ambulatory Visit: Payer: Self-pay

## 2021-01-15 ENCOUNTER — Encounter: Payer: Self-pay | Admitting: Specialist

## 2021-01-15 VITALS — BP 116/76 | HR 61 | Ht 70.0 in | Wt 164.0 lb

## 2021-01-15 DIAGNOSIS — M25562 Pain in left knee: Secondary | ICD-10-CM

## 2021-01-15 DIAGNOSIS — M1711 Unilateral primary osteoarthritis, right knee: Secondary | ICD-10-CM

## 2021-01-15 DIAGNOSIS — R29898 Other symptoms and signs involving the musculoskeletal system: Secondary | ICD-10-CM

## 2021-01-15 DIAGNOSIS — M7541 Impingement syndrome of right shoulder: Secondary | ICD-10-CM

## 2021-01-15 DIAGNOSIS — M1712 Unilateral primary osteoarthritis, left knee: Secondary | ICD-10-CM

## 2021-01-15 DIAGNOSIS — M25511 Pain in right shoulder: Secondary | ICD-10-CM

## 2021-01-15 DIAGNOSIS — M25561 Pain in right knee: Secondary | ICD-10-CM | POA: Diagnosis not present

## 2021-01-15 MED ORDER — DICLOFENAC SODIUM 1 % EX GEL: 4.0000 g | Freq: Four times a day (QID) | CUTANEOUS | 5 refills | Status: DC
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
## 2021-01-20 DIAGNOSIS — M25561 Pain in right knee: Secondary | ICD-10-CM | POA: Diagnosis not present

## 2021-01-20 DIAGNOSIS — M542 Cervicalgia: Secondary | ICD-10-CM | POA: Diagnosis not present

## 2021-01-20 DIAGNOSIS — M545 Low back pain, unspecified: Secondary | ICD-10-CM | POA: Diagnosis not present

## 2021-01-20 DIAGNOSIS — M19031 Primary osteoarthritis, right wrist: Secondary | ICD-10-CM | POA: Diagnosis not present

## 2021-01-27 DIAGNOSIS — M542 Cervicalgia: Secondary | ICD-10-CM | POA: Diagnosis not present

## 2021-01-27 DIAGNOSIS — M545 Low back pain, unspecified: Secondary | ICD-10-CM | POA: Diagnosis not present

## 2021-01-27 DIAGNOSIS — M19031 Primary osteoarthritis, right wrist: Secondary | ICD-10-CM | POA: Diagnosis not present

## 2021-01-27 DIAGNOSIS — M25561 Pain in right knee: Secondary | ICD-10-CM | POA: Diagnosis not present

## 2021-01-28 DIAGNOSIS — R972 Elevated prostate specific antigen [PSA]: Secondary | ICD-10-CM | POA: Diagnosis not present

## 2021-01-28 DIAGNOSIS — R350 Frequency of micturition: Secondary | ICD-10-CM | POA: Diagnosis not present

## 2021-02-03 DIAGNOSIS — M545 Low back pain, unspecified: Secondary | ICD-10-CM | POA: Diagnosis not present

## 2021-02-03 DIAGNOSIS — M25561 Pain in right knee: Secondary | ICD-10-CM | POA: Diagnosis not present

## 2021-02-03 DIAGNOSIS — M19031 Primary osteoarthritis, right wrist: Secondary | ICD-10-CM | POA: Diagnosis not present

## 2021-02-03 DIAGNOSIS — M542 Cervicalgia: Secondary | ICD-10-CM | POA: Diagnosis not present

## 2021-02-16 DIAGNOSIS — R972 Elevated prostate specific antigen [PSA]: Secondary | ICD-10-CM | POA: Diagnosis not present

## 2021-02-24 DIAGNOSIS — M25561 Pain in right knee: Secondary | ICD-10-CM | POA: Diagnosis not present

## 2021-02-24 DIAGNOSIS — M542 Cervicalgia: Secondary | ICD-10-CM | POA: Diagnosis not present

## 2021-02-24 DIAGNOSIS — M545 Low back pain, unspecified: Secondary | ICD-10-CM | POA: Diagnosis not present

## 2021-02-24 DIAGNOSIS — M19031 Primary osteoarthritis, right wrist: Secondary | ICD-10-CM | POA: Diagnosis not present

## 2021-02-26 ENCOUNTER — Other Ambulatory Visit: Payer: Self-pay

## 2021-02-26 ENCOUNTER — Ambulatory Visit: Payer: Federal, State, Local not specified - PPO | Admitting: Specialist

## 2021-02-26 ENCOUNTER — Ambulatory Visit: Payer: Federal, State, Local not specified - PPO | Admitting: Orthopedic Surgery

## 2021-02-26 ENCOUNTER — Encounter: Payer: Self-pay | Admitting: Orthopedic Surgery

## 2021-02-26 DIAGNOSIS — M23204 Derangement of unspecified medial meniscus due to old tear or injury, left knee: Secondary | ICD-10-CM

## 2021-02-26 DIAGNOSIS — M4802 Spinal stenosis, cervical region: Secondary | ICD-10-CM

## 2021-02-26 DIAGNOSIS — M23203 Derangement of unspecified medial meniscus due to old tear or injury, right knee: Secondary | ICD-10-CM | POA: Diagnosis not present

## 2021-02-26 DIAGNOSIS — M25511 Pain in right shoulder: Secondary | ICD-10-CM

## 2021-02-26 DIAGNOSIS — M1712 Unilateral primary osteoarthritis, left knee: Secondary | ICD-10-CM

## 2021-02-26 DIAGNOSIS — M7541 Impingement syndrome of right shoulder: Secondary | ICD-10-CM

## 2021-02-26 MED ORDER — MELOXICAM 15 MG PO TABS
15.0000 mg | ORAL_TABLET | Freq: Every day | ORAL | 1 refills | Status: DC | PRN
Start: 2021-02-26 — End: 2021-09-02
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
## 2021-02-26 NOTE — Progress Notes (Signed)
a 

## 2021-03-01 ENCOUNTER — Encounter: Payer: Self-pay | Admitting: Orthopedic Surgery

## 2021-03-02 DIAGNOSIS — M19031 Primary osteoarthritis, right wrist: Secondary | ICD-10-CM | POA: Diagnosis not present

## 2021-03-02 DIAGNOSIS — M545 Low back pain, unspecified: Secondary | ICD-10-CM | POA: Diagnosis not present

## 2021-03-02 DIAGNOSIS — M25561 Pain in right knee: Secondary | ICD-10-CM | POA: Diagnosis not present

## 2021-03-02 DIAGNOSIS — M542 Cervicalgia: Secondary | ICD-10-CM | POA: Diagnosis not present

## 2021-03-03 DIAGNOSIS — M545 Low back pain, unspecified: Secondary | ICD-10-CM | POA: Diagnosis not present

## 2021-03-03 DIAGNOSIS — M19031 Primary osteoarthritis, right wrist: Secondary | ICD-10-CM | POA: Diagnosis not present

## 2021-03-03 DIAGNOSIS — M25561 Pain in right knee: Secondary | ICD-10-CM | POA: Diagnosis not present

## 2021-03-03 DIAGNOSIS — M542 Cervicalgia: Secondary | ICD-10-CM | POA: Diagnosis not present

## 2021-03-10 DIAGNOSIS — M545 Low back pain, unspecified: Secondary | ICD-10-CM | POA: Diagnosis not present

## 2021-03-10 DIAGNOSIS — M19031 Primary osteoarthritis, right wrist: Secondary | ICD-10-CM | POA: Diagnosis not present

## 2021-03-10 DIAGNOSIS — M542 Cervicalgia: Secondary | ICD-10-CM | POA: Diagnosis not present

## 2021-03-10 DIAGNOSIS — M25561 Pain in right knee: Secondary | ICD-10-CM | POA: Diagnosis not present

## 2021-03-17 DIAGNOSIS — M25561 Pain in right knee: Secondary | ICD-10-CM | POA: Diagnosis not present

## 2021-03-17 DIAGNOSIS — M542 Cervicalgia: Secondary | ICD-10-CM | POA: Diagnosis not present

## 2021-03-17 DIAGNOSIS — M545 Low back pain, unspecified: Secondary | ICD-10-CM | POA: Diagnosis not present

## 2021-03-17 DIAGNOSIS — M19031 Primary osteoarthritis, right wrist: Secondary | ICD-10-CM | POA: Diagnosis not present

## 2021-03-19 ENCOUNTER — Other Ambulatory Visit: Payer: Self-pay

## 2021-03-19 ENCOUNTER — Ambulatory Visit
Admission: RE | Admit: 2021-03-19 | Discharge: 2021-03-19 | Disposition: A | Discharge status: Home / Self Care | Payer: Federal, State, Local not specified - PPO | Source: Ambulatory Visit | Attending: Orthopedic Surgery | Admitting: Orthopedic Surgery

## 2021-03-19 DIAGNOSIS — M7541 Impingement syndrome of right shoulder: Secondary | ICD-10-CM

## 2021-03-19 DIAGNOSIS — S46011A Strain of muscle(s) and tendon(s) of the rotator cuff of right shoulder, initial encounter: Secondary | ICD-10-CM | POA: Diagnosis not present

## 2021-03-19 DIAGNOSIS — G8929 Other chronic pain: Secondary | ICD-10-CM | POA: Diagnosis not present

## 2021-03-19 DIAGNOSIS — M25511 Pain in right shoulder: Secondary | ICD-10-CM | POA: Diagnosis not present

## 2021-03-19 MED ORDER — IOPAMIDOL (ISOVUE-M 200) INJECTION 41%
13.0000 mL | Freq: Once | INTRAMUSCULAR | Status: AC
  Administered 2021-03-19: 13 mL via INTRA_ARTICULAR

## 2021-03-23 ENCOUNTER — Encounter: Payer: Self-pay | Admitting: Physical Medicine and Rehabilitation

## 2021-03-23 ENCOUNTER — Ambulatory Visit (INDEPENDENT_AMBULATORY_CARE_PROVIDER_SITE_OTHER): Payer: Federal, State, Local not specified - PPO | Admitting: Physical Medicine and Rehabilitation

## 2021-03-23 ENCOUNTER — Ambulatory Visit: Payer: Self-pay

## 2021-03-23 ENCOUNTER — Other Ambulatory Visit: Payer: Self-pay

## 2021-03-23 VITALS — BP 127/81 | HR 70

## 2021-03-23 DIAGNOSIS — M5412 Radiculopathy, cervical region: Secondary | ICD-10-CM

## 2021-03-23 MED ORDER — BETAMETHASONE SOD PHOS & ACET 6 (3-3) MG/ML IJ SUSP
12.0000 mg | Freq: Once | INTRAMUSCULAR | Status: AC
  Administered 2021-03-23: 12 mg

## 2021-03-23 NOTE — Progress Notes (Signed)
Pt state neck pain that ravels to his right shoulder and arm around his antecubital area. Pt state everything makes the pain worse. Pt state he uses pain cream to help ease his pain.  Numeric Pain Rating Scale and Functional Assessment Average Pain 5   In the last MONTH (on 0-10 scale) has pain interfered with the following?  1. General activity like being  able to carry out your everyday physical activities such as walking, climbing stairs, carrying groceries, or moving a chair?  Rating(9)   +Driver, -BT, -Dye Allergies.

## 2021-03-23 NOTE — Patient Instructions (Signed)

## 2021-03-24 DIAGNOSIS — M25561 Pain in right knee: Secondary | ICD-10-CM | POA: Diagnosis not present

## 2021-03-24 DIAGNOSIS — M545 Low back pain, unspecified: Secondary | ICD-10-CM | POA: Diagnosis not present

## 2021-03-24 DIAGNOSIS — M542 Cervicalgia: Secondary | ICD-10-CM | POA: Diagnosis not present

## 2021-03-24 DIAGNOSIS — M19031 Primary osteoarthritis, right wrist: Secondary | ICD-10-CM | POA: Diagnosis not present

## 2021-03-24 NOTE — Procedures (Signed)
Cervical Epidural Steroid Injection - Interlaminar Approach with Fluoroscopic Guidance  Patient: Jorge Turner      Date of Birth: 02/09/1956 MRN: [id number] PCP: [name], MD      Visit Date: 03/23/2021   Universal Protocol:    Date/Time: 04/26/225:38 AM  Consent Given By: the patient  Position: PRONE  Additional Comments: Vital signs were monitored before and after the procedure. Patient was prepped and draped in the usual sterile fashion. The correct patient, procedure, and site was verified.   Injection Procedure Details:   Procedure diagnoses: Cervical radiculopathy [M54.12]    Meds Administered:  Meds ordered this encounter  Medications  . betamethasone acetate-betamethasone sodium phosphate (CELESTONE) injection 12 mg     Laterality: Right  Location/Site: C7-T1  Needle: 3.5 in., 20 ga. Tuohy  Needle Placement: Paramedian epidural space  Findings:  -Comments: Excellent flow of contrast into the epidural space.  Procedure Details: Using a paramedian approach from the side mentioned above, the region overlying the inferior lamina was localized under fluoroscopic visualization and the soft tissues overlying this structure were infiltrated with 4 ml. of 1% Lidocaine without Epinephrine. A # 20 gauge, Tuohy needle was inserted into the epidural space using a paramedian approach.  The epidural space was localized using loss of resistance along with contralateral oblique bi-planar fluoroscopic views.  After negative aspirate for air, blood, and CSF, a 2 ml. volume of Isovue-250 was injected into the epidural space and the flow of contrast was observed. Radiographs were obtained for documentation purposes.   The injectate was administered into the level noted above.  Additional Comments:  The patient tolerated the procedure well Dressing: 2 x 2 sterile gauze and Band-Aid    Post-procedure details: Patient was observed during the procedure. Post-procedure  instructions were reviewed.  Patient left the clinic in stable condition.

## 2021-03-24 NOTE — Progress Notes (Signed)
Jorge Turner - 65 y.o. male MRN [id number]  Date of birth: 04/19/1956  Office Visit Note: Visit Date: 03/23/2021 PCP: [name], MD Referred by: [name], MD  Subjective: Chief Complaint  Patient presents with  . Neck - Pain  . Right Shoulder - Pain   HPI:  Jorge Turner is a 65 y.o. male who comes in today at the request of Dr. Anderson Malta for planned Right C7-T1 Cervical epidural steroid injection with fluoroscopic guidance.  The patient has failed conservative care including home exercise, medications, time and activity modification.  This injection will be diagnostic and hopefully therapeutic.  Please see requesting physician notes for further details and justification. MRI reviewed with images and spine model.  MRI reviewed in the note below.  Patient also followed by Dr. Basil Dess in our office.  He has follow-up with Dr. Louanne Skye he will obtain follow-up with Dr. Marlou Sa today to go over MRI of his shoulder.   ROS Otherwise per HPI.  Assessment & Plan: Visit Diagnoses:    ICD-10-CM   1. Cervical radiculopathy  M54.12 XR C-ARM NO REPORT    Epidural Steroid injection    betamethasone acetate-betamethasone sodium phosphate (CELESTONE) injection 12 mg    Plan: No additional findings.   Meds & Orders:  Meds ordered this encounter  Medications  . betamethasone acetate-betamethasone sodium phosphate (CELESTONE) injection 12 mg    Orders Placed This Encounter  Procedures  . XR C-ARM NO REPORT  . Epidural Steroid injection    Follow-up: Return for Anderson Malta, MD.   Procedures: No procedures performed  Cervical Epidural Steroid Injection - Interlaminar Approach with Fluoroscopic Guidance  Patient: Jorge Turner      Date of Birth: 06-14-1956 MRN: [id number] PCP: [name], MD      Visit Date: 03/23/2021   Universal Protocol:    Date/Time: 04/26/225:38 AM  Consent Given By: the patient  Position: PRONE  Additional Comments: Vital  signs were monitored before and after the procedure. Patient was prepped and draped in the usual sterile fashion. The correct patient, procedure, and site was verified.   Injection Procedure Details:   Procedure diagnoses: Cervical radiculopathy [M54.12]    Meds Administered:  Meds ordered this encounter  Medications  . betamethasone acetate-betamethasone sodium phosphate (CELESTONE) injection 12 mg     Laterality: Right  Location/Site: C7-T1  Needle: 3.5 in., 20 ga. Tuohy  Needle Placement: Paramedian epidural space  Findings:  -Comments: Excellent flow of contrast into the epidural space.  Procedure Details: Using a paramedian approach from the side mentioned above, the region overlying the inferior lamina was localized under fluoroscopic visualization and the soft tissues overlying this structure were infiltrated with 4 ml. of 1% Lidocaine without Epinephrine. A # 20 gauge, Tuohy needle was inserted into the epidural space using a paramedian approach.  The epidural space was localized using loss of resistance along with contralateral oblique bi-planar fluoroscopic views.  After negative aspirate for air, blood, and CSF, a 2 ml. volume of Isovue-250 was injected into the epidural space and the flow of contrast was observed. Radiographs were obtained for documentation purposes.   The injectate was administered into the level noted above.  Additional Comments:  The patient tolerated the procedure well Dressing: 2 x 2 sterile gauze and Band-Aid    Post-procedure details: Patient was observed during the procedure. Post-procedure instructions were reviewed.  Patient left the clinic in stable condition.     Clinical  History: 02/01/2018 electrodiagnostic study  Impression:  The above electrodiagnostic study is ABNORMAL and reveals evidence of a moderate to severe right median nerve entrapment at the wrist (carpal tunnel syndrome) affecting sensory and motor components.     There is no significant electrodiagnostic evidence of any other focal nerve entrapment, brachial plexopathy or cervical radiculopathy.  Laurence Spates, MD    --------------------------------------------------------------  03/22/2017 electrodiagnostic study [facility]   NCV & EMG Findings:  Extensive electrodiagnostic testing of the right upper extremity shows:  1. Right median sensory response shows prolonged distal peak latency (5.2 ms). Right ulnar sensory response is within normal limits.  2. Right median motor response shows prolonged distal onset latency (5.2 ms). Right ulnar motor response shows decreased conduction velocity (A Elbow-B Elbow, 40 m/s), with normal latency and amplitude.  3. Sparse chronic motor axon loss changes are seen in the right first dorsal interosseous, abductor indicis brevis, and flexor carpi ulnaris muscles. There is no evidence of active denervation.    Impression:  1. Right median neuropathy at or distal to the wrist, consistent with the clinical diagnosis of carpal tunnel syndrome; moderate in degree electrically.  2. Right ulnar neuropathy with slowing across the elbow, purely demyelinating in type; mild in degree electrically.  3. There is no evidence of a right cervical radiculopathy.      ___________________________  Narda Amber, DO    MRI CERVICAL SPINE WITHOUT CONTRAST  TECHNIQUE: Multiplanar, multisequence MR imaging of the cervical spine was performed. No intravenous contrast was administered.  COMPARISON:  Cervical spine MRI 05/30/2015. Cervical spine radiographs 03/07/2017.  FINDINGS: Alignment: Mild straightening of cervical lordosis compared to 2016. No spondylolisthesis.  Vertebrae: No marrow edema or evidence of acute osseous abnormality. Visualized bone marrow signal is within normal limits.  Cord: Spinal cord signal is within normal limits at all visualized levels.  Posterior Fossa, vertebral arteries,  paraspinal tissues: Cervicomedullary junction is within normal limits. Negative visible posterior fossa. Preserved major vascular flow voids, the left vertebral artery again appears dominant. Negative visible neck soft tissues and left lung apex.  Disc levels:  C2-C3:  Mild endplate spurring.  No stenosis.  C3-C4: Minor disc bulge and endplate spurring. Stable borderline to mild right C4 foraminal stenosis.  C4-C5: Mild circumferential disc bulge and endplate spurring. Mild facet hypertrophy. Ligament flavum hypertrophy has regressed since 2016, but still there is mild spinal stenosis. No cord mass effect. Moderate to severe bilateral C5 foraminal stenosis appears increased.  C5-C6: Chronic disc space loss and circumferential disc osteophyte complex. Stable mild facet and ligament flavum hypertrophy. Mild spinal stenosis. Mild if any spinal cord mass effect. Moderate to severe left and moderate right C6 foraminal stenosis. This level is stable.  C6-C7: Mild posterior element hypertrophy. Borderline to mild bilateral C7 foraminal stenosis is stable.  C7-T1:  Mild posterior element hypertrophy.  No stenosis.  No visible upper thoracic spinal stenosis.  IMPRESSION: 1. No acute osseous abnormality and largely stable MRI appearance of the cervical spine since 2016. 2. Chronic mild spinal stenosis at C4-C5 and C5-C6. Mild if any spinal cord mass effect and no cord signal abnormality. Moderate to severe bilateral C5 neural foraminal stenosis does appear increased since 2016. Moderate to severe left greater than right C6 foraminal stenosis appears stable.   Electronically Signed   By: Genevie Ann M.D.   On: 07/09/2019 21:09     Objective:  VS:  HT:    WT:   BMI:     BP:127/81  HR:70bpm  TEMP: ( )  RESP:  Physical Exam Vitals and nursing note reviewed.  Constitutional:      General: He is not in acute distress.    Appearance: Normal appearance. He is not  ill-appearing.  HENT:     Head: Normocephalic and atraumatic.     Right Ear: External ear normal.     Left Ear: External ear normal.  Eyes:     Extraocular Movements: Extraocular movements intact.  Cardiovascular:     Rate and Rhythm: Normal rate.     Pulses: Normal pulses.  Abdominal:     General: There is no distension.     Palpations: Abdomen is soft.  Musculoskeletal:        General: No signs of injury.     Cervical back: Neck supple. Tenderness present. No rigidity.     Right lower leg: No edema.     Left lower leg: No edema.     Comments: Patient has good strength in the upper extremities with 5 out of 5 strength in wrist extension long finger flexion APB.  No intrinsic hand muscle atrophy.  Negative Hoffmann's test.  Lymphadenopathy:     Cervical: No cervical adenopathy.  Skin:    Findings: No erythema or rash.  Neurological:     General: No focal deficit present.     Mental Status: He is alert and oriented to person, place, and time.     Sensory: No sensory deficit.     Motor: No weakness or abnormal muscle tone.     Coordination: Coordination normal.  Psychiatric:        Mood and Affect: Mood normal.        Behavior: Behavior normal.      Imaging: XR C-ARM NO REPORT  Result Date: 03/23/2021 Please see Notes tab for imaging impression.

## 2021-03-27 DIAGNOSIS — M25562 Pain in left knee: Secondary | ICD-10-CM | POA: Diagnosis not present

## 2021-03-27 DIAGNOSIS — M25511 Pain in right shoulder: Secondary | ICD-10-CM | POA: Diagnosis not present

## 2021-03-27 DIAGNOSIS — M545 Low back pain, unspecified: Secondary | ICD-10-CM | POA: Diagnosis not present

## 2021-03-27 DIAGNOSIS — M25561 Pain in right knee: Secondary | ICD-10-CM | POA: Diagnosis not present

## 2021-03-31 DIAGNOSIS — M545 Low back pain, unspecified: Secondary | ICD-10-CM | POA: Diagnosis not present

## 2021-03-31 DIAGNOSIS — M25561 Pain in right knee: Secondary | ICD-10-CM | POA: Diagnosis not present

## 2021-03-31 DIAGNOSIS — M542 Cervicalgia: Secondary | ICD-10-CM | POA: Diagnosis not present

## 2021-03-31 DIAGNOSIS — M19031 Primary osteoarthritis, right wrist: Secondary | ICD-10-CM | POA: Diagnosis not present

## 2021-04-03 DIAGNOSIS — M25511 Pain in right shoulder: Secondary | ICD-10-CM | POA: Diagnosis not present

## 2021-04-03 DIAGNOSIS — M545 Low back pain, unspecified: Secondary | ICD-10-CM | POA: Diagnosis not present

## 2021-04-03 DIAGNOSIS — M25561 Pain in right knee: Secondary | ICD-10-CM | POA: Diagnosis not present

## 2021-04-03 DIAGNOSIS — M25562 Pain in left knee: Secondary | ICD-10-CM | POA: Diagnosis not present

## 2021-04-09 ENCOUNTER — Ambulatory Visit: Payer: Federal, State, Local not specified - PPO | Admitting: Orthopedic Surgery

## 2021-04-14 DIAGNOSIS — M25561 Pain in right knee: Secondary | ICD-10-CM | POA: Diagnosis not present

## 2021-04-14 DIAGNOSIS — M542 Cervicalgia: Secondary | ICD-10-CM | POA: Diagnosis not present

## 2021-04-14 DIAGNOSIS — M545 Low back pain, unspecified: Secondary | ICD-10-CM | POA: Diagnosis not present

## 2021-04-14 DIAGNOSIS — M19031 Primary osteoarthritis, right wrist: Secondary | ICD-10-CM | POA: Diagnosis not present

## 2021-04-16 ENCOUNTER — Ambulatory Visit: Payer: Federal, State, Local not specified - PPO | Admitting: Specialist

## 2021-04-17 ENCOUNTER — Ambulatory Visit: Payer: Federal, State, Local not specified - PPO | Admitting: Orthopedic Surgery

## 2021-04-17 DIAGNOSIS — M25561 Pain in right knee: Secondary | ICD-10-CM | POA: Diagnosis not present

## 2021-04-17 DIAGNOSIS — M545 Low back pain, unspecified: Secondary | ICD-10-CM | POA: Diagnosis not present

## 2021-04-17 DIAGNOSIS — M25562 Pain in left knee: Secondary | ICD-10-CM | POA: Diagnosis not present

## 2021-04-17 DIAGNOSIS — M25511 Pain in right shoulder: Secondary | ICD-10-CM | POA: Diagnosis not present

## 2021-04-20 ENCOUNTER — Ambulatory Visit: Payer: Federal, State, Local not specified - PPO | Admitting: Internal Medicine

## 2021-04-22 DIAGNOSIS — M542 Cervicalgia: Secondary | ICD-10-CM | POA: Diagnosis not present

## 2021-04-22 DIAGNOSIS — M19031 Primary osteoarthritis, right wrist: Secondary | ICD-10-CM | POA: Diagnosis not present

## 2021-04-22 DIAGNOSIS — M545 Low back pain, unspecified: Secondary | ICD-10-CM | POA: Diagnosis not present

## 2021-04-22 DIAGNOSIS — M25561 Pain in right knee: Secondary | ICD-10-CM | POA: Diagnosis not present

## 2021-04-24 DIAGNOSIS — M25561 Pain in right knee: Secondary | ICD-10-CM | POA: Diagnosis not present

## 2021-04-24 DIAGNOSIS — M545 Low back pain, unspecified: Secondary | ICD-10-CM | POA: Diagnosis not present

## 2021-04-24 DIAGNOSIS — M25562 Pain in left knee: Secondary | ICD-10-CM | POA: Diagnosis not present

## 2021-04-24 DIAGNOSIS — M25511 Pain in right shoulder: Secondary | ICD-10-CM | POA: Diagnosis not present

## 2021-04-27 ENCOUNTER — Other Ambulatory Visit: Payer: Self-pay | Admitting: Orthopedic Surgery

## 2021-04-28 DIAGNOSIS — M542 Cervicalgia: Secondary | ICD-10-CM | POA: Diagnosis not present

## 2021-04-28 DIAGNOSIS — M25561 Pain in right knee: Secondary | ICD-10-CM | POA: Diagnosis not present

## 2021-04-28 DIAGNOSIS — M545 Low back pain, unspecified: Secondary | ICD-10-CM | POA: Diagnosis not present

## 2021-04-28 DIAGNOSIS — M19031 Primary osteoarthritis, right wrist: Secondary | ICD-10-CM | POA: Diagnosis not present

## 2021-04-28 NOTE — Telephone Encounter (Signed)
Looks like he has elevated creatinine and decreased GFR, we should recheck CMP prior to refilling NSAID.

## 2021-04-28 NOTE — Telephone Encounter (Signed)
Pls advise.  

## 2021-04-29 ENCOUNTER — Ambulatory Visit (INDEPENDENT_AMBULATORY_CARE_PROVIDER_SITE_OTHER): Payer: Federal, State, Local not specified - PPO | Admitting: Orthopedic Surgery

## 2021-04-29 ENCOUNTER — Other Ambulatory Visit: Payer: Self-pay

## 2021-04-29 DIAGNOSIS — M7541 Impingement syndrome of right shoulder: Secondary | ICD-10-CM

## 2021-04-29 DIAGNOSIS — M75101 Unspecified rotator cuff tear or rupture of right shoulder, not specified as traumatic: Secondary | ICD-10-CM

## 2021-04-30 ENCOUNTER — Other Ambulatory Visit: Payer: Self-pay | Admitting: Internal Medicine

## 2021-05-01 DIAGNOSIS — M545 Low back pain, unspecified: Secondary | ICD-10-CM | POA: Diagnosis not present

## 2021-05-01 DIAGNOSIS — M25561 Pain in right knee: Secondary | ICD-10-CM | POA: Diagnosis not present

## 2021-05-01 DIAGNOSIS — M542 Cervicalgia: Secondary | ICD-10-CM | POA: Diagnosis not present

## 2021-05-01 DIAGNOSIS — M19031 Primary osteoarthritis, right wrist: Secondary | ICD-10-CM | POA: Diagnosis not present

## 2021-05-03 ENCOUNTER — Encounter: Payer: Self-pay | Admitting: Orthopedic Surgery

## 2021-05-03 DIAGNOSIS — M75101 Unspecified rotator cuff tear or rupture of right shoulder, not specified as traumatic: Secondary | ICD-10-CM

## 2021-05-03 DIAGNOSIS — M7541 Impingement syndrome of right shoulder: Secondary | ICD-10-CM

## 2021-05-03 MED ORDER — LIDOCAINE HCL 1 % IJ SOLN
5.0000 mL | INTRAMUSCULAR | Status: AC | PRN
Start: 2021-05-03 — End: 2021-05-03
  Administered 2021-05-03: 5 mL

## 2021-05-03 MED ORDER — BUPIVACAINE HCL 0.5 % IJ SOLN
9.0000 mL | INTRAMUSCULAR | Status: AC | PRN
  Administered 2021-05-03: 9 mL via INTRA_ARTICULAR

## 2021-05-03 MED ORDER — METHYLPREDNISOLONE ACETATE 40 MG/ML IJ SUSP
40.0000 mg | INTRAMUSCULAR | Status: AC | PRN
  Administered 2021-05-03: 40 mg via INTRA_ARTICULAR
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
## 2021-05-04 DIAGNOSIS — M25562 Pain in left knee: Secondary | ICD-10-CM | POA: Diagnosis not present

## 2021-05-04 DIAGNOSIS — M25561 Pain in right knee: Secondary | ICD-10-CM | POA: Diagnosis not present

## 2021-05-04 DIAGNOSIS — M545 Low back pain, unspecified: Secondary | ICD-10-CM | POA: Diagnosis not present

## 2021-05-04 DIAGNOSIS — M25511 Pain in right shoulder: Secondary | ICD-10-CM | POA: Diagnosis not present

## 2021-05-05 DIAGNOSIS — M19031 Primary osteoarthritis, right wrist: Secondary | ICD-10-CM | POA: Diagnosis not present

## 2021-05-05 DIAGNOSIS — M25561 Pain in right knee: Secondary | ICD-10-CM | POA: Diagnosis not present

## 2021-05-05 DIAGNOSIS — M545 Low back pain, unspecified: Secondary | ICD-10-CM | POA: Diagnosis not present

## 2021-05-05 DIAGNOSIS — M542 Cervicalgia: Secondary | ICD-10-CM | POA: Diagnosis not present

## 2021-05-12 DIAGNOSIS — M19031 Primary osteoarthritis, right wrist: Secondary | ICD-10-CM | POA: Diagnosis not present

## 2021-05-12 DIAGNOSIS — M542 Cervicalgia: Secondary | ICD-10-CM | POA: Diagnosis not present

## 2021-05-12 DIAGNOSIS — M545 Low back pain, unspecified: Secondary | ICD-10-CM | POA: Diagnosis not present

## 2021-05-12 DIAGNOSIS — M25561 Pain in right knee: Secondary | ICD-10-CM | POA: Diagnosis not present

## 2021-05-21 ENCOUNTER — Ambulatory Visit: Payer: Federal, State, Local not specified - PPO | Admitting: Specialist

## 2021-05-21 ENCOUNTER — Encounter: Payer: Self-pay | Admitting: Specialist

## 2021-05-21 ENCOUNTER — Other Ambulatory Visit: Payer: Self-pay

## 2021-05-21 VITALS — BP 120/81 | HR 57 | Ht 70.0 in | Wt 164.0 lb

## 2021-05-21 DIAGNOSIS — M5412 Radiculopathy, cervical region: Secondary | ICD-10-CM | POA: Diagnosis not present

## 2021-05-21 DIAGNOSIS — G5601 Carpal tunnel syndrome, right upper limb: Secondary | ICD-10-CM
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
## 2021-05-24 ENCOUNTER — Other Ambulatory Visit: Payer: Self-pay | Admitting: Internal Medicine

## 2021-05-26 DIAGNOSIS — M545 Low back pain, unspecified: Secondary | ICD-10-CM | POA: Diagnosis not present

## 2021-05-26 DIAGNOSIS — M25561 Pain in right knee: Secondary | ICD-10-CM | POA: Diagnosis not present

## 2021-05-26 DIAGNOSIS — M19031 Primary osteoarthritis, right wrist: Secondary | ICD-10-CM | POA: Diagnosis not present

## 2021-05-26 DIAGNOSIS — M542 Cervicalgia: Secondary | ICD-10-CM | POA: Diagnosis not present

## 2021-06-02 DIAGNOSIS — M545 Low back pain, unspecified: Secondary | ICD-10-CM | POA: Diagnosis not present

## 2021-06-02 DIAGNOSIS — M25561 Pain in right knee: Secondary | ICD-10-CM | POA: Diagnosis not present

## 2021-06-02 DIAGNOSIS — M542 Cervicalgia: Secondary | ICD-10-CM | POA: Diagnosis not present

## 2021-06-02 DIAGNOSIS — M19031 Primary osteoarthritis, right wrist: Secondary | ICD-10-CM | POA: Diagnosis not present

## 2021-06-09 DIAGNOSIS — M19031 Primary osteoarthritis, right wrist: Secondary | ICD-10-CM | POA: Diagnosis not present

## 2021-06-09 DIAGNOSIS — M25561 Pain in right knee: Secondary | ICD-10-CM | POA: Diagnosis not present

## 2021-06-09 DIAGNOSIS — M542 Cervicalgia: Secondary | ICD-10-CM | POA: Diagnosis not present

## 2021-06-09 DIAGNOSIS — M545 Low back pain, unspecified: Secondary | ICD-10-CM | POA: Diagnosis not present

## 2021-06-15 ENCOUNTER — Ambulatory Visit: Payer: Federal, State, Local not specified - PPO | Admitting: Orthopedic Surgery

## 2021-06-16 DIAGNOSIS — M25561 Pain in right knee: Secondary | ICD-10-CM | POA: Diagnosis not present

## 2021-06-16 DIAGNOSIS — M542 Cervicalgia: Secondary | ICD-10-CM | POA: Diagnosis not present

## 2021-06-16 DIAGNOSIS — M545 Low back pain, unspecified: Secondary | ICD-10-CM | POA: Diagnosis not present

## 2021-06-16 DIAGNOSIS — M19031 Primary osteoarthritis, right wrist: Secondary | ICD-10-CM | POA: Diagnosis not present

## 2021-06-26 ENCOUNTER — Ambulatory Visit: Payer: Federal, State, Local not specified - PPO | Admitting: Specialist

## 2021-06-30 ENCOUNTER — Other Ambulatory Visit: Payer: Self-pay | Admitting: Orthopedic Surgery

## 2021-06-30 ENCOUNTER — Other Ambulatory Visit: Payer: Self-pay | Admitting: Specialist

## 2021-06-30 ENCOUNTER — Other Ambulatory Visit: Payer: Self-pay | Admitting: Internal Medicine

## 2021-06-30 DIAGNOSIS — M545 Low back pain, unspecified: Secondary | ICD-10-CM | POA: Diagnosis not present

## 2021-06-30 DIAGNOSIS — M25561 Pain in right knee: Secondary | ICD-10-CM | POA: Diagnosis not present

## 2021-06-30 DIAGNOSIS — M19031 Primary osteoarthritis, right wrist: Secondary | ICD-10-CM | POA: Diagnosis not present

## 2021-06-30 DIAGNOSIS — M542 Cervicalgia: Secondary | ICD-10-CM | POA: Diagnosis not present

## 2021-07-03 NOTE — Telephone Encounter (Signed)
Needs repeat CMP, last GFR 55

## 2021-07-07 DIAGNOSIS — M25561 Pain in right knee: Secondary | ICD-10-CM | POA: Diagnosis not present

## 2021-07-07 DIAGNOSIS — M545 Low back pain, unspecified: Secondary | ICD-10-CM | POA: Diagnosis not present

## 2021-07-07 DIAGNOSIS — M19031 Primary osteoarthritis, right wrist: Secondary | ICD-10-CM | POA: Diagnosis not present

## 2021-07-07 DIAGNOSIS — M542 Cervicalgia: Secondary | ICD-10-CM | POA: Diagnosis not present

## 2021-07-10 DIAGNOSIS — M19031 Primary osteoarthritis, right wrist: Secondary | ICD-10-CM | POA: Diagnosis not present

## 2021-07-10 DIAGNOSIS — M545 Low back pain, unspecified: Secondary | ICD-10-CM | POA: Diagnosis not present

## 2021-07-10 DIAGNOSIS — M542 Cervicalgia: Secondary | ICD-10-CM | POA: Diagnosis not present

## 2021-07-10 DIAGNOSIS — M25561 Pain in right knee: Secondary | ICD-10-CM | POA: Diagnosis not present

## 2021-07-14 ENCOUNTER — Encounter: Payer: Self-pay | Admitting: Physical Medicine and Rehabilitation

## 2021-07-14 ENCOUNTER — Other Ambulatory Visit: Payer: Self-pay

## 2021-07-14 ENCOUNTER — Ambulatory Visit (INDEPENDENT_AMBULATORY_CARE_PROVIDER_SITE_OTHER): Payer: Federal, State, Local not specified - PPO | Admitting: Physical Medicine and Rehabilitation

## 2021-07-14 DIAGNOSIS — M19031 Primary osteoarthritis, right wrist: Secondary | ICD-10-CM | POA: Diagnosis not present

## 2021-07-14 DIAGNOSIS — R202 Paresthesia of skin: Secondary | ICD-10-CM

## 2021-07-14 DIAGNOSIS — M25561 Pain in right knee: Secondary | ICD-10-CM | POA: Diagnosis not present

## 2021-07-14 DIAGNOSIS — M542 Cervicalgia: Secondary | ICD-10-CM | POA: Diagnosis not present

## 2021-07-14 DIAGNOSIS — M545 Low back pain, unspecified: Secondary | ICD-10-CM | POA: Diagnosis not present

## 2021-07-14 NOTE — Progress Notes (Signed)
Tingling in right upper arm, elbow, upper forearm- anterior. Symptoms present for several months.  Right hand dominant No lotion per patient

## 2021-07-15 NOTE — Procedures (Signed)
EMG & NCV Findings: Evaluation of the right median (across palm) sensory nerve showed prolonged distal peak latency (Wrist, 4.3 ms) and prolonged distal peak latency (Palm, 2.3 ms).  All remaining nerves (as indicated in the following tables) were within normal limits.    All examined muscles (as indicated in the following table) showed no evidence of electrical instability.    Impression: The above electrodiagnostic study is ABNORMAL and reveals evidence of a mild to borderline moderate right median nerve entrapment at the wrist (carpal tunnel syndrome) affecting sensory components.  This represents continued improvement since the last electrodiagnostic study and significant improvement since the presurgical electrodiagnostic study.    There is no significant electrodiagnostic evidence of any other focal nerve entrapment(in particular the radial nerve and radial nerve motor studies were performed), brachial plexopathy or cervical radiculopathy.   Recommendations: 1.  Follow-up with referring physician. 2.  Continue current management of symptoms.  ___________________________ Laurence Spates FAAPMR Board Certified, American Board of Physical Medicine and Rehabilitation    Nerve Conduction Studies Anti Sensory Summary Table   Stim Site NR Peak (ms) Norm Peak (ms) P-T Amp (V) Norm P-T Amp Site1 Site2 Delta-P (ms) Dist (cm) Vel (m/s) Norm Vel (m/s)  Right Median Acr Palm Anti Sensory (2nd Digit)  31.3C  Wrist    *4.3 <3.6 19.6 >10 Wrist Palm 2.0 0.0    Palm    *2.3 <2.0 4.3         Right Radial Anti Sensory (Base 1st Digit)  30.9C  Wrist    2.2 <3.1 29.3  Wrist Base 1st Digit 2.2 0.0    Right Ulnar Anti Sensory (5th Digit)  31.4C  Wrist    3.1 <3.7 23.9 >15.0 Wrist 5th Digit 3.1 14.0 45 >38   Motor Summary Table   Stim Site NR Onset (ms) Norm Onset (ms) O-P Amp (mV) Norm O-P Amp Site1 Site2 Delta-0 (ms) Dist (cm) Vel (m/s) Norm Vel (m/s)  Right Median Motor (Abd Poll Brev)  30.9C   Wrist    4.2 <4.2 5.4 >5 Elbow Wrist 4.9 24.5 50 >50  Elbow    9.1  4.9         Right Radial Motor (Ext Indicis)  29C  8cm    2.3 <2.5 2.2 >1.7 Up Arm 8cm 3.9 24.0 62 >60  Up Arm    6.2  3.8         Right Ulnar Motor (Abd Dig Min)  30.9C  Wrist    2.8 <4.2 10.3 >3 B Elbow Wrist 3.5 22.5 64 >53  B Elbow    6.3  10.7  A Elbow B Elbow 1.8 10.0 56 >53  A Elbow    8.1  10.2          EMG   Side Muscle Nerve Root Ins Act Fibs Psw Amp Dur Poly Recrt Int Fraser Din Comment  Right 1stDorInt Ulnar C8-T1 Nml Nml Nml Nml Nml 0 Nml Nml   Right ExtIndicis Radial (Post Int) C7-8 Nml Nml Nml Nml Nml 0 Nml Nml   Right BrachioRad Radial C5-6 Nml Nml Nml Nml Nml 0 Nml Nml   Right Triceps Radial C6-7-8 Nml Nml Nml Nml Nml 0 Nml Nml   Right Deltoid Axillary C5-6 Nml Nml Nml Nml Nml 0 Nml Nml   Right Ext Digitorum  Radial (Post Int) C7-8 Nml Nml Nml Nml Nml 0 Nml Nml     Nerve Conduction Studies Anti Sensory Left/Right Comparison   Stim Site L Lat (ms)  R Lat (ms) L-R Lat (ms) L Amp (V) R Amp (V) L-R Amp (%) Site1 Site2 L Vel (m/s) R Vel (m/s) L-R Vel (m/s)  Median Acr Palm Anti Sensory (2nd Digit)  31.3C  Wrist  *4.3   19.6  Wrist Palm     Palm  *2.3   4.3        Radial Anti Sensory (Base 1st Digit)  30.9C  Wrist  2.2   29.3  Wrist Base 1st Digit     Ulnar Anti Sensory (5th Digit)  31.4C  Wrist  3.1   23.9  Wrist 5th Digit  45    Motor Left/Right Comparison   Stim Site L Lat (ms) R Lat (ms) L-R Lat (ms) L Amp (mV) R Amp (mV) L-R Amp (%) Site1 Site2 L Vel (m/s) R Vel (m/s) L-R Vel (m/s)  Median Motor (Abd Poll Brev)  30.9C  Wrist  4.2   5.4  Elbow Wrist  50   Elbow  9.1   4.9        Radial Motor (Ext Indicis)  29C  8cm  2.3   2.2  Up Arm 8cm  62   Up Arm  6.2   3.8        Ulnar Motor (Abd Dig Min)  30.9C  Wrist  2.8   10.3  B Elbow Wrist  64   B Elbow  6.3   10.7  A Elbow B Elbow  56   A Elbow  8.1   10.2           Waveforms:

## 2021-07-15 NOTE — Progress Notes (Signed)
Jorge Turner - 65 y.o. male MRN [id number]  Date of birth: Oct 14, 1956  Office Visit Note: Visit Date: 07/14/2021 PCP: [name], MD Referred by: [name], MD  Subjective: Chief Complaint  Patient presents with   Right Arm - Numbness   HPI:  Jorge Turner is a 65 y.o. male who comes in today at the request of Dr. Basil Dess for electrodiagnostic study of the Right upper extremities.  Patient is Right hand dominant.  Patient continues to report tingling numbness sensation in the right upper arm anteriorly from the bicep through the elbow and upper forearm.  No real symptoms in the hands.  This been ongoing for many months now.  His history with shows pretty significant and that we have completed several electrodiagnostic studies and those are listed in the chart below.  He initially had pretty severe carpal tunnel/median nerve neuropathy at the wrist in 2019.  He underwent open carpal tunnel release by Dr. Louanne Skye just after that.  He continued to have symptoms and repeat electrodiagnostic study still showed continued moderate median neuropathy despite the surgery but he did have improvements in symptoms at the time.  He continued to have this symptoms in the arm in general.  None of the electrodiagnostic studies at that time showed any radiculopathy or other nerve compression or entrapment.  In August 2021 he underwent radial nerve resection and radial carpectomy by Dr. [name].  I am not sure if any other electrodiagnostic study was performed around that time at a different institution.  Dr. Louanne Skye requested repeat upper extremity right electrodiagnostic study looking for potential radial tunnel issues versus C7 or C6 radiculopathy.   ROS Otherwise per HPI.  Assessment & Plan: Visit Diagnoses:    ICD-10-CM   1. Paresthesia of skin  R20.2 NCV with EMG (electromyography)      Plan: Impression: The above electrodiagnostic study is ABNORMAL and reveals evidence of a mild to  borderline moderate right median nerve entrapment at the wrist (carpal tunnel syndrome) affecting sensory components.  This represents continued improvement since the last electrodiagnostic study and significant improvement since the presurgical electrodiagnostic study.    There is no significant electrodiagnostic evidence of any other focal nerve entrapment(in particular the radial nerve and radial nerve motor studies were performed), brachial plexopathy or cervical radiculopathy.   Recommendations: 1.  Follow-up with referring physician. 2.  Continue current management of symptoms.  Meds & Orders: No orders of the defined types were placed in this encounter.   Orders Placed This Encounter  Procedures   NCV with EMG (electromyography)    Follow-up: Return in about 2 weeks (around 07/28/2021) for Basil Dess, MD.   Procedures: No procedures performed  EMG & NCV Findings: Evaluation of the right median (across palm) sensory nerve showed prolonged distal peak latency (Wrist, 4.3 ms) and prolonged distal peak latency (Palm, 2.3 ms).  All remaining nerves (as indicated in the following tables) were within normal limits.    All examined muscles (as indicated in the following table) showed no evidence of electrical instability.    Impression: The above electrodiagnostic study is ABNORMAL and reveals evidence of a mild to borderline moderate right median nerve entrapment at the wrist (carpal tunnel syndrome) affecting sensory components.  This represents continued improvement since the last electrodiagnostic study and significant improvement since the presurgical electrodiagnostic study.    There is no significant electrodiagnostic evidence of any other focal nerve entrapment(in particular the radial nerve and radial nerve  motor studies were performed), brachial plexopathy or cervical radiculopathy.   Recommendations: 1.  Follow-up with referring physician. 2.  Continue current management of  symptoms.  ___________________________ Laurence Spates FAAPMR Board Certified, American Board of Physical Medicine and Rehabilitation    Nerve Conduction Studies Anti Sensory Summary Table   Stim Site NR Peak (ms) Norm Peak (ms) P-T Amp (V) Norm P-T Amp Site1 Site2 Delta-P (ms) Dist (cm) Vel (m/s) Norm Vel (m/s)  Right Median Acr Palm Anti Sensory (2nd Digit)  31.3C  Wrist    *4.3 <3.6 19.6 >10 Wrist Palm 2.0 0.0    Palm    *2.3 <2.0 4.3         Right Radial Anti Sensory (Base 1st Digit)  30.9C  Wrist    2.2 <3.1 29.3  Wrist Base 1st Digit 2.2 0.0    Right Ulnar Anti Sensory (5th Digit)  31.4C  Wrist    3.1 <3.7 23.9 >15.0 Wrist 5th Digit 3.1 14.0 45 >38   Motor Summary Table   Stim Site NR Onset (ms) Norm Onset (ms) O-P Amp (mV) Norm O-P Amp Site1 Site2 Delta-0 (ms) Dist (cm) Vel (m/s) Norm Vel (m/s)  Right Median Motor (Abd Poll Brev)  30.9C  Wrist    4.2 <4.2 5.4 >5 Elbow Wrist 4.9 24.5 50 >50  Elbow    9.1  4.9         Right Radial Motor (Ext Indicis)  29C  8cm    2.3 <2.5 2.2 >1.7 Up Arm 8cm 3.9 24.0 62 >60  Up Arm    6.2  3.8         Right Ulnar Motor (Abd Dig Min)  30.9C  Wrist    2.8 <4.2 10.3 >3 B Elbow Wrist 3.5 22.5 64 >53  B Elbow    6.3  10.7  A Elbow B Elbow 1.8 10.0 56 >53  A Elbow    8.1  10.2          EMG   Side Muscle Nerve Root Ins Act Fibs Psw Amp Dur Poly Recrt Int Fraser Din Comment  Right 1stDorInt Ulnar C8-T1 Nml Nml Nml Nml Nml 0 Nml Nml   Right ExtIndicis Radial (Post Int) C7-8 Nml Nml Nml Nml Nml 0 Nml Nml   Right BrachioRad Radial C5-6 Nml Nml Nml Nml Nml 0 Nml Nml   Right Triceps Radial C6-7-8 Nml Nml Nml Nml Nml 0 Nml Nml   Right Deltoid Axillary C5-6 Nml Nml Nml Nml Nml 0 Nml Nml   Right Ext Digitorum  Radial (Post Int) C7-8 Nml Nml Nml Nml Nml 0 Nml Nml     Nerve Conduction Studies Anti Sensory Left/Right Comparison   Stim Site L Lat (ms) R Lat (ms) L-R Lat (ms) L Amp (V) R Amp (V) L-R Amp (%) Site1 Site2 L Vel (m/s) R Vel (m/s) L-R Vel  (m/s)  Median Acr Palm Anti Sensory (2nd Digit)  31.3C  Wrist  *4.3   19.6  Wrist Palm     Palm  *2.3   4.3        Radial Anti Sensory (Base 1st Digit)  30.9C  Wrist  2.2   29.3  Wrist Base 1st Digit     Ulnar Anti Sensory (5th Digit)  31.4C  Wrist  3.1   23.9  Wrist 5th Digit  45    Motor Left/Right Comparison   Stim Site L Lat (ms) R Lat (ms) L-R Lat (ms) L Amp (mV) R Amp (mV)  L-R Amp (%) Site1 Site2 L Vel (m/s) R Vel (m/s) L-R Vel (m/s)  Median Motor (Abd Poll Brev)  30.9C  Wrist  4.2   5.4  Elbow Wrist  50   Elbow  9.1   4.9        Radial Motor (Ext Indicis)  29C  8cm  2.3   2.2  Up Arm 8cm  62   Up Arm  6.2   3.8        Ulnar Motor (Abd Dig Min)  30.9C  Wrist  2.8   10.3  B Elbow Wrist  64   B Elbow  6.3   10.7  A Elbow B Elbow  56   A Elbow  8.1   10.2           Waveforms:             Clinical History: 01/20/2020 Impression: The above electrodiagnostic study is ABNORMAL and reveals evidence of a moderate right median nerve entrapment at the wrist (carpal tunnel syndrome) affecting sensory and motor components.  This represents mild improvement from the last electrodiagnostic study in 2019 which was free carpal tunnel release.  This would represent residual nerve pathology based on his clinical course of complaints.     There is no significant electrodiagnostic evidence of any other focal nerve entrapment, brachial plexopathy or cervical radiculopathy. **In particular there were no findings for the left upper extremity either on nerve conductions or needle EMG.  As you know, this particular electrodiagnostic study cannot rule out chemical radiculitis or sensory only radiculopathy.     Recommendations: 1.  Follow-up with referring physician. 2.  Continue current management of symptoms.  Consider diagnostic carpal tunnel injection on the right.   ___________________________ Wonda Olds ------ 02/01/2018 electrodiagnostic study  Impression:  The above  electrodiagnostic study is ABNORMAL and reveals evidence of a moderate to severe right median nerve entrapment at the wrist (carpal tunnel syndrome) affecting sensory and motor components.     There is no significant electrodiagnostic evidence of any other focal nerve entrapment, brachial plexopathy or cervical radiculopathy.  Laurence Spates, MD     --------------------------------------------------------------  03/22/2017 electrodiagnostic study [facility]   NCV & EMG Findings:  Extensive electrodiagnostic testing of the right upper extremity shows:  1. Right median sensory response shows prolonged distal peak latency (5.2 ms). Right ulnar sensory response is within normal limits.  2. Right median motor response shows prolonged distal onset latency (5.2 ms). Right ulnar motor response shows decreased conduction velocity (A Elbow-B Elbow, 40 m/s), with normal latency and amplitude.  3. Sparse chronic motor axon loss changes are seen in the right first dorsal interosseous, abductor indicis brevis, and flexor carpi ulnaris muscles. There is no evidence of active denervation.     Impression:  1. Right median neuropathy at or distal to the wrist, consistent with the clinical diagnosis of carpal tunnel syndrome; moderate in degree electrically.  2. Right ulnar neuropathy with slowing across the elbow, purely demyelinating in type; mild in degree electrically.  3. There is no evidence of a right cervical radiculopathy.        ___________________________  Narda Amber, DO     Objective:  VS:  HT:    WT:   BMI:     BP:   HR: bpm  TEMP: ( )  RESP:  Physical Exam Musculoskeletal:        General: No tenderness.     Comments: Inspection reveals no atrophy  of the bilateral APB or FDI or hand intrinsics.  There are well-healed surgical scar.  There is no swelling, color changes, allodynia or dystrophic changes. There is 5 out of 5 strength in the bilateral wrist extension, finger abduction  and long finger flexion. There is intact sensation to light touch in all dermatomal and peripheral nerve distributions.  There is a negative Hoffmann's test bilaterally.  Skin:    General: Skin is warm and dry.     Findings: No erythema or rash.  Neurological:     General: No focal deficit present.     Mental Status: He is alert and oriented to person, place, and time.     Sensory: No sensory deficit.     Motor: No weakness or abnormal muscle tone.     Coordination: Coordination normal.     Gait: Gait normal.  Psychiatric:        Mood and Affect: Mood normal.        Behavior: Behavior normal.        Thought Content: Thought content normal.     Imaging: No results found.

## 2021-07-21 DIAGNOSIS — M542 Cervicalgia: Secondary | ICD-10-CM | POA: Diagnosis not present

## 2021-07-21 DIAGNOSIS — M19031 Primary osteoarthritis, right wrist: Secondary | ICD-10-CM | POA: Diagnosis not present

## 2021-07-21 DIAGNOSIS — M545 Low back pain, unspecified: Secondary | ICD-10-CM | POA: Diagnosis not present

## 2021-07-21 DIAGNOSIS — M25561 Pain in right knee: Secondary | ICD-10-CM | POA: Diagnosis not present

## 2021-07-28 DIAGNOSIS — M545 Low back pain, unspecified: Secondary | ICD-10-CM | POA: Diagnosis not present

## 2021-07-28 DIAGNOSIS — M25561 Pain in right knee: Secondary | ICD-10-CM | POA: Diagnosis not present

## 2021-07-28 DIAGNOSIS — M19031 Primary osteoarthritis, right wrist: Secondary | ICD-10-CM | POA: Diagnosis not present

## 2021-07-28 DIAGNOSIS — M542 Cervicalgia: Secondary | ICD-10-CM | POA: Diagnosis not present

## 2021-08-04 DIAGNOSIS — M542 Cervicalgia: Secondary | ICD-10-CM | POA: Diagnosis not present

## 2021-08-04 DIAGNOSIS — M545 Low back pain, unspecified: Secondary | ICD-10-CM | POA: Diagnosis not present

## 2021-08-04 DIAGNOSIS — M25561 Pain in right knee: Secondary | ICD-10-CM | POA: Diagnosis not present

## 2021-08-04 DIAGNOSIS — M19031 Primary osteoarthritis, right wrist: Secondary | ICD-10-CM | POA: Diagnosis not present

## 2021-08-05 ENCOUNTER — Ambulatory Visit: Payer: Federal, State, Local not specified - PPO | Admitting: Specialist

## 2021-08-05 ENCOUNTER — Encounter: Payer: Self-pay | Admitting: Specialist

## 2021-08-05 ENCOUNTER — Other Ambulatory Visit: Payer: Self-pay

## 2021-08-05 VITALS — BP 117/75 | HR 60 | Ht 70.0 in | Wt 164.0 lb

## 2021-08-05 DIAGNOSIS — Z9889 Other specified postprocedural states: Secondary | ICD-10-CM

## 2021-08-05 DIAGNOSIS — R202 Paresthesia of skin: Secondary | ICD-10-CM

## 2021-08-05 DIAGNOSIS — G5601 Carpal tunnel syndrome, right upper limb: Secondary | ICD-10-CM

## 2021-08-05 DIAGNOSIS — R200 Anesthesia of skin: Secondary | ICD-10-CM

## 2021-08-05 DIAGNOSIS — M25531 Pain in right wrist: Secondary | ICD-10-CM
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
## 2021-08-06 DIAGNOSIS — K219 Gastro-esophageal reflux disease without esophagitis: Secondary | ICD-10-CM | POA: Diagnosis not present

## 2021-08-06 DIAGNOSIS — R7303 Prediabetes: Secondary | ICD-10-CM | POA: Diagnosis not present

## 2021-08-06 DIAGNOSIS — Z79899 Other long term (current) drug therapy: Secondary | ICD-10-CM | POA: Diagnosis not present

## 2021-08-06 DIAGNOSIS — E785 Hyperlipidemia, unspecified: Secondary | ICD-10-CM | POA: Diagnosis not present

## 2021-08-24 DIAGNOSIS — R972 Elevated prostate specific antigen [PSA]: Secondary | ICD-10-CM | POA: Diagnosis not present

## 2021-08-24 DIAGNOSIS — R2231 Localized swelling, mass and lump, right upper limb: Secondary | ICD-10-CM | POA: Diagnosis not present

## 2021-08-25 DIAGNOSIS — M542 Cervicalgia: Secondary | ICD-10-CM | POA: Diagnosis not present

## 2021-08-25 DIAGNOSIS — M25561 Pain in right knee: Secondary | ICD-10-CM | POA: Diagnosis not present

## 2021-08-25 DIAGNOSIS — M545 Low back pain, unspecified: Secondary | ICD-10-CM | POA: Diagnosis not present

## 2021-08-25 DIAGNOSIS — M19031 Primary osteoarthritis, right wrist: Secondary | ICD-10-CM | POA: Diagnosis not present

## 2021-08-31 DIAGNOSIS — R2231 Localized swelling, mass and lump, right upper limb: Secondary | ICD-10-CM | POA: Diagnosis not present

## 2021-08-31 DIAGNOSIS — R2241 Localized swelling, mass and lump, right lower limb: Secondary | ICD-10-CM | POA: Diagnosis not present

## 2021-09-01 DIAGNOSIS — M545 Low back pain, unspecified: Secondary | ICD-10-CM | POA: Diagnosis not present

## 2021-09-01 DIAGNOSIS — M19031 Primary osteoarthritis, right wrist: Secondary | ICD-10-CM | POA: Diagnosis not present

## 2021-09-01 DIAGNOSIS — M25561 Pain in right knee: Secondary | ICD-10-CM | POA: Diagnosis not present

## 2021-09-01 DIAGNOSIS — R972 Elevated prostate specific antigen [PSA]: Secondary | ICD-10-CM | POA: Diagnosis not present

## 2021-09-01 DIAGNOSIS — M542 Cervicalgia: Secondary | ICD-10-CM | POA: Diagnosis not present

## 2021-09-01 DIAGNOSIS — N5201 Erectile dysfunction due to arterial insufficiency: Secondary | ICD-10-CM | POA: Diagnosis not present

## 2021-09-01 DIAGNOSIS — R350 Frequency of micturition: Secondary | ICD-10-CM | POA: Diagnosis not present

## 2021-09-02 ENCOUNTER — Ambulatory Visit (INDEPENDENT_AMBULATORY_CARE_PROVIDER_SITE_OTHER): Payer: Federal, State, Local not specified - PPO | Admitting: Internal Medicine

## 2021-09-02 ENCOUNTER — Other Ambulatory Visit: Payer: Self-pay

## 2021-09-02 ENCOUNTER — Encounter: Payer: Self-pay | Admitting: Internal Medicine

## 2021-09-02 VITALS — BP 112/70 | HR 59 | Temp 98.6°F | Ht 70.0 in | Wt 162.2 lb

## 2021-09-02 DIAGNOSIS — R300 Dysuria: Secondary | ICD-10-CM

## 2021-09-02 DIAGNOSIS — R053 Chronic cough: Secondary | ICD-10-CM | POA: Diagnosis not present

## 2021-09-02 DIAGNOSIS — Z0000 Encounter for general adult medical examination without abnormal findings: Secondary | ICD-10-CM

## 2021-09-02 DIAGNOSIS — E538 Deficiency of other specified B group vitamins: Secondary | ICD-10-CM

## 2021-09-02 DIAGNOSIS — Z23 Encounter for immunization: Secondary | ICD-10-CM | POA: Diagnosis not present

## 2021-09-02 DIAGNOSIS — R972 Elevated prostate specific antigen [PSA]: Secondary | ICD-10-CM | POA: Diagnosis not present

## 2021-09-02 DIAGNOSIS — E785 Hyperlipidemia, unspecified: Secondary | ICD-10-CM

## 2021-09-02 MED ORDER — SILDENAFIL CITRATE 100 MG PO TABS: 50.0000 mg | ORAL_TABLET | Freq: Every day | ORAL | 5 refills | Status: DC | PRN

## 2021-09-02 MED ORDER — PROMETHAZINE-CODEINE 6.25-10 MG/5ML PO SYRP: 5.0000 mL | ORAL_SOLUTION | ORAL | 0 refills | Status: DC | PRN

## 2021-09-02 MED ORDER — IBUPROFEN-FAMOTIDINE 800-26.6 MG PO TABS: ORAL_TABLET | ORAL | 2 refills | Status: DC

## 2021-09-02 NOTE — Assessment & Plan Note (Signed)
On B12 

## 2021-09-02 NOTE — Assessment & Plan Note (Signed)
PSA 6 Seeing Dr Alinda Money Poss MRI if elevated

## 2021-09-02 NOTE — Patient Instructions (Addendum)
EXAM: ULTRASOUND RIGHT UPPER EXTREMITY LIMITED  TECHNIQUE: Ultrasound examination of the upper extremity soft tissues was performed in the area of clinical concern.  COMPARISON:  None.  FINDINGS: Sonographic imaging was targeted to the right ring and fifth fingers, interrogating the clinically apparent masses.  The fifth finger demonstrates a hypoechoic, oval mass, adjacent to the D IP joint, abutting the underlying bony cortex, measuring 8 x 4 x 5 mm.  The ring finger mass lies just superficial to the D IP joint, also hypoechoic and oval, measuring 1.1 x 0.5 x 0.9 cm.  Both soft tissue masses demonstrate ill-defined margins.  On color Doppler analysis lesions show internal vascularity.  IMPRESSION: 1. Hypoechoic soft tissue masses overlying the DIP joints of the right ring and small fingers. 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   Electronically Signed  By: Lajean Manes M.D.  On: 09/01/2021 09:12  Cardiac CT calcium scoring test $99 Tel # is [phone]   Computed tomography, more commonly known as a CT or CAT scan, is a diagnostic medical imaging test. Like traditional x-rays, it produces multiple images or pictures of the inside of the body. The cross-sectional images generated during a CT scan can be reformatted in multiple planes. They can even generate three-dimensional images. These images can be viewed on a computer monitor, printed on film or by a 3D printer, or transferred to a CD or DVD. CT images of internal organs, bones, soft tissue and blood vessels provide greater detail than traditional x-rays, particularly of soft tissues and blood vessels. A cardiac CT scan for coronary calcium is a non-invasive way of obtaining information about the presence, location and extent of calcified plaque in the coronary arteries--the vessels that supply oxygen-containing blood to the heart muscle.  Calcified plaque results when there is a build-up of fat and other substances under the inner layer of the artery. This material can calcify which signals the presence of atherosclerosis, a disease of the vessel wall, also called coronary artery disease (CAD). People with this disease have an increased risk for heart attacks. In addition, over time, progression of plaque build up (CAD) can narrow the arteries or even close off blood flow to the heart. The result may be chest pain, sometimes called "angina," or a heart attack. Because calcium is a marker of CAD, the amount of calcium detected on a cardiac CT scan is a helpful prognostic tool. The findings on cardiac CT are expressed as a calcium score. Another name for this test is coronary artery calcium scoring.  What are some common uses of the procedure? The goal of cardiac CT scan for calcium scoring is to determine if CAD is present and to what extent, even if there are no symptoms. It is a screening study that may be recommended by a physician for patients with risk factors for CAD but no clinical symptoms. The major risk factors for CAD are: high blood cholesterol levels  family history of heart attacks  diabetes  high blood pressure  cigarette smoking  overweight or obese  physical inactivity   A negative cardiac CT scan for calcium scoring shows no calcification within the coronary arteries. This suggests that CAD is absent or so minimal it cannot be seen by this technique. The chance of having a heart attack over the next two to five years is very low under these circumstances. A positive test means that CAD is present, regardless of  whether or not the patient is experiencing any symptoms. The amount of calcification--expressed as the calcium score--may help to predict the likelihood of a myocardial infarction (heart attack) in the coming years and helps your medical doctor or cardiologist decide whether the patient may need to take  preventive medicine or undertake other measures such as diet and exercise to lower the risk for heart attack. The extent of CAD is graded according to your calcium score:  Calcium Score  Presence of CAD (coronary artery disease)  0 No evidence of CAD   1-10 Minimal evidence of CAD  11-100 Mild evidence of CAD  101-400 Moderate evidence of CAD  Over 400 Extensive evidence of CAD   Coronary artery calcium (CAC) score is a strong predictor of incident coronary heart disease (CHD) and provides predictive information beyond traditional risk factors. CAC scoring is reasonable to use in the decision to withhold, postpone, or initiate statin therapy in intermediate-risk or selected borderline-risk asymptomatic adults (age 74-75 years and LDL-C >=70 to <190 mg/dL) who do not have diabetes or established atherosclerotic cardiovascular disease (ASCVD).* In intermediate-risk (10-year ASCVD risk >=7.5% to <20%) adults or selected borderline-risk (10-year ASCVD risk >=5% to <7.5%) adults in whom a CAC score is measured for the purpose of making a treatment decision the following recommendations have been made:   If CAC=0, it is reasonable to withhold statin therapy and reassess in 5 to 10 years, as long as higher risk conditions are absent (diabetes mellitus, family history of premature CHD in first degree relatives (males <55 years; females <65 years), cigarette smoking, or LDL >=190 mg/dL).   If CAC is 1 to 99, it is reasonable to initiate statin therapy for patients >=25 years of age.   If CAC is >=100 or >=75th percentile, it is reasonable to initiate statin therapy at any age.   Cardiology referral should be considered for patients with CAC scores >=400 or >=75th percentile.   *2018 AHA/ACC/AACVPR/AAPA/ABC/ACPM/ADA/AGS/APhA/ASPC/NLA/PCNA Guideline on the Management of Blood Cholesterol: A Report of the American College of Cardiology/American Heart Association Task Force on Clinical  Practice Guidelines. J Am Coll Cardiol. 2019;73(24):3168-3209.

## 2021-09-02 NOTE — Assessment & Plan Note (Signed)
  We discussed age appropriate health related issues, including available/recomended screening tests and vaccinations. Labs were ordered to be later reviewed . All questions were answered. We discussed one or more of the following - seat belt use, use of sunscreen/sun exposure exercise, fall risk reduction, second hand smoke exposure, firearm use and storage, seat belt use, a need for adhering to healthy diet and exercise. Labs were ordered.  All questions were answered. Cor calcium CT offered Colon 2018 Dr Henrene Pastor. Due in 2023

## 2021-09-02 NOTE — Progress Notes (Signed)
Subjective:  Patient ID: Jorge Turner, male    DOB: [birth year]  Age: 65 y.o. MRN: [id number]  CC: Annual Exam   HPI DEMITRIS POKORNY presents for a well exam Mom is 36 - very sick F/u on elevated PSA, dyslipidemia C/o ED C/o cough - dry cough for weeks.  No chest pain or shortness of breath.  No wheezing  Outpatient Medications Prior to Visit  Medication Sig Dispense Refill   cholecalciferol (VITAMIN D) 1000 UNITS tablet Take 1,000 Units by mouth daily.     Cyanocobalamin (VITAMIN B-12) 1000 MCG SUBL Place 1 tablet (1,000 mcg total) under the tongue daily. 100 tablet 3   diclofenac Sodium (VOLTAREN) 1 % GEL APPLY 4 G TOPICALLY 4 TIMES DAILY 300 g 5   finasteride (PROSCAR) 5 MG tablet TAKE 1 TABLET BY MOUTH EVERY DAY 90 tablet 3   loratadine (CLARITIN) 10 MG tablet TAKE 1 TABLET BY MOUTH EVERY DAY 90 tablet 3   omeprazole (PRILOSEC) 40 MG capsule TAKE 1 CAPSULE BY MOUTH EVERY DAY 90 capsule 3   tamsulosin (FLOMAX) 0.4 MG CAPS capsule TAKE 1 CAPSULE BY MOUTH EVERY DAY 90 capsule 3   triamcinolone cream (KENALOG) 0.5 % Apply 1 application topically 3 (three) times daily. 45 g 1   valACYclovir (VALTREX) 1000 MG tablet Take 2t po q12h times 2 doses. Repeat with next fever blister episode. 20 tablet 3   diclofenac sodium (VOLTAREN) 1 % GEL APPLY 2 GRAMS TO AFFECTED AREA 4 TIMES A DAY 300 g 3   meloxicam (MOBIC) 15 MG tablet Take 1 tablet (15 mg total) by mouth daily as needed for pain. (Patient not taking: Reported on 09/02/2021) 30 tablet 1   No facility-administered medications prior to visit.    ROS: Review of Systems  Constitutional:  Negative for appetite change, fatigue and unexpected weight change.  HENT:  Negative for congestion, nosebleeds, sneezing, sore throat and trouble swallowing.   Eyes:  Negative for itching and visual disturbance.  Respiratory:  Positive for cough.   Cardiovascular:  Negative for chest pain, palpitations and leg swelling.  Gastrointestinal:  Negative for  abdominal distention, blood in stool, diarrhea and nausea.  Genitourinary:  Negative for frequency and hematuria.  Musculoskeletal:  Positive for arthralgias, back pain, neck pain and neck stiffness. Negative for gait problem and joint swelling.  Skin:  Negative for rash.  Neurological:  Negative for dizziness, tremors, speech difficulty and weakness.  Psychiatric/Behavioral:  Negative for agitation, dysphoric mood, sleep disturbance and suicidal ideas. The patient is not nervous/anxious.    Objective:  BP 112/70 (BP Location: Left Arm)   Pulse (!) 59   Temp 98.6 F (37 C) (Oral)   Ht 5\' 10"  (1.778 m)   Wt 162 lb 3.2 oz (73.6 kg)   SpO2 95%   BMI 23.27 kg/m   BP Readings from Last 3 Encounters:  09/02/21 112/70  08/05/21 117/75  05/21/21 120/81    Wt Readings from Last 3 Encounters:  09/02/21 162 lb 3.2 oz (73.6 kg)  08/05/21 164 lb (74.4 kg)  05/21/21 164 lb (74.4 kg)    Physical Exam Constitutional:      General: He is not in acute distress.    Appearance: He is well-developed.     Comments: NAD  Eyes:     Conjunctiva/sclera: Conjunctivae normal.     Pupils: Pupils are equal, round, and reactive to light.  Neck:     Thyroid: No thyromegaly.     Vascular: No  JVD.  Cardiovascular:     Rate and Rhythm: Normal rate and regular rhythm.     Heart sounds: Normal heart sounds. No murmur heard.   No friction rub. No gallop.  Pulmonary:     Effort: Pulmonary effort is normal. No respiratory distress.     Breath sounds: Normal breath sounds. No wheezing or rales.  Chest:     Chest wall: No tenderness.  Abdominal:     General: Bowel sounds are normal. There is no distension.     Palpations: Abdomen is soft. There is no mass.     Tenderness: There is no abdominal tenderness. There is no guarding or rebound.  Musculoskeletal:        General: No tenderness. Normal range of motion.     Cervical back: Normal range of motion.  Lymphadenopathy:     Cervical: No cervical  adenopathy.  Skin:    General: Skin is warm and dry.     Findings: No rash.  Neurological:     Mental Status: He is alert and oriented to person, place, and time.     Cranial Nerves: No cranial nerve deficit.     Motor: No abnormal muscle tone.     Coordination: Coordination normal.     Gait: Gait normal.     Deep Tendon Reflexes: Reflexes are normal and symmetric.  Psychiatric:        Behavior: Behavior normal.        Thought Content: Thought content normal.        Judgment: Judgment normal.   I spent 22 minutes in addition to time for CPX wellness examination in preparing to see the patient by review of recent labs, imaging and procedures, obtaining and reviewing separately obtained history, communicating with the patient, ordering medications, tests or procedures, and documenting clinical information in the EHR including the differential diagnosis, treatment, and any further evaluation and other management of elevated PSA, ED, dyslipidemia, cough        Lab Results  Component Value Date   WBC 3.9 (L) 11/26/2019   HGB 15.4 11/26/2019   HCT 46.3 11/26/2019   PLT 265.0 11/26/2019   GLUCOSE 94 11/26/2019   CHOL 187 02/08/2018   TRIG 92.0 02/08/2018   HDL 55.50 02/08/2018   LDLDIRECT 140.3 10/26/2011   LDLCALC 113 (H) 02/08/2018   ALT 14 10/03/2018   AST 13 10/03/2018   NA 139 11/26/2019   K 3.7 11/26/2019   CL 104 11/26/2019   CREATININE 1.55 (H) 11/26/2019   BUN 23 11/26/2019   CO2 28 11/26/2019   TSH 3.94 02/08/2018   PSA 4.13 (H) 02/08/2018    MR SHOULDER RIGHT W CONTRAST  Result Date: 03/20/2021 CLINICAL DATA:  Right shoulder pain radiating into the elbow with weakness, numbness and tingling. Symptoms for several weeks. EXAM: MR ARTHROGRAM OF THE RIGHT SHOULDER TECHNIQUE: Multiplanar, multisequence MR imaging of the right shoulder was performed following the administration of intra-articular contrast. CONTRAST:  See Injection Documentation. COMPARISON:  Plain films  right shoulder 01/15/2021. FINDINGS: Rotator cuff: The patient has rotator cuff tendinopathy. There is a 1.1 cm from front to back tear of the anterior and far lateral supraspinatus. The tear is deep and articular sided with a very small full-thickness component at its leading edge. No retraction. Muscles: Normal without atrophy or focal lesion. Biceps long head: Intact. Acromioclavicular Joint: Moderate osteoarthritis. The acromion is type 2. A subacromial spur is seen off the anterior margin of the acromion. There is no contrast  in the subacromial/subdeltoid bursa but there is a small amount of fluid in the bursa. Glenohumeral Joint: Appears normal. Labrum: Intact. Bones: No fracture or focal lesion. IMPRESSION: 1.1 cm from front to back tear of the anterior and far lateral supraspinatus. The tear is mainly bursal sided with a small full-thickness component anteriorly. No retraction or atrophy. Moderate acromioclavicular osteoarthritis. Type 2 acromion with a subacromial spur off its anterior margin noted. Subacromial/subdeltoid fluid bursitis. Electronically Signed   By: Inge Rise M.D.   On: 03/20/2021 11:39   Arthrogram  Result Date: 03/19/2021 CLINICAL DATA:  Chronic right shoulder pain. FLUOROSCOPY TIME:  Radiation Exposure Index (as provided by the fluoroscopic device): 0.3 mGy Fluoroscopy Time:  10 seconds Number of Acquired Images:  0 PROCEDURE: The risks and benefits of the procedure were discussed with the patient, and written informed consent was obtained. The patient stated no history of allergy to contrast media. A formal timeout procedure was performed with the patient according to departmental protocol. The patient was placed supine on the fluoroscopy table and the right glenohumeral joint was identified under fluoroscopy. The skin overlying the right glenohumeral joint was subsequently cleaned with Betadine and a sterile drape was placed over the area of interest. 2 ml 1% Lidocaine was  used to anesthetize the skin around the needle insertion site. A 22 gauge spinal needle was inserted into the right glenohumeral joint under fluoroscopy. 12 ml of gadolinium mixture (0.1 ml of Multihance mixed with 15 ml of Isovue-M 200 contrast and 5 ml of sterile saline) were injected into the right glenohumeral joint. The needle was removed and hemostasis was achieved. The patient was subsequently transferred to MRI for imaging. IMPRESSION: Technically successful right shoulder injection for MRI. Electronically Signed   By: Titus Dubin M.D.   On: 03/19/2021 15:45    Assessment & Plan:   Problem List Items Addressed This Visit     B12 deficiency    On B12      Cough     Cor calcium CT vs CXR was offered Prom-cod syr       Dyslipidemia   Relevant Orders   CT CARDIAC SCORING (SELF PAY ONLY)   Dysuria    Seeing Dr Alinda Money      Elevated PSA    PSA 6 Seeing Dr Alinda Money Poss MRI if elevated      Well adult exam     We discussed age appropriate health related issues, including available/recomended screening tests and vaccinations. Labs were ordered to be later reviewed . All questions were answered. We discussed one or more of the following - seat belt use, use of sunscreen/sun exposure exercise, fall risk reduction, second hand smoke exposure, firearm use and storage, seat belt use, a need for adhering to healthy diet and exercise. Labs were ordered.  All questions were answered. Cor calcium CT offered Colon 2018 Dr Henrene Pastor. Due in 2023      Other Visit Diagnoses     Needs flu shot    -  Primary         Follow-up: Return in about 6 months (around 03/03/2022) for a follow-up visit.  Walker Kehr, MD

## 2021-09-02 NOTE — Assessment & Plan Note (Addendum)
  Cor calcium CT vs CXR was offered.  Coronary calcium CT ordered Prom-cod cough syrup prescribed

## 2021-09-02 NOTE — Assessment & Plan Note (Signed)
Seeing Dr Borden 

## 2021-09-04 DIAGNOSIS — M25561 Pain in right knee: Secondary | ICD-10-CM | POA: Diagnosis not present

## 2021-09-04 DIAGNOSIS — M545 Low back pain, unspecified: Secondary | ICD-10-CM | POA: Diagnosis not present

## 2021-09-04 DIAGNOSIS — M542 Cervicalgia: Secondary | ICD-10-CM | POA: Diagnosis not present

## 2021-09-04 DIAGNOSIS — M19031 Primary osteoarthritis, right wrist: Secondary | ICD-10-CM | POA: Diagnosis not present

## 2021-09-05 NOTE — Assessment & Plan Note (Signed)
Coronary calcium CT ordered Lipids ordered

## 2021-09-15 DIAGNOSIS — M542 Cervicalgia: Secondary | ICD-10-CM | POA: Diagnosis not present

## 2021-09-15 DIAGNOSIS — M19031 Primary osteoarthritis, right wrist: Secondary | ICD-10-CM | POA: Diagnosis not present

## 2021-09-15 DIAGNOSIS — M545 Low back pain, unspecified: Secondary | ICD-10-CM | POA: Diagnosis not present

## 2021-09-15 DIAGNOSIS — M25561 Pain in right knee: Secondary | ICD-10-CM | POA: Diagnosis not present

## 2021-09-22 DIAGNOSIS — M545 Low back pain, unspecified: Secondary | ICD-10-CM | POA: Diagnosis not present

## 2021-09-22 DIAGNOSIS — M19031 Primary osteoarthritis, right wrist: Secondary | ICD-10-CM | POA: Diagnosis not present

## 2021-09-22 DIAGNOSIS — M542 Cervicalgia: Secondary | ICD-10-CM | POA: Diagnosis not present

## 2021-09-22 DIAGNOSIS — M25561 Pain in right knee: Secondary | ICD-10-CM | POA: Diagnosis not present

## 2021-09-29 DIAGNOSIS — M542 Cervicalgia: Secondary | ICD-10-CM | POA: Diagnosis not present

## 2021-09-29 DIAGNOSIS — M19031 Primary osteoarthritis, right wrist: Secondary | ICD-10-CM | POA: Diagnosis not present

## 2021-09-29 DIAGNOSIS — M545 Low back pain, unspecified: Secondary | ICD-10-CM | POA: Diagnosis not present

## 2021-09-29 DIAGNOSIS — M25561 Pain in right knee: Secondary | ICD-10-CM | POA: Diagnosis not present

## 2021-10-06 DIAGNOSIS — M545 Low back pain, unspecified: Secondary | ICD-10-CM | POA: Diagnosis not present

## 2021-10-06 DIAGNOSIS — M542 Cervicalgia: Secondary | ICD-10-CM | POA: Diagnosis not present

## 2021-10-06 DIAGNOSIS — M25561 Pain in right knee: Secondary | ICD-10-CM | POA: Diagnosis not present

## 2021-10-06 DIAGNOSIS — M19031 Primary osteoarthritis, right wrist: Secondary | ICD-10-CM | POA: Diagnosis not present

## 2021-10-13 DIAGNOSIS — M545 Low back pain, unspecified: Secondary | ICD-10-CM | POA: Diagnosis not present

## 2021-10-13 DIAGNOSIS — M542 Cervicalgia: Secondary | ICD-10-CM | POA: Diagnosis not present

## 2021-10-13 DIAGNOSIS — M25561 Pain in right knee: Secondary | ICD-10-CM | POA: Diagnosis not present

## 2021-10-13 DIAGNOSIS — M19031 Primary osteoarthritis, right wrist: Secondary | ICD-10-CM | POA: Diagnosis not present

## 2021-10-20 DIAGNOSIS — M545 Low back pain, unspecified: Secondary | ICD-10-CM | POA: Diagnosis not present

## 2021-10-20 DIAGNOSIS — M25561 Pain in right knee: Secondary | ICD-10-CM | POA: Diagnosis not present

## 2021-10-20 DIAGNOSIS — M19031 Primary osteoarthritis, right wrist: Secondary | ICD-10-CM | POA: Diagnosis not present

## 2021-10-20 DIAGNOSIS — M542 Cervicalgia: Secondary | ICD-10-CM | POA: Diagnosis not present

## 2021-10-27 DIAGNOSIS — M542 Cervicalgia: Secondary | ICD-10-CM | POA: Diagnosis not present

## 2021-10-27 DIAGNOSIS — M19031 Primary osteoarthritis, right wrist: Secondary | ICD-10-CM | POA: Diagnosis not present

## 2021-10-27 DIAGNOSIS — M25561 Pain in right knee: Secondary | ICD-10-CM | POA: Diagnosis not present

## 2021-10-27 DIAGNOSIS — M545 Low back pain, unspecified: Secondary | ICD-10-CM | POA: Diagnosis not present

## 2021-11-03 DIAGNOSIS — M545 Low back pain, unspecified: Secondary | ICD-10-CM | POA: Diagnosis not present

## 2021-11-03 DIAGNOSIS — M25561 Pain in right knee: Secondary | ICD-10-CM | POA: Diagnosis not present

## 2021-11-03 DIAGNOSIS — M542 Cervicalgia: Secondary | ICD-10-CM | POA: Diagnosis not present

## 2021-11-03 DIAGNOSIS — M19031 Primary osteoarthritis, right wrist: Secondary | ICD-10-CM | POA: Diagnosis not present

## 2021-11-04 ENCOUNTER — Other Ambulatory Visit: Payer: Self-pay | Admitting: Internal Medicine

## 2021-11-05 DIAGNOSIS — U071 COVID-19: Secondary | ICD-10-CM | POA: Diagnosis not present

## 2021-11-05 DIAGNOSIS — R509 Fever, unspecified: Secondary | ICD-10-CM | POA: Diagnosis not present

## 2021-11-17 DIAGNOSIS — M19031 Primary osteoarthritis, right wrist: Secondary | ICD-10-CM | POA: Diagnosis not present

## 2021-11-17 DIAGNOSIS — M542 Cervicalgia: Secondary | ICD-10-CM | POA: Diagnosis not present

## 2021-11-17 DIAGNOSIS — M25561 Pain in right knee: Secondary | ICD-10-CM | POA: Diagnosis not present

## 2021-11-17 DIAGNOSIS — M545 Low back pain, unspecified: Secondary | ICD-10-CM | POA: Diagnosis not present

## 2021-11-27 DIAGNOSIS — M542 Cervicalgia: Secondary | ICD-10-CM | POA: Diagnosis not present

## 2021-11-27 DIAGNOSIS — M545 Low back pain, unspecified: Secondary | ICD-10-CM | POA: Diagnosis not present

## 2021-11-27 DIAGNOSIS — M19031 Primary osteoarthritis, right wrist: Secondary | ICD-10-CM | POA: Diagnosis not present

## 2021-11-27 DIAGNOSIS — M25561 Pain in right knee: Secondary | ICD-10-CM | POA: Diagnosis not present

## 2021-12-08 DIAGNOSIS — M25561 Pain in right knee: Secondary | ICD-10-CM | POA: Diagnosis not present

## 2021-12-08 DIAGNOSIS — M545 Low back pain, unspecified: Secondary | ICD-10-CM | POA: Diagnosis not present

## 2021-12-08 DIAGNOSIS — M542 Cervicalgia: Secondary | ICD-10-CM | POA: Diagnosis not present

## 2021-12-08 DIAGNOSIS — M19031 Primary osteoarthritis, right wrist: Secondary | ICD-10-CM | POA: Diagnosis not present

## 2022-01-11 ENCOUNTER — Other Ambulatory Visit: Payer: Self-pay | Admitting: Orthopedic Surgery

## 2022-01-11 DIAGNOSIS — Z841 Family history of disorders of kidney and ureter: Secondary | ICD-10-CM | POA: Diagnosis not present

## 2022-01-11 DIAGNOSIS — R202 Paresthesia of skin: Secondary | ICD-10-CM | POA: Diagnosis not present

## 2022-01-11 DIAGNOSIS — Z0189 Encounter for other specified special examinations: Secondary | ICD-10-CM | POA: Diagnosis not present

## 2022-01-11 DIAGNOSIS — S6991XA Unspecified injury of right wrist, hand and finger(s), initial encounter: Secondary | ICD-10-CM | POA: Diagnosis not present

## 2022-01-11 DIAGNOSIS — R2231 Localized swelling, mass and lump, right upper limb: Secondary | ICD-10-CM | POA: Diagnosis not present

## 2022-01-11 DIAGNOSIS — E785 Hyperlipidemia, unspecified: Secondary | ICD-10-CM | POA: Diagnosis not present

## 2022-01-11 DIAGNOSIS — R7303 Prediabetes: Secondary | ICD-10-CM | POA: Diagnosis not present

## 2022-01-13 DIAGNOSIS — Z881 Allergy status to other antibiotic agents status: Secondary | ICD-10-CM | POA: Diagnosis not present

## 2022-01-13 DIAGNOSIS — M47812 Spondylosis without myelopathy or radiculopathy, cervical region: Secondary | ICD-10-CM | POA: Diagnosis not present

## 2022-01-13 DIAGNOSIS — Z79899 Other long term (current) drug therapy: Secondary | ICD-10-CM | POA: Diagnosis not present

## 2022-01-13 DIAGNOSIS — S8992XA Unspecified injury of left lower leg, initial encounter: Secondary | ICD-10-CM | POA: Diagnosis not present

## 2022-01-13 DIAGNOSIS — Z888 Allergy status to other drugs, medicaments and biological substances status: Secondary | ICD-10-CM | POA: Diagnosis not present

## 2022-01-13 DIAGNOSIS — Y939 Activity, unspecified: Secondary | ICD-10-CM | POA: Diagnosis not present

## 2022-01-13 DIAGNOSIS — M542 Cervicalgia: Secondary | ICD-10-CM | POA: Diagnosis not present

## 2022-01-13 DIAGNOSIS — Z043 Encounter for examination and observation following other accident: Secondary | ICD-10-CM | POA: Diagnosis not present

## 2022-01-13 DIAGNOSIS — M25562 Pain in left knee: Secondary | ICD-10-CM | POA: Diagnosis not present

## 2022-01-19 DIAGNOSIS — M542 Cervicalgia: Secondary | ICD-10-CM | POA: Diagnosis not present

## 2022-01-19 DIAGNOSIS — M25561 Pain in right knee: Secondary | ICD-10-CM | POA: Diagnosis not present

## 2022-01-19 DIAGNOSIS — M19031 Primary osteoarthritis, right wrist: Secondary | ICD-10-CM | POA: Diagnosis not present

## 2022-01-19 DIAGNOSIS — M545 Low back pain, unspecified: Secondary | ICD-10-CM | POA: Diagnosis not present

## 2022-01-22 ENCOUNTER — Encounter (HOSPITAL_BASED_OUTPATIENT_CLINIC_OR_DEPARTMENT_OTHER): Payer: Self-pay | Admitting: Orthopedic Surgery

## 2022-01-22 ENCOUNTER — Other Ambulatory Visit: Payer: Self-pay

## 2022-01-26 DIAGNOSIS — M542 Cervicalgia: Secondary | ICD-10-CM | POA: Diagnosis not present

## 2022-01-26 DIAGNOSIS — M19031 Primary osteoarthritis, right wrist: Secondary | ICD-10-CM | POA: Diagnosis not present

## 2022-01-26 DIAGNOSIS — M25561 Pain in right knee: Secondary | ICD-10-CM | POA: Diagnosis not present

## 2022-01-26 DIAGNOSIS — M545 Low back pain, unspecified: Secondary | ICD-10-CM | POA: Diagnosis not present

## 2022-01-29 ENCOUNTER — Ambulatory Visit (HOSPITAL_BASED_OUTPATIENT_CLINIC_OR_DEPARTMENT_OTHER): Payer: Federal, State, Local not specified - PPO | Admitting: Anesthesiology

## 2022-01-29 ENCOUNTER — Encounter (HOSPITAL_BASED_OUTPATIENT_CLINIC_OR_DEPARTMENT_OTHER)
Admission: RE | Disposition: A | Discharge status: Home / Self Care | Payer: Self-pay | Source: Home / Self Care | Attending: Orthopedic Surgery

## 2022-01-29 ENCOUNTER — Other Ambulatory Visit: Payer: Self-pay

## 2022-01-29 ENCOUNTER — Ambulatory Visit (HOSPITAL_BASED_OUTPATIENT_CLINIC_OR_DEPARTMENT_OTHER)
Admission: RE | Admit: 2022-01-29 | Discharge: 2022-01-29 | Disposition: A | Discharge status: Home / Self Care | Payer: Federal, State, Local not specified - PPO | Attending: Orthopedic Surgery | Admitting: Orthopedic Surgery

## 2022-01-29 ENCOUNTER — Encounter (HOSPITAL_BASED_OUTPATIENT_CLINIC_OR_DEPARTMENT_OTHER): Payer: Self-pay | Admitting: Orthopedic Surgery

## 2022-01-29 DIAGNOSIS — R2231 Localized swelling, mass and lump, right upper limb: Secondary | ICD-10-CM | POA: Diagnosis not present

## 2022-01-29 DIAGNOSIS — K219 Gastro-esophageal reflux disease without esophagitis: Secondary | ICD-10-CM | POA: Diagnosis not present

## 2022-01-29 HISTORY — PX: EXCISION METACARPAL MASS: SHX6372

## 2022-01-29 SURGERY — EXCISION METACARPAL MASS
Anesthesia: Monitor Anesthesia Care | Site: Arm Lower | Laterality: Right

## 2022-01-29 MED ORDER — ONDANSETRON HCL 4 MG/2ML IJ SOLN: 4.0000 mg | Freq: Once | INTRAMUSCULAR | Status: DC | PRN

## 2022-01-29 MED ORDER — PROPOFOL 500 MG/50ML IV EMUL
INTRAVENOUS | Status: DC | PRN
  Administered 2022-01-29: 150 ug/kg/min via INTRAVENOUS

## 2022-01-29 MED ORDER — LIDOCAINE HCL (CARDIAC) PF 100 MG/5ML IV SOSY
PREFILLED_SYRINGE | INTRAVENOUS | Status: DC | PRN
  Administered 2022-01-29: 30 mg via INTRAVENOUS

## 2022-01-29 MED ORDER — TRAMADOL HCL 50 MG PO TABS: 50.0000 mg | ORAL_TABLET | Freq: Four times a day (QID) | ORAL | 0 refills | Status: DC | PRN

## 2022-01-29 MED ORDER — CEFAZOLIN SODIUM-DEXTROSE 2-4 GM/100ML-% IV SOLN
INTRAVENOUS | Status: AC
Start: 2022-01-29 — End: ?
  Filled 2022-01-29: qty 100

## 2022-01-29 MED ORDER — BUPIVACAINE HCL (PF) 0.25 % IJ SOLN
INTRAMUSCULAR | Status: AC
  Filled 2022-01-29: qty 30

## 2022-01-29 MED ORDER — LACTATED RINGERS IV SOLN: INTRAVENOUS | Status: DC

## 2022-01-29 MED ORDER — MIDAZOLAM HCL 5 MG/5ML IJ SOLN
INTRAMUSCULAR | Status: DC | PRN
Start: 2022-01-29 — End: 2022-01-29
  Administered 2022-01-29: 2 mg via INTRAVENOUS

## 2022-01-29 MED ORDER — FENTANYL CITRATE (PF) 100 MCG/2ML IJ SOLN
INTRAMUSCULAR | Status: DC | PRN
  Administered 2022-01-29 (×2): 50 ug via INTRAVENOUS

## 2022-01-29 MED ORDER — BUPIVACAINE HCL (PF) 0.25 % IJ SOLN
INTRAMUSCULAR | Status: DC | PRN
Start: 2022-01-29 — End: 2022-01-29
  Administered 2022-01-29: 10 mL

## 2022-01-29 MED ORDER — 0.9 % SODIUM CHLORIDE (POUR BTL) OPTIME
TOPICAL | Status: DC | PRN
  Administered 2022-01-29: 100 mL

## 2022-01-29 MED ORDER — FENTANYL CITRATE (PF) 100 MCG/2ML IJ SOLN: 25.0000 ug | INTRAMUSCULAR | Status: DC | PRN

## 2022-01-29 MED ORDER — OXYCODONE HCL 5 MG/5ML PO SOLN: 5.0000 mg | Freq: Once | ORAL | Status: DC | PRN

## 2022-01-29 MED ORDER — FENTANYL CITRATE (PF) 100 MCG/2ML IJ SOLN
INTRAMUSCULAR | Status: AC
Start: 2022-01-29 — End: ?
  Filled 2022-01-29: qty 2

## 2022-01-29 MED ORDER — OXYCODONE HCL 5 MG PO TABS: 5.0000 mg | ORAL_TABLET | Freq: Once | ORAL | Status: DC | PRN

## 2022-01-29 MED ORDER — KETOROLAC TROMETHAMINE 30 MG/ML IJ SOLN: 30.0000 mg | Freq: Once | INTRAMUSCULAR | Status: DC | PRN

## 2022-01-29 MED ORDER — CEFAZOLIN SODIUM-DEXTROSE 2-4 GM/100ML-% IV SOLN
2.0000 g | INTRAVENOUS | Status: AC
  Administered 2022-01-29: 2 g via INTRAVENOUS

## 2022-01-29 MED ORDER — MIDAZOLAM HCL 2 MG/2ML IJ SOLN
INTRAMUSCULAR | Status: AC
  Filled 2022-01-29: qty 2

## 2022-01-29 SURGICAL SUPPLY — 50 items
APL PRP STRL LF DISP 70% ISPRP (MISCELLANEOUS) ×1
BLADE MINI RND TIP GREEN BEAV (BLADE) IMPLANT
BLADE SURG 15 STRL LF DISP TIS (BLADE) ×1 IMPLANT
BLADE SURG 15 STRL SS (BLADE) ×2
BNDG CMPR 5X2 CHSV 1 LYR STRL (GAUZE/BANDAGES/DRESSINGS)
BNDG CMPR 5X3 CHSV STRCH STRL (GAUZE/BANDAGES/DRESSINGS)
BNDG CMPR 9X4 STRL LF SNTH (GAUZE/BANDAGES/DRESSINGS)
BNDG COHESIVE 1X5 TAN STRL LF (GAUZE/BANDAGES/DRESSINGS) IMPLANT
BNDG COHESIVE 2X5 TAN ST LF (GAUZE/BANDAGES/DRESSINGS) IMPLANT
BNDG COHESIVE 3X5 TAN ST LF (GAUZE/BANDAGES/DRESSINGS) IMPLANT
BNDG ESMARK 4X9 LF (GAUZE/BANDAGES/DRESSINGS) IMPLANT
BNDG GAUZE ELAST 4 BULKY (GAUZE/BANDAGES/DRESSINGS) IMPLANT
CHLORAPREP W/TINT 26 (MISCELLANEOUS) ×2 IMPLANT
CORD BIPOLAR FORCEPS 12FT (ELECTRODE) ×2 IMPLANT
COVER BACK TABLE 60X90IN (DRAPES) ×2 IMPLANT
COVER MAYO STAND STRL (DRAPES) ×2 IMPLANT
CUFF TOURN SGL QUICK 18X4 (TOURNIQUET CUFF) IMPLANT
DRAIN PENROSE .5X12 LATEX STL (DRAIN) IMPLANT
DRAPE EXTREMITY T 121X128X90 (DISPOSABLE) ×2 IMPLANT
DRAPE SURG 17X23 STRL (DRAPES) ×2 IMPLANT
GAUZE SPONGE 4X4 12PLY STRL (GAUZE/BANDAGES/DRESSINGS) ×2 IMPLANT
GAUZE XEROFORM 1X8 LF (GAUZE/BANDAGES/DRESSINGS) ×2 IMPLANT
GLOVE SURG ORTHO LTX SZ8 (GLOVE) ×2 IMPLANT
GLOVE SURG UNDER POLY LF SZ8.5 (GLOVE) ×2 IMPLANT
GOWN STRL REUS W/ TWL LRG LVL3 (GOWN DISPOSABLE) ×1 IMPLANT
GOWN STRL REUS W/TWL LRG LVL3 (GOWN DISPOSABLE) ×2
GOWN STRL REUS W/TWL XL LVL3 (GOWN DISPOSABLE) ×2 IMPLANT
NDL HYPO 27GX1-1/4 (NEEDLE) IMPLANT
NDL SAFETY ECLIPSE 18X1.5 (NEEDLE) ×1 IMPLANT
NEEDLE HYPO 18GX1.5 SHARP (NEEDLE) ×2
NEEDLE HYPO 27GX1-1/4 (NEEDLE) IMPLANT
NS IRRIG 1000ML POUR BTL (IV SOLUTION) ×2 IMPLANT
PACK BASIN DAY SURGERY FS (CUSTOM PROCEDURE TRAY) ×2 IMPLANT
PAD CAST 3X4 CTTN HI CHSV (CAST SUPPLIES) IMPLANT
PADDING CAST ABS 3INX4YD NS (CAST SUPPLIES)
PADDING CAST ABS 4INX4YD NS (CAST SUPPLIES) ×1
PADDING CAST ABS COTTON 3X4 (CAST SUPPLIES) IMPLANT
PADDING CAST ABS COTTON 4X4 ST (CAST SUPPLIES) ×1 IMPLANT
PADDING CAST COTTON 3X4 STRL (CAST SUPPLIES)
SPIKE FLUID TRANSFER (MISCELLANEOUS) IMPLANT
SPLINT FINGER 3.25 BULB 911905 (SOFTGOODS) ×2 IMPLANT
SPLINT PLASTER CAST XFAST 3X15 (CAST SUPPLIES) IMPLANT
SPLINT PLASTER XTRA FASTSET 3X (CAST SUPPLIES)
STOCKINETTE 4X48 STRL (DRAPES) ×2 IMPLANT
SUT ETHILON 4 0 PS 2 18 (SUTURE) ×2 IMPLANT
SUT VIC AB 4-0 P2 18 (SUTURE) IMPLANT
SYR BULB EAR ULCER 3OZ GRN STR (SYRINGE) ×2 IMPLANT
SYR CONTROL 10ML LL (SYRINGE) IMPLANT
TOWEL GREEN STERILE FF (TOWEL DISPOSABLE) ×4 IMPLANT
UNDERPAD 30X36 HEAVY ABSORB (UNDERPADS AND DIAPERS) ×2 IMPLANT

## 2022-01-29 NOTE — Op Note (Signed)
NAME: Jorge Turner ?MEDICAL RECORD NO: [id number] ?DATE OF BIRTH: [birth year] ?FACILITY: [facility] ?LOCATION: [place] ?PHYSICIAN: Wynonia Sours, MD ?  ?OPERATIVE REPORT ?  ?DATE OF PROCEDURE: 01/29/22  ?  ?PREOPERATIVE DIAGNOSIS: Mass right ring finger and mass right small finger centered phalangeal joint ?  ?POSTOPERATIVE DIAGNOSIS: Same ?  ?PROCEDURE: Excision mass right ring and excision mass right small finger ?  ?SURGEON: Daryll Brod, M.D. ?  ?ASSISTANT: none ?  ?ANESTHESIA:  Bier block with sedation and Local ?  ?INTRAVENOUS FLUIDS:  Per anesthesia flow sheet. ?  ?ESTIMATED BLOOD LOSS:  Minimal. ?  ?COMPLICATIONS:  None. ?  ?SPECIMENS: Mass ring and mass small sent separate ?  ?TOURNIQUET TIME:   ? ?Total Tourniquet Time Documented: ?Forearm (Right) - 36 minutes ?Total: Forearm (Right) - 36 minutes ? ?  ?DISPOSITION:  Stable to PACU. ?  ?INDICATIONS: Patient is a 66 year old male with a history of a mass in the distal phalangeal joint area of the right ring and right small fingers.  Ultrasounds reveal 2 small masses each finger approximately 1/2 cm on the small and 1-1/2 cm on the ring.  He is desires having these removed.  Pre-.  Postoperative course been discussed along with risk and complications.  He is aware that there is no guarantee to the surgery the possibility of infection recurrence injury to arteries nerves tendons complete relief symptoms dystrophy are discussed.  He is advised of the potential for recurrence.  Preoperative the patient is seen the extremity marked by both patient and surgeon antibiotic ? ?OPERATIVE COURSE: Patient brought to the operating room placed in supine position with the right arm free.  A forearm-based IV regional anesthetic was carried out without difficulty under the direction the anesthesia department.  Prep was done with ChloraPrep.  3-minute dry time was allowed timeout taken confirm patient procedure.  A metacarpal block was given quarter percent  bupivacaine without epinephrine at each of the digits total of 10 cc was used.  The small finger was approached first curvilinear incision was made over the mass the distal phalangeal joint area of the small finger carried down through subcutaneous tissue.  The fullness was immediately encountered.  With blunt sharp dissection a small multilobulated brown mass was excised measuring approximately 4 to 5 mm in diameter.  This was sent to pathology.  This was external to the joint and on top of the tendon wound was copious irrigated with saline.  The skin was closed interrupted 4 nylon sutures. ?The ring finger was attended to next a similar curvilinear incision was made over the DIP joint along the margin laterally of the finger carried down through subcutaneous tissue.  The mass was somewhat more to discrete on this finger.  With blunt sharp dissection it was dissected free was adjacent to the tendon and external to the joint.  This was excised in toto and sent to pathology.  It measured approximately 1.2 cm in size.  This wound was then irrigated and closed with interrupted 4 nylon sutures.  Sterile compressive dressing splint to each finger was applied.  Deflation of the tourniquet remaining fingers pink.  He was taken to the recovery room for observation in satisfactory condition.  He will be discharged home to return to the hand center of [facility] in 1 week on Tylenol ibuprofen for pain with Ultram for breakthrough. ? ? ?Daryll Brod, MD ?Electronically signed, 01/29/22 ? ?

## 2022-01-29 NOTE — Anesthesia Procedure Notes (Signed)
Anesthesia Regional Block: Bier block (IV Regional)  ? ?Pre-Anesthetic Checklist: , timeout performed,  Correct Patient, Correct Site, Correct Laterality,  Correct Procedure,, site marked,  Surgical consent,  At surgeon's request ? ?Laterality: Right ? ?Prep: alcohol swabs     ?  ?Needles:  ?Injection technique: Single-shot ? ?Needle Type: Other   ? ? ? ?Needle Gauge: 22  ? ? ? ?Additional Needles: ? ? ?Procedures:,,,,, intact distal pulses, Esmarch exsanguination,  Single tourniquet utilized    ?Narrative:  ?Start time: 01/29/2022 1:08 PM ?End time: 01/29/2022 1:09 PM ?Injection made incrementally with aspirations every 35 mL. ? ?Performed by: Personally  ? ?Additional Notes: ?Esmark wrap, torq inflated, neg pulse, slowly injested pres free 0.5%lido, pt tol well. VSS ? ? ? ?

## 2022-01-29 NOTE — Brief Op Note (Signed)
01/29/2022 ? ?1:48 PM ? ?PATIENT:  Jorge Turner  66 y.o. male ? ?PRE-OPERATIVE DIAGNOSIS:  MASS RIGHT RING FINGER; MASS RIGHT SMALL FINGER ? ?POST-OPERATIVE DIAGNOSIS:  MASS RIGHT RING FINGER; MASS RIGHT SMALL FINGER ? ?PROCEDURE:  Procedure(s) with comments: ?EXCISION METACARPAL MASS RIGHT RING FINGER AND RIGHT SMALL FINGER (Right) - 55 MIN ? ?SURGEON:  Surgeon(s) and Role: ?   Daryll Brod, MD - Primary ? ?PHYSICIAN ASSISTANT:  ? ?ASSISTANTS: none  ? ?ANESTHESIA:   local, regional, and IV sedation ? ?EBL:  2 mL  ? ?BLOOD ADMINISTERED:none ? ?DRAINS: none  ? ?LOCAL MEDICATIONS USED:  BUPIVICAINE  ? ?SPECIMEN:  Excision ? ?DISPOSITION OF SPECIMEN:  PATHOLOGY ? ?COUNTS:  YES ? ?TOURNIQUET:   ?Total Tourniquet Time Documented: ?Forearm (Right) - 36 minutes ?Total: Forearm (Right) - 36 minutes ? ? ?DICTATION: .Dragon Dictation ? ?PLAN OF CARE: Discharge to home after PACU ? ?PATIENT DISPOSITION:  PACU - hemodynamically stable. ?  ? ? ?

## 2022-01-29 NOTE — Anesthesia Postprocedure Evaluation (Signed)
Anesthesia Post Note ? ?Patient: Jorge Turner ? ?Procedure(s) Performed: EXCISION METACARPAL MASS RIGHT RING FINGER AND RIGHT SMALL FINGER (Right: Arm Lower) ? ?  ? ?Patient location during evaluation: PACU ?Anesthesia Type: MAC ?Level of consciousness: awake and alert ?Pain management: pain level controlled ?Vital Signs Assessment: post-procedure vital signs reviewed and stable ?Respiratory status: spontaneous breathing, nonlabored ventilation, respiratory function stable and patient connected to nasal cannula oxygen ?Cardiovascular status: stable and blood pressure returned to baseline ?Postop Assessment: no apparent nausea or vomiting ?Anesthetic complications: no ? ? ?No notable events documented. ? ?Last Vitals:  ?Vitals:  ? 01/29/22 1158 01/29/22 1351  ?BP: 125/83 105/71  ?Pulse: (!) 58 60  ?Resp: 18 14  ?Temp: 36.7 ?C (!) 36.1 ?C  ?SpO2: 100% 100%  ?  ?Last Pain:  ?Vitals:  ? 01/29/22 1158  ?TempSrc: Oral  ?PainSc: 0-No pain  ? ? ?  ?  ?  ?  ?  ?  ? ?Yaritzi Craun S ? ? ? ? ?

## 2022-01-29 NOTE — Discharge Instructions (Addendum)

## 2022-01-29 NOTE — Transfer of Care (Signed)
Immediate Anesthesia Transfer of Care Note ? ?Patient: Jorge Turner ? ?Procedure(s) Performed: EXCISION METACARPAL MASS RIGHT RING FINGER AND RIGHT SMALL FINGER (Right: Arm Lower) ? ?Patient Location: PACU ? ?Anesthesia Type:MAC ? ?Level of Consciousness: drowsy ? ?Airway & Oxygen Therapy: Patient Spontanous Breathing and Patient connected to face mask oxygen ? ?Post-op Assessment: Report given to RN and Post -op Vital signs reviewed and stable ? ?Post vital signs: Reviewed and stable ? ?Last Vitals:  ?Vitals Value Taken Time  ?BP 105/71 01/29/22 1349  ?Temp    ?Pulse 60 01/29/22 1350  ?Resp 10 01/29/22 1350  ?SpO2 100 % 01/29/22 1350  ?Vitals shown include unvalidated device data. ? ?Last Pain:  ?Vitals:  ? 01/29/22 1158  ?TempSrc: Oral  ?PainSc: 0-No pain  ?   ? ?  ? ?Complications: No notable events documented. ?

## 2022-01-29 NOTE — Anesthesia Preprocedure Evaluation (Signed)
Anesthesia Evaluation  ?Patient identified by MRN, date of birth, ID band ?Patient awake ? ? ? ?Reviewed: ?Allergy & Precautions, NPO status , Patient's Chart, lab work & pertinent test results ? ?Airway ?Mallampati: II ? ?TM Distance: >3 FB ?Neck ROM: Full ? ? ? Dental ?no notable dental hx. ? ?  ?Pulmonary ?neg pulmonary ROS,  ?  ?Pulmonary exam normal ?breath sounds clear to auscultation ? ? ? ? ? ? Cardiovascular ?negative cardio ROS ?Normal cardiovascular exam ?Rhythm:Regular Rate:Normal ? ? ?  ?Neuro/Psych ?negative neurological ROS ? negative psych ROS  ? GI/Hepatic ?Neg liver ROS, GERD  Medicated,  ?Endo/Other  ?negative endocrine ROS ? Renal/GU ?negative Renal ROS  ?negative genitourinary ?  ?Musculoskeletal ?negative musculoskeletal ROS ?(+)  ? Abdominal ?  ?Peds ?negative pediatric ROS ?(+)  Hematology ?negative hematology ROS ?(+)   ?Anesthesia Other Findings ? ? Reproductive/Obstetrics ?negative OB ROS ? ?  ? ? ? ? ? ? ? ? ? ? ? ? ? ?  ?  ? ? ? ? ? ? ? ? ?Anesthesia Physical ?Anesthesia Plan ? ?ASA: 2 ? ?Anesthesia Plan: MAC and Bier Block and Bier Block-LIDOCAINE ONLY  ? ?Post-op Pain Management: Minimal or no pain anticipated  ? ?Induction: Intravenous ? ?PONV Risk Score and Plan: 1 and Propofol infusion and Treatment may vary due to age or medical condition ? ?Airway Management Planned: Simple Face Mask ? ?Additional Equipment:  ? ?Intra-op Plan:  ? ?Post-operative Plan:  ? ?Informed Consent: I have reviewed the patients History and Physical, chart, labs and discussed the procedure including the risks, benefits and alternatives for the proposed anesthesia with the patient or authorized representative who has indicated his/her understanding and acceptance.  ? ? ? ?Dental advisory given ? ?Plan Discussed with: CRNA and Surgeon ? ?Anesthesia Plan Comments:   ? ? ? ? ? ? ?Anesthesia Quick Evaluation ? ?

## 2022-01-29 NOTE — H&P (Signed)
?Jorge Turner is an 66 y.o. male.   ?Chief Complaint: masses right fingers ?HPI: He is a 66 yo malecomplaining of a mass on his ring finger. ?He had the ultrasound done on 08/31/2021 signed by Dr. Jake Shark revealing a hypoechoic soft tissue mass overlying the distal phalangeal joint ring and small fingers with the possibility of these being giant cell tumors.The fifth finger demonstrates a hypoechoic, oval mass, adjacent to  ?the D IP joint, abutting the underlying bony cortex, measuring 8 x 4  ?x 5 mm.  ? ?The ring finger mass lies just superficial to the D IP joint, also  ?hypoechoic and oval, measuring 1.1 x 0.5 x 0.9 cm.  ?Past Medical History:  ?Diagnosis Date  ? Balanitis   ? BPH (benign prostatic hypertrophy)   ? DJD (degenerative joint disease) of knee   ? Eczema   ? GERD (gastroesophageal reflux disease)   ? Hemorrhoid   ? Low back pain   ? Medial meniscus tear   ? left  ? Patellofemoral arthralgia of left knee   ? Personal history of colonic polyps 03/03/2010  ? Vitamin B12 deficiency   ? ? ?Past Surgical History:  ?Procedure Laterality Date  ? ANTERIOR INTEROSSEOUS NERVE DECOMPRESSION Right 07/10/2020  ? Procedure: POSTERIOR INTEROSSEOUS NERVE RESECTION;  Surgeon: Daryll Brod, MD;  Location: [place];  Service: Orthopedics;  Laterality: Right;  AXILLARY BLOCK  ? CARPAL TUNNEL RELEASE Right 04/11/2018  ? Procedure: RIGHT OPEN CARPAL TUNNEL RELEASE;  Surgeon: Jessy Oto, MD;  Location: [place];  Service: Orthopedics;  Laterality: Right;  ? CARPECTOMY WITH RADIAL STYLOIDECTOMY Right 07/10/2020  ? Procedure: RIGHT PROXIMAL ROW CARPECTOMY WITH RADIAL STYLOIDECTOMY;  Surgeon: Daryll Brod, MD;  Location: [place];  Service: Orthopedics;  Laterality: Right;  AXILLARY BLOCK  ? CIRCUMCISION N/A 07/20/2013  ? Procedure: CIRCUMCISION ADULT;  Surgeon: Claybon Jabs, MD;  Location: [facility];  Service: Urology;  Laterality: N/A;  ? COLONOSCOPY    ? CYSTO  REMOVAL RIGHT THUMB  2006  ? RIGHT HYDROCELECTOMY  11-13-2010  ? ? ?Family History  ?Problem Relation Age of Onset  ? Hypertension Mother   ? Diabetes Mother   ? Hypertension Father   ? Diabetes Father   ? Heart disease Father   ?     CABG  ? Heart disease Sister   ?     CAD  ? Colon cancer Neg Hx   ? Rectal cancer Neg Hx   ? Stomach cancer Neg Hx   ? ?Social History:  reports that he has never smoked. He has never used smokeless tobacco. He reports that he does not drink alcohol and does not use drugs. ? ?Allergies:  ?Allergies  ?Allergen Reactions  ? Erythromycin Ethylsuccinate Anaphylaxis  ? Erythromycin Ethylsuccinate Anaphylaxis  ? Doxycycline Nausea And Vomiting  ? ? ?No medications prior to admission.  ? ? ?No results found for this or any previous visit (from the past 48 hour(s)). ? ?No results found. ? ? ?Pertinent items are noted in HPI. ? ?Height 5\' 10"  (1.778 m), weight 74.8 kg. ? ?General appearance: alert, cooperative, and appears stated age ?Head: Normocephalic, without obvious abnormality ?Neck: no JVD ?Resp: clear to auscultation bilaterally ?Cardio: regular rate and rhythm, S1, S2 normal, no murmur, click, rub or gallop ?GI: soft, non-tender; bowel sounds normal; no masses,  no organomegaly ?Extremities:  masse right ring and small fingers ?Pulses: 2+ and symmetric ?Skin: Skin color, texture,  turgor normal. No rashes or lesions ?Neurologic: Grossly normal ?Incision/Wound: ?na ? ?Assessment/Plan ?Diagnosis mass right ring and small finger DIP joints ?Plan: He would like to have these removed. Prepare postoperative course are discussed along with risk and complications. He is aware there is no guarantee to the surgery the possibility of infection recurrence injury to arteries nerves tendons complete relief symptoms and dystrophy. He is scheduled for excision mass right ring right small finger as an outpatient under regional anesthesia.  ?Daryll Brod ?01/29/2022, 6:12 AM ? ?  ?

## 2022-02-01 ENCOUNTER — Encounter (HOSPITAL_BASED_OUTPATIENT_CLINIC_OR_DEPARTMENT_OTHER): Payer: Self-pay | Admitting: Orthopedic Surgery

## 2022-02-01 NOTE — Progress Notes (Signed)
Left message stating courtesy call and if any questions or concerns please call the doctors office.  

## 2022-02-02 LAB — SURGICAL PATHOLOGY

## 2022-02-03 DIAGNOSIS — R2231 Localized swelling, mass and lump, right upper limb: Secondary | ICD-10-CM | POA: Insufficient documentation

## 2022-02-09 DIAGNOSIS — M542 Cervicalgia: Secondary | ICD-10-CM | POA: Diagnosis not present

## 2022-02-09 DIAGNOSIS — M25561 Pain in right knee: Secondary | ICD-10-CM | POA: Diagnosis not present

## 2022-02-09 DIAGNOSIS — M545 Low back pain, unspecified: Secondary | ICD-10-CM | POA: Diagnosis not present

## 2022-02-09 DIAGNOSIS — M19031 Primary osteoarthritis, right wrist: Secondary | ICD-10-CM | POA: Diagnosis not present

## 2022-02-10 ENCOUNTER — Other Ambulatory Visit: Payer: Self-pay

## 2022-02-10 ENCOUNTER — Encounter: Payer: Self-pay | Admitting: Emergency Medicine

## 2022-02-10 ENCOUNTER — Ambulatory Visit (INDEPENDENT_AMBULATORY_CARE_PROVIDER_SITE_OTHER): Payer: Federal, State, Local not specified - PPO

## 2022-02-10 ENCOUNTER — Ambulatory Visit: Payer: Federal, State, Local not specified - PPO | Admitting: Emergency Medicine

## 2022-02-10 DIAGNOSIS — S199XXA Unspecified injury of neck, initial encounter: Secondary | ICD-10-CM | POA: Diagnosis not present

## 2022-02-10 DIAGNOSIS — M542 Cervicalgia: Secondary | ICD-10-CM | POA: Diagnosis not present

## 2022-02-10 DIAGNOSIS — R079 Chest pain, unspecified: Secondary | ICD-10-CM | POA: Diagnosis not present

## 2022-02-10 DIAGNOSIS — M62838 Other muscle spasm: Secondary | ICD-10-CM | POA: Diagnosis not present

## 2022-02-10 MED ORDER — CYCLOBENZAPRINE HCL 10 MG PO TABS: 10.0000 mg | ORAL_TABLET | Freq: Three times a day (TID) | ORAL | 0 refills | Status: DC | PRN
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
## 2022-02-16 DIAGNOSIS — M545 Low back pain, unspecified: Secondary | ICD-10-CM | POA: Diagnosis not present

## 2022-02-16 DIAGNOSIS — M19031 Primary osteoarthritis, right wrist: Secondary | ICD-10-CM | POA: Diagnosis not present

## 2022-02-16 DIAGNOSIS — M542 Cervicalgia: Secondary | ICD-10-CM | POA: Diagnosis not present

## 2022-02-16 DIAGNOSIS — M25561 Pain in right knee: Secondary | ICD-10-CM | POA: Diagnosis not present

## 2022-02-22 DIAGNOSIS — N400 Enlarged prostate without lower urinary tract symptoms: Secondary | ICD-10-CM | POA: Diagnosis not present

## 2022-02-23 DIAGNOSIS — M542 Cervicalgia: Secondary | ICD-10-CM | POA: Diagnosis not present

## 2022-02-23 DIAGNOSIS — M25561 Pain in right knee: Secondary | ICD-10-CM | POA: Diagnosis not present

## 2022-02-23 DIAGNOSIS — M545 Low back pain, unspecified: Secondary | ICD-10-CM | POA: Diagnosis not present

## 2022-02-23 DIAGNOSIS — M19031 Primary osteoarthritis, right wrist: Secondary | ICD-10-CM | POA: Diagnosis not present

## 2022-03-04 DIAGNOSIS — M546 Pain in thoracic spine: Secondary | ICD-10-CM | POA: Diagnosis not present

## 2022-03-04 DIAGNOSIS — M25511 Pain in right shoulder: Secondary | ICD-10-CM | POA: Diagnosis not present

## 2022-03-04 DIAGNOSIS — M542 Cervicalgia: Secondary | ICD-10-CM | POA: Diagnosis not present

## 2022-03-08 ENCOUNTER — Ambulatory Visit: Payer: Federal, State, Local not specified - PPO | Admitting: Internal Medicine

## 2022-03-08 ENCOUNTER — Encounter: Payer: Self-pay | Admitting: Internal Medicine

## 2022-03-08 DIAGNOSIS — S134XXA Sprain of ligaments of cervical spine, initial encounter: Secondary | ICD-10-CM | POA: Diagnosis not present

## 2022-03-08 DIAGNOSIS — M25511 Pain in right shoulder: Secondary | ICD-10-CM | POA: Diagnosis not present

## 2022-03-08 DIAGNOSIS — E538 Deficiency of other specified B group vitamins: Secondary | ICD-10-CM | POA: Diagnosis not present

## 2022-03-08 MED ORDER — FAMOTIDINE 40 MG PO TABS: 40.0000 mg | ORAL_TABLET | Freq: Every day | ORAL | 3 refills | Status: AC

## 2022-03-08 MED ORDER — CELECOXIB 200 MG PO CAPS: 200.0000 mg | ORAL_CAPSULE | Freq: Two times a day (BID) | ORAL | 3 refills | Status: DC | PRN

## 2022-03-08 NOTE — Patient Instructions (Addendum)
Blue-Emu cream use 2-3 times a day ? ?Make a rice sock heating pad ?
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
## 2022-03-08 NOTE — Assessment & Plan Note (Signed)
On B12 

## 2022-03-16 DIAGNOSIS — M25511 Pain in right shoulder: Secondary | ICD-10-CM | POA: Diagnosis not present

## 2022-03-16 DIAGNOSIS — M542 Cervicalgia: Secondary | ICD-10-CM | POA: Diagnosis not present

## 2022-03-16 DIAGNOSIS — M546 Pain in thoracic spine: Secondary | ICD-10-CM | POA: Diagnosis not present

## 2022-03-23 DIAGNOSIS — M25511 Pain in right shoulder: Secondary | ICD-10-CM | POA: Diagnosis not present

## 2022-03-23 DIAGNOSIS — M546 Pain in thoracic spine: Secondary | ICD-10-CM | POA: Diagnosis not present

## 2022-03-23 DIAGNOSIS — M542 Cervicalgia: Secondary | ICD-10-CM | POA: Diagnosis not present

## 2022-03-28 DIAGNOSIS — M25511 Pain in right shoulder: Secondary | ICD-10-CM | POA: Insufficient documentation

## 2022-03-28 DIAGNOSIS — S134XXA Sprain of ligaments of cervical spine, initial encounter: Secondary | ICD-10-CM | POA: Insufficient documentation
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
## 2022-03-30 DIAGNOSIS — M542 Cervicalgia: Secondary | ICD-10-CM | POA: Diagnosis not present

## 2022-03-30 DIAGNOSIS — M546 Pain in thoracic spine: Secondary | ICD-10-CM | POA: Diagnosis not present

## 2022-03-30 DIAGNOSIS — M25511 Pain in right shoulder: Secondary | ICD-10-CM | POA: Diagnosis not present

## 2022-04-01 DIAGNOSIS — R972 Elevated prostate specific antigen [PSA]: Secondary | ICD-10-CM | POA: Diagnosis not present

## 2022-04-06 DIAGNOSIS — M542 Cervicalgia: Secondary | ICD-10-CM | POA: Diagnosis not present

## 2022-04-06 DIAGNOSIS — M546 Pain in thoracic spine: Secondary | ICD-10-CM | POA: Diagnosis not present

## 2022-04-06 DIAGNOSIS — M25511 Pain in right shoulder: Secondary | ICD-10-CM | POA: Diagnosis not present

## 2022-04-08 DIAGNOSIS — M79644 Pain in right finger(s): Secondary | ICD-10-CM | POA: Diagnosis not present

## 2022-04-08 DIAGNOSIS — M25641 Stiffness of right hand, not elsewhere classified: Secondary | ICD-10-CM | POA: Diagnosis not present

## 2022-04-08 DIAGNOSIS — R2231 Localized swelling, mass and lump, right upper limb: Secondary | ICD-10-CM | POA: Diagnosis not present

## 2022-04-09 DIAGNOSIS — R972 Elevated prostate specific antigen [PSA]: Secondary | ICD-10-CM | POA: Diagnosis not present

## 2022-04-09 DIAGNOSIS — R350 Frequency of micturition: Secondary | ICD-10-CM | POA: Diagnosis not present

## 2022-04-09 DIAGNOSIS — N5201 Erectile dysfunction due to arterial insufficiency: Secondary | ICD-10-CM | POA: Diagnosis not present

## 2022-04-13 DIAGNOSIS — M542 Cervicalgia: Secondary | ICD-10-CM | POA: Diagnosis not present

## 2022-04-13 DIAGNOSIS — M25511 Pain in right shoulder: Secondary | ICD-10-CM | POA: Diagnosis not present

## 2022-04-13 DIAGNOSIS — M546 Pain in thoracic spine: Secondary | ICD-10-CM | POA: Diagnosis not present

## 2022-04-20 ENCOUNTER — Ambulatory Visit: Payer: Federal, State, Local not specified - PPO | Admitting: Internal Medicine

## 2022-04-20 DIAGNOSIS — M25511 Pain in right shoulder: Secondary | ICD-10-CM | POA: Diagnosis not present

## 2022-04-20 DIAGNOSIS — M542 Cervicalgia: Secondary | ICD-10-CM | POA: Diagnosis not present

## 2022-04-20 DIAGNOSIS — M546 Pain in thoracic spine: Secondary | ICD-10-CM | POA: Diagnosis not present

## 2022-04-27 DIAGNOSIS — M25511 Pain in right shoulder: Secondary | ICD-10-CM | POA: Diagnosis not present

## 2022-04-27 DIAGNOSIS — M546 Pain in thoracic spine: Secondary | ICD-10-CM | POA: Diagnosis not present

## 2022-04-27 DIAGNOSIS — M542 Cervicalgia: Secondary | ICD-10-CM | POA: Diagnosis not present

## 2022-04-30 DIAGNOSIS — R2231 Localized swelling, mass and lump, right upper limb: Secondary | ICD-10-CM | POA: Diagnosis not present

## 2022-04-30 DIAGNOSIS — M25641 Stiffness of right hand, not elsewhere classified: Secondary | ICD-10-CM | POA: Diagnosis not present

## 2022-04-30 DIAGNOSIS — M79644 Pain in right finger(s): Secondary | ICD-10-CM | POA: Diagnosis not present
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
## 2022-05-24 DIAGNOSIS — R2231 Localized swelling, mass and lump, right upper limb: Secondary | ICD-10-CM | POA: Diagnosis not present

## 2022-05-24 DIAGNOSIS — M25831 Other specified joint disorders, right wrist: Secondary | ICD-10-CM | POA: Diagnosis not present

## 2022-05-24 DIAGNOSIS — M79644 Pain in right finger(s): Secondary | ICD-10-CM | POA: Diagnosis not present

## 2022-05-24 DIAGNOSIS — M25641 Stiffness of right hand, not elsewhere classified: Secondary | ICD-10-CM | POA: Diagnosis not present

## 2022-06-04 ENCOUNTER — Other Ambulatory Visit: Payer: Self-pay | Admitting: Internal Medicine

## 2022-06-04 DIAGNOSIS — R2231 Localized swelling, mass and lump, right upper limb: Secondary | ICD-10-CM | POA: Diagnosis not present

## 2022-06-04 DIAGNOSIS — M25831 Other specified joint disorders, right wrist: Secondary | ICD-10-CM | POA: Diagnosis not present

## 2022-06-04 DIAGNOSIS — M25641 Stiffness of right hand, not elsewhere classified: Secondary | ICD-10-CM | POA: Diagnosis not present

## 2022-06-04 DIAGNOSIS — M79644 Pain in right finger(s): Secondary | ICD-10-CM | POA: Diagnosis not present

## 2022-06-14 DIAGNOSIS — M79644 Pain in right finger(s): Secondary | ICD-10-CM | POA: Diagnosis not present

## 2022-06-14 DIAGNOSIS — M25641 Stiffness of right hand, not elsewhere classified: Secondary | ICD-10-CM | POA: Diagnosis not present

## 2022-06-14 DIAGNOSIS — R2231 Localized swelling, mass and lump, right upper limb: Secondary | ICD-10-CM | POA: Diagnosis not present

## 2022-06-25 DIAGNOSIS — M25831 Other specified joint disorders, right wrist: Secondary | ICD-10-CM | POA: Diagnosis not present

## 2022-06-25 DIAGNOSIS — M79644 Pain in right finger(s): Secondary | ICD-10-CM | POA: Diagnosis not present

## 2022-06-25 DIAGNOSIS — R2231 Localized swelling, mass and lump, right upper limb: Secondary | ICD-10-CM | POA: Diagnosis not present

## 2022-06-25 DIAGNOSIS — M25641 Stiffness of right hand, not elsewhere classified: Secondary | ICD-10-CM | POA: Diagnosis not present

## 2022-06-29 DIAGNOSIS — M25511 Pain in right shoulder: Secondary | ICD-10-CM | POA: Diagnosis not present

## 2022-06-29 DIAGNOSIS — M546 Pain in thoracic spine: Secondary | ICD-10-CM | POA: Diagnosis not present

## 2022-06-29 DIAGNOSIS — M542 Cervicalgia: Secondary | ICD-10-CM | POA: Diagnosis not present

## 2022-07-02 DIAGNOSIS — R2231 Localized swelling, mass and lump, right upper limb: Secondary | ICD-10-CM | POA: Diagnosis not present

## 2022-07-02 DIAGNOSIS — M79644 Pain in right finger(s): Secondary | ICD-10-CM | POA: Diagnosis not present

## 2022-07-02 DIAGNOSIS — M25831 Other specified joint disorders, right wrist: Secondary | ICD-10-CM | POA: Diagnosis not present

## 2022-07-02 DIAGNOSIS — M25641 Stiffness of right hand, not elsewhere classified: Secondary | ICD-10-CM | POA: Diagnosis not present

## 2022-07-05 DIAGNOSIS — M542 Cervicalgia: Secondary | ICD-10-CM | POA: Diagnosis not present

## 2022-07-05 DIAGNOSIS — M25511 Pain in right shoulder: Secondary | ICD-10-CM | POA: Diagnosis not present

## 2022-07-05 DIAGNOSIS — M546 Pain in thoracic spine: Secondary | ICD-10-CM | POA: Diagnosis not present

## 2022-07-09 DIAGNOSIS — M25641 Stiffness of right hand, not elsewhere classified: Secondary | ICD-10-CM | POA: Diagnosis not present

## 2022-07-09 DIAGNOSIS — M79644 Pain in right finger(s): Secondary | ICD-10-CM | POA: Diagnosis not present

## 2022-07-09 DIAGNOSIS — M25831 Other specified joint disorders, right wrist: Secondary | ICD-10-CM | POA: Diagnosis not present

## 2022-07-09 DIAGNOSIS — R2231 Localized swelling, mass and lump, right upper limb: Secondary | ICD-10-CM | POA: Diagnosis not present

## 2022-07-12 DIAGNOSIS — M542 Cervicalgia: Secondary | ICD-10-CM | POA: Diagnosis not present

## 2022-07-12 DIAGNOSIS — M546 Pain in thoracic spine: Secondary | ICD-10-CM | POA: Diagnosis not present

## 2022-07-12 DIAGNOSIS — M25511 Pain in right shoulder: Secondary | ICD-10-CM | POA: Diagnosis not present

## 2022-07-19 DIAGNOSIS — M546 Pain in thoracic spine: Secondary | ICD-10-CM | POA: Diagnosis not present

## 2022-07-19 DIAGNOSIS — M25511 Pain in right shoulder: Secondary | ICD-10-CM | POA: Diagnosis not present

## 2022-07-19 DIAGNOSIS — M542 Cervicalgia: Secondary | ICD-10-CM | POA: Diagnosis not present

## 2022-08-27 ENCOUNTER — Other Ambulatory Visit: Payer: Self-pay | Admitting: Internal Medicine

## 2022-09-01 ENCOUNTER — Other Ambulatory Visit: Payer: Self-pay | Admitting: Internal Medicine

## 2022-09-17 ENCOUNTER — Encounter: Payer: Self-pay | Admitting: Internal Medicine

## 2022-09-20 DIAGNOSIS — M542 Cervicalgia: Secondary | ICD-10-CM | POA: Diagnosis not present

## 2022-09-20 DIAGNOSIS — M546 Pain in thoracic spine: Secondary | ICD-10-CM | POA: Diagnosis not present

## 2022-09-20 DIAGNOSIS — M25511 Pain in right shoulder: Secondary | ICD-10-CM | POA: Diagnosis not present

## 2022-09-27 DIAGNOSIS — M546 Pain in thoracic spine: Secondary | ICD-10-CM | POA: Diagnosis not present

## 2022-09-27 DIAGNOSIS — M542 Cervicalgia: Secondary | ICD-10-CM | POA: Diagnosis not present

## 2022-09-27 DIAGNOSIS — M25511 Pain in right shoulder: Secondary | ICD-10-CM | POA: Diagnosis not present

## 2022-10-04 DIAGNOSIS — M546 Pain in thoracic spine: Secondary | ICD-10-CM | POA: Diagnosis not present

## 2022-10-04 DIAGNOSIS — M25511 Pain in right shoulder: Secondary | ICD-10-CM | POA: Diagnosis not present

## 2022-10-04 DIAGNOSIS — M542 Cervicalgia: Secondary | ICD-10-CM | POA: Diagnosis not present

## 2022-10-06 ENCOUNTER — Other Ambulatory Visit: Payer: Self-pay | Admitting: Internal Medicine

## 2022-10-11 DIAGNOSIS — M25511 Pain in right shoulder: Secondary | ICD-10-CM | POA: Diagnosis not present

## 2022-10-11 DIAGNOSIS — M546 Pain in thoracic spine: Secondary | ICD-10-CM | POA: Diagnosis not present

## 2022-10-11 DIAGNOSIS — M542 Cervicalgia: Secondary | ICD-10-CM | POA: Diagnosis not present

## 2022-10-18 DIAGNOSIS — M546 Pain in thoracic spine: Secondary | ICD-10-CM | POA: Diagnosis not present

## 2022-10-18 DIAGNOSIS — M542 Cervicalgia: Secondary | ICD-10-CM | POA: Diagnosis not present

## 2022-10-18 DIAGNOSIS — M25511 Pain in right shoulder: Secondary | ICD-10-CM | POA: Diagnosis not present

## 2022-11-01 DIAGNOSIS — M546 Pain in thoracic spine: Secondary | ICD-10-CM | POA: Diagnosis not present

## 2022-11-01 DIAGNOSIS — M25511 Pain in right shoulder: Secondary | ICD-10-CM | POA: Diagnosis not present

## 2022-11-01 DIAGNOSIS — M542 Cervicalgia: Secondary | ICD-10-CM | POA: Diagnosis not present

## 2022-11-11 DIAGNOSIS — M25511 Pain in right shoulder: Secondary | ICD-10-CM | POA: Diagnosis not present

## 2022-11-11 DIAGNOSIS — M542 Cervicalgia: Secondary | ICD-10-CM | POA: Diagnosis not present

## 2022-11-11 DIAGNOSIS — M546 Pain in thoracic spine: Secondary | ICD-10-CM | POA: Diagnosis not present

## 2022-11-15 DIAGNOSIS — M546 Pain in thoracic spine: Secondary | ICD-10-CM | POA: Diagnosis not present

## 2022-11-15 DIAGNOSIS — M25511 Pain in right shoulder: Secondary | ICD-10-CM | POA: Diagnosis not present

## 2022-11-15 DIAGNOSIS — M542 Cervicalgia: Secondary | ICD-10-CM | POA: Diagnosis not present

## 2022-11-25 DIAGNOSIS — M25511 Pain in right shoulder: Secondary | ICD-10-CM | POA: Diagnosis not present

## 2022-11-25 DIAGNOSIS — M546 Pain in thoracic spine: Secondary | ICD-10-CM | POA: Diagnosis not present

## 2022-11-25 DIAGNOSIS — M542 Cervicalgia: Secondary | ICD-10-CM | POA: Diagnosis not present

## 2022-12-03 DIAGNOSIS — U071 COVID-19: Secondary | ICD-10-CM | POA: Diagnosis not present

## 2022-12-03 DIAGNOSIS — R6883 Chills (without fever): Secondary | ICD-10-CM | POA: Diagnosis not present

## 2022-12-03 DIAGNOSIS — R0981 Nasal congestion: Secondary | ICD-10-CM | POA: Diagnosis not present

## 2022-12-03 DIAGNOSIS — R051 Acute cough: Secondary | ICD-10-CM | POA: Diagnosis not present

## 2022-12-03 DIAGNOSIS — Z20822 Contact with and (suspected) exposure to covid-19: Secondary | ICD-10-CM | POA: Diagnosis not present

## 2022-12-17 DIAGNOSIS — B9689 Other specified bacterial agents as the cause of diseases classified elsewhere: Secondary | ICD-10-CM | POA: Diagnosis not present

## 2022-12-17 DIAGNOSIS — J019 Acute sinusitis, unspecified: Secondary | ICD-10-CM | POA: Diagnosis not present

## 2022-12-17 DIAGNOSIS — J101 Influenza due to other identified influenza virus with other respiratory manifestations: Secondary | ICD-10-CM | POA: Diagnosis not present

## 2022-12-28 DIAGNOSIS — K219 Gastro-esophageal reflux disease without esophagitis: Secondary | ICD-10-CM | POA: Diagnosis not present

## 2022-12-28 DIAGNOSIS — E785 Hyperlipidemia, unspecified: Secondary | ICD-10-CM | POA: Diagnosis not present

## 2022-12-28 DIAGNOSIS — E559 Vitamin D deficiency, unspecified: Secondary | ICD-10-CM | POA: Diagnosis not present

## 2022-12-28 DIAGNOSIS — N529 Male erectile dysfunction, unspecified: Secondary | ICD-10-CM | POA: Diagnosis not present

## 2023-01-10 DIAGNOSIS — M546 Pain in thoracic spine: Secondary | ICD-10-CM | POA: Diagnosis not present

## 2023-01-10 DIAGNOSIS — M25511 Pain in right shoulder: Secondary | ICD-10-CM | POA: Diagnosis not present

## 2023-01-10 DIAGNOSIS — M542 Cervicalgia: Secondary | ICD-10-CM | POA: Diagnosis not present

## 2023-01-17 DIAGNOSIS — M546 Pain in thoracic spine: Secondary | ICD-10-CM | POA: Diagnosis not present

## 2023-01-17 DIAGNOSIS — M542 Cervicalgia: Secondary | ICD-10-CM | POA: Diagnosis not present

## 2023-01-17 DIAGNOSIS — M25511 Pain in right shoulder: Secondary | ICD-10-CM | POA: Diagnosis not present

## 2023-01-24 DIAGNOSIS — M546 Pain in thoracic spine: Secondary | ICD-10-CM | POA: Diagnosis not present

## 2023-01-24 DIAGNOSIS — M25511 Pain in right shoulder: Secondary | ICD-10-CM | POA: Diagnosis not present

## 2023-01-24 DIAGNOSIS — M542 Cervicalgia: Secondary | ICD-10-CM | POA: Diagnosis not present

## 2023-02-01 ENCOUNTER — Telehealth: Payer: Self-pay | Admitting: Specialist

## 2023-02-01 DIAGNOSIS — M542 Cervicalgia: Secondary | ICD-10-CM | POA: Diagnosis not present

## 2023-02-01 DIAGNOSIS — M25511 Pain in right shoulder: Secondary | ICD-10-CM | POA: Diagnosis not present

## 2023-02-01 DIAGNOSIS — M5459 Other low back pain: Secondary | ICD-10-CM | POA: Diagnosis not present

## 2023-02-01 DIAGNOSIS — M25562 Pain in left knee: Secondary | ICD-10-CM | POA: Diagnosis not present

## 2023-02-01 NOTE — Telephone Encounter (Signed)
New message   The patient at the front desk - handicap placard will be expiring 3/24 .   Last office visit  08/05/2021.   Asking for a call back

## 2023-02-01 NOTE — Telephone Encounter (Signed)
yeah

## 2023-02-02 NOTE — Telephone Encounter (Signed)
Fyi I scheduled for him to come see you tomorrow to discuss this

## 2023-02-03 ENCOUNTER — Ambulatory Visit: Payer: Federal, State, Local not specified - PPO | Admitting: Physician Assistant

## 2023-02-03 ENCOUNTER — Encounter: Payer: Self-pay | Admitting: Physician Assistant

## 2023-02-03 DIAGNOSIS — M25561 Pain in right knee: Secondary | ICD-10-CM

## 2023-02-03 DIAGNOSIS — M25562 Pain in left knee: Secondary | ICD-10-CM
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
## 2023-02-07 DIAGNOSIS — M25511 Pain in right shoulder: Secondary | ICD-10-CM | POA: Diagnosis not present

## 2023-02-07 DIAGNOSIS — M542 Cervicalgia: Secondary | ICD-10-CM | POA: Diagnosis not present

## 2023-02-07 DIAGNOSIS — M5459 Other low back pain: Secondary | ICD-10-CM | POA: Diagnosis not present

## 2023-02-07 DIAGNOSIS — M25562 Pain in left knee: Secondary | ICD-10-CM | POA: Diagnosis not present

## 2023-02-14 DIAGNOSIS — M5459 Other low back pain: Secondary | ICD-10-CM | POA: Diagnosis not present

## 2023-02-14 DIAGNOSIS — M542 Cervicalgia: Secondary | ICD-10-CM | POA: Diagnosis not present

## 2023-02-14 DIAGNOSIS — M25562 Pain in left knee: Secondary | ICD-10-CM | POA: Diagnosis not present

## 2023-02-14 DIAGNOSIS — M25511 Pain in right shoulder: Secondary | ICD-10-CM | POA: Diagnosis not present

## 2023-02-21 DIAGNOSIS — M542 Cervicalgia: Secondary | ICD-10-CM | POA: Diagnosis not present

## 2023-02-21 DIAGNOSIS — M5459 Other low back pain: Secondary | ICD-10-CM | POA: Diagnosis not present

## 2023-02-21 DIAGNOSIS — M25511 Pain in right shoulder: Secondary | ICD-10-CM | POA: Diagnosis not present

## 2023-02-21 DIAGNOSIS — M25562 Pain in left knee: Secondary | ICD-10-CM | POA: Diagnosis not present

## 2023-02-28 DIAGNOSIS — M25511 Pain in right shoulder: Secondary | ICD-10-CM | POA: Diagnosis not present

## 2023-02-28 DIAGNOSIS — M25562 Pain in left knee: Secondary | ICD-10-CM | POA: Diagnosis not present

## 2023-02-28 DIAGNOSIS — M5459 Other low back pain: Secondary | ICD-10-CM | POA: Diagnosis not present

## 2023-02-28 DIAGNOSIS — M542 Cervicalgia: Secondary | ICD-10-CM | POA: Diagnosis not present

## 2023-03-07 DIAGNOSIS — M542 Cervicalgia: Secondary | ICD-10-CM | POA: Diagnosis not present

## 2023-03-07 DIAGNOSIS — M25511 Pain in right shoulder: Secondary | ICD-10-CM | POA: Diagnosis not present

## 2023-03-07 DIAGNOSIS — M5459 Other low back pain: Secondary | ICD-10-CM | POA: Diagnosis not present

## 2023-03-07 DIAGNOSIS — M25562 Pain in left knee: Secondary | ICD-10-CM | POA: Diagnosis not present

## 2023-03-15 DIAGNOSIS — M5459 Other low back pain: Secondary | ICD-10-CM | POA: Diagnosis not present

## 2023-03-15 DIAGNOSIS — M25562 Pain in left knee: Secondary | ICD-10-CM | POA: Diagnosis not present

## 2023-03-15 DIAGNOSIS — M25511 Pain in right shoulder: Secondary | ICD-10-CM | POA: Diagnosis not present

## 2023-03-15 DIAGNOSIS — M542 Cervicalgia: Secondary | ICD-10-CM | POA: Diagnosis not present

## 2023-03-21 DIAGNOSIS — M25562 Pain in left knee: Secondary | ICD-10-CM | POA: Diagnosis not present

## 2023-03-21 DIAGNOSIS — M25511 Pain in right shoulder: Secondary | ICD-10-CM | POA: Diagnosis not present

## 2023-03-21 DIAGNOSIS — M5459 Other low back pain: Secondary | ICD-10-CM | POA: Diagnosis not present

## 2023-03-21 DIAGNOSIS — M542 Cervicalgia: Secondary | ICD-10-CM | POA: Diagnosis not present

## 2023-03-28 DIAGNOSIS — M542 Cervicalgia: Secondary | ICD-10-CM | POA: Diagnosis not present

## 2023-03-28 DIAGNOSIS — M25511 Pain in right shoulder: Secondary | ICD-10-CM | POA: Diagnosis not present

## 2023-03-28 DIAGNOSIS — M5459 Other low back pain: Secondary | ICD-10-CM | POA: Diagnosis not present

## 2023-03-28 DIAGNOSIS — M25562 Pain in left knee: Secondary | ICD-10-CM | POA: Diagnosis not present

## 2023-04-11 ENCOUNTER — Encounter: Payer: Federal, State, Local not specified - PPO | Admitting: Internal Medicine

## 2023-04-18 DIAGNOSIS — M25511 Pain in right shoulder: Secondary | ICD-10-CM | POA: Diagnosis not present

## 2023-04-18 DIAGNOSIS — M25562 Pain in left knee: Secondary | ICD-10-CM | POA: Diagnosis not present

## 2023-04-18 DIAGNOSIS — M5459 Other low back pain: Secondary | ICD-10-CM | POA: Diagnosis not present

## 2023-04-18 DIAGNOSIS — M542 Cervicalgia: Secondary | ICD-10-CM | POA: Diagnosis not present

## 2023-04-22 DIAGNOSIS — R972 Elevated prostate specific antigen [PSA]: Secondary | ICD-10-CM | POA: Diagnosis not present

## 2023-04-29 DIAGNOSIS — M25562 Pain in left knee: Secondary | ICD-10-CM | POA: Diagnosis not present

## 2023-04-29 DIAGNOSIS — M25511 Pain in right shoulder: Secondary | ICD-10-CM | POA: Diagnosis not present

## 2023-04-29 DIAGNOSIS — M542 Cervicalgia: Secondary | ICD-10-CM | POA: Diagnosis not present

## 2023-04-29 DIAGNOSIS — M5459 Other low back pain: Secondary | ICD-10-CM | POA: Diagnosis not present

## 2023-05-02 DIAGNOSIS — M25511 Pain in right shoulder: Secondary | ICD-10-CM | POA: Diagnosis not present

## 2023-05-02 DIAGNOSIS — M25562 Pain in left knee: Secondary | ICD-10-CM | POA: Diagnosis not present

## 2023-05-02 DIAGNOSIS — M5459 Other low back pain: Secondary | ICD-10-CM | POA: Diagnosis not present

## 2023-05-02 DIAGNOSIS — M542 Cervicalgia: Secondary | ICD-10-CM | POA: Diagnosis not present

## 2023-05-06 DIAGNOSIS — R350 Frequency of micturition: Secondary | ICD-10-CM | POA: Diagnosis not present

## 2023-05-06 DIAGNOSIS — R972 Elevated prostate specific antigen [PSA]: Secondary | ICD-10-CM | POA: Diagnosis not present

## 2023-05-09 DIAGNOSIS — M542 Cervicalgia: Secondary | ICD-10-CM | POA: Diagnosis not present

## 2023-05-09 DIAGNOSIS — M25562 Pain in left knee: Secondary | ICD-10-CM | POA: Diagnosis not present

## 2023-05-09 DIAGNOSIS — M5459 Other low back pain: Secondary | ICD-10-CM | POA: Diagnosis not present

## 2023-05-09 DIAGNOSIS — M25511 Pain in right shoulder: Secondary | ICD-10-CM | POA: Diagnosis not present

## 2023-05-13 ENCOUNTER — Ambulatory Visit: Payer: Federal, State, Local not specified - PPO | Admitting: Orthopedic Surgery

## 2023-05-13 DIAGNOSIS — S83242S Other tear of medial meniscus, current injury, left knee, sequela: Secondary | ICD-10-CM

## 2023-05-14 ENCOUNTER — Encounter: Payer: Self-pay | Admitting: Orthopedic Surgery
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
## 2023-05-23 ENCOUNTER — Encounter: Payer: Federal, State, Local not specified - PPO | Admitting: Internal Medicine

## 2023-05-23 DIAGNOSIS — M25562 Pain in left knee: Secondary | ICD-10-CM | POA: Diagnosis not present

## 2023-05-23 DIAGNOSIS — M25511 Pain in right shoulder: Secondary | ICD-10-CM | POA: Diagnosis not present

## 2023-05-23 DIAGNOSIS — M5459 Other low back pain: Secondary | ICD-10-CM | POA: Diagnosis not present

## 2023-05-23 DIAGNOSIS — M542 Cervicalgia: Secondary | ICD-10-CM | POA: Diagnosis not present

## 2023-05-30 DIAGNOSIS — M5459 Other low back pain: Secondary | ICD-10-CM | POA: Diagnosis not present

## 2023-05-30 DIAGNOSIS — M25562 Pain in left knee: Secondary | ICD-10-CM | POA: Diagnosis not present

## 2023-05-30 DIAGNOSIS — M542 Cervicalgia: Secondary | ICD-10-CM | POA: Diagnosis not present

## 2023-05-30 DIAGNOSIS — M25511 Pain in right shoulder: Secondary | ICD-10-CM | POA: Diagnosis not present

## 2023-06-13 DIAGNOSIS — M25511 Pain in right shoulder: Secondary | ICD-10-CM | POA: Diagnosis not present

## 2023-06-13 DIAGNOSIS — M5459 Other low back pain: Secondary | ICD-10-CM | POA: Diagnosis not present

## 2023-06-13 DIAGNOSIS — M542 Cervicalgia: Secondary | ICD-10-CM | POA: Diagnosis not present

## 2023-06-13 DIAGNOSIS — M25562 Pain in left knee: Secondary | ICD-10-CM | POA: Diagnosis not present

## 2023-06-20 DIAGNOSIS — M25511 Pain in right shoulder: Secondary | ICD-10-CM | POA: Diagnosis not present

## 2023-06-20 DIAGNOSIS — M542 Cervicalgia: Secondary | ICD-10-CM | POA: Diagnosis not present

## 2023-06-20 DIAGNOSIS — M5459 Other low back pain: Secondary | ICD-10-CM | POA: Diagnosis not present

## 2023-06-20 DIAGNOSIS — M25562 Pain in left knee: Secondary | ICD-10-CM | POA: Diagnosis not present

## 2023-06-24 DIAGNOSIS — R7303 Prediabetes: Secondary | ICD-10-CM | POA: Diagnosis not present

## 2023-06-24 DIAGNOSIS — M25531 Pain in right wrist: Secondary | ICD-10-CM | POA: Diagnosis not present

## 2023-06-24 DIAGNOSIS — M79602 Pain in left arm: Secondary | ICD-10-CM | POA: Diagnosis not present

## 2023-06-24 DIAGNOSIS — N400 Enlarged prostate without lower urinary tract symptoms: Secondary | ICD-10-CM | POA: Diagnosis not present

## 2023-07-05 DIAGNOSIS — M25511 Pain in right shoulder: Secondary | ICD-10-CM | POA: Diagnosis not present

## 2023-07-05 DIAGNOSIS — M5459 Other low back pain: Secondary | ICD-10-CM | POA: Diagnosis not present

## 2023-07-05 DIAGNOSIS — M25562 Pain in left knee: Secondary | ICD-10-CM | POA: Diagnosis not present

## 2023-07-05 DIAGNOSIS — M542 Cervicalgia: Secondary | ICD-10-CM | POA: Diagnosis not present

## 2023-07-20 ENCOUNTER — Encounter: Payer: Federal, State, Local not specified - PPO | Admitting: Internal Medicine

## 2023-07-25 DIAGNOSIS — M542 Cervicalgia: Secondary | ICD-10-CM | POA: Diagnosis not present

## 2023-07-25 DIAGNOSIS — M5459 Other low back pain: Secondary | ICD-10-CM | POA: Diagnosis not present

## 2023-07-25 DIAGNOSIS — M25511 Pain in right shoulder: Secondary | ICD-10-CM | POA: Diagnosis not present

## 2023-07-25 DIAGNOSIS — M25562 Pain in left knee: Secondary | ICD-10-CM | POA: Diagnosis not present

## 2023-08-03 ENCOUNTER — Encounter: Payer: Federal, State, Local not specified - PPO | Admitting: Internal Medicine

## 2023-08-12 DIAGNOSIS — K219 Gastro-esophageal reflux disease without esophagitis: Secondary | ICD-10-CM | POA: Diagnosis not present

## 2023-08-12 DIAGNOSIS — N1831 Chronic kidney disease, stage 3a: Secondary | ICD-10-CM | POA: Diagnosis not present

## 2023-08-12 DIAGNOSIS — E559 Vitamin D deficiency, unspecified: Secondary | ICD-10-CM | POA: Diagnosis not present

## 2023-08-15 ENCOUNTER — Encounter: Payer: Self-pay | Admitting: Internal Medicine

## 2023-08-15 ENCOUNTER — Ambulatory Visit: Payer: Federal, State, Local not specified - PPO | Admitting: Internal Medicine

## 2023-08-15 VITALS — BP 110/78 | HR 91 | Temp 98.6°F | Ht 70.0 in | Wt 158.0 lb

## 2023-08-15 DIAGNOSIS — L853 Xerosis cutis: Secondary | ICD-10-CM

## 2023-08-15 DIAGNOSIS — Z8249 Family history of ischemic heart disease and other diseases of the circulatory system: Secondary | ICD-10-CM

## 2023-08-15 DIAGNOSIS — N1831 Chronic kidney disease, stage 3a: Secondary | ICD-10-CM

## 2023-08-15 DIAGNOSIS — M23222 Derangement of posterior horn of medial meniscus due to old tear or injury, left knee: Secondary | ICD-10-CM | POA: Insufficient documentation

## 2023-08-15 DIAGNOSIS — N529 Male erectile dysfunction, unspecified: Secondary | ICD-10-CM | POA: Insufficient documentation

## 2023-08-15 DIAGNOSIS — K635 Polyp of colon: Secondary | ICD-10-CM | POA: Diagnosis not present

## 2023-08-15 DIAGNOSIS — N183 Chronic kidney disease, stage 3 unspecified: Secondary | ICD-10-CM | POA: Insufficient documentation

## 2023-08-15 DIAGNOSIS — E559 Vitamin D deficiency, unspecified: Secondary | ICD-10-CM | POA: Diagnosis not present

## 2023-08-15 DIAGNOSIS — M25512 Pain in left shoulder: Secondary | ICD-10-CM

## 2023-08-15 DIAGNOSIS — G8929 Other chronic pain: Secondary | ICD-10-CM

## 2023-08-15 DIAGNOSIS — Z0000 Encounter for general adult medical examination without abnormal findings: Secondary | ICD-10-CM | POA: Diagnosis not present

## 2023-08-15 DIAGNOSIS — E538 Deficiency of other specified B group vitamins: Secondary | ICD-10-CM | POA: Diagnosis not present

## 2023-08-15 DIAGNOSIS — Z86010 Personal history of colonic polyps: Secondary | ICD-10-CM

## 2023-08-15 MED ORDER — TADALAFIL 5 MG PO TABS: 5.0000 mg | ORAL_TABLET | Freq: Every day | ORAL | 3 refills | Status: DC

## 2023-08-15 MED ORDER — METHYLPREDNISOLONE 4 MG PO TBPK: ORAL_TABLET | ORAL | 0 refills | Status: DC

## 2023-08-15 MED ORDER — HYDROXYZINE HCL 25 MG PO TABS: 25.0000 mg | ORAL_TABLET | Freq: Three times a day (TID) | ORAL | 1 refills | Status: DC | PRN

## 2023-08-15 MED ORDER — CLOTRIMAZOLE-BETAMETHASONE 1-0.05 % EX CREA: 1.0000 | TOPICAL_CREAM | Freq: Two times a day (BID) | CUTANEOUS | 1 refills | Status: AC

## 2023-08-15 NOTE — Progress Notes (Unsigned)
Subjective:  Patient ID: Jorge Turner, male    DOB: February 06, 1956  Age: 67 y.o. MRN: [id number]  CC: Shoulder Pain   HPI Jorge Turner presents for a well exam  C/o L shoulder pain w/ROM x 4-5 mo (pt went to [place], Naproxen was given). No injury She is complaining of dry skin and itching -taking two long hot showers a day C/o being off balance at night when getting up to pee x 2 moq1   Outpatient Medications Prior to Visit  Medication Sig Dispense Refill   acetaminophen (TYLENOL) 500 MG tablet Take 500 mg by mouth every 6 (six) hours as needed.     cholecalciferol (VITAMIN D) 1000 UNITS tablet Take 1,000 Units by mouth daily.     Cyanocobalamin (VITAMIN B-12) 1000 MCG SUBL Place 1 tablet (1,000 mcg total) under the tongue daily. 100 tablet 3   famotidine (PEPCID) 40 MG tablet Take 1 tablet (40 mg total) by mouth daily. 90 tablet 3   finasteride (PROSCAR) 5 MG tablet TAKE 1 TABLET BY MOUTH EVERY DAY 90 tablet 3   traMADol (ULTRAM) 50 MG tablet Take 1 tablet (50 mg total) by mouth every 6 (six) hours as needed. 20 tablet 0   celecoxib (CELEBREX) 200 MG capsule Take 1 capsule (200 mg total) by mouth 2 (two) times daily as needed for moderate pain. Annual appt due must see provider for future refills 60 capsule 0   cyclobenzaprine (FLEXERIL) 10 MG tablet Take 1 tablet (10 mg total) by mouth 3 (three) times daily as needed for muscle spasms. 30 tablet 0   diclofenac Sodium (VOLTAREN) 1 % GEL APPLY 4 G TOPICALLY 4 TIMES DAILY 300 g 5   omeprazole (PRILOSEC) 40 MG capsule TAKE 1 CAPSULE BY MOUTH DAILY. ANNUAL APPT DUE IN OCT MUST SEE PROVIDER FOR FUTURE REFILLS 30 capsule 0   sildenafil (VIAGRA) 100 MG tablet Take 0.5-1 tablets (50-100 mg total) by mouth daily as needed for erectile dysfunction. 12 tablet 5   valACYclovir (VALTREX) 1000 MG tablet Take 2t po q12h times 2 doses. Repeat with next fever blister episode. 20 tablet 3   No facility-administered medications prior to visit.    ROS: Review  of Systems  Constitutional:  Negative for appetite change, fatigue and unexpected weight change.  HENT:  Negative for congestion, nosebleeds, sneezing, sore throat and trouble swallowing.   Eyes:  Negative for itching and visual disturbance.  Respiratory:  Negative for cough.   Cardiovascular:  Negative for chest pain, palpitations and leg swelling.  Gastrointestinal:  Negative for abdominal distention, blood in stool, diarrhea and nausea.  Genitourinary:  Negative for frequency and hematuria.  Musculoskeletal:  Negative for arthralgias, back pain, gait problem, joint swelling and neck pain.  Skin:  Negative for rash.  Neurological:  Negative for dizziness, tremors, speech difficulty and weakness.  Psychiatric/Behavioral:  Negative for agitation, dysphoric mood and sleep disturbance. The patient is not nervous/anxious.     Objective:  BP 110/78 (BP Location: Right Arm, Patient Position: Sitting, Cuff Size: Large)   Pulse 91   Temp 98.6 F (37 C) (Oral)   Ht 5\' 10"  (1.778 m)   Wt 158 lb (71.7 kg)   SpO2 95%   BMI 22.67 kg/m   BP Readings from Last 3 Encounters:  08/15/23 110/78  03/08/22 108/72  02/10/22 110/72    Wt Readings from Last 3 Encounters:  08/15/23 158 lb (71.7 kg)  03/08/22 159 lb (72.1 kg)  02/10/22 159 lb 8  oz (72.3 kg)    Physical Exam Constitutional:      General: He is not in acute distress.    Appearance: Normal appearance. He is well-developed.     Comments: NAD  Eyes:     Conjunctiva/sclera: Conjunctivae normal.     Pupils: Pupils are equal, round, and reactive to light.  Neck:     Thyroid: No thyromegaly.     Vascular: No JVD.  Cardiovascular:     Rate and Rhythm: Normal rate and regular rhythm.     Heart sounds: Normal heart sounds. No murmur heard.    No friction rub. No gallop.  Pulmonary:     Effort: Pulmonary effort is normal. No respiratory distress.     Breath sounds: Normal breath sounds. No wheezing or rales.  Chest:     Chest wall:  No tenderness.  Abdominal:     General: Bowel sounds are normal. There is no distension.     Palpations: Abdomen is soft. There is no mass.     Tenderness: There is no abdominal tenderness. There is no guarding or rebound.  Musculoskeletal:        General: No tenderness. Normal range of motion.     Cervical back: Normal range of motion.  Lymphadenopathy:     Cervical: No cervical adenopathy.  Skin:    General: Skin is warm and dry.     Findings: No rash.  Neurological:     Mental Status: He is alert and oriented to person, place, and time.     Cranial Nerves: No cranial nerve deficit.     Motor: No abnormal muscle tone.     Coordination: Coordination normal.     Gait: Gait normal.     Deep Tendon Reflexes: Reflexes are normal and symmetric.  Psychiatric:        Behavior: Behavior normal.        Thought Content: Thought content normal.        Judgment: Judgment normal.   Rectal - per Urology Left shoulder is tender in the subacromial area and over the biceps tendon.  Range of motion is decreased due to pain.   I spent 22 minutes in addition to time for CPX wellness examination in preparing to see the patient by review of recent labs, imaging and procedures, obtaining and reviewing separately obtained history, communicating with the patient, ordering medications, tests or procedures, and documenting clinical information in the EHR including the differential diagnosis, treatment, and any further evaluation and other management of left shoulder pain, dermatitis, family history of coronary disease, colon polyps.         Lab Results  Component Value Date   WBC 3.9 (L) 11/26/2019   HGB 15.4 11/26/2019   HCT 46.3 11/26/2019   PLT 265.0 11/26/2019   GLUCOSE 94 11/26/2019   CHOL 187 02/08/2018   TRIG 92.0 02/08/2018   HDL 55.50 02/08/2018   LDLDIRECT 140.3 10/26/2011   LDLCALC 113 (H) 02/08/2018   ALT 14 10/03/2018   AST 13 10/03/2018   NA 139 11/26/2019   K 3.7 11/26/2019    CL 104 11/26/2019   CREATININE 1.55 (H) 11/26/2019   BUN 23 11/26/2019   CO2 28 11/26/2019   TSH 3.94 02/08/2018   PSA 4.13 (H) 02/08/2018    No results found.  Assessment & Plan:   Problem List Items Addressed This Visit     B12 deficiency    On B12      Relevant Orders   Vitamin  B12   History of colon polyps    Will assist to schedule colonoscopy with Dr. Marina Goodell which is past due.      RESOLVED: Well adult exam - Primary    We discussed age appropriate health related issues, including available/recomended screening tests and vaccinations. Labs were ordered to be later reviewed . All questions were answered. We discussed one or more of the following - seat belt use, use of sunscreen/sun exposure exercise, fall risk reduction, second hand smoke exposure, firearm use and storage, seat belt use, a need for adhering to healthy diet and exercise. Labs were ordered.  All questions were answered. Cor calcium CT offered 10/22 Colon 2018 Dr Marina Goodell. Due in 2023      Relevant Orders   Lipid panel   Comprehensive metabolic panel   PSA   Vitamin B12   VITAMIN D 25 Hydroxy (Vit-D Deficiency, Fractures)   Family history of early CAD    A cardiac CT scan for coronary calcium test offered and ordered      Relevant Orders   CT CARDIAC SCORING (SELF PAY ONLY)   Vitamin D deficiency   Relevant Orders   VITAMIN D 25 Hydroxy (Vit-D Deficiency, Fractures)   Stage 3 chronic kidney disease (HCC)    Stage 3a per [facility] Hydrate well      Shoulder pain, left    Blue-Emu cream was recommended to use 2-3 times a day Medrol pack Heat Injection was offered and declined       Dry skin dermatitis    Reduce showering to once a day.  Take cooler and shorter showers.  Triamcinolone cream as needed hydroxyzine as needed      Other Visit Diagnoses     Polyp of colon, unspecified part of colon, unspecified type       Relevant Orders   Ambulatory referral to Gastroenterology          Meds ordered this encounter  Medications   tadalafil (CIALIS) 5 MG tablet    Sig: Take 1 tablet (5 mg total) by mouth daily.    Dispense:  90 tablet    Refill:  3   clotrimazole-betamethasone (LOTRISONE) cream    Sig: Apply 1 Application topically 2 (two) times daily. Itching, rash    Dispense:  90 g    Refill:  1   methylPREDNISolone (MEDROL DOSEPAK) 4 MG TBPK tablet    Sig: As directed    Dispense:  21 tablet    Refill:  0   hydrOXYzine (ATARAX) 25 MG tablet    Sig: Take 1 tablet (25 mg total) by mouth every 8 (eight) hours as needed for itching.    Dispense:  60 tablet    Refill:  1      Follow-up: Return in about 3 months (around 11/14/2023) for a follow-up visit.  Sonda Primes, MD

## 2023-08-15 NOTE — Assessment & Plan Note (Signed)
We discussed age appropriate health related issues, including available/recomended screening tests and vaccinations. Labs were ordered to be later reviewed . All questions were answered. We discussed one or more of the following - seat belt use, use of sunscreen/sun exposure exercise, fall risk reduction, second hand smoke exposure, firearm use and storage, seat belt use, a need for adhering to healthy diet and exercise. Labs were ordered.  All questions were answered. Cor calcium CT offered 10/22 Colon 2018 Dr Marina Goodell. Due in 2023

## 2023-08-15 NOTE — Assessment & Plan Note (Signed)
A cardiac CT scan for coronary calcium test offered

## 2023-08-15 NOTE — Assessment & Plan Note (Signed)
On B12 

## 2023-08-15 NOTE — Patient Instructions (Signed)
Blue-Emu cream -- use 2-3 times a day ? ?

## 2023-08-15 NOTE — Assessment & Plan Note (Signed)
Blue-Emu cream was recommended to use 2-3 times a day Medrol pack Heat Injection was offered and declined

## 2023-08-15 NOTE — Assessment & Plan Note (Signed)
Stage 3a per [facility] Hydrate well

## 2023-08-16 DIAGNOSIS — L853 Xerosis cutis: Secondary | ICD-10-CM | POA: Insufficient documentation

## 2023-08-16 LAB — COMPREHENSIVE METABOLIC PANEL
ALT: 11 U/L (ref 0–53)
AST: 12 U/L (ref 0–37)
Albumin: 4.2 g/dL (ref 3.5–5.2)
Alkaline Phosphatase: 84 U/L (ref 39–117)
BUN: 33 mg/dL — ABNORMAL HIGH (ref 6–23)
CO2: 26 meq/L (ref 19–32)
Calcium: 9.5 mg/dL (ref 8.4–10.5)
Chloride: 103 meq/L (ref 96–112)
Creatinine, Ser: 1.67 mg/dL — ABNORMAL HIGH (ref 0.40–1.50)
GFR: 42.38 mL/min — ABNORMAL LOW (ref >60.00)
Glucose, Bld: 96 mg/dL (ref 70–99)
Potassium: 4.4 meq/L (ref 3.5–5.1)
Sodium: 139 meq/L (ref 135–145)
Total Bilirubin: 1 mg/dL (ref 0.2–1.2)
Total Protein: 7 g/dL (ref 6.0–8.3)

## 2023-08-16 LAB — LIPID PANEL
Cholesterol: 255 mg/dL — ABNORMAL HIGH (ref 0–200)
HDL: 71.1 mg/dL (ref >39.00)
LDL Cholesterol: 163 mg/dL — ABNORMAL HIGH (ref 0–99)
NonHDL: 183.42
Total CHOL/HDL Ratio: 4
Triglycerides: 104 mg/dL (ref 0.0–149.0)
VLDL: 20.8 mg/dL (ref 0.0–40.0)

## 2023-08-16 LAB — VITAMIN D 25 HYDROXY (VIT D DEFICIENCY, FRACTURES): VITD: 46.2 ng/mL (ref 30.00–100.00)

## 2023-08-16 LAB — VITAMIN B12: Vitamin B-12: >1501 pg/mL — ABNORMAL HIGH (ref 211–911)

## 2023-08-16 LAB — PSA: PSA: 13.16 ng/mL — ABNORMAL HIGH (ref 0.10–4.00)

## 2023-08-16 NOTE — Assessment & Plan Note (Signed)
Will assist to schedule colonoscopy with Dr. Marina Goodell which is past due.

## 2023-08-16 NOTE — Assessment & Plan Note (Signed)
Reduce showering to once a day.  Take cooler and shorter showers.  Triamcinolone cream as needed hydroxyzine as needed

## 2023-08-17 DIAGNOSIS — M25511 Pain in right shoulder: Secondary | ICD-10-CM | POA: Diagnosis not present

## 2023-08-17 DIAGNOSIS — M25562 Pain in left knee: Secondary | ICD-10-CM | POA: Diagnosis not present

## 2023-08-17 DIAGNOSIS — M5459 Other low back pain: Secondary | ICD-10-CM | POA: Diagnosis not present

## 2023-08-17 DIAGNOSIS — M542 Cervicalgia: Secondary | ICD-10-CM | POA: Diagnosis not present

## 2023-08-22 DIAGNOSIS — M25562 Pain in left knee: Secondary | ICD-10-CM | POA: Diagnosis not present

## 2023-08-22 DIAGNOSIS — M5459 Other low back pain: Secondary | ICD-10-CM | POA: Diagnosis not present

## 2023-08-22 DIAGNOSIS — M25511 Pain in right shoulder: Secondary | ICD-10-CM | POA: Diagnosis not present

## 2023-08-22 DIAGNOSIS — M542 Cervicalgia: Secondary | ICD-10-CM | POA: Diagnosis not present

## 2023-08-29 ENCOUNTER — Other Ambulatory Visit (HOSPITAL_COMMUNITY): Payer: Self-pay

## 2023-08-29 ENCOUNTER — Telehealth: Payer: Self-pay

## 2023-08-29 DIAGNOSIS — M542 Cervicalgia: Secondary | ICD-10-CM | POA: Diagnosis not present

## 2023-08-29 DIAGNOSIS — M25511 Pain in right shoulder: Secondary | ICD-10-CM | POA: Diagnosis not present

## 2023-08-29 DIAGNOSIS — M25562 Pain in left knee: Secondary | ICD-10-CM | POA: Diagnosis not present

## 2023-08-29 DIAGNOSIS — M5459 Other low back pain: Secondary | ICD-10-CM | POA: Diagnosis not present

## 2023-08-29 NOTE — Telephone Encounter (Signed)
Pharmacy Patient Advocate Encounter   Received notification from Physician's Office that prior authorization for Tadalafil is required/requested.   Insurance verification completed.   The patient is insured through [employer] [facility] .   Per test claim: PA required; PA submitted to [facility] [facility] via CoverMyMeds Key/confirmation #/EOC Key: [id number]    Status is pending

## 2023-08-31 ENCOUNTER — Other Ambulatory Visit (HOSPITAL_COMMUNITY): Payer: Self-pay

## 2023-08-31 NOTE — Telephone Encounter (Signed)
Pharmacy Patient Advocate Encounter  Received notification from [facility] that Prior Authorization for Tadalafil has been APPROVED from 9.30.24 to 9.30.25. Ran test claim, Copay is $22.50. This test claim was processed through [facility]- copay amounts may vary at other pharmacies due to pharmacy/plan contracts, or as the patient moves through the different stages of their insurance plan.   PA #/Case ID/Reference #: (Key: [id number])

## 2023-09-05 DIAGNOSIS — M25562 Pain in left knee: Secondary | ICD-10-CM | POA: Diagnosis not present

## 2023-09-05 DIAGNOSIS — M5459 Other low back pain: Secondary | ICD-10-CM | POA: Diagnosis not present

## 2023-09-05 DIAGNOSIS — M25511 Pain in right shoulder: Secondary | ICD-10-CM | POA: Diagnosis not present

## 2023-09-05 DIAGNOSIS — M542 Cervicalgia: Secondary | ICD-10-CM | POA: Diagnosis not present

## 2023-09-09 ENCOUNTER — Ambulatory Visit
Admission: RE | Admit: 2023-09-09 | Discharge: 2023-09-09 | Disposition: A | Discharge status: Home / Self Care | Payer: Federal, State, Local not specified - PPO | Source: Ambulatory Visit | Attending: Internal Medicine | Admitting: Internal Medicine

## 2023-09-09 DIAGNOSIS — E785 Hyperlipidemia, unspecified: Secondary | ICD-10-CM | POA: Diagnosis not present

## 2023-09-09 DIAGNOSIS — Z8249 Family history of ischemic heart disease and other diseases of the circulatory system: Secondary | ICD-10-CM

## 2023-09-12 ENCOUNTER — Ambulatory Visit: Payer: Federal, State, Local not specified - PPO | Admitting: Orthopedic Surgery

## 2023-09-12 DIAGNOSIS — M25562 Pain in left knee: Secondary | ICD-10-CM | POA: Diagnosis not present

## 2023-09-12 DIAGNOSIS — M542 Cervicalgia: Secondary | ICD-10-CM | POA: Diagnosis not present

## 2023-09-12 DIAGNOSIS — M25511 Pain in right shoulder: Secondary | ICD-10-CM | POA: Diagnosis not present

## 2023-09-12 DIAGNOSIS — M5459 Other low back pain: Secondary | ICD-10-CM | POA: Diagnosis not present

## 2023-09-19 DIAGNOSIS — M542 Cervicalgia: Secondary | ICD-10-CM | POA: Diagnosis not present

## 2023-09-19 DIAGNOSIS — M25511 Pain in right shoulder: Secondary | ICD-10-CM | POA: Diagnosis not present

## 2023-09-19 DIAGNOSIS — M25562 Pain in left knee: Secondary | ICD-10-CM | POA: Diagnosis not present

## 2023-09-19 DIAGNOSIS — M5459 Other low back pain: Secondary | ICD-10-CM | POA: Diagnosis not present

## 2023-09-22 DIAGNOSIS — N1832 Chronic kidney disease, stage 3b: Secondary | ICD-10-CM | POA: Diagnosis not present

## 2023-09-22 DIAGNOSIS — M79602 Pain in left arm: Secondary | ICD-10-CM | POA: Diagnosis not present

## 2023-09-22 DIAGNOSIS — K219 Gastro-esophageal reflux disease without esophagitis: Secondary | ICD-10-CM | POA: Diagnosis not present

## 2023-09-22 DIAGNOSIS — E559 Vitamin D deficiency, unspecified: Secondary | ICD-10-CM | POA: Diagnosis not present

## 2023-09-22 DIAGNOSIS — M25531 Pain in right wrist: Secondary | ICD-10-CM | POA: Diagnosis not present

## 2023-09-22 DIAGNOSIS — R7303 Prediabetes: Secondary | ICD-10-CM | POA: Diagnosis not present

## 2023-09-22 DIAGNOSIS — N1831 Chronic kidney disease, stage 3a: Secondary | ICD-10-CM | POA: Diagnosis not present

## 2023-09-26 ENCOUNTER — Ambulatory Visit: Payer: Federal, State, Local not specified - PPO | Admitting: Orthopedic Surgery

## 2023-09-26 ENCOUNTER — Other Ambulatory Visit (INDEPENDENT_AMBULATORY_CARE_PROVIDER_SITE_OTHER): Payer: Federal, State, Local not specified - PPO

## 2023-09-26 ENCOUNTER — Other Ambulatory Visit (INDEPENDENT_AMBULATORY_CARE_PROVIDER_SITE_OTHER): Payer: Self-pay

## 2023-09-26 DIAGNOSIS — M7552 Bursitis of left shoulder: Secondary | ICD-10-CM

## 2023-09-26 DIAGNOSIS — M25562 Pain in left knee: Secondary | ICD-10-CM | POA: Diagnosis not present

## 2023-09-26 DIAGNOSIS — M542 Cervicalgia: Secondary | ICD-10-CM | POA: Diagnosis not present

## 2023-09-26 DIAGNOSIS — M79602 Pain in left arm: Secondary | ICD-10-CM

## 2023-09-26 DIAGNOSIS — M5459 Other low back pain: Secondary | ICD-10-CM | POA: Diagnosis not present

## 2023-09-26 DIAGNOSIS — M25511 Pain in right shoulder: Secondary | ICD-10-CM | POA: Diagnosis not present

## 2023-09-27 ENCOUNTER — Encounter: Payer: Self-pay | Admitting: Orthopedic Surgery

## 2023-10-02 MED ORDER — BUPIVACAINE HCL 0.5 % IJ SOLN
9.0000 mL | INTRAMUSCULAR | Status: AC | PRN
Start: 2023-09-26 — End: 2023-09-26
  Administered 2023-09-26: 9 mL via INTRA_ARTICULAR

## 2023-10-02 MED ORDER — LIDOCAINE HCL 1 % IJ SOLN
5.0000 mL | INTRAMUSCULAR | Status: AC | PRN
Start: 2023-09-26 — End: 2023-09-26
  Administered 2023-09-26: 5 mL

## 2023-10-02 MED ORDER — METHYLPREDNISOLONE ACETATE 40 MG/ML IJ SUSP
40.0000 mg | INTRAMUSCULAR | Status: AC | PRN
Start: 2023-09-26 — End: 2023-09-26
  Administered 2023-09-26: 40 mg via INTRA_ARTICULAR

## 2023-10-03 DIAGNOSIS — M25562 Pain in left knee: Secondary | ICD-10-CM | POA: Diagnosis not present

## 2023-10-03 DIAGNOSIS — M5459 Other low back pain: Secondary | ICD-10-CM | POA: Diagnosis not present

## 2023-10-03 DIAGNOSIS — M25511 Pain in right shoulder: Secondary | ICD-10-CM | POA: Diagnosis not present

## 2023-10-03 DIAGNOSIS — M542 Cervicalgia: Secondary | ICD-10-CM | POA: Diagnosis not present

## 2023-10-10 DIAGNOSIS — M542 Cervicalgia: Secondary | ICD-10-CM | POA: Diagnosis not present

## 2023-10-10 DIAGNOSIS — M25562 Pain in left knee: Secondary | ICD-10-CM | POA: Diagnosis not present

## 2023-10-10 DIAGNOSIS — M25511 Pain in right shoulder: Secondary | ICD-10-CM | POA: Diagnosis not present

## 2023-10-10 DIAGNOSIS — M5459 Other low back pain: Secondary | ICD-10-CM | POA: Diagnosis not present

## 2023-10-17 DIAGNOSIS — M25511 Pain in right shoulder: Secondary | ICD-10-CM | POA: Diagnosis not present

## 2023-10-17 DIAGNOSIS — M25562 Pain in left knee: Secondary | ICD-10-CM | POA: Diagnosis not present

## 2023-10-17 DIAGNOSIS — M5459 Other low back pain: Secondary | ICD-10-CM | POA: Diagnosis not present

## 2023-10-17 DIAGNOSIS — M542 Cervicalgia: Secondary | ICD-10-CM | POA: Diagnosis not present

## 2023-10-18 ENCOUNTER — Encounter: Payer: Self-pay | Admitting: Internal Medicine

## 2023-10-18 ENCOUNTER — Ambulatory Visit: Payer: Federal, State, Local not specified - PPO | Admitting: Internal Medicine

## 2023-10-18 VITALS — BP 104/72 | HR 78 | Temp 98.4°F | Ht 70.0 in | Wt 158.0 lb

## 2023-10-18 DIAGNOSIS — R42 Dizziness and giddiness: Secondary | ICD-10-CM

## 2023-10-18 MED ORDER — MECLIZINE HCL 12.5 MG PO TABS: 12.5000 mg | ORAL_TABLET | Freq: Three times a day (TID) | ORAL | 0 refills | Status: AC | PRN

## 2023-10-18 NOTE — Progress Notes (Signed)
Subjective:    Patient ID: Jorge Turner, male    DOB: [birth year], 67 y.o.   MRN: [id number]      HPI Jorge Turner is here for  Chief Complaint  Patient presents with   Dizziness    ?  Vertigo x 2 days - started yesterday morning.  He was laying in bed and there was no spinning but he was in a daze.  When he got up he felt off balanced.  Yesterday it improved as he moved around more - it went away for a little bit.  Last night he went to bed it was the same - feeling off balanced and walking on air - in a daze.    He had vertigo years ago.  It was true spinning then - no spinning now.    No recent vaccines or colds.   Medications and allergies reviewed with patient and updated if appropriate.  Current Outpatient Medications on File Prior to Visit  Medication Sig Dispense Refill   acetaminophen (TYLENOL) 500 MG tablet Take 500 mg by mouth every 6 (six) hours as needed.     cholecalciferol (VITAMIN D) 1000 UNITS tablet Take 1,000 Units by mouth daily.     clotrimazole-betamethasone (LOTRISONE) cream Apply 1 Application topically 2 (two) times daily. Itching, rash 90 g 1   Cyanocobalamin (VITAMIN B-12) 1000 MCG SUBL Place 1 tablet (1,000 mcg total) under the tongue daily. 100 tablet 3   famotidine (PEPCID) 40 MG tablet Take 1 tablet (40 mg total) by mouth daily. 90 tablet 3   finasteride (PROSCAR) 5 MG tablet TAKE 1 TABLET BY MOUTH EVERY DAY 90 tablet 3   hydrOXYzine (ATARAX) 25 MG tablet Take 1 tablet (25 mg total) by mouth every 8 (eight) hours as needed for itching. 60 tablet 1   methylPREDNISolone (MEDROL DOSEPAK) 4 MG TBPK tablet As directed 21 tablet 0   tadalafil (CIALIS) 5 MG tablet Take 1 tablet (5 mg total) by mouth daily. 90 tablet 3   traMADol (ULTRAM) 50 MG tablet Take 1 tablet (50 mg total) by mouth every 6 (six) hours as needed. 20 tablet 0   No current facility-administered medications on file prior to visit.    Review of Systems  Constitutional:  Negative for chills  and fever.  HENT:  Negative for congestion, ear pain, sinus pain and sore throat.   Eyes:  Negative for visual disturbance.  Respiratory:  Negative for cough, shortness of breath and wheezing.   Cardiovascular:  Negative for chest pain and palpitations.  Gastrointestinal:  Negative for nausea.  Neurological:  Negative for weakness, numbness and headaches.       Objective:   Vitals:   10/18/23 1327  BP: 104/72  Pulse: 78  Temp: 98.4 F (36.9 C)  SpO2: 96%   BP Readings from Last 3 Encounters:  10/18/23 104/72  08/15/23 110/78  03/08/22 108/72   Wt Readings from Last 3 Encounters:  10/18/23 158 lb (71.7 kg)  08/15/23 158 lb (71.7 kg)  03/08/22 159 lb (72.1 kg)   Body mass index is 22.67 kg/m.    Physical Exam Constitutional:      General: He is not in acute distress.    Appearance: Normal appearance. He is not ill-appearing.  HENT:     Head: Normocephalic.     Right Ear: Tympanic membrane, ear canal and external ear normal. There is no impacted cerumen.     Left Ear: Tympanic membrane, ear canal and external ear normal.  There is no impacted cerumen.     Mouth/Throat:     Mouth: Mucous membranes are moist.     Pharynx: No oropharyngeal exudate or posterior oropharyngeal erythema.  Eyes:     Conjunctiva/sclera: Conjunctivae normal.  Cardiovascular:     Rate and Rhythm: Normal rate and regular rhythm.  Pulmonary:     Effort: Pulmonary effort is normal. No respiratory distress.     Breath sounds: Normal breath sounds. No wheezing or rales.  Musculoskeletal:     Cervical back: Neck supple. No tenderness.  Lymphadenopathy:     Cervical: No cervical adenopathy.  Skin:    General: Skin is warm and dry.     Findings: No rash.  Neurological:     General: No focal deficit present.     Mental Status: He is alert.     Cranial Nerves: No cranial nerve deficit.     Sensory: No sensory deficit.     Motor: No weakness.            Assessment & Plan:     Vertigo: Acute Symptoms c/w vertigo - but no spinning No infection, neurological deficits, recent vaccines - other cause Likely BPPV or labyrinthitis Meclizine 12.5 - 25 mg tid prn Stay hydrated Call if symptoms do not improve

## 2023-10-18 NOTE — Patient Instructions (Addendum)
     Medications changes include :   meclizine 12.5 - 25 mg three times a day as needed for vertigo    Return if symptoms worsen or fail to improve.    Vertigo Vertigo is the feeling that you or the things around you are moving or spinning when they're not. It's different than feeling dizzy. It can also cause: Loss of balance. Trouble standing or walking. Nausea and vomiting. This feeling can come and go at any time. It can last from a few seconds to minutes or even hours. It may go away on its own or be treated with medicine. What are the types of vertigo? There are two types of vertigo: Peripheral vertigo happens when parts of your inner ear don't work like they should. This is the more common type. Central vertigo happens when your brain and spinal cord don't work like they should. Your health care provider will do tests to find out what kind of vertigo you have. This will help them decide on the right treatment for you. Follow these instructions at home: Eating and drinking Drink enough fluid to keep your pee (urine) pale yellow. Do not drink alcohol. Activity When you get up in the morning, first sit up on the side of the bed. When you feel okay, stand slowly while holding onto something. Move slowly. Avoid sudden body or head movements. Avoid certain positions, as told by your provider. Use a cane if you have trouble standing or walking. Sit down right away if you feel unsteady. Place items in your home so they're easy for you to reach without bending or leaning over. Return to normal activities when you're told. Ask what things are safe for you to do. General instructions Take your medicines only as told by your provider. Contact a health care provider if: Your medicines don't help or make your vertigo worse. You get new symptoms. You have a fever. You have nausea or vomiting. Your family or friends spot any changes in how you're acting. A part of your body goes  numb. You feel tingling and prickling in a part of your body. You get very bad headaches. Get help right away if: You're always dizzy or you faint. You have a stiff neck. You have trouble moving or speaking. Your hands, arms, or legs feel weak. Your hearing or eyesight changes. These symptoms may be an emergency. Call 911 right away. Do not wait to see if the symptoms will go away. Do not drive yourself to the hospital. This information is not intended to replace advice given to you by your health care provider. Make sure you discuss any questions you have with your health care provider. Document Revised: 02/18/2023 Document Reviewed: 02/18/2023 Elsevier Patient Education  2024 [employer].

## 2023-10-21 ENCOUNTER — Ambulatory Visit: Payer: Federal, State, Local not specified - PPO | Admitting: Orthopedic Surgery

## 2023-10-31 DIAGNOSIS — M25562 Pain in left knee: Secondary | ICD-10-CM | POA: Diagnosis not present

## 2023-10-31 DIAGNOSIS — M25511 Pain in right shoulder: Secondary | ICD-10-CM | POA: Diagnosis not present

## 2023-10-31 DIAGNOSIS — M5459 Other low back pain: Secondary | ICD-10-CM | POA: Diagnosis not present

## 2023-10-31 DIAGNOSIS — M542 Cervicalgia: Secondary | ICD-10-CM | POA: Diagnosis not present

## 2023-11-05 ENCOUNTER — Ambulatory Visit
Admission: RE | Admit: 2023-11-05 | Discharge: 2023-11-05 | Disposition: A | Discharge status: Home / Self Care | Payer: Federal, State, Local not specified - PPO | Source: Ambulatory Visit | Attending: Orthopedic Surgery | Admitting: Orthopedic Surgery

## 2023-11-05 DIAGNOSIS — M50223 Other cervical disc displacement at C6-C7 level: Secondary | ICD-10-CM | POA: Diagnosis not present

## 2023-11-05 DIAGNOSIS — M79602 Pain in left arm: Secondary | ICD-10-CM

## 2023-11-05 DIAGNOSIS — M50221 Other cervical disc displacement at C4-C5 level: Secondary | ICD-10-CM | POA: Diagnosis not present

## 2023-11-05 DIAGNOSIS — M50222 Other cervical disc displacement at C5-C6 level: Secondary | ICD-10-CM | POA: Diagnosis not present

## 2023-11-05 DIAGNOSIS — M5023 Other cervical disc displacement, cervicothoracic region: Secondary | ICD-10-CM | POA: Diagnosis not present

## 2023-11-14 ENCOUNTER — Ambulatory Visit: Payer: Federal, State, Local not specified - PPO | Admitting: Internal Medicine

## 2023-11-21 DIAGNOSIS — M25511 Pain in right shoulder: Secondary | ICD-10-CM | POA: Diagnosis not present

## 2023-11-21 DIAGNOSIS — M5459 Other low back pain: Secondary | ICD-10-CM | POA: Diagnosis not present

## 2023-11-21 DIAGNOSIS — M25562 Pain in left knee: Secondary | ICD-10-CM | POA: Diagnosis not present

## 2023-11-21 DIAGNOSIS — M542 Cervicalgia: Secondary | ICD-10-CM | POA: Diagnosis not present

## 2023-11-29 ENCOUNTER — Ambulatory Visit: Payer: Federal, State, Local not specified - PPO | Admitting: Internal Medicine

## 2023-11-29 ENCOUNTER — Encounter: Payer: Self-pay | Admitting: Internal Medicine

## 2023-11-29 VITALS — BP 112/74 | HR 58 | Temp 97.9°F | Ht 70.0 in | Wt 160.6 lb

## 2023-11-29 DIAGNOSIS — L723 Sebaceous cyst: Secondary | ICD-10-CM | POA: Diagnosis not present

## 2023-11-29 DIAGNOSIS — M2550 Pain in unspecified joint: Secondary | ICD-10-CM | POA: Diagnosis not present

## 2023-11-29 DIAGNOSIS — E538 Deficiency of other specified B group vitamins: Secondary | ICD-10-CM

## 2023-11-29 MED ORDER — CEPHALEXIN 500 MG PO CAPS
500.0000 mg | ORAL_CAPSULE | Freq: Four times a day (QID) | ORAL | 1 refills | Status: DC
Start: 2023-11-29 — End: 2024-02-14

## 2023-11-29 NOTE — Progress Notes (Signed)
 Subjective:  Patient ID: Jorge Turner, male    DOB: October 03, 1956  Age: 67 y.o. MRN: [id number]  CC: Cyst (On right side of head and front left temporal. Patient states it was bigger but was given some cream and it has gone down. )   HPI Jorge Turner presents for cysts on scalp, occasionally with pain and discharge off cream cheeselike material.  Outpatient Medications Prior to Visit  Medication Sig Dispense Refill   acetaminophen  (TYLENOL ) 500 MG tablet Take 500 mg by mouth every 6 (six) hours as needed.     cholecalciferol (VITAMIN D ) 1000 UNITS tablet Take 1,000 Units by mouth daily.     clotrimazole -betamethasone  (LOTRISONE ) cream Apply 1 Application topically 2 (two) times daily. Itching, rash 90 g 1   Cyanocobalamin  (VITAMIN B-12) 1000 MCG SUBL Place 1 tablet (1,000 mcg total) under the tongue daily. 100 tablet 3   famotidine  (PEPCID ) 40 MG tablet Take 1 tablet (40 mg total) by mouth daily. 90 tablet 3   finasteride  (PROSCAR ) 5 MG tablet TAKE 1 TABLET BY MOUTH EVERY DAY 90 tablet 3   hydrOXYzine  (ATARAX ) 25 MG tablet Take 1 tablet (25 mg total) by mouth every 8 (eight) hours as needed for itching. 60 tablet 1   meclizine  (ANTIVERT ) 12.5 MG tablet Take 1-2 tablets (12.5-25 mg total) by mouth 3 (three) times daily as needed for dizziness. 40 tablet 0   tadalafil  (CIALIS ) 5 MG tablet Take 1 tablet (5 mg total) by mouth daily. 90 tablet 3   traMADol  (ULTRAM ) 50 MG tablet Take 1 tablet (50 mg total) by mouth every 6 (six) hours as needed. 20 tablet 0   methylPREDNISolone  (MEDROL  DOSEPAK) 4 MG TBPK tablet As directed (Patient not taking: Reported on 11/29/2023) 21 tablet 0   No facility-administered medications prior to visit.    ROS: Review of Systems  Constitutional:  Negative for appetite change, fatigue and unexpected weight change.  HENT:  Negative for congestion, nosebleeds, sneezing, sore throat and trouble swallowing.   Eyes:  Negative for itching and visual disturbance.   Respiratory:  Negative for cough.   Cardiovascular:  Negative for chest pain, palpitations and leg swelling.  Gastrointestinal:  Negative for abdominal distention, blood in stool, diarrhea and nausea.  Genitourinary:  Negative for frequency and hematuria.  Musculoskeletal:  Positive for arthralgias and neck pain. Negative for back pain, gait problem and joint swelling.  Skin:  Positive for color change. Negative for rash.  Neurological:  Negative for dizziness, tremors, speech difficulty and weakness.  Psychiatric/Behavioral:  Negative for agitation, dysphoric mood and sleep disturbance. The patient is not nervous/anxious.     Objective:  BP 112/74 (BP Location: Left Arm, Patient Position: Sitting, Cuff Size: Normal)   Pulse (!) 58   Temp 97.9 F (36.6 C) (Oral)   Ht 5' 10 (1.778 m)   Wt 160 lb 9.6 oz (72.8 kg)   SpO2 97%   BMI 23.04 kg/m   BP Readings from Last 3 Encounters:  11/29/23 112/74  10/18/23 104/72  08/15/23 110/78    Wt Readings from Last 3 Encounters:  11/29/23 160 lb 9.6 oz (72.8 kg)  10/18/23 158 lb (71.7 kg)  08/15/23 158 lb (71.7 kg)    Physical Exam Constitutional:      General: He is not in acute distress.    Appearance: He is well-developed.     Comments: NAD  Eyes:     Conjunctiva/sclera: Conjunctivae normal.     Pupils: Pupils are equal,  round, and reactive to light.  Neck:     Thyroid : No thyromegaly.     Vascular: No JVD.  Cardiovascular:     Rate and Rhythm: Normal rate and regular rhythm.     Heart sounds: Normal heart sounds. No murmur heard.    No friction rub. No gallop.  Pulmonary:     Effort: Pulmonary effort is normal. No respiratory distress.     Breath sounds: Normal breath sounds. No wheezing or rales.  Chest:     Chest wall: No tenderness.  Abdominal:     General: Bowel sounds are normal. There is no distension.     Palpations: Abdomen is soft. There is no mass.     Tenderness: There is no abdominal tenderness. There is no  guarding or rebound.  Musculoskeletal:        General: No tenderness. Normal range of motion.     Cervical back: Normal range of motion.  Lymphadenopathy:     Cervical: No cervical adenopathy.  Skin:    General: Skin is warm and dry.     Findings: Lesion present. No rash.  Neurological:     Mental Status: He is alert and oriented to person, place, and time.     Cranial Nerves: No cranial nerve deficit.     Motor: No abnormal muscle tone.     Coordination: Coordination normal.     Gait: Gait normal.     Deep Tendon Reflexes: Reflexes are normal and symmetric.  Psychiatric:        Behavior: Behavior normal.        Thought Content: Thought content normal.        Judgment: Judgment normal.   There is a small cyst on the left upper forehead There is one blackhead on the right upper forehead There is a 2 x 1-1/2 cyst versus infiltrate on the scalp right above the right ear All lesions are nontender.  No discharge.  Lab Results  Component Value Date   WBC 3.9 (L) 11/26/2019   HGB 15.4 11/26/2019   HCT 46.3 11/26/2019   PLT 265.0 11/26/2019   GLUCOSE 96 08/16/2023   CHOL 255 (H) 08/16/2023   TRIG 104.0 08/16/2023   HDL 71.10 08/16/2023   LDLDIRECT 140.3 10/26/2011   LDLCALC 163 (H) 08/16/2023   ALT 11 08/16/2023   AST 12 08/16/2023   NA 139 08/16/2023   K 4.4 08/16/2023   CL 103 08/16/2023   CREATININE 1.67 (H) 08/16/2023   BUN 33 (H) 08/16/2023   CO2 26 08/16/2023   TSH 3.94 02/08/2018   PSA 13.16 (H) 08/16/2023    MR Cervical Spine w/o contrast Result Date: 11/18/2023 CLINICAL DATA:  Neck pain with left arm pain extending to the elbow for 6-8 months. EXAM: MRI CERVICAL SPINE WITHOUT CONTRAST TECHNIQUE: Multiplanar, multisequence MR imaging of the cervical spine was performed. No intravenous contrast was administered. COMPARISON:  07/09/2019 FINDINGS: Alignment: Physiologic. Vertebrae: No acute fracture, evidence of discitis, or aggressive bone lesion. Cord: Normal signal  and morphology. Posterior Fossa, vertebral arteries, paraspinal tissues: Posterior fossa demonstrates no focal abnormality. Vertebral artery flow voids are maintained. Paraspinal soft tissues are unremarkable. Disc levels: Discs: Severe disc height loss at C4-5 and C5-6. C2-3: No significant disc bulge. No neural foraminal stenosis. No central canal stenosis. C3-4: Mild disc bulge. Moderate right and mild-moderate left foraminal stenosis. No central canal stenosis. C4-5: Mild disc bulge. Bilateral uncovertebral degenerative changes. Severe bilateral foraminal stenosis. Moderate central canal stenosis. C5-6: Mild disc  osteophyte complex. Bilateral uncovertebral degenerative changes. Severe bilateral foraminal stenosis. Moderate central canal stenosis. C6-7: Mild disc osteophyte complex. Bilateral uncovertebral degenerative changes. Severe bilateral foraminal stenosis. Mild central canal stenosis. C7-T1: Mild disc bulge. Moderate right foraminal stenosis and mild left foraminal stenosis. No central canal stenosis. IMPRESSION: 1. At C4-5 there is a mild disc bulge. Bilateral uncovertebral degenerative changes. Severe bilateral foraminal stenosis. Moderate central canal stenosis. 2. At C5-6 there is a mild disc osteophyte complex. Bilateral uncovertebral degenerative changes. Severe bilateral foraminal stenosis. Moderate central canal stenosis. 3. At C6-7 there is a mild disc osteophyte complex. Bilateral uncovertebral degenerative changes. Severe bilateral foraminal stenosis. Mild central canal stenosis. 4. At C7-T1 there is a mild disc bulge. Moderate right foraminal stenosis and mild left foraminal stenosis. 5. No acute osseous injury of the cervical spine. Electronically Signed   By: Julaine Blanch M.D.   On: 11/18/2023 13:11    Assessment & Plan:   Problem List Items Addressed This Visit     B12 deficiency   On B12      SEBACEOUS CYST, INFECTED - Primary   On scalp x 2 There is a small cyst on the left  upper forehead There is one blackhead on the right upper forehead There is a 2 x 1-1/2 cyst versus infiltrate on the scalp right above the right ear All lesions are nontender.  No discharge. Derm ref - Dr Alm Keflex  was prescribed in case of another cyst infection       Relevant Orders   Ambulatory referral to Dermatology   Arthralgia   Chronic symptoms.  Musculoskeletal pains.  Osteoarthritis.         Meds ordered this encounter  Medications   cephALEXin  (KEFLEX ) 500 MG capsule    Sig: Take 1 capsule (500 mg total) by mouth 4 (four) times daily.    Dispense:  40 capsule    Refill:  1      Follow-up: Return in about 4 months (around 03/28/2024) for a follow-up visit.  Marolyn Noel, MD

## 2023-11-29 NOTE — Assessment & Plan Note (Addendum)
 On scalp x 2 There is a small cyst on the left upper forehead There is one blackhead on the right upper forehead There is a 2 x 1-1/2 cyst versus infiltrate on the scalp right above the right ear All lesions are nontender.  No discharge. Derm ref - Dr Alm Keflex  was prescribed in case of another cyst infection

## 2023-11-30 NOTE — Assessment & Plan Note (Signed)
 Chronic symptoms.  Musculoskeletal pains.  Osteoarthritis.

## 2023-11-30 NOTE — Assessment & Plan Note (Signed)
 On B12

## 2023-12-01 ENCOUNTER — Ambulatory Visit: Payer: Federal, State, Local not specified - PPO | Admitting: Internal Medicine

## 2023-12-05 ENCOUNTER — Ambulatory Visit: Payer: Federal, State, Local not specified - PPO | Admitting: Orthopedic Surgery

## 2023-12-05 DIAGNOSIS — M79602 Pain in left arm: Secondary | ICD-10-CM | POA: Diagnosis not present

## 2023-12-06 ENCOUNTER — Encounter: Payer: Self-pay | Admitting: Orthopedic Surgery
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
## 2023-12-20 ENCOUNTER — Telehealth: Payer: Self-pay | Admitting: Physical Medicine and Rehabilitation

## 2023-12-20 NOTE — Telephone Encounter (Signed)
Patient returned call asked for a call back. The number to contact patient is [phone]

## 2023-12-22 ENCOUNTER — Other Ambulatory Visit: Payer: Self-pay

## 2023-12-22 ENCOUNTER — Ambulatory Visit: Payer: Federal, State, Local not specified - PPO | Admitting: Physical Medicine and Rehabilitation

## 2023-12-22 DIAGNOSIS — M5412 Radiculopathy, cervical region: Secondary | ICD-10-CM

## 2023-12-22 MED ORDER — METHYLPREDNISOLONE ACETATE 40 MG/ML IJ SUSP
40.0000 mg | Freq: Once | INTRAMUSCULAR | Status: AC
Start: 2023-12-22 — End: 2023-12-22
  Administered 2023-12-22: 40 mg

## 2023-12-22 NOTE — Progress Notes (Signed)
Functional Pain Scale - descriptive words and definitions  Intense (8)    Cannot complete any ADLs without much assistance/cannot concentrate/conversation is difficult/unable to sleep and unable to use distraction. Severe range order  Average Pain 8   Here for neck injection. Pain is around an 8 but varies.  +Driver, -BT, -Dye Allergies.

## 2023-12-22 NOTE — Procedures (Signed)
Cervical Epidural Steroid Injection - Interlaminar Approach with Fluoroscopic Guidance  Patient: Jorge Turner      Date of Birth: 1955-12-20 MRN: [id number] PCP: Tresa Garter, MD      Visit Date: 12/22/2023   Universal Protocol:    Date/Time: 01/23/259:13 AM  Consent Given By: the patient  Position: PRONE  Additional Comments: Vital signs were monitored before and after the procedure. Patient was prepped and draped in the usual sterile fashion. The correct patient, procedure, and site was verified.   Injection Procedure Details:   Procedure diagnoses: Cervical radiculopathy [M54.12]    Meds Administered:  Meds ordered this encounter  Medications   methylPREDNISolone acetate (DEPO-MEDROL) injection 40 mg     Laterality: Left  Location/Site: C7-T1  Needle: 3.5 in., 20 ga. Tuohy  Needle Placement: Paramedian epidural space  Findings:  -Comments: Excellent flow of contrast into the epidural space.  Procedure Details: Using a paramedian approach from the side mentioned above, the region overlying the inferior lamina was localized under fluoroscopic visualization and the soft tissues overlying this structure were infiltrated with 4 ml. of 1% Lidocaine without Epinephrine. A # 20 gauge, Tuohy needle was inserted into the epidural space using a paramedian approach.  The epidural space was localized using loss of resistance along with contralateral oblique bi-planar fluoroscopic views.  After negative aspirate for air, blood, and CSF, a 2 ml. volume of Isovue-250 was injected into the epidural space and the flow of contrast was observed. Radiographs were obtained for documentation purposes.   The injectate was administered into the level noted above.  Additional Comments:  No complications occurred Dressing: 2 x 2 sterile gauze and Band-Aid    Post-procedure details: Patient was observed during the procedure. Post-procedure instructions were reviewed.  Patient  left the clinic in stable condition.

## 2023-12-22 NOTE — Patient Instructions (Signed)

## 2023-12-28 ENCOUNTER — Other Ambulatory Visit: Payer: Self-pay | Admitting: Internal Medicine

## 2023-12-29 NOTE — Progress Notes (Signed)
Jorge Turner - 68 y.o. male MRN [id number]  Date of birth: 1956/03/15  Office Visit Note: Visit Date: 12/22/2023 PCP: Tresa Garter, MD Referred by: Tresa Garter, MD  Subjective: Chief Complaint  Patient presents with   Neck - Pain   HPI:  Jorge SCHWERTNER is a 68 y.o. male who comes in today at the request of Dr. Burnard Bunting for planned Left C7-T1 Cervical Interlaminar epidural steroid injection with fluoroscopic guidance.  The patient has failed conservative care including home exercise, medications, time and activity modification.  This injection will be diagnostic and hopefully therapeutic.  Please see requesting physician notes for further details and justification.   ROS Otherwise per HPI.  Assessment & Plan: Visit Diagnoses:    ICD-10-CM   1. Cervical radiculopathy  M54.12 XR C-ARM NO REPORT    Epidural Steroid injection    methylPREDNISolone acetate (DEPO-MEDROL) injection 40 mg      Plan: No additional findings.   Meds & Orders:  Meds ordered this encounter  Medications   methylPREDNISolone acetate (DEPO-MEDROL) injection 40 mg    Orders Placed This Encounter  Procedures   XR C-ARM NO REPORT   Epidural Steroid injection    Follow-up: Return for visit to requesting provider as needed.   Procedures: No procedures performed  Cervical Epidural Steroid Injection - Interlaminar Approach with Fluoroscopic Guidance  Patient: Jorge Turner      Date of Birth: 09/20/1956 MRN: [id number] PCP: Tresa Garter, MD      Visit Date: 12/22/2023   Universal Protocol:    Date/Time: 01/23/259:13 AM  Consent Given By: the patient  Position: PRONE  Additional Comments: Vital signs were monitored before and after the procedure. Patient was prepped and draped in the usual sterile fashion. The correct patient, procedure, and site was verified.   Injection Procedure Details:   Procedure diagnoses: Cervical radiculopathy [M54.12]    Meds Administered:   Meds ordered this encounter  Medications   methylPREDNISolone acetate (DEPO-MEDROL) injection 40 mg     Laterality: Left  Location/Site: C7-T1  Needle: 3.5 in., 20 ga. Tuohy  Needle Placement: Paramedian epidural space  Findings:  -Comments: Excellent flow of contrast into the epidural space.  Procedure Details: Using a paramedian approach from the side mentioned above, the region overlying the inferior lamina was localized under fluoroscopic visualization and the soft tissues overlying this structure were infiltrated with 4 ml. of 1% Lidocaine without Epinephrine. A # 20 gauge, Tuohy needle was inserted into the epidural space using a paramedian approach.  The epidural space was localized using loss of resistance along with contralateral oblique bi-planar fluoroscopic views.  After negative aspirate for air, blood, and CSF, a 2 ml. volume of Isovue-250 was injected into the epidural space and the flow of contrast was observed. Radiographs were obtained for documentation purposes.   The injectate was administered into the level noted above.  Additional Comments:  No complications occurred Dressing: 2 x 2 sterile gauze and Band-Aid    Post-procedure details: Patient was observed during the procedure. Post-procedure instructions were reviewed.  Patient left the clinic in stable condition.    Clinical History: CLINICAL DATA:  Neck pain with left arm pain extending to the elbow for 6-8 months.   EXAM: MRI CERVICAL SPINE WITHOUT CONTRAST   TECHNIQUE: Multiplanar, multisequence MR imaging of the cervical spine was performed. No intravenous contrast was administered.   COMPARISON:  07/09/2019   FINDINGS: Alignment: Physiologic.   Vertebrae: No acute  fracture, evidence of discitis, or aggressive bone lesion.   Cord: Normal signal and morphology.   Posterior Fossa, vertebral arteries, paraspinal tissues: Posterior fossa demonstrates no focal abnormality. Vertebral  artery flow voids are maintained. Paraspinal soft tissues are unremarkable.   Disc levels:   Discs: Severe disc height loss at C4-5 and C5-6.   C2-3: No significant disc bulge. No neural foraminal stenosis. No central canal stenosis.   C3-4: Mild disc bulge. Moderate right and mild-moderate left foraminal stenosis. No central canal stenosis.   C4-5: Mild disc bulge. Bilateral uncovertebral degenerative changes. Severe bilateral foraminal stenosis. Moderate central canal stenosis.   C5-6: Mild disc osteophyte complex. Bilateral uncovertebral degenerative changes. Severe bilateral foraminal stenosis. Moderate central canal stenosis.   C6-7: Mild disc osteophyte complex. Bilateral uncovertebral degenerative changes. Severe bilateral foraminal stenosis. Mild central canal stenosis.   C7-T1: Mild disc bulge. Moderate right foraminal stenosis and mild left foraminal stenosis. No central canal stenosis.   IMPRESSION: 1. At C4-5 there is a mild disc bulge. Bilateral uncovertebral degenerative changes. Severe bilateral foraminal stenosis. Moderate central canal stenosis. 2. At C5-6 there is a mild disc osteophyte complex. Bilateral uncovertebral degenerative changes. Severe bilateral foraminal stenosis. Moderate central canal stenosis. 3. At C6-7 there is a mild disc osteophyte complex. Bilateral uncovertebral degenerative changes. Severe bilateral foraminal stenosis. Mild central canal stenosis. 4. At C7-T1 there is a mild disc bulge. Moderate right foraminal stenosis and mild left foraminal stenosis. 5. No acute osseous injury of the cervical spine.     Electronically Signed   By: Elige Ko M.D.   On: 11/18/2023 13:11     Objective:  VS:  HT:    WT:   BMI:     BP:   HR: bpm  TEMP: ( )  RESP:  Physical Exam Vitals and nursing note reviewed.  Constitutional:      General: He is not in acute distress.    Appearance: Normal appearance. He is not ill-appearing.   HENT:     Head: Normocephalic and atraumatic.     Right Ear: External ear normal.     Left Ear: External ear normal.  Eyes:     Extraocular Movements: Extraocular movements intact.  Cardiovascular:     Rate and Rhythm: Normal rate.     Pulses: Normal pulses.  Abdominal:     General: There is no distension.     Palpations: Abdomen is soft.  Musculoskeletal:        General: No signs of injury.     Cervical back: Neck supple. Tenderness present. No rigidity.     Right lower leg: No edema.     Left lower leg: No edema.     Comments: Patient has good strength in the upper extremities with 5 out of 5 strength in wrist extension long finger flexion APB.  No intrinsic hand muscle atrophy.  Negative Hoffmann's test.  Lymphadenopathy:     Cervical: No cervical adenopathy.  Skin:    Findings: No erythema or rash.  Neurological:     General: No focal deficit present.     Mental Status: He is alert and oriented to person, place, and time.     Sensory: No sensory deficit.     Motor: No weakness or abnormal muscle tone.     Coordination: Coordination normal.  Psychiatric:        Mood and Affect: Mood normal.        Behavior: Behavior normal.      Imaging: No  results found.

## 2024-01-26 ENCOUNTER — Ambulatory Visit: Payer: Federal, State, Local not specified - PPO | Admitting: Podiatry

## 2024-01-26 ENCOUNTER — Encounter: Payer: Self-pay | Admitting: Podiatry

## 2024-01-26 DIAGNOSIS — L600 Ingrowing nail: Secondary | ICD-10-CM | POA: Diagnosis not present

## 2024-01-26 NOTE — Patient Instructions (Signed)

## 2024-01-26 NOTE — Progress Notes (Signed)
  Subjective:  Patient ID: Jorge Turner, male    DOB: Mar 03, 1956,   MRN: [id number]  No chief complaint on file.   68 y.o. male presents for concern of bilateral ingrown toenails that have been present for months. Relates 20 years ago he did have a procedure to remove the ingrown toenails and did fine but now believes they are back. He denies any current treatments.  . Denies any other pedal complaints. Denies n/v/f/c.   Past Medical History:  Diagnosis Date   Balanitis    BPH (benign prostatic hypertrophy)    DJD (degenerative joint disease) of knee    Eczema    GERD (gastroesophageal reflux disease)    Hemorrhoid    Low back pain    Medial meniscus tear    left   Patellofemoral arthralgia of left knee    Personal history of colonic polyps 03/03/2010   Vitamin B12 deficiency     Objective:  Physical Exam: Vascular: DP/PT pulses 2/4 bilateral. CFT <3 seconds. Normal hair growth on digits. No edema.  Skin. No lacerations or abrasions bilateral feet. Incurvation of bilateral borders or bilateral great toes. Relates tender mostly to the left great toe. No erythema edema or purulence noted.  Musculoskeletal: MMT 5/5 bilateral lower extremities in DF, PF, Inversion and Eversion. Deceased ROM in DF of ankle joint.  Neurological: Sensation intact to light touch.   Assessment:   1. Ingrown left greater toenail   2. Ingrown right greater toenail      Plan:  Patient was evaluated and treated and all questions answered. Discussed ingrown toenails etiology and treatment options including procedure for removal vs conservative care.  Patient requesting removal of ingrown nail today. Procedure below.  Discussed procedure and post procedure care and patient expressed understanding.  Will follow-up in 2 weeks for nail check or sooner if any problems arise.   **Would like to plan for the right great toe at follow up visit   Procedure:  Procedure: partial Nail Avulsion of left hallux  bilateral nail border.  Surgeon: Louann Sjogren, DPM  Pre-op Dx: Ingrown toenail without infection Post-op: Same  Place of Surgery: Office exam room.  Indications for surgery: Painful and ingrown toenail.    The patient is requesting removal of nail with  chemical matrixectomy. Risks and complications were discussed with the patient for which they understand and written consent was obtained. Under sterile conditions a total of 3 mL of  1% lidocaine plain was infiltrated in a hallux block fashion. Once anesthetized, the skin was prepped in sterile fashion. A tourniquet was then applied. Next the bilateral aspect of hallux nail border was then sharply excised making sure to remove the entire offending nail border.  Next phenol was then applied under standard conditions to permanently destroy the matrix and copiously irrigated. Silvadene was applied. A dry sterile dressing was applied. After application of the dressing the tourniquet was removed and there is found to be an immediate capillary refill time to the digit. The patient tolerated the procedure well without any complications. Post procedure instructions were discussed the patient for which he verbally understood. Follow-up in two weeks for nail check or sooner if any problems are to arise. Discussed signs/symptoms of infection and directed to call the office immediately should any occur or go directly to the emergency room. In the meantime, encouraged to call the office with any questions, concerns, changes symptoms.   Louann Sjogren, DPM

## 2024-02-09 ENCOUNTER — Encounter: Payer: Self-pay | Admitting: Podiatry

## 2024-02-09 ENCOUNTER — Ambulatory Visit: Payer: Federal, State, Local not specified - PPO | Admitting: Podiatry

## 2024-02-09 DIAGNOSIS — L600 Ingrowing nail: Secondary | ICD-10-CM

## 2024-02-09 NOTE — Progress Notes (Signed)
  Subjective:  Patient ID: [name], male    DOB: 08-Feb-1956,   MRN: [id number]  No chief complaint on file.   68 y.o. male presents for follow-up of left ingrown nail procedure. Relates doing well some soreness and still bleeding. Has been soaking as instructed.  Does want to hold off on right great toe for now he is still is bowling season. . Denies any other pedal complaints. Denies n/v/f/c.   Past Medical History:  Diagnosis Date   Balanitis    BPH (benign prostatic hypertrophy)    DJD (degenerative joint disease) of knee    Eczema    GERD (gastroesophageal reflux disease)    Hemorrhoid    Low back pain    Medial meniscus tear    left   Patellofemoral arthralgia of left knee    Personal history of colonic polyps 03/03/2010   Vitamin B12 deficiency     Objective:  Physical Exam: Vascular: DP/PT pulses 2/4 bilateral. CFT <3 seconds. Normal hair growth on digits. No edema.  Skin. No lacerations or abrasions bilateral feet. Left hallux nail healing well.  Musculoskeletal: MMT 5/5 bilateral lower extremities in DF, PF, Inversion and Eversion. Deceased ROM in DF of ankle joint.  Neurological: Sensation intact to light touch.   Assessment:   1. Ingrown left greater toenail      Plan:  Patient was evaluated and treated and all questions answered. Toe was evaluated and appears to be healing well.  May discontinue soaks and neosporin.  Patient to follow-up as needed.    Louann Sjogren, DPM

## 2024-02-14 ENCOUNTER — Encounter: Payer: Self-pay | Admitting: Family Medicine

## 2024-02-14 ENCOUNTER — Ambulatory Visit: Admitting: Family Medicine

## 2024-02-14 VITALS — BP 116/80 | HR 66 | Temp 98.3°F | Ht 70.0 in | Wt 159.8 lb

## 2024-02-14 DIAGNOSIS — R300 Dysuria: Secondary | ICD-10-CM | POA: Diagnosis not present

## 2024-02-14 DIAGNOSIS — R0981 Nasal congestion: Secondary | ICD-10-CM | POA: Diagnosis not present

## 2024-02-14 DIAGNOSIS — J0141 Acute recurrent pansinusitis: Secondary | ICD-10-CM

## 2024-02-14 MED ORDER — AMOXICILLIN-POT CLAVULANATE 875-125 MG PO TABS
1.0000 | ORAL_TABLET | Freq: Two times a day (BID) | ORAL | 0 refills | Status: AC
Start: 2024-02-14 — End: 2024-02-21

## 2024-02-14 NOTE — Patient Instructions (Signed)
 I have sent in Augmentin for you to take twice a day for 7 days.  This medication can upset your stomach, so I tell everyone to take it with a meal.  Follow-up with urology if urinary symptoms persist.  Follow-up with PCP as scheduled, sooner if needed

## 2024-02-14 NOTE — Progress Notes (Signed)
 Acute Office Visit  Subjective:     Patient ID: Jorge Turner, male    DOB: 1956-08-03, 68 y.o.   MRN: [id number]  Chief Complaint  Patient presents with   Acute Visit    Ongoing UTI symptoms, not emptying bladder, painful urination.    HPI Patient is in today for evaluation of continued sinus pain, headaches, for the last 2 weeks. Has been seen at urgent care and was treated with Augmentin. Reports that often it takes 2 rounds of antibiotics to resolve infections. Denies known sick contacts. Denies abdominal pain, nausea, vomiting, diarrhea, rash, fever, chills. Reports that he is still having dysuria as well as urinary hesitancy and frequency, urgency. Denies hematuria, abdominal pain, other symptoms. Medical hx as outlined below.  ROS Per HPI      Objective:    BP 116/80 (BP Location: Left Arm, Patient Position: Sitting)   Pulse 66   Temp 98.3 F (36.8 C) (Temporal)   Ht 5\' 10"  (1.778 m)   Wt 159 lb 12.8 oz (72.5 kg)   SpO2 99%   BMI 22.93 kg/m    Physical Exam Vitals and nursing note reviewed.  Constitutional:      General: He is not in acute distress.    Appearance: Normal appearance.  HENT:     Head: Normocephalic and atraumatic.     Right Ear: Tympanic membrane and ear canal normal.     Left Ear: Tympanic membrane and ear canal normal.     Nose: Congestion present.     Right Sinus: Frontal sinus tenderness present.     Left Sinus: Frontal sinus tenderness present.     Mouth/Throat:     Mouth: Mucous membranes are moist.     Comments: Oropharyngeal cobblestoning   Eyes:     Extraocular Movements: Extraocular movements intact.  Cardiovascular:     Rate and Rhythm: Normal rate and regular rhythm.     Pulses: Normal pulses.     Heart sounds: Normal heart sounds.  Pulmonary:     Effort: Pulmonary effort is normal. No respiratory distress.     Breath sounds: Normal breath sounds. No wheezing, rhonchi or rales.  Musculoskeletal:        General:  Normal range of motion.     Cervical back: Normal range of motion.  Lymphadenopathy:     Cervical: Cervical adenopathy present.  Skin:    General: Skin is warm and dry.  Neurological:     General: No focal deficit present.     Mental Status: He is alert and oriented to person, place, and time.  Psychiatric:        Mood and Affect: Mood normal.        Behavior: Behavior normal.    No results found for any visits on 02/14/24.      Assessment & Plan:   Recurrent pansinusitis -     Amoxicillin-Pot Clavulanate; Take 1 tablet by mouth 2 (two) times daily for 7 days.  Dispense: 14 tablet; Refill: 0  Nasal congestion  Dysuria -     POCT Urinalysis Dipstick (Automated)   May continue OTC decongestant Unable to provide urine specimen Discussed to follow up with urology if symptoms persist  Meds ordered this encounter  Medications   amoxicillin-clavulanate (AUGMENTIN) 875-125 MG tablet    Sig: Take 1 tablet by mouth 2 (two) times daily for 7 days.    Dispense:  14 tablet    Refill:  0    Return if symptoms  worsen or fail to improve.  Moshe Cipro, FNP

## 2024-04-02 ENCOUNTER — Ambulatory Visit: Payer: Federal, State, Local not specified - PPO | Admitting: Internal Medicine

## 2024-04-02 ENCOUNTER — Encounter: Payer: Self-pay | Admitting: Internal Medicine

## 2024-04-02 VITALS — BP 92/68 | HR 63 | Temp 98.2°F | Ht 70.0 in | Wt 162.2 lb

## 2024-04-02 DIAGNOSIS — E538 Deficiency of other specified B group vitamins: Secondary | ICD-10-CM | POA: Diagnosis not present

## 2024-04-02 DIAGNOSIS — E559 Vitamin D deficiency, unspecified: Secondary | ICD-10-CM

## 2024-04-02 DIAGNOSIS — G8929 Other chronic pain: Secondary | ICD-10-CM

## 2024-04-02 DIAGNOSIS — K219 Gastro-esophageal reflux disease without esophagitis: Secondary | ICD-10-CM | POA: Diagnosis not present

## 2024-04-02 DIAGNOSIS — E785 Hyperlipidemia, unspecified: Secondary | ICD-10-CM

## 2024-04-02 DIAGNOSIS — M25512 Pain in left shoulder: Secondary | ICD-10-CM

## 2024-04-02 LAB — LIPID PANEL
Cholesterol: 222 mg/dL — ABNORMAL HIGH (ref 0–200)
HDL: 65 mg/dL (ref >39.00)
LDL Cholesterol: 139 mg/dL — ABNORMAL HIGH (ref 0–99)
NonHDL: 157.02
Total CHOL/HDL Ratio: 3
Triglycerides: 88 mg/dL (ref 0.0–149.0)
VLDL: 17.6 mg/dL (ref 0.0–40.0)

## 2024-04-02 LAB — URINALYSIS
Bilirubin Urine: NEGATIVE
Ketones, ur: NEGATIVE
Leukocytes,Ua: NEGATIVE
Nitrite: NEGATIVE
Specific Gravity, Urine: 1.015 (ref 1.000–1.030)
Total Protein, Urine: NEGATIVE
Urine Glucose: NEGATIVE
Urobilinogen, UA: 0.2 (ref 0.0–1.0)
pH: 6 (ref 5.0–8.0)

## 2024-04-02 LAB — COMPREHENSIVE METABOLIC PANEL WITH GFR
ALT: 12 U/L (ref 0–53)
AST: 13 U/L (ref 0–37)
Albumin: 4.3 g/dL (ref 3.5–5.2)
Alkaline Phosphatase: 65 U/L (ref 39–117)
BUN: 27 mg/dL — ABNORMAL HIGH (ref 6–23)
CO2: 26 meq/L (ref 19–32)
Calcium: 9.5 mg/dL (ref 8.4–10.5)
Chloride: 106 meq/L (ref 96–112)
Creatinine, Ser: 1.78 mg/dL — ABNORMAL HIGH (ref 0.40–1.50)
GFR: 39.08 mL/min — ABNORMAL LOW (ref >60.00)
Glucose, Bld: 87 mg/dL (ref 70–99)
Potassium: 4.2 meq/L (ref 3.5–5.1)
Sodium: 140 meq/L (ref 135–145)
Total Bilirubin: 0.6 mg/dL (ref 0.2–1.2)
Total Protein: 7 g/dL (ref 6.0–8.3)

## 2024-04-02 LAB — TSH: TSH: 1.99 u[IU]/mL (ref 0.35–5.50)

## 2024-04-02 MED ORDER — CEFDINIR 300 MG PO CAPS: 300.0000 mg | ORAL_CAPSULE | Freq: Two times a day (BID) | ORAL | 0 refills | Status: DC

## 2024-04-02 MED ORDER — METHYLPREDNISOLONE 4 MG PO TBPK: ORAL_TABLET | ORAL | 0 refills | Status: DC

## 2024-04-02 MED ORDER — TRAMADOL HCL 50 MG PO TABS: 50.0000 mg | ORAL_TABLET | Freq: Four times a day (QID) | ORAL | 1 refills | Status: AC | PRN

## 2024-04-02 NOTE — Patient Instructions (Signed)
 Sauna Blanket, Sauna, Portable Sauna for Home, 86-158?, 20-60 Minutes Timer, 6 ft x 2.62 ft

## 2024-04-02 NOTE — Assessment & Plan Note (Signed)
Worse Prilosec - increase from 40 mg qd to 20 mg bid

## 2024-04-02 NOTE — Assessment & Plan Note (Signed)
 Check lipids

## 2024-04-02 NOTE — Assessment & Plan Note (Signed)
 Pt had an MRI Seeing Dr Daisey Dryer Tramadol  prn  Potential benefits of a short or long term opioids use as well as potential risks (i.e. addiction risk, apnea etc) and complications (i.e. Somnolence, constipation and others) were explained to the patient and were aknowledged.  Sauna Blanket, Sauna, Portable Sauna for Home, 86-158?, 20-60 Minutes Timer, 6 ft x 2.62 ft Blue-Emu cream was recommended to use 2-3 times a day  Medrol 

## 2024-04-02 NOTE — Assessment & Plan Note (Signed)
 On Vit D

## 2024-04-02 NOTE — Assessment & Plan Note (Signed)
 On B12

## 2024-04-02 NOTE — Progress Notes (Signed)
 Subjective:  Patient ID: Jorge Turner, male    DOB: October 20, 1956  Age: 68 y.o. MRN: [id number]  CC: Medical Management of Chronic Issues (6 Month follow up. Last seen by FNP Augustus Ledger for pansinusitis. Has cleared by patient still experiencing some change in taste. Gaspar Karma notes some pain/discomofort in left shoulder. Patient has since had an epidural and Cortizone shot.  Plan of treatment is differing between the specialists patient seeing)   HPI Jorge Turner presents for sinusitis since March  Outpatient Medications Prior to Visit  Medication Sig Dispense Refill   acetaminophen  (TYLENOL ) 500 MG tablet Take 500 mg by mouth every 6 (six) hours as needed.     cholecalciferol (VITAMIN D ) 1000 UNITS tablet Take 1,000 Units by mouth daily.     clotrimazole -betamethasone  (LOTRISONE ) cream Apply 1 Application topically 2 (two) times daily. Itching, rash 90 g 1   Cyanocobalamin  (VITAMIN B-12) 1000 MCG SUBL Place 1 tablet (1,000 mcg total) under the tongue daily. 100 tablet 3   famotidine  (PEPCID ) 40 MG tablet Take 1 tablet (40 mg total) by mouth daily. 90 tablet 3   finasteride  (PROSCAR ) 5 MG tablet TAKE 1 TABLET BY MOUTH EVERY DAY 90 tablet 3   hydrOXYzine  (ATARAX ) 25 MG tablet TAKE 1 TABLET BY MOUTH EVERY 8 HOURS AS NEEDED FOR ITCHING. 60 tablet 1   meclizine  (ANTIVERT ) 12.5 MG tablet Take 1-2 tablets (12.5-25 mg total) by mouth 3 (three) times daily as needed for dizziness. 40 tablet 0   rosuvastatin (CRESTOR) 10 MG tablet Take 5 mg by mouth daily. Take one-half tablet by mouth once a day for cholesterol     tadalafil  (CIALIS ) 5 MG tablet Take 1 tablet (5 mg total) by mouth daily. 90 tablet 3   traMADol  (ULTRAM ) 50 MG tablet Take 1 tablet (50 mg total) by mouth every 6 (six) hours as needed. 20 tablet 0   No facility-administered medications prior to visit.    ROS: Review of Systems  Constitutional:  Negative for appetite change, fatigue and unexpected weight change.  HENT:  Negative for  congestion, nosebleeds, sneezing, sore throat and trouble swallowing.   Eyes:  Negative for itching and visual disturbance.  Respiratory:  Negative for cough.   Cardiovascular:  Negative for chest pain, palpitations and leg swelling.  Gastrointestinal:  Negative for abdominal distention, blood in stool, diarrhea and nausea.  Genitourinary:  Negative for frequency and hematuria.  Musculoskeletal:  Negative for back pain, gait problem, joint swelling and neck pain.  Skin:  Negative for rash.  Neurological:  Negative for dizziness, tremors, speech difficulty and weakness.  Psychiatric/Behavioral:  Negative for agitation, dysphoric mood, sleep disturbance and suicidal ideas. The patient is not nervous/anxious.     Objective:  BP 92/68   Pulse 63   Temp 98.2 F (36.8 C)   Ht 5\' 10"  (1.778 m)   Wt 162 lb 3.2 oz (73.6 kg)   SpO2 97%   BMI 23.27 kg/m   BP Readings from Last 3 Encounters:  04/02/24 92/68  02/14/24 116/80  11/29/23 112/74    Wt Readings from Last 3 Encounters:  04/02/24 162 lb 3.2 oz (73.6 kg)  02/14/24 159 lb 12.8 oz (72.5 kg)  11/29/23 160 lb 9.6 oz (72.8 kg)    Physical Exam Constitutional:      General: He is not in acute distress.    Appearance: He is well-developed.     Comments: NAD  Eyes:     Conjunctiva/sclera: Conjunctivae normal.  Pupils: Pupils are equal, round, and reactive to light.  Neck:     Thyroid : No thyromegaly.     Vascular: No JVD.  Cardiovascular:     Rate and Rhythm: Normal rate and regular rhythm.     Heart sounds: Normal heart sounds. No murmur heard.    No friction rub. No gallop.  Pulmonary:     Effort: Pulmonary effort is normal. No respiratory distress.     Breath sounds: Normal breath sounds. No wheezing or rales.  Chest:     Chest wall: No tenderness.  Abdominal:     General: Bowel sounds are normal. There is no distension.     Palpations: Abdomen is soft. There is no mass.     Tenderness: There is no abdominal  tenderness. There is no guarding or rebound.  Musculoskeletal:        General: Tenderness present. Normal range of motion.     Cervical back: Normal range of motion.  Lymphadenopathy:     Cervical: No cervical adenopathy.  Skin:    General: Skin is warm and dry.     Findings: No rash.  Neurological:     Mental Status: He is alert and oriented to person, place, and time.     Cranial Nerves: No cranial nerve deficit.     Motor: No abnormal muscle tone.     Coordination: Coordination normal.     Gait: Gait normal.     Deep Tendon Reflexes: Reflexes are normal and symmetric.  Psychiatric:        Behavior: Behavior normal.        Thought Content: Thought content normal.        Judgment: Judgment normal.   Painful shoulders L>>R  Lab Results  Component Value Date   WBC 3.9 (L) 11/26/2019   HGB 15.4 11/26/2019   HCT 46.3 11/26/2019   PLT 265.0 11/26/2019   GLUCOSE 96 08/16/2023   CHOL 255 (H) 08/16/2023   TRIG 104.0 08/16/2023   HDL 71.10 08/16/2023   LDLDIRECT 140.3 10/26/2011   LDLCALC 163 (H) 08/16/2023   ALT 11 08/16/2023   AST 12 08/16/2023   NA 139 08/16/2023   K 4.4 08/16/2023   CL 103 08/16/2023   CREATININE 1.67 (H) 08/16/2023   BUN 33 (H) 08/16/2023   CO2 26 08/16/2023   TSH 3.94 02/08/2018   PSA 13.16 (H) 08/16/2023    MR Cervical Spine w/o contrast Result Date: 11/18/2023 CLINICAL DATA:  Neck pain with left arm pain extending to the elbow for 6-8 months. EXAM: MRI CERVICAL SPINE WITHOUT CONTRAST TECHNIQUE: Multiplanar, multisequence MR imaging of the cervical spine was performed. No intravenous contrast was administered. COMPARISON:  07/09/2019 FINDINGS: Alignment: Physiologic. Vertebrae: No acute fracture, evidence of discitis, or aggressive bone lesion. Cord: Normal signal and morphology. Posterior Fossa, vertebral arteries, paraspinal tissues: Posterior fossa demonstrates no focal abnormality. Vertebral artery flow voids are maintained. Paraspinal soft tissues  are unremarkable. Disc levels: Discs: Severe disc height loss at C4-5 and C5-6. C2-3: No significant disc bulge. No neural foraminal stenosis. No central canal stenosis. C3-4: Mild disc bulge. Moderate right and mild-moderate left foraminal stenosis. No central canal stenosis. C4-5: Mild disc bulge. Bilateral uncovertebral degenerative changes. Severe bilateral foraminal stenosis. Moderate central canal stenosis. C5-6: Mild disc osteophyte complex. Bilateral uncovertebral degenerative changes. Severe bilateral foraminal stenosis. Moderate central canal stenosis. C6-7: Mild disc osteophyte complex. Bilateral uncovertebral degenerative changes. Severe bilateral foraminal stenosis. Mild central canal stenosis. C7-T1: Mild disc bulge. Moderate right foraminal  stenosis and mild left foraminal stenosis. No central canal stenosis. IMPRESSION: 1. At C4-5 there is a mild disc bulge. Bilateral uncovertebral degenerative changes. Severe bilateral foraminal stenosis. Moderate central canal stenosis. 2. At C5-6 there is a mild disc osteophyte complex. Bilateral uncovertebral degenerative changes. Severe bilateral foraminal stenosis. Moderate central canal stenosis. 3. At C6-7 there is a mild disc osteophyte complex. Bilateral uncovertebral degenerative changes. Severe bilateral foraminal stenosis. Mild central canal stenosis. 4. At C7-T1 there is a mild disc bulge. Moderate right foraminal stenosis and mild left foraminal stenosis. 5. No acute osseous injury of the cervical spine. Electronically Signed   By: Onnie Bilis M.D.   On: 11/18/2023 13:11    Assessment & Plan:   Problem List Items Addressed This Visit     B12 deficiency - Primary   On B12      GERD   Worse Prilosec - increase from 40 mg qd to 20 mg bid      Hyperlipidemia   Check lipids      Relevant Medications   rosuvastatin (CRESTOR) 10 MG tablet   Vitamin D  deficiency   On Vit D      Shoulder pain, left   Pt had an MRI Seeing Dr  Daisey Dryer Tramadol  prn  Potential benefits of a short or long term opioids use as well as potential risks (i.e. addiction risk, apnea etc) and complications (i.e. Somnolence, constipation and others) were explained to the patient and were aknowledged.  Sauna Blanket, Sauna, Portable Sauna for Home, 86-158?, 20-60 Minutes Timer, 6 ft x 2.62 ft Blue-Emu cream was recommended to use 2-3 times a day  Medrol          Meds ordered this encounter  Medications   methylPREDNISolone  (MEDROL  DOSEPAK) 4 MG TBPK tablet    Sig: As directed    Dispense:  21 tablet    Refill:  0   cefdinir  (OMNICEF ) 300 MG capsule    Sig: Take 1 capsule (300 mg total) by mouth 2 (two) times daily.    Dispense:  20 capsule    Refill:  0   traMADol  (ULTRAM ) 50 MG tablet    Sig: Take 1 tablet (50 mg total) by mouth every 6 (six) hours as needed.    Dispense:  20 tablet    Refill:  1      Follow-up: Return in about 4 months (around 08/03/2024) for a follow-up visit.  Jorge Barn, MD

## 2024-04-05 ENCOUNTER — Encounter: Payer: Self-pay | Admitting: Internal Medicine

## 2024-04-09 ENCOUNTER — Encounter (HOSPITAL_COMMUNITY): Payer: Self-pay

## 2024-04-16 ENCOUNTER — Ambulatory Visit

## 2024-04-16 VITALS — Ht 70.0 in | Wt 162.0 lb

## 2024-04-16 DIAGNOSIS — Z860100 Personal history of colon polyps, unspecified: Secondary | ICD-10-CM

## 2024-04-16 MED ORDER — NA SULFATE-K SULFATE-MG SULF 17.5-3.13-1.6 GM/177ML PO SOLN
1.0000 | Freq: Once | ORAL | 0 refills | Status: AC
Start: 2024-04-16 — End: 2024-04-16

## 2024-04-16 NOTE — Progress Notes (Signed)

## 2024-04-17 ENCOUNTER — Encounter: Payer: Self-pay | Admitting: Internal Medicine

## 2024-04-30 ENCOUNTER — Encounter: Payer: Self-pay | Admitting: Internal Medicine

## 2024-04-30 ENCOUNTER — Ambulatory Visit (AMBULATORY_SURGERY_CENTER): Admitting: Internal Medicine

## 2024-04-30 VITALS — BP 117/75 | HR 49 | Temp 97.3°F | Resp 12 | Ht 70.0 in | Wt 162.0 lb

## 2024-04-30 DIAGNOSIS — K648 Other hemorrhoids: Secondary | ICD-10-CM

## 2024-04-30 DIAGNOSIS — Z860100 Personal history of colon polyps, unspecified: Secondary | ICD-10-CM

## 2024-04-30 DIAGNOSIS — K5730 Diverticulosis of large intestine without perforation or abscess without bleeding: Secondary | ICD-10-CM | POA: Diagnosis not present

## 2024-04-30 DIAGNOSIS — Z1211 Encounter for screening for malignant neoplasm of colon: Secondary | ICD-10-CM | POA: Diagnosis present

## 2024-04-30 MED ORDER — SODIUM CHLORIDE 0.9 % IV SOLN: 500.0000 mL | Freq: Once | INTRAVENOUS | Status: DC

## 2024-04-30 NOTE — Op Note (Signed)
 [facility] Patient Name: Jorge Turner Procedure Date: 04/30/2024 10:19 AM MRN: [id number] Endoscopist: Murel Arlington. Elvin Hammer , MD, [id number] Age: 68 Referring MD:  Date of Birth: [birth year] Gender: Male Account #: [id number] Procedure:                Colonoscopy Indications:              High risk colon cancer surveillance: Personal                            history of non-advanced adenoma. Previous                            examinations 1999, 2007, 2011, 2018 Medicines:                Monitored Anesthesia Care Procedure:                Pre-Anesthesia Assessment:                           - Prior to the procedure, a History and Physical                            was performed, and patient medications and                            allergies were reviewed. The patient's tolerance of                            previous anesthesia was also reviewed. The risks                            and benefits of the procedure and the sedation                            options and risks were discussed with the patient.                            All questions were answered, and informed consent                            was obtained. Prior Anticoagulants: The patient has                            taken no anticoagulant or antiplatelet agents. ASA                            Grade Assessment: II - A patient with mild systemic                            disease. After reviewing the risks and benefits,                            the patient was deemed in satisfactory condition to  undergo the procedure.                           After obtaining informed consent, the colonoscope                            was passed under direct vision. Throughout the                            procedure, the patient's blood pressure, pulse, and                            oxygen saturations were monitored continuously. The                            CF [id number] #[id number] was introduced  through the anus                            and advanced to the the cecum, identified by                            appendiceal orifice and ileocecal valve. The                            ileocecal valve, appendiceal orifice, and rectum                            were photographed. The quality of the bowel                            preparation was excellent. The colonoscopy was                            performed without difficulty. The patient tolerated                            the procedure well. The bowel preparation used was                            SUPREP via split dose instruction. Scope In: 10:42:37 AM Scope Out: 10:54:57 AM Scope Withdrawal Time: 0 hours 10 minutes 38 seconds  Total Procedure Duration: 0 hours 12 minutes 20 seconds  Findings:                 Many diverticula were found in the entire colon.                           Internal hemorrhoids were found during                            retroflexion. The hemorrhoids were small.                           The exam was otherwise without abnormality on  direct and retroflexion views. Complications:            No immediate complications. Estimated blood loss:                            None. Estimated Blood Loss:     Estimated blood loss: none. Impression:               - Diverticulosis in the entire examined colon.                           - Internal hemorrhoids.                           - The examination was otherwise normal on direct                            and retroflexion views.                           - No specimens collected. Recommendation:           - Repeat colonoscopy in 10 years for surveillance.                           - Patient has a contact number available for                            emergencies. The signs and symptoms of potential                            delayed complications were discussed with the                            patient. Return to normal  activities tomorrow.                            Written discharge instructions were provided to the                            patient.                           - Resume previous diet.                           - Continue present medications. Murel Arlington. Elvin Hammer, MD 04/30/2024 11:01:05 AM This report has been signed electronically.

## 2024-04-30 NOTE — Progress Notes (Signed)
 Pt's states no medical or surgical changes since previsit or office visit.

## 2024-04-30 NOTE — Patient Instructions (Signed)
  Resume previous diet Continue present medications Handouts on diverticulosis and hemorrhoids given to you today   YOU HAD AN ENDOSCOPIC PROCEDURE TODAY AT THE [facility]:   Refer to the procedure report that was given to you for any specific questions about what was found during the examination.  If the procedure report does not answer your questions, please call your gastroenterologist to clarify.  If you requested that your care partner not be given the details of your procedure findings, then the procedure report has been included in a sealed envelope for you to review at your convenience later.  YOU SHOULD EXPECT: Some feelings of bloating in the abdomen. Passage of more gas than usual.  Walking can help get rid of the air that was put into your GI tract during the procedure and reduce the bloating. If you had a lower endoscopy (such as a colonoscopy or flexible sigmoidoscopy) you may notice spotting of blood in your stool or on the toilet paper. If you underwent a bowel prep for your procedure, you may not have a normal bowel movement for a few days.  Please Note:  You might notice some irritation and congestion in your nose or some drainage.  This is from the oxygen used during your procedure.  There is no need for concern and it should clear up in a day or so.  SYMPTOMS TO REPORT IMMEDIATELY:  Following lower endoscopy (colonoscopy or flexible sigmoidoscopy):  Excessive amounts of blood in the stool  Significant tenderness or worsening of abdominal pains  Swelling of the abdomen that is new, acute  Fever of 100F or higher  For urgent or emergent issues, a gastroenterologist can be reached at any hour by calling [phone]. Do not use MyChart messaging for urgent concerns.    DIET:  We do recommend a small meal at first, but then you may proceed to your regular diet.  Drink plenty of fluids but you should avoid alcoholic beverages for 24 hours.  ACTIVITY:  You  should plan to take it easy for the rest of today and you should NOT DRIVE or use heavy machinery until tomorrow (because of the sedation medicines used during the test).    FOLLOW UP: Our staff will call the number listed on your records the next business day following your procedure.  We will call around 7:15- 8:00 am to check on you and address any questions or concerns that you may have regarding the information given to you following your procedure. If we do not reach you, we will leave a message.     If any biopsies were taken you will be contacted by phone or by letter within the next 1-3 weeks.  Please call us  at [phone] if you have not heard about the biopsies in 3 weeks.    SIGNATURES/CONFIDENTIALITY: You and/or your care partner have signed paperwork which will be entered into your electronic medical record.  These signatures attest to the fact that that the information above on your After Visit Summary has been reviewed and is understood.  Full responsibility of the confidentiality of this discharge information lies with you and/or your care-partner.

## 2024-04-30 NOTE — Progress Notes (Signed)
 Report to PACU, RN, vss, BBS= Clear.

## 2024-04-30 NOTE — Progress Notes (Signed)
 HISTORY OF PRESENT ILLNESS:  Jorge Turner is a 68 y.o. male with a history of adenomatous colon polyps.  Presents today for surveillance colonoscopy.  No complaints  REVIEW OF SYSTEMS:  All non-GI ROS negative except for  Past Medical History:  Diagnosis Date   Balanitis    BPH (benign prostatic hypertrophy)    DJD (degenerative joint disease) of knee    Eczema    GERD (gastroesophageal reflux disease)    Hemorrhoid    Low back pain    Medial meniscus tear    left   Patellofemoral arthralgia of left knee    Personal history of colonic polyps 03/03/2010   Vitamin B12 deficiency     Past Surgical History:  Procedure Laterality Date   ANTERIOR INTEROSSEOUS NERVE DECOMPRESSION Right 07/10/2020   Procedure: POSTERIOR INTEROSSEOUS NERVE RESECTION;  Surgeon: Lyanne Sample, MD;  Location: [facility];  Service: Orthopedics;  Laterality: Right;  AXILLARY BLOCK   CARPAL TUNNEL RELEASE Right 04/11/2018   Procedure: RIGHT OPEN CARPAL TUNNEL RELEASE;  Surgeon: Alphonso Jean, MD;  Location: [facility];  Service: Orthopedics;  Laterality: Right;   CARPECTOMY WITH RADIAL STYLOIDECTOMY Right 07/10/2020   Procedure: RIGHT PROXIMAL ROW CARPECTOMY WITH RADIAL STYLOIDECTOMY;  Surgeon: Lyanne Sample, MD;  Location: [facility];  Service: Orthopedics;  Laterality: Right;  AXILLARY BLOCK   CIRCUMCISION N/A 07/20/2013   Procedure: CIRCUMCISION ADULT;  Surgeon: Mark C Ottelin, MD;  Location: [facility];  Service: Urology;  Laterality: N/A;   COLONOSCOPY     CYSTO REMOVAL RIGHT THUMB  2006   EXCISION METACARPAL MASS Right 01/29/2022   Procedure: EXCISION METACARPAL MASS RIGHT RING FINGER AND RIGHT SMALL FINGER;  Surgeon: Lyanne Sample, MD;  Location: [facility];  Service: Orthopedics;  Laterality: Right;  60 MIN   RIGHT HYDROCELECTOMY  11-13-2010    Social History Jorge Turner  reports that he has never smoked. He has never used smokeless  tobacco. He reports that he does not drink alcohol and does not use drugs.  family history includes Diabetes in his father and mother; Heart disease in his father and sister; Hypertension in his father and mother.  Allergies  Allergen Reactions   Erythromycin  Ethylsuccinate Anaphylaxis   Erythromycin  Ethylsuccinate Anaphylaxis   Doxycycline  Nausea And Vomiting       PHYSICAL EXAMINATION: Vital signs: BP 134/83   Pulse (!) 52   Temp (!) 97.3 F (36.3 C)   Resp 13   Ht 5\' 10"  (1.778 m)   Wt 162 lb (73.5 kg)   SpO2 99%   BMI 23.24 kg/m  General: Well-developed, well-nourished, no acute distress HEENT: Sclerae are anicteric, conjunctiva pink. Oral mucosa intact Lungs: Clear Heart: Regular Abdomen: soft, nontender, nondistended, no obvious ascites, no peritoneal signs, normal bowel sounds. No organomegaly. Extremities: No edema Psychiatric: alert and oriented x3. Cooperative     ASSESSMENT:   History of adenomatous polyps  PLAN:   Surveillance colonoscopy

## 2024-05-01 ENCOUNTER — Telehealth: Payer: Self-pay | Admitting: Lactation Services

## 2024-05-01 NOTE — Telephone Encounter (Signed)
 No answer left voice mail

## 2024-05-17 ENCOUNTER — Encounter: Payer: Self-pay | Admitting: Nurse Practitioner

## 2024-05-17 ENCOUNTER — Other Ambulatory Visit: Payer: Self-pay | Admitting: Nurse Practitioner

## 2024-05-17 DIAGNOSIS — R972 Elevated prostate specific antigen [PSA]: Secondary | ICD-10-CM

## 2024-06-16 ENCOUNTER — Ambulatory Visit
Admission: RE | Admit: 2024-06-16 | Discharge: 2024-06-16 | Disposition: A | Discharge status: Home / Self Care | Source: Ambulatory Visit | Attending: Nurse Practitioner

## 2024-06-16 DIAGNOSIS — R972 Elevated prostate specific antigen [PSA]: Secondary | ICD-10-CM

## 2024-06-16 MED ORDER — GADOPICLENOL 0.5 MMOL/ML IV SOLN
7.5000 mL | Freq: Once | INTRAVENOUS | Status: AC | PRN
  Administered 2024-06-16: 7.5 mL via INTRAVENOUS

## 2024-06-20 ENCOUNTER — Ambulatory Visit: Admitting: Emergency Medicine

## 2024-06-20 ENCOUNTER — Encounter: Payer: Self-pay | Admitting: Emergency Medicine

## 2024-06-20 VITALS — BP 130/86 | HR 63 | Temp 98.1°F | Ht 70.0 in | Wt 163.0 lb

## 2024-06-20 DIAGNOSIS — R300 Dysuria: Secondary | ICD-10-CM

## 2024-06-20 DIAGNOSIS — J329 Chronic sinusitis, unspecified: Secondary | ICD-10-CM

## 2024-06-20 DIAGNOSIS — B9689 Other specified bacterial agents as the cause of diseases classified elsewhere: Secondary | ICD-10-CM

## 2024-06-20 DIAGNOSIS — N401 Enlarged prostate with lower urinary tract symptoms: Secondary | ICD-10-CM

## 2024-06-20 DIAGNOSIS — R0981 Nasal congestion: Secondary | ICD-10-CM

## 2024-06-20 LAB — POC URINALSYSI DIPSTICK (AUTOMATED)
Bilirubin, UA: NEGATIVE
Blood, UA: NEGATIVE
Glucose, UA: NEGATIVE
Ketones, UA: NEGATIVE
Leukocytes, UA: NEGATIVE
Nitrite, UA: NEGATIVE
Protein, UA: NEGATIVE
Spec Grav, UA: 1.02 (ref 1.010–1.025)
Urobilinogen, UA: 0.2 U/dL
pH, UA: 6 (ref 5.0–8.0)

## 2024-06-20 MED ORDER — AMOXICILLIN-POT CLAVULANATE 875-125 MG PO TABS: 1.0000 | ORAL_TABLET | Freq: Two times a day (BID) | ORAL | 0 refills | Status: AC

## 2024-06-20 MED ORDER — PSEUDOEPHEDRINE-GUAIFENESIN ER 60-600 MG PO TB12: 1.0000 | ORAL_TABLET | Freq: Two times a day (BID) | ORAL | 1 refills | Status: AC

## 2024-06-20 NOTE — Assessment & Plan Note (Signed)
 Normal urinalysis Urine sent for culture Doubt infection Most likely secondary to prostate enlargement

## 2024-06-20 NOTE — Assessment & Plan Note (Signed)
 Active and affecting quality of life Presently on finasteride  5 mg daily and tadalafil  5 mg daily Awaiting results of prostate MRI done on 06/16/2024 We will follow-up with urology
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
## 2024-06-20 NOTE — Assessment & Plan Note (Signed)
 Symptom management discussed Recommend frequent nasal saline irrigation Recommend Mucinex  D every 12 hours as needed Tylenol  and/or Advil  for headache management

## 2024-06-20 NOTE — Patient Instructions (Signed)

## 2024-06-20 NOTE — Assessment & Plan Note (Signed)
 Recurrent bacterial infection Symptom management discussed Recommend Augmentin  875 mg twice a day for 7 days May need longer duration depending on improvement May need ENT referral in the near future

## 2024-06-21 LAB — URINE CULTURE: Result:: NO GROWTH

## 2024-06-22 ENCOUNTER — Ambulatory Visit: Payer: Self-pay | Admitting: Emergency Medicine

## 2024-08-02 ENCOUNTER — Other Ambulatory Visit (HOSPITAL_COMMUNITY): Payer: Self-pay

## 2024-08-08 ENCOUNTER — Telehealth: Payer: Self-pay

## 2024-08-08 NOTE — Telephone Encounter (Signed)
 Forms received from [facility] on 08/03/2024 and fax back successfully on 08/08/2024

## 2024-08-14 NOTE — Progress Notes (Unsigned)
   LILLETTE Ileana Collet, PhD, LAT, ATC acting as a scribe for Artist Lloyd, MD.  Jorge Turner is a 68 y.o. male who presents to [facility] today for L arm pain x several months to a year. Pt locates pain to proximal portion of his L upper arm. He describes it as an aching, throbbing pain. Pain is bothersome at night. -n/t   He's been seen previously at [facility] and by Dr. Addie at [facility]  Radiates: yes Aggravates: nothing in particular, pain at rest Treatments tried:  prior CSI, cervical ESI, PT  Dx imaging: 09/26/23 L shoulder XR   Pertinent review of systems: No fevers or chills  Relevant historical information: CKD 3   Exam:  BP 122/80   Pulse (!) 52   Ht 5' 10 (1.778 m)   Wt 168 lb (76.2 kg)   SpO2 99%   BMI 24.11 kg/m  General: Well Developed, well nourished, and in no acute distress.   MSK: C-spine normal appearing normal cervical motion upper extremity strength is intact.  Left shoulder normal-appearing normal motion pain with abduction.  Positive Hawkins and Neer's test.  Positive empty can test. Strength is intact.    Lab and Radiology Results  Procedure: Real-time Ultrasound Guided Injection of left shoulder subacromial bursa Device: Philips Affiniti 50G/GE Logiq Images permanently stored and available for review in PACS Verbal informed consent obtained.  Discussed risks and benefits of procedure. Warned about infection, bleeding, hyperglycemia damage to structures among others. Patient expresses understanding and agreement Time-out conducted.   Noted no overlying erythema, induration, or other signs of local infection.   Skin prepped in a sterile fashion.   Local anesthesia: Topical Ethyl chloride.   With sterile technique and under real time ultrasound guidance: 40 mg of Kenalog  and 2 mL of Marcaine  injected into subacromial bursa. Fluid seen entering the bursa.   Completed without difficulty   Pain  immediately resolved suggesting accurate placement of the medication.   Advised to call if fevers/chills, erythema, induration, drainage, or persistent bleeding.   Images permanently stored and available for review in the ultrasound unit.  Impression: Technically successful ultrasound guided injection.        Assessment and Plan: 68 y.o. male with chronic left shoulder pain ongoing for months now.  [facility] but has failed to improve significantly.  X-ray was obtained last year and did not show severe abnormality.  Plan for subacromial injection today and continued PT and home exercise program.  If not improving patient will let me know and we can proceed to MRI.   PDMP not reviewed this encounter. Orders Placed This Encounter  Procedures   US  LIMITED JOINT SPACE STRUCTURES UP LEFT(NO LINKED CHARGES)    Reason for Exam (SYMPTOM  OR DIAGNOSIS REQUIRED):   left arm pain    Preferred imaging location?:   [facility]   No orders of the defined types were placed in this encounter.    Discussed warning signs or symptoms. Please see discharge instructions. Patient expresses understanding.   The above documentation has been reviewed and is accurate and complete Artist Lloyd, M.D.

## 2024-08-15 ENCOUNTER — Ambulatory Visit: Admitting: Family Medicine

## 2024-08-15 ENCOUNTER — Other Ambulatory Visit: Payer: Self-pay

## 2024-08-15 VITALS — BP 122/80 | HR 52 | Ht 70.0 in | Wt 168.0 lb

## 2024-08-15 DIAGNOSIS — G8929 Other chronic pain: Secondary | ICD-10-CM

## 2024-08-15 DIAGNOSIS — M25512 Pain in left shoulder: Secondary | ICD-10-CM

## 2024-08-15 NOTE — Patient Instructions (Addendum)
 Thank you for coming in today.   You received an injection today. Seek immediate medical attention if the joint becomes red, extremely painful, or is oozing fluid.   If not better, let me know and I will order a MRI to [facility].

## 2024-08-20 ENCOUNTER — Ambulatory Visit: Admitting: Orthopedic Surgery

## 2024-10-01 ENCOUNTER — Encounter: Payer: Self-pay | Admitting: Radiology

## 2024-10-18 NOTE — Progress Notes (Signed)
 [facility] [address] [place] [address] Phone: [phone]   Assessment and Plan:     1. Chronic left shoulder pain (Primary) -Chronic with exacerbation, subsequent visit - Patient continues to experience significant and worsening left shoulder pain.  Most consistent with rotator cuff pathology such as partial tearing versus tendinitis, the differential includes cervical radiculopathy with significant degenerative changes seen on prior C-spine MRI - Patient had decrease in severe pain after subacromial CSI on 08/15/2024, though he has continued to experience low levels of pain even after the injection, and pain has returned to preinjection levels within 6 weeks from injection - Recommend left shoulder MRI due to failure to improve despite 6 weeks of conservative therapy including physical therapy, CSI, pain >6/10, pain with day-to-day activities - Start prednisone  Dosepak    Pertinent previous records reviewed include none   Follow Up: 1 week after MRI to review results and discuss treatment plan.  If no significant findings on shoulder MRI, would consider cervical spine as primary pathology   Subjective:   I, Jorge Turner, am serving as a neurosurgeon for Doctor Morene Mace  Chief Complaint: left shoulder pain   HPI:   08/15/2024 Jorge Turner is a 68 y.o. male who presents to [facility] at [facility] today for L arm pain x several months to a year. Pt locates pain to proximal portion of his L upper arm. He describes it as an aching, throbbing pain. Pain is bothersome at night. -n/t    He's been seen previously at [facility] and by Dr. Addie at [facility]   Radiates: yes Aggravates: nothing in particular, pain at rest Treatments tried:  prior CSI, cervical ESI, PT   Dx imaging: 09/26/23 L shoulder XR    Pertinent review of systems: No fevers or chills   Relevant historical information:  CKD 3  10/19/2024 Patient states pain relief for a couple of months but pain has come back    Relevant Historical Information: CKD stage III  Additional pertinent review of systems negative.   Current Outpatient Medications:    methylPREDNISolone  (MEDROL  DOSEPAK) 4 MG TBPK tablet, Take 6 tablets on day 1.  Take 5 tablets on day 2.  Take 4 tablets on day 3.  Take 3 tablets on day 4.  Take 2 tablets on day 5.  Take 1 tablet on day 6., Disp: 21 tablet, Rfl: 0   acetaminophen  (TYLENOL ) 500 MG tablet, Take 500 mg by mouth every 6 (six) hours as needed., Disp: , Rfl:    cholecalciferol (VITAMIN D ) 1000 UNITS tablet, Take 1,000 Units by mouth daily., Disp: , Rfl:    clotrimazole -betamethasone  (LOTRISONE ) cream, Apply 1 Application topically 2 (two) times daily. Itching, rash, Disp: 90 g, Rfl: 1   Cyanocobalamin  (VITAMIN B-12) 1000 MCG SUBL, Place 1 tablet (1,000 mcg total) under the tongue daily., Disp: 100 tablet, Rfl: 3   famotidine  (PEPCID ) 40 MG tablet, Take 1 tablet (40 mg total) by mouth daily., Disp: 90 tablet, Rfl: 3   finasteride  (PROSCAR ) 5 MG tablet, TAKE 1 TABLET BY MOUTH EVERY DAY, Disp: 90 tablet, Rfl: 3   gabapentin  (NEURONTIN ) 100 MG capsule, Take 100 mg by mouth., Disp: , Rfl:    hydrOXYzine  (ATARAX ) 25 MG tablet, TAKE 1 TABLET BY MOUTH EVERY 8 HOURS AS NEEDED FOR ITCHING., Disp: 60 tablet, Rfl: 1   meclizine  (ANTIVERT ) 12.5 MG tablet, Take 1-2 tablets (12.5-25 mg total) by mouth 3 (  three) times daily as needed for dizziness., Disp: 40 tablet, Rfl: 0   pantoprazole (PROTONIX) 40 MG tablet, Take 40 mg by mouth daily., Disp: , Rfl:    rosuvastatin (CRESTOR) 10 MG tablet, Take 5 mg by mouth daily. Take one-half tablet by mouth once a day for cholesterol, Disp: , Rfl:    tadalafil  (CIALIS ) 5 MG tablet, Take 1 tablet (5 mg total) by mouth daily., Disp: 90 tablet, Rfl: 3   traMADol  (ULTRAM ) 50 MG tablet, Take 1 tablet (50 mg total) by mouth every 6 (six) hours as needed. (Patient not  taking: Reported on 08/15/2024), Disp: 20 tablet, Rfl: 1   Objective:     Vitals:   10/19/24 1303  BP: 108/72  Pulse: 72  SpO2: 100%  Weight: 162 lb (73.5 kg)  Height: 5' 10 (1.778 m)      Body mass index is 23.24 kg/m.    Physical Exam:    Gen: Appears well, nad, nontoxic and pleasant Neuro:sensation intact, strength is 5/5, muscle tone wnl Skin: no suspicious lesion or defmority Psych: A&O, appropriate mood and affect  Left shoulder:  No deformity, swelling or muscle wasting No scapular winging FF 60, abd 50, int 30, ext 60 NTTP over the [place], clavicle, ac, coracoid, biceps groove, humerus, deltoid, trapezius, cervical spine Special testing limited due to limited ROM Negative Spurling's test bilat FROM of neck    Electronically signed by:  [name] [facility] 1:58 PM 10/19/24

## 2024-10-19 ENCOUNTER — Ambulatory Visit: Admitting: Sports Medicine

## 2024-10-19 VITALS — BP 108/72 | HR 72 | Ht 70.0 in | Wt 162.0 lb

## 2024-10-19 DIAGNOSIS — G8929 Other chronic pain: Secondary | ICD-10-CM | POA: Diagnosis not present

## 2024-10-19 DIAGNOSIS — M25512 Pain in left shoulder: Secondary | ICD-10-CM | POA: Diagnosis not present

## 2024-10-19 MED ORDER — METHYLPREDNISOLONE 4 MG PO TBPK: ORAL_TABLET | ORAL | 0 refills | Status: AC

## 2024-10-19 NOTE — Patient Instructions (Signed)
 Mri left shoulder   Prednisone  dos pak   Follow up 1 week after to discuss results

## 2024-10-22 ENCOUNTER — Other Ambulatory Visit: Payer: Self-pay | Admitting: Internal Medicine

## 2024-11-10 ENCOUNTER — Inpatient Hospital Stay: Admission: RE | Admit: 2024-11-10 | Discharge: 2024-11-10 | Attending: Sports Medicine

## 2024-11-10 DIAGNOSIS — G8929 Other chronic pain: Secondary | ICD-10-CM

## 2024-11-12 ENCOUNTER — Ambulatory Visit: Payer: Self-pay | Admitting: Sports Medicine

## 2024-11-14 NOTE — Progress Notes (Unsigned)
 [facility] [address] [place] [address] Phone: [phone]   Assessment and Plan:     1. Chronic left shoulder pain (Primary) 2. Primary osteoarthritis of left shoulder 3. Tendinopathy of left rotator cuff -Chronic with exacerbation, subsequent visit - Continued left shoulder pain most consistent with tendinopathy and partial tearing of supraspinatus, high-grade partial-thickness cartilage loss of glenohumeral joint - Patient had overall decrease in pain after subacromial CSI on 08/15/2024, however pain never fully resolved and returned within 6 weeks - Patient elected for intra-articular shoulder CSI.  Tolerated well per note below - Patient has recently completed prednisone  Dosepak without significant relief of symptoms.  Do not recommend restarting prednisone  course at this time - Use Tylenol  500 to 1000 mg tablets 2-3 times a day for day-to-day pain relief   Procedure: Ultrasound Guided Glenohumeral Joint Injection Side: Left Diagnosis: Flare of osteoarthritis US  Indication:  - accuracy is paramount for diagnosis - to ensure therapeutic efficacy or procedural success - to reduce procedural risk  After explaining the procedure, viable alternatives, risks, and answering any questions, consent was given verbally. The site was cleaned with chlorhexidine  prep. An ultrasound transducer was placed on the posterior shoulder.  The posterior capsule, labrum, and infraspinatus were identified.  A steroid injection was performed under ultrasound guidance with sterile technique using  2ml of 1% lidocaine  without epinephrine and 40 mg of triamcinolone  (KENALOG ) 40mg /ml. This was well tolerated and resulted in  relief.  Needle was removed and dressing placed and post injection instructions were given including  a discussion of likely return of pain today after the anesthetic wears off (with the possibility of worsened pain) until the  steroid starts to work in 1-3 days.   Pt was advised to call or return to clinic if these symptoms worsen or fail to improve as anticipated.   Images permanently stored.     Pertinent previous records reviewed include left shoulder MRI 11/10/2024 IMPRESSION: 1. Moderate supraspinatus tendinosis with a small high-grade partial-thickness bursal surface tear along the anterior insertion. 2. Mild infraspinatus tendinosis. 3. High-grade partial-thickness cartilage loss of the glenohumeral joint.   Follow Up: 4 to 6 weeks for reevaluation.  Could consider orthopedic surgery referral versus further investigation of C-spine for possible cervical radiculopathy   Subjective:   I, Jorge Turner, am serving as a neurosurgeon for Doctor Jorge Turner   Chief Complaint: left shoulder pain    HPI:    08/15/2024 Jorge Turner is a 68 y.o. male who presents to [facility] at [facility] today for L arm pain x several months to a year. Pt locates pain to proximal portion of his L upper arm. He describes it as an aching, throbbing pain. Pain is bothersome at night. -n/t    He's been seen previously at [facility] and by Jorge Turner at [facility]   Radiates: yes Aggravates: nothing in particular, pain at rest Treatments tried:  prior CSI, cervical ESI, PT   Dx imaging: 09/26/23 L shoulder XR    Pertinent review of systems: No fevers or chills   Relevant historical information: CKD 3   10/19/2024 Patient states pain relief for a couple of months but pain has come back    11/15/2024 Patient states his pain has gotten a little more annoying   Relevant Historical Information: CKD stage III  Additional pertinent review of systems negative.  Current Medications[1]   Objective:     Vitals:  11/15/24 1036  Pulse: 65  SpO2: 98%  Weight: 161 lb (73 kg)  Height: 5' 10 (1.778 m)      Body mass index is 23.1 kg/m.    Physical Exam:    Gen: Appears well, nad,  nontoxic and pleasant Neuro:sensation intact, strength is 5/5, muscle tone wnl Skin: no suspicious lesion or defmority Psych: A&O, appropriate mood and affect   Left shoulder:  No deformity, swelling or muscle wasting No scapular winging FF 60, abd 50, int 30, ext 60 NTTP over the [place], clavicle, ac, coracoid, biceps groove, humerus, deltoid, trapezius, cervical spine Special testing limited due to limited ROM Negative Spurling's test bilat FROM of neck     Electronically signed by:  Jorge Turner [facility] 10:53 AM 11/15/2024     [1]  Current Outpatient Medications:    acetaminophen  (TYLENOL ) 500 MG tablet, Take 500 mg by mouth every 6 (six) hours as needed., Disp: , Rfl:    cholecalciferol (VITAMIN D ) 1000 UNITS tablet, Take 1,000 Units by mouth daily., Disp: , Rfl:    clotrimazole -betamethasone  (LOTRISONE ) cream, Apply 1 Application topically 2 (two) times daily. Itching, rash, Disp: 90 g, Rfl: 1   Cyanocobalamin  (VITAMIN B-12) 1000 MCG SUBL, Place 1 tablet (1,000 mcg total) under the tongue daily., Disp: 100 tablet, Rfl: 3   famotidine  (PEPCID ) 40 MG tablet, Take 1 tablet (40 mg total) by mouth daily., Disp: 90 tablet, Rfl: 3   finasteride  (PROSCAR ) 5 MG tablet, TAKE 1 TABLET BY MOUTH EVERY DAY, Disp: 90 tablet, Rfl: 3   gabapentin  (NEURONTIN ) 100 MG capsule, Take 100 mg by mouth., Disp: , Rfl:    hydrOXYzine  (ATARAX ) 25 MG tablet, TAKE 1 TABLET BY MOUTH EVERY 8 HOURS AS NEEDED FOR ITCHING., Disp: 60 tablet, Rfl: 1   meclizine  (ANTIVERT ) 12.5 MG tablet, Take 1-2 tablets (12.5-25 mg total) by mouth 3 (three) times daily as needed for dizziness., Disp: 40 tablet, Rfl: 0   methylPREDNISolone  (MEDROL  DOSEPAK) 4 MG TBPK tablet, Take 6 tablets on day 1.  Take 5 tablets on day 2.  Take 4 tablets on day 3.  Take 3 tablets on day 4.  Take 2 tablets on day 5.  Take 1 tablet on day 6., Disp: 21 tablet, Rfl: 0   pantoprazole (PROTONIX) 40 MG tablet, Take 40 mg by mouth  daily., Disp: , Rfl:    rosuvastatin (CRESTOR) 10 MG tablet, Take 5 mg by mouth daily. Take one-half tablet by mouth once a day for cholesterol, Disp: , Rfl:    tadalafil  (CIALIS ) 5 MG tablet, TAKE 1 TABLET (5 MG TOTAL) BY MOUTH DAILY., Disp: 90 tablet, Rfl: 3   traMADol  (ULTRAM ) 50 MG tablet, Take 1 tablet (50 mg total) by mouth every 6 (six) hours as needed. (Patient not taking: Reported on 08/15/2024), Disp: 20 tablet, Rfl: 1

## 2024-11-15 ENCOUNTER — Ambulatory Visit: Admitting: Sports Medicine

## 2024-11-15 ENCOUNTER — Other Ambulatory Visit: Payer: Self-pay

## 2024-11-15 VITALS — HR 65 | Ht 70.0 in | Wt 161.0 lb

## 2024-11-15 DIAGNOSIS — M25512 Pain in left shoulder: Secondary | ICD-10-CM

## 2024-11-15 DIAGNOSIS — M19012 Primary osteoarthritis, left shoulder: Secondary | ICD-10-CM

## 2024-11-15 DIAGNOSIS — M67912 Unspecified disorder of synovium and tendon, left shoulder: Secondary | ICD-10-CM | POA: Diagnosis not present

## 2024-11-15 DIAGNOSIS — G8929 Other chronic pain: Secondary | ICD-10-CM

## 2024-11-15 NOTE — Patient Instructions (Signed)
 Thank you for coming in today  - Use Tylenol  500 to 1000 mg tablets 2-3 times a day for day-to-day pain relief  Continue home exercises for shoulder  Follow-up in 4 weeks for reevaluation

## 2024-12-12 NOTE — Progress Notes (Signed)
 "               Jorge Turner D.[facility] [address] [place] [address] Phone: [phone]   Assessment and Plan:     1. Chronic left shoulder pain (Primary) 2. Primary osteoarthritis of left shoulder 3. Tendinopathy of left rotator cuff -Chronic with exacerbation, subsequent visit - Continued and recurrent left shoulder pain, still most consistent with MRI findings including tendinopathy, partial tearing of supraspinatus, high-grade partial-thickness cartilage loss of glenohumeral joint - Patient had 2 weeks relief after intra-articular CSI on 11/15/2024, however pain has returned since that time.  Patient experienced only partial relief from subacromial CSI on 08/15/2024 - Based on MRI findings, incomplete and short lasting relief with CSI intra-articular and subacromial, recommend further evaluation with orthopedic surgery.  Referral sent - Do not recommend NSAID use due to history of CKD with last GFR 38 in May 2025  Pertinent previous records reviewed include none   Follow Up: As needed if no improvement or worsening of symptoms.  Could consider repeat subacromial versus intra-articular CSI to provide temporary relief if needed   Subjective:   I, Jorge Turner, am serving as a neurosurgeon for Doctor Morene Turner   Chief Complaint: left shoulder pain    HPI:    08/15/2024 Jorge Turner is a 69 y.o. male who presents to [facility] at [facility] today for L arm pain x several months to a year. Pt locates pain to proximal portion of his L upper arm. He describes it as an aching, throbbing pain. Pain is bothersome at night. -n/t    He's been seen previously at [facility] and by Dr. Addie at [facility]   Radiates: yes Aggravates: nothing in particular, pain at rest Treatments tried:  prior CSI, cervical ESI, PT   Dx imaging: 09/26/23 L shoulder XR    Pertinent review of systems: No fevers or chills   Relevant historical  information: CKD 3   10/19/2024 Patient states pain relief for a couple of months but pain has come back    11/15/2024 Patient states his pain has gotten a little more annoying   12/13/2024 Patient states shot helped for a little. 2 weeks ago he had a PT re-evaluation and he has been in pain (worse) since.    Relevant Historical Information: CKD stage III  Additional pertinent review of systems negative.  Current Medications[1]   Objective:     Vitals:   12/13/24 0946  Pulse: 85  SpO2: 100%  Weight: 161 lb (73 kg)  Height: 5' 10 (1.778 m)      Body mass index is 23.1 kg/m.    Physical Exam:    Gen: Appears well, nad, nontoxic and pleasant Neuro:sensation intact, strength is 5/5, muscle tone wnl Skin: no suspicious lesion or defmority Psych: A&O, appropriate mood and affect   Left shoulder:  No deformity, swelling or muscle wasting No scapular winging FF 60, abd 50, int 30, ext 60 NTTP over the [place], clavicle, ac, coracoid, biceps groove, humerus, deltoid, trapezius, cervical spine Special testing limited due to limited ROM Negative Spurling's test bilat FROM of neck     Electronically signed by:  Jorge Turner D.[facility] 10:00 AM 12/13/24     [1]  Current Outpatient Medications:    acetaminophen  (TYLENOL ) 500 MG tablet, Take 500 mg by mouth every 6 (six) hours as needed., Disp: , Rfl:    cholecalciferol (VITAMIN D ) 1000  UNITS tablet, Take 1,000 Units by mouth daily., Disp: , Rfl:    clotrimazole -betamethasone  (LOTRISONE ) cream, Apply 1 Application topically 2 (two) times daily. Itching, rash, Disp: 90 g, Rfl: 1   Cyanocobalamin  (VITAMIN B-12) 1000 MCG SUBL, Place 1 tablet (1,000 mcg total) under the tongue daily., Disp: 100 tablet, Rfl: 3   famotidine  (PEPCID ) 40 MG tablet, Take 1 tablet (40 mg total) by mouth daily., Disp: 90 tablet, Rfl: 3   finasteride  (PROSCAR ) 5 MG tablet, TAKE 1 TABLET BY MOUTH EVERY DAY, Disp: 90 tablet, Rfl:  3   gabapentin  (NEURONTIN ) 100 MG capsule, Take 100 mg by mouth., Disp: , Rfl:    hydrOXYzine  (ATARAX ) 25 MG tablet, TAKE 1 TABLET BY MOUTH EVERY 8 HOURS AS NEEDED FOR ITCHING., Disp: 60 tablet, Rfl: 1   meclizine  (ANTIVERT ) 12.5 MG tablet, Take 1-2 tablets (12.5-25 mg total) by mouth 3 (three) times daily as needed for dizziness., Disp: 40 tablet, Rfl: 0   methylPREDNISolone  (MEDROL  DOSEPAK) 4 MG TBPK tablet, Take 6 tablets on day 1.  Take 5 tablets on day 2.  Take 4 tablets on day 3.  Take 3 tablets on day 4.  Take 2 tablets on day 5.  Take 1 tablet on day 6., Disp: 21 tablet, Rfl: 0   pantoprazole (PROTONIX) 40 MG tablet, Take 40 mg by mouth daily., Disp: , Rfl:    rosuvastatin (CRESTOR) 10 MG tablet, Take 5 mg by mouth daily. Take one-half tablet by mouth once a day for cholesterol, Disp: , Rfl:    tadalafil  (CIALIS ) 5 MG tablet, TAKE 1 TABLET (5 MG TOTAL) BY MOUTH DAILY., Disp: 90 tablet, Rfl: 3   traMADol  (ULTRAM ) 50 MG tablet, Take 1 tablet (50 mg total) by mouth every 6 (six) hours as needed. (Patient not taking: Reported on 08/15/2024), Disp: 20 tablet, Rfl: 1  "

## 2024-12-13 ENCOUNTER — Ambulatory Visit: Admitting: Sports Medicine

## 2024-12-13 VITALS — HR 85 | Ht 70.0 in | Wt 161.0 lb

## 2024-12-13 DIAGNOSIS — M19012 Primary osteoarthritis, left shoulder: Secondary | ICD-10-CM

## 2024-12-13 DIAGNOSIS — M25512 Pain in left shoulder: Secondary | ICD-10-CM | POA: Diagnosis not present

## 2024-12-13 DIAGNOSIS — G8929 Other chronic pain: Secondary | ICD-10-CM

## 2024-12-13 DIAGNOSIS — M67912 Unspecified disorder of synovium and tendon, left shoulder: Secondary | ICD-10-CM

## 2024-12-13 NOTE — Patient Instructions (Signed)
 Tylenol  [phone] mg 2-3 times a day for pain relief   Ortho referral   As needed follow up

## 2024-12-31 ENCOUNTER — Ambulatory Visit: Admitting: Orthopedic Surgery

## 2025-01-18 ENCOUNTER — Ambulatory Visit: Admitting: Orthopedic Surgery
